# Patient Record
Sex: Female | Born: 1945 | Race: White | Hispanic: No | Smoking: Former smoker
Health system: Southern US, Community
[De-identification: ages and names within clinical notes are randomized; demographics above are authoritative.]

## PROBLEM LIST (undated history)

## (undated) DIAGNOSIS — Z950 Presence of cardiac pacemaker: Secondary | ICD-10-CM

## (undated) DIAGNOSIS — I4821 Permanent atrial fibrillation: Secondary | ICD-10-CM

## (undated) DIAGNOSIS — I10 Essential (primary) hypertension: Secondary | ICD-10-CM

## (undated) DIAGNOSIS — N181 Chronic kidney disease, stage 1: Secondary | ICD-10-CM

## (undated) DIAGNOSIS — I639 Cerebral infarction, unspecified: Secondary | ICD-10-CM

## (undated) DIAGNOSIS — G2581 Restless legs syndrome: Secondary | ICD-10-CM

## (undated) DIAGNOSIS — I739 Peripheral vascular disease, unspecified: Secondary | ICD-10-CM

## (undated) DIAGNOSIS — D72829 Elevated white blood cell count, unspecified: Secondary | ICD-10-CM

## (undated) DIAGNOSIS — I080 Rheumatic disorders of both mitral and aortic valves: Secondary | ICD-10-CM

## (undated) DIAGNOSIS — I509 Heart failure, unspecified: Secondary | ICD-10-CM

## (undated) HISTORY — DX: Restless legs syndrome: G25.81

## (undated) HISTORY — DX: Permanent atrial fibrillation: I48.21

## (undated) HISTORY — PX: TONSILLECTOMY: SUR1361

## (undated) HISTORY — DX: Essential (primary) hypertension: I10

## (undated) HISTORY — PX: MELANOMA EXCISION: SHX5266

## (undated) HISTORY — DX: Rheumatic disorders of both mitral and aortic valves: I08.0

## (undated) HISTORY — PX: OTHER SURGICAL HISTORY: SHX169

## (undated) HISTORY — DX: Elevated white blood cell count, unspecified: D72.829

## (undated) HISTORY — DX: Presence of cardiac pacemaker: Z95.0

## (undated) HISTORY — DX: Peripheral vascular disease, unspecified: I73.9

## (undated) HISTORY — PX: CHOLECYSTECTOMY: SHX55

## (undated) HISTORY — PX: APPENDECTOMY: SHX54

## (undated) HISTORY — DX: Chronic kidney disease, stage 1: N18.1

## (undated) HISTORY — PX: NASAL SINUS SURGERY: SHX719

## (undated) HISTORY — PX: INSERT / REPLACE / REMOVE PACEMAKER: SUR710

## (undated) HISTORY — PX: MITRAL VALVE REPLACEMENT: SHX147

## (undated) HISTORY — DX: Heart failure, unspecified: I50.9

---

## 2010-02-02 ENCOUNTER — Ambulatory Visit: Payer: Self-pay | Admitting: Family Medicine

## 2010-03-05 ENCOUNTER — Ambulatory Visit: Payer: Self-pay | Admitting: Family Medicine

## 2010-03-09 ENCOUNTER — Inpatient Hospital Stay: Payer: Self-pay | Admitting: *Deleted

## 2010-03-09 ENCOUNTER — Encounter: Payer: Self-pay | Admitting: Cardiovascular Disease

## 2010-03-13 ENCOUNTER — Ambulatory Visit: Payer: Self-pay | Admitting: *Deleted

## 2010-05-19 ENCOUNTER — Ambulatory Visit: Payer: Self-pay | Admitting: Cardiovascular Disease

## 2010-05-19 ENCOUNTER — Inpatient Hospital Stay: Payer: Self-pay | Admitting: Internal Medicine

## 2010-05-20 ENCOUNTER — Encounter: Payer: Self-pay | Admitting: Cardiovascular Disease

## 2010-05-27 ENCOUNTER — Ambulatory Visit: Payer: Self-pay | Admitting: Oncology

## 2010-06-08 ENCOUNTER — Ambulatory Visit: Payer: Self-pay | Admitting: Cardiovascular Disease

## 2010-06-08 DIAGNOSIS — I4891 Unspecified atrial fibrillation: Secondary | ICD-10-CM | POA: Insufficient documentation

## 2010-06-08 DIAGNOSIS — I050 Rheumatic mitral stenosis: Secondary | ICD-10-CM

## 2010-06-15 ENCOUNTER — Ambulatory Visit: Payer: Self-pay | Admitting: Oncology

## 2010-06-15 ENCOUNTER — Encounter: Payer: Self-pay | Admitting: Cardiovascular Disease

## 2010-06-26 ENCOUNTER — Ambulatory Visit: Payer: Self-pay | Admitting: Oncology

## 2010-08-18 ENCOUNTER — Emergency Department: Payer: Self-pay | Admitting: Emergency Medicine

## 2010-10-01 ENCOUNTER — Encounter: Payer: Self-pay | Admitting: Internal Medicine

## 2010-10-01 ENCOUNTER — Ambulatory Visit
Admission: RE | Admit: 2010-10-01 | Discharge: 2010-10-01 | Payer: Self-pay | Source: Home / Self Care | Attending: Internal Medicine | Admitting: Internal Medicine

## 2010-10-03 DIAGNOSIS — Z950 Presence of cardiac pacemaker: Secondary | ICD-10-CM | POA: Insufficient documentation

## 2010-10-26 NOTE — Letter (Signed)
Summary: [facility] Hematology Note   [facility] Hematology Note   Imported By: Roderic Ovens 07/12/2010 11:04:29  _____________________________________________________________________  External Attachment:    Type:   Image     Comment:   External Document

## 2010-10-26 NOTE — Letter (Signed)
Summary: PHI  PHI   Imported By: Harlon Flor 06/14/2010 13:37:50  _____________________________________________________________________  External Attachment:    Type:   Image     Comment:   External Document

## 2010-10-26 NOTE — Assessment & Plan Note (Signed)
Summary: NP6/AMD   Visit Type:  Initial Consult Primary Rhonda Griffith:  [name]  CC:  F/U [facility].  She is at a nursing home now for falling.Rhonda Griffith  History of Present Illness: [name] is a pleasant 65 year old woman with a history of chronic atrial fibrillation, pacemaker, mitral valve replacement with bovine valve who I first met in the hospital on August 26. She had a fall with recent history of falls with a very large hematoma on her left leg, history of recent GI bleeding, left arm fracture. At that time we decided to discontinue her Coumadin. Her INR at that time was more than 4.  She presents today for routine followup after discontinuation of her warfarin. Overall she states that she is doing well. Her hematoma is slowly improving bili continues to be quite large. She is ambulating with a walker her gait is still unstable. She denies any recent falls.  Notes indicate a history of sick sinus syndrome for which a pacemaker was placed. Also a history of rheumatic valve disease, status post bovine mitral valve replacement.  EKG today shows features fibrillation with paced rhythm ventricular rate of 85 beats per minute  Current Medications (verified): 1)  Requip 2 Mg Tabs (Ropinirole Hcl) .... One Tablet At Bedtime 2)  Flexeril 10 Mg Tabs (Cyclobenzaprine Hcl) .... Three Times A Day As Needed For Muscle Spasm. 3)  Zoloft 50 Mg Tabs (Sertraline Hcl) .... One Tablet Once Daily 4)  Cardizem Cd 300 Mg Xr24h-Cap (Diltiazem Hcl Coated Beads) .... One Tablet Once Daily 5)  Tylenol 325 Mg Tabs (Acetaminophen) .... As Needed 6)  Omeprazole 20 Mg Cpdr (Omeprazole) .... One Tablet Two Times A Day 7)  Ferrous Sulfate 325 (65 Fe) Mg Tbec (Ferrous Sulfate) .... One Tablet Once Daily 8)  Robitussin Cough/cold Cf 5-10-100 Mg/82ml Liqd (Phenylephrine-Dm-Gg) .... As Needed For Cough 9)  Septra Ds 800-160 Mg Tabs (Sulfamethoxazole-Trimethoprim) .... One Two Times A Day 10)  Diflucan 150mg  .... One Once  Daily 11)  Pyridium 200 Mg Tabs (Phenazopyridine Hcl) .... One Tablet As Needed  Allergies (verified): 1)  ! Morphine  Past History:  Past Medical History: Last updated: 06/08/2010 chronic A-fib on Coumadin rheumatic mitral valve, s/p mechanical valve replaced with bovine valve 2010 restless leg syndrome pacemaker implant for SSS leukocytosis chronic kidney disease, stage I  Past Surgical History: Last updated: 06/08/2010 Pacemaker Implant  rheumatic mitral valve, s/p mechanical valve replaced with bovine valve 2010.  Family History: Last updated: 06/08/2010 Family History of Hypertension:   Social History: Last updated: 06/08/2010 Married  Tobacco Use - Former.  Alcohol Use - no Drug Use - no  Risk Factors: Smoking Status: quit (06/08/2010)  Review of Systems       The patient complains of difficulty walking.  The patient denies fever, weight loss, weight gain, vision loss, decreased hearing, hoarseness, chest pain, syncope, dyspnea on exertion, peripheral edema, prolonged cough, abdominal pain, incontinence, muscle weakness, depression, and enlarged lymph nodes.    Vital Signs:  Patient profile:   65 year old female Height:      69 inches Weight:      180.25 pounds BMI:     26.71 Pulse rate:   85 / minute BP sitting:   154 / 75  (left arm) Cuff size:   regular  Vitals Entered By: Bishop Dublin, CMA (June 08, 2010 3:50 PM)  Physical Exam  General:  Well developed, well nourished, in no acute distress. Head:  normocephalic and atraumatic Neck:  Neck supple, no JVD. No masses, thyromegaly or abnormal cervical nodes. Lungs:  Clear bilaterally to auscultation and percussion. Heart:  Non-displaced PMI, chest non-tender; regular rate and rhythm, S1, S2 without murmurs, rubs or gallops. Carotid upstroke normal, no bruit.  Pedals normal pulses. No edema, no varicosities. Abdomen:   abdomen soft and non-tender without masses Msk:  Back normal, normal gait.  Muscle strength and tone normal. Walks with a walker Pulses:  pulses normal in all 4 extremities Extremities:  No clubbing or cyanosis. Neurologic:  Alert and oriented x 3. Skin:  Intact without lesions or rashes. Psych:  Normal affect.   Impression & Recommendations:  Problem # 1:  ATRIAL FIBRILLATION (ICD-427.31) chronic/permanent atrial fibrillation. She is no longer on warfarin secondary to frequent falls, large hematoma and hospitalization, GI bleed. we will start aspirin 81 mg daily. We will continue her on Cardizem as her rate is reasonably well controlled.  Her updated medication list for this problem includes:    Aspirin 81 Mg Tbec (Aspirin) .Rhonda Griffith... 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 We will repeat an echocardiogram annually to evaluate her valve. There is no indication for warfarin with a porcine valve.  Patient Instructions: 1)  Your physician has recommended you make the following change in your medication: START aspirin 81 mg daily  2)  Your physician wants you to follow-up in:   6 months You will receive a reminder letter in the mail two months in advance. If you don't receive a letter, please call our office to schedule the follow-up appointment.

## 2010-10-28 NOTE — Cardiovascular Report (Signed)
Summary: Office Visit   Office Visit   Imported By: Roderic Ovens 10/19/2010 13:45:08  _____________________________________________________________________  External Attachment:    Type:   Image     Comment:   External Document

## 2010-10-28 NOTE — Assessment & Plan Note (Signed)
Summary: NEP/AMD   Visit Type:  Initial Consult Referring Provider:  Tim Gollan,M.D. Primary Provider:  [name]  CC:  Est. Care with EP.  Denies chest pain or shortness of breath..  History of Present Illness: Mrs. Poteet is referred today for initial PPM evaluation.  She is a 65 yo woman with a h/o atrial fib, MV replacement X2, HTN, and recurrent falls. She fell and fractured her arm several months ago and currently has a hematoma. No other complaints. She denies palpitations.  Current Medications (verified): 1)  Requip 2 Mg Tabs (Ropinirole Hcl) .... One Tablet At Bedtime 2)  Flexeril 10 Mg Tabs (Cyclobenzaprine Hcl) .... Three Times A Day As Needed For Muscle Spasm. 3)  Zoloft 50 Mg Tabs (Sertraline Hcl) .... One Tablet Once Daily 4)  Cardizem Cd 300 Mg Xr24h-Cap (Diltiazem Hcl Coated Beads) .... One Tablet Once Daily 5)  Tylenol 325 Mg Tabs (Acetaminophen) .... As Needed 6)  Omeprazole 20 Mg Cpdr (Omeprazole) .... One Tablet Two Times A Day 7)  Ferrous Sulfate 325 (65 Fe) Mg Tbec (Ferrous Sulfate) .... One Tablet Once Daily 8)  Robitussin Cough/cold Cf 5-10-100 Mg/22ml Liqd (Phenylephrine-Dm-Gg) .... As Needed For Cough 9)  Septra Ds 800-160 Mg Tabs (Sulfamethoxazole-Trimethoprim) .... One Two Times A Day 10)  Diflucan 150mg  .... One Once Daily 11)  Pyridium 200 Mg Tabs (Phenazopyridine Hcl) .... One Tablet As Needed 12)  Aspirin 81 Mg Tbec (Aspirin) .... Take One Tablet By Mouth Daily  Allergies (verified): 1)  ! Morphine  Past History:  Past Medical History: Last updated: 06/08/2010 chronic A-fib on Coumadin rheumatic mitral valve, s/p mechanical valve replaced with bovine valve 2010 restless leg syndrome pacemaker implant for SSS leukocytosis chronic kidney disease, stage I  Past Surgical History: Last updated: 06/08/2010 Pacemaker Implant  rheumatic mitral valve, s/p mechanical valve replaced with bovine valve 2010.  Family History: Last updated:  06/08/2010 Family History of Hypertension:   Social History: Last updated: 06/08/2010 Married  Tobacco Use - Former.  Alcohol Use - no Drug Use - no  Risk Factors: Smoking Status: quit (06/08/2010)  Review of Systems       All systems reviewed and negative except as noted in the HPI.  Vital Signs:  Patient profile:   65 year old female Height:      69 inches Weight:      1170 pounds BMI:     173.40 Pulse rate:   85 / minute BP sitting:   120 / 72  (left arm) Cuff size:   regular  Vitals Entered By: Bishop Dublin, CMA (October 01, 2010 10:03 AM)  Physical Exam  General:  Well developed, well nourished, in no acute distress. Head:  normocephalic and atraumatic Neck:  Neck supple, no JVD. No masses, thyromegaly or abnormal cervical nodes. Chest Wall:  Well healed PPM incision. Lungs:  Clear bilaterally to auscultation with no wheezes, rales, or rhonchi. Heart:  Non-displaced PMI, chest non-tender; regular rate and rhythm, S1, S2 without murmurs, rubs or gallops. Carotid upstroke normal, no bruit.  Pedals normal pulses. No edema, no varicosities. Abdomen:   abdomen soft and non-tender without masses Msk:  Back normal, normal gait. Muscle strength and tone normal. Walks with a walker Pulses:  pulses normal in all 4 extremities Extremities:  No clubbing or cyanosis. Neurologic:  Alert and oriented x 3.   PPM Specifications Following MD:  Lewayne Bunting, MD     Referring MD:  Georgeanna Lea PPM Vendor:  [employer]  PPM Model Number:  [id number]     PPM Serial Number:  [id number] PPM DOI:  12/11/2007     PPM Implanting MD:  NOT IMPLANTED BY [place]  Lead 1    Location: RV     DOI: 12/11/2007     Model #: [id number]     Serial #: [id number]     Status: active  Magnet Response Rate:  BOL 85 ERI 65  Indications:  Sick sinus syndrome   PPM Follow Up Remote Check?  No Battery Voltage:  2.79 V     Battery Est. Longevity:  8 years     Right Ventricle  Amplitude: 8.0 mV, Impedance: 331  ohms, Threshold: 0.5 V at 0.4 msec  Episodes Coumadin:  No Ventricular Pacing:  90.5%  Parameters Mode:  VVIR     Lower Rate Limit:  60     Upper Rate Limit:  120 Next Cardiology Appt Due:  03/27/2011 Tech Comments:  No parameter changes.  A-fib with controlled ventricular response.  She is not on coumadin because of her risk for fall.  Device function normal.  No Carelink @ this time.  ROV 6 months [place] [facility]. Altha Harm, LPN  October 01, 2010 10:18 AM  MD Comments:  Agree with above.  Impression & Recommendations:  Problem # 1:  CARDIAC PACEMAKER IN SITU (ICD-V45.01) Her PPM is working normally. Continue followup in several months.  Problem # 2:  ATRIAL FIBRILLATION (ICD-427.31) Her ventricular rates are well controlled. Will follow. Her updated medication list for this problem includes:    Aspirin 81 Mg Tbec (Aspirin) .[name]... Take one tablet by mouth daily  Patient Instructions: 1)  Your physician recommends that you schedule a follow-up appointment in: 1 year 2)  Your physician recommends that you continue on your current medications as directed. Please refer to the Current Medication list given to you today.

## 2010-12-11 ENCOUNTER — Ambulatory Visit: Payer: Self-pay | Admitting: Internal Medicine

## 2010-12-29 ENCOUNTER — Ambulatory Visit (INDEPENDENT_AMBULATORY_CARE_PROVIDER_SITE_OTHER): Payer: Medicare Other | Admitting: Cardiovascular Disease

## 2010-12-29 ENCOUNTER — Encounter: Payer: Self-pay | Admitting: Cardiovascular Disease

## 2010-12-29 DIAGNOSIS — Z950 Presence of cardiac pacemaker: Secondary | ICD-10-CM

## 2010-12-29 DIAGNOSIS — I4891 Unspecified atrial fibrillation: Secondary | ICD-10-CM

## 2010-12-29 DIAGNOSIS — I050 Rheumatic mitral stenosis: Secondary | ICD-10-CM

## 2010-12-29 MED ORDER — WARFARIN SODIUM 2 MG PO TABS: 1.0000 mg | ORAL_TABLET | Freq: Every day | ORAL | Status: DC

## 2010-12-29 NOTE — Assessment & Plan Note (Signed)
Chronic atrial fibrillation, paced rhythm. We have talked to her again about anticoagulation. As she has had no falls, I have encouraged her to start a low dose warfarin. Previously, she was on 2.5 mg alternating with 5 mg several times a week. We could start with 1 mg a day, titrating up slowly with goal INR of 1.8-2. She would like to start slowly given her history of falls. No INR check would be needed with 1 mg daily.

## 2010-12-29 NOTE — Progress Notes (Signed)
   Patient ID: Rhonda Griffith, female    DOB: Nov 26, 1945, 65 y.o.   MRN: [id number]  HPI Comments: Rhonda Griffith is a pleasant 65 year old woman with a history of chronic atrial fibrillation, pacemaker, history of rheumatic valve disease with mitral valve replacement with bovine valve who I first met in the hospital on August 26. She had a fall with recent history of falls with a very large hematoma on her left leg, history of recent GI bleeding, left arm fracture. At that time we decided to discontinue her Coumadin. Her INR at that time was more than 4. Notes indicate a history of sick sinus syndrome for which a pacemaker was placed.    She presents today for routine followup. She has been having followup for her poor balance and coordination issues. She had a spinal tap at [facility] for high pressure in her ventricle. She reports no significant improvement after this procedure.  She is scheduled to have a followup with a neurologist who specializes in motor coordination. She uses her walker. She continues to be a fall risk though no recent falls per her family. She does have some memory problems and problems with incontinence.   EKG today shows features atrial  fibrillation with paced rhythm ventricular rate of 73 beats per minute      Review of Systems  HENT: Negative.   Eyes: Negative.   Respiratory: Negative.   Cardiovascular: Negative.   Gastrointestinal: Negative.   Musculoskeletal: Negative.   Skin: Negative.   Neurological: Positive for weakness.       Problems with motor coordination and gait.  Hematological: Negative.   Psychiatric/Behavioral: Negative.   All other systems reviewed and are negative.    BP 120/72  Pulse 73  Ht 5\' 9"  (1.753 m)  Wt 176 lb (79.833 kg)  BMI 25.99 kg/m2   Physical Exam  Nursing note and vitals reviewed. Constitutional: She is oriented to person, place, and time. She appears well-developed and well-nourished.  HENT:  Head: Normocephalic.    Nose: Nose normal.  Mouth/Throat: Oropharynx is clear and moist.  Eyes: Conjunctivae are normal. Pupils are equal, round, and reactive to light.  Neck: Normal range of motion. Neck supple. No JVD present.  Cardiovascular: Normal rate, regular rhythm, normal heart sounds and intact distal pulses.  Exam reveals no gallop and no friction rub.   No murmur heard. Pulmonary/Chest: Effort normal and breath sounds normal. No respiratory distress. She has no wheezes. She has no rales. She exhibits no tenderness.  Abdominal: Soft. Bowel sounds are normal. She exhibits no distension. There is no tenderness.  Musculoskeletal: Normal range of motion. She exhibits no edema and no tenderness.  Lymphadenopathy:    She has no cervical adenopathy.  Neurological: She is alert and oriented to person, place, and time. Coordination abnormal.  Skin: Skin is warm and dry. No rash noted. No erythema.  Psychiatric: She has a normal mood and affect. Her behavior is normal. Judgment and thought content normal.         Assessment and Plan

## 2010-12-29 NOTE — Patient Instructions (Signed)
RESTART Warfarin 1 mg once daily.  You were given samples today in the office for Warfarin 2 mg, take 1/2 tablet daily. Follow up with Dr. Arville Go for your INR. Please notify our office if you will need a prescription for Warfarin. Continue remaining medications as normal. Your physician recommends that you schedule a follow-up appointment in: 6 months

## 2010-12-29 NOTE — Assessment & Plan Note (Signed)
History of bioprosthetic mitral valve. We'll plan a repeat echocardiogram next year.

## 2010-12-29 NOTE — Assessment & Plan Note (Signed)
Pacemaker followed by Dr. Taylor 

## 2011-01-10 ENCOUNTER — Other Ambulatory Visit: Payer: Self-pay | Admitting: Cardiovascular Disease

## 2011-01-10 MED ORDER — DILTIAZEM HCL ER BEADS 180 MG PO CP24: 180.0000 mg | ORAL_CAPSULE | Freq: Every day | ORAL | Status: DC

## 2011-04-20 ENCOUNTER — Encounter: Payer: No Typology Code available for payment source | Admitting: *Deleted

## 2011-05-14 DIAGNOSIS — F411 Generalized anxiety disorder: Secondary | ICD-10-CM | POA: Insufficient documentation

## 2011-05-14 DIAGNOSIS — I272 Pulmonary hypertension, unspecified: Secondary | ICD-10-CM | POA: Insufficient documentation

## 2011-05-14 DIAGNOSIS — K219 Gastro-esophageal reflux disease without esophagitis: Secondary | ICD-10-CM | POA: Insufficient documentation

## 2011-05-14 DIAGNOSIS — IMO0001 Reserved for inherently not codable concepts without codable children: Secondary | ICD-10-CM | POA: Insufficient documentation

## 2011-05-14 DIAGNOSIS — Z952 Presence of prosthetic heart valve: Secondary | ICD-10-CM | POA: Insufficient documentation

## 2011-05-14 DIAGNOSIS — M1990 Unspecified osteoarthritis, unspecified site: Secondary | ICD-10-CM | POA: Insufficient documentation

## 2011-05-14 DIAGNOSIS — G4733 Obstructive sleep apnea (adult) (pediatric): Secondary | ICD-10-CM | POA: Insufficient documentation

## 2011-05-25 ENCOUNTER — Ambulatory Visit (INDEPENDENT_AMBULATORY_CARE_PROVIDER_SITE_OTHER): Payer: Medicare Other | Admitting: *Deleted

## 2011-05-25 DIAGNOSIS — I495 Sick sinus syndrome: Secondary | ICD-10-CM

## 2011-05-26 ENCOUNTER — Encounter: Payer: Self-pay | Admitting: Cardiology

## 2011-05-26 ENCOUNTER — Ambulatory Visit (INDEPENDENT_AMBULATORY_CARE_PROVIDER_SITE_OTHER): Payer: Medicare Other | Admitting: Cardiology

## 2011-05-26 ENCOUNTER — Emergency Department: Payer: Self-pay | Admitting: Emergency Medicine

## 2011-05-26 DIAGNOSIS — I4891 Unspecified atrial fibrillation: Secondary | ICD-10-CM

## 2011-05-26 DIAGNOSIS — I059 Rheumatic mitral valve disease, unspecified: Secondary | ICD-10-CM

## 2011-05-26 DIAGNOSIS — I509 Heart failure, unspecified: Secondary | ICD-10-CM

## 2011-05-26 DIAGNOSIS — R0602 Shortness of breath: Secondary | ICD-10-CM

## 2011-05-26 DIAGNOSIS — I5032 Chronic diastolic (congestive) heart failure: Secondary | ICD-10-CM

## 2011-05-26 NOTE — Patient Instructions (Signed)
We will call you with lab results.  Your physician has requested that you have an echocardiogram. Echocardiography is a painless test that uses sound waves to create images of your heart. It provides your doctor with information about the size and shape of your heart and how well your heart's chambers and valves are working. This procedure takes approximately one hour. There are no restrictions for this procedure.  Your physician recommends that you schedule a follow-up appointment in: 10 days with Dr. Mariah Milling (after echo )

## 2011-05-27 DIAGNOSIS — I5032 Chronic diastolic (congestive) heart failure: Secondary | ICD-10-CM | POA: Insufficient documentation

## 2011-05-27 LAB — BASIC METABOLIC PANEL
Calcium: 9.9 mg/dL (ref 8.4–10.5)
Creat: 1.5 mg/dL — ABNORMAL HIGH (ref 0.50–1.10)
Sodium: 140 mEq/L (ref 135–145)

## 2011-05-27 LAB — BRAIN NATRIURETIC PEPTIDE: Brain Natriuretic Peptide: 73.2 pg/mL (ref 0.0–100.0)

## 2011-05-27 NOTE — Progress Notes (Signed)
PCP: Dr. Vincenza Hews  65 yo with history of chronic atrial fibrillation, rheumatic heart disease s/p bioprosthetic MV replacement, and early dementia presents for evaluation of lower extremity edema.  She is usually followed by Dr. Mariah Milling and is seen by me today as an add-on.  Patient was in her usual state of health until about a week ago when she developed lower extremity edema. 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  She can walk in her house without dyspnea but does get short of breath climbing a flight of steps.  This has not changed recently.  No chest pain.  She denies any dietary changes (increased sodium consumption).  She is taking diltiazem but has been on the same dose long-term.  She recently started [place] for her movement disorder, but fluid retention does not appear to be one of its side effects.  Symptomatically, she has felt the same.  She was started on Lasix 20 mg daily by her PCP.  The swelling has started to go down.  Her weight is indeed up 9 lbs since the prior appointment.    ECG: atrial fibrillation with V-pacing  PMH: 1. Chronic atrial fibrillation 2. Rheumatic heart disease s/p mitral valve replacement with bioprosthetic mitral valve.   3. Sick sinus syndrome with pacemaker 4. 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  5. Movement disorder (chorea)  SH: Lives in [place], nonsmoker, nondrinker  FH: Noncontributory.   ROS: All systems reviewed and negative except as per HPI.   Current Outpatient Prescriptions  Medication Sig Dispense Refill  . amitriptyline (ELAVIL) 50 MG tablet Take 50 mg by mouth at bedtime.        [name] aspirin 81 MG EC tablet Take 81 mg by mouth daily.        . Cholecalciferol (VITAMIN D3) 1000 UNITS tablet Take 1,000 Units by mouth daily. Take twice a month.      . cyclobenzaprine (FLEXERIL) 10 MG tablet Take 10 mg by mouth at bedtime.       . diclofenac sodium  (VOLTAREN) 1 % GEL Apply 1 application topically 2 (two) times daily.        [name] diltiazem (TIAZAC) 180 MG 24 hr capsule Take 1 capsule (180 mg total) by mouth daily.  30 capsule  6  . fluconazole (DIFLUCAN) 150 MG tablet Take 150 mg by mouth once.        . magnesium oxide (MAG-OX) 400 MG tablet Take 400 mg by mouth 2 (two) times daily.       . Multiple Vitamin (MULTIVITAMIN) tablet Take 1 tablet by mouth daily.        . nitrofurantoin (MACRODANTIN) 50 MG capsule Take 50 mg by mouth 1 day or 1 dose.        [name] omeprazole (PRILOSEC) 20 MG capsule Take 20 mg by mouth daily.        [name] oxyCODONE-acetaminophen (PERCOCET) 5-325 MG per tablet Take 1 tablet by mouth every 4 (four) hours as needed.        . phenazopyridine (PYRIDIUM) 200 MG tablet Take 200 mg by mouth 3 (three) times daily as needed.        . ramipril (ALTACE) 2.5 MG tablet Take 2.5 mg by mouth daily.        [name] rOPINIRole (REQUIP XL) 2 MG 24 hr tablet Take 2 mg by mouth at bedtime.        . sertraline (ZOLOFT)  50 MG tablet Take 100 mg by mouth daily. 2 tablets every morning      . simvastatin (ZOCOR) 20 MG tablet Take 20 mg by mouth at bedtime.        . sodium bicarbonate 650 MG tablet Take 650 mg by mouth 4 (four) times daily.        [name] tetrabenazine (XENAZINE) 12.5 MG tablet Take 25 mg by mouth 2 (two) times daily. 2 po bid       . warfarin (COUMADIN) 2 MG tablet Take 0.5 tablets (1 mg total) by mouth daily.  30 tablet  0    BP 135/78  Pulse 73  Ht 5\' 9"  (1.753 m)  Wt 187 lb (84.823 kg)  BMI 27.62 kg/m2 General: NAD, chronically ill-appearing Neck: JVP 7 cm, no thyromegaly or thyroid nodule.  Lungs: Clear to auscultation bilaterally with normal respiratory effort. CV: Nondisplaced PMI.  Heart regular S1/S2, no S3/S4, 1/6 systolic murmur at base.  1+ ankle edema.  No carotid bruit.   Abdomen: Soft, nontender, no hepatosplenomegaly, no distention.  Neurologic: Alert and oriented x 3.  Psych: Normal affect. Extremities: No clubbing or  cyanosis.

## 2011-05-27 NOTE — Assessment & Plan Note (Signed)
Patient has mild peripheral edema.  Neck veins are not significantly elevated.  She has been started on Lasix earlier this week by her PCP with improvement in lower extremity edema.  I am not sure what triggered her volume retention: she denies any increase in her chronic mild dyspnea.  She is on a calcium channel blocker but has been taking it chronically.  Her only new medicine is [place] which does not appear to cause fluid retention.  She denies dietary changes or chest pain.   - Continue Lasix 20 mg daily.  - Echocardiogram to assess LV and RV function as well as the prosthetic valve.  - BMET/BNP today.

## 2011-05-27 NOTE — Assessment & Plan Note (Signed)
Chronic atrial fibrillation on warfarin.  She is a fall risk given her movement disorder and will need to be very careful.

## 2011-05-31 ENCOUNTER — Other Ambulatory Visit (INDEPENDENT_AMBULATORY_CARE_PROVIDER_SITE_OTHER): Payer: Medicare Other | Admitting: *Deleted

## 2011-05-31 DIAGNOSIS — I5032 Chronic diastolic (congestive) heart failure: Secondary | ICD-10-CM

## 2011-05-31 DIAGNOSIS — I059 Rheumatic mitral valve disease, unspecified: Secondary | ICD-10-CM

## 2011-06-06 ENCOUNTER — Encounter: Payer: Self-pay | Admitting: Orthopaedic Surgery

## 2011-06-07 ENCOUNTER — Ambulatory Visit (INDEPENDENT_AMBULATORY_CARE_PROVIDER_SITE_OTHER): Payer: Medicare Other | Admitting: Cardiovascular Disease

## 2011-06-07 ENCOUNTER — Encounter: Payer: Self-pay | Admitting: Cardiovascular Disease

## 2011-06-07 DIAGNOSIS — R609 Edema, unspecified: Secondary | ICD-10-CM

## 2011-06-07 DIAGNOSIS — I4891 Unspecified atrial fibrillation: Secondary | ICD-10-CM

## 2011-06-07 DIAGNOSIS — I5032 Chronic diastolic (congestive) heart failure: Secondary | ICD-10-CM

## 2011-06-07 DIAGNOSIS — I509 Heart failure, unspecified: Secondary | ICD-10-CM

## 2011-06-07 NOTE — Patient Instructions (Signed)
You are doing well. No medication changes were made. Please call us if you have new issues that need to be addressed before your next appt.  We will call you for a follow up Appt. In 6 months  

## 2011-06-07 NOTE — Progress Notes (Signed)
Patient ID: Rhonda Griffith, female    DOB: 11-03-1945, 65 y.o.   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 At that time we decided to discontinue her Coumadin. Her INR at that time was more than 4. Notes indicate a history of sick sinus syndrome for which a pacemaker was placed.    She presents today for routine followup. She continues to have  poor balance and coordination issues. She had a spinal tap at [facility] for high pressure in her ventricle. She reports no significant improvement after this procedure (drain?).She is scheduled to work with physical therapy for her leg strength. She had a recent fall onto her left knee and hit her head. She presented to the emergency room and was found to have no intracranial bleed, a large ecchymotic bruise on her left knee. She reports that her INR level has been running at 2 or slightly less. When she fell, she did not use her walker. Now she uses her walker all the time   Old EKG  shows features atrial  fibrillation with paced rhythm ventricular rate of 73 beats per minute   Outpatient Encounter Prescriptions as of 06/07/2011  Medication Sig Dispense Refill  . amitriptyline (ELAVIL) 50 MG tablet Take 50 mg by mouth at bedtime.        [name] aspirin 81 MG EC tablet Take 81 mg by mouth daily.        . Cholecalciferol (VITAMIN D3) 1000 UNITS tablet Take 1,000 Units by mouth daily. Take twice a month.      . cyclobenzaprine (FLEXERIL) 10 MG tablet Take 10 mg by mouth at bedtime.       . diclofenac sodium (VOLTAREN) 1 % GEL Apply 1 application topically 2 (two) times daily.        [name] diltiazem (TIAZAC) 180 MG 24 hr capsule Take 1 capsule (180 mg total) by mouth daily.  30  capsule  6  . magnesium oxide (MAG-OX) 400 MG tablet Take 400 mg by mouth 2 (two) times daily.       . Multiple Vitamin (MULTIVITAMIN) tablet Take 1 tablet by mouth daily.        [name] omeprazole (PRILOSEC) 20 MG capsule Take 20 mg by mouth daily.        [name] oxyCODONE-acetaminophen (PERCOCET) 5-325 MG per tablet Take 1 tablet by mouth every 4 (four) hours as needed.        . phenazopyridine (PYRIDIUM) 200 MG tablet Take 200 mg by mouth 3 (three) times daily as needed.        . ramipril (ALTACE) 2.5 MG tablet Take 2.5 mg by mouth daily.        [name] rOPINIRole (REQUIP XL) 2 MG 24 hr tablet Take 2 mg by mouth at bedtime.        . sertraline (ZOLOFT) 50 MG tablet Take 100 mg by mouth daily. 2 tablets every morning      . simvastatin (ZOCOR) 20 MG tablet Take 20 mg by mouth at bedtime.        . sodium bicarbonate 650 MG tablet Take 650 mg by mouth 4 (four) times daily.        [name] tetrabenazine (XENAZINE) 12.5  MG tablet Take 25 mg by mouth 2 (two) times daily. 2 po bid       . warfarin (COUMADIN) 2 MG tablet Take 0.5 tablets (1 mg total) by mouth daily.  30 tablet  0  . fluconazole (DIFLUCAN) 150 MG tablet Take 150 mg by mouth once.        . nitrofurantoin (MACRODANTIN) 50 MG capsule Take 50 mg by mouth 1 day or 1 dose.            Review of Systems  HENT: Negative.   Eyes: Negative.   Respiratory: Negative.   Cardiovascular: Negative.   Gastrointestinal: Negative.   Musculoskeletal: Positive for joint swelling and gait problem.  Skin: Negative.   Neurological: Positive for weakness.       Problems with motor coordination and gait.  Hematological: Negative.   Psychiatric/Behavioral: Negative.   All other systems reviewed and are negative.    BP 152/83  Pulse 87  Ht 5\' 9"  (1.753 m)  Wt 188 lb (85.276 kg)  BMI 27.76 kg/m2   Physical Exam  Nursing note and vitals reviewed. Constitutional: She is oriented to person, place, and time. She appears well-developed and well-nourished.  HENT:  Head:  Normocephalic.  Nose: Nose normal.  Mouth/Throat: Oropharynx is clear and moist.  Eyes: Conjunctivae are normal. Pupils are equal, round, and reactive to light.  Neck: Normal range of motion. Neck supple. No JVD present.  Cardiovascular: Normal rate, regular rhythm, S1 normal, S2 normal, normal heart sounds and intact distal pulses.  Exam reveals no gallop and no friction rub.   No murmur heard. Pulmonary/Chest: Effort normal and breath sounds normal. No respiratory distress. She has no wheezes. She has no rales. She exhibits no tenderness.  Abdominal: Soft. Bowel sounds are normal. She exhibits no distension. There is no tenderness.  Musculoskeletal: Normal range of motion. She exhibits no edema and no tenderness.  Lymphadenopathy:    She has no cervical adenopathy.  Neurological: She is alert and oriented to person, place, and time. Coordination abnormal.  Skin: Skin is warm and dry. No rash noted. No erythema.  Psychiatric: She has a normal mood and affect. Her behavior is normal. Judgment and thought content normal.         Assessment and Plan

## 2011-06-07 NOTE — Assessment & Plan Note (Signed)
Recent echocardiogram shows normal LV systolic function. She does have mildly elevated right ventricular systolic pressures as of last week. Her edema has improved with Lasix p.r.n. For her creatinine and BUN. Are mildly elevated I suspect we will need to use Lasix p.r.n. For edema only and not on a regular basis.

## 2011-06-07 NOTE — Assessment & Plan Note (Signed)
Edema has improved on Lasix. She currently uses it p.r.n. Only. Mildly elevated right ventricular systolic pressures estimated at 40 mm of mercury seen on echo.

## 2011-06-07 NOTE — Assessment & Plan Note (Signed)
She continues on warfarin though she does have a history of recent falls. We have suggested that she call us if she has additional falls as we could potentially stop her warfarin.

## 2011-06-28 ENCOUNTER — Emergency Department: Payer: Self-pay | Admitting: Emergency Medicine

## 2011-06-29 ENCOUNTER — Encounter: Payer: Self-pay | Admitting: Orthopaedic Surgery

## 2011-07-28 ENCOUNTER — Encounter: Payer: Self-pay | Admitting: Orthopaedic Surgery

## 2011-08-08 DIAGNOSIS — G259 Extrapyramidal and movement disorder, unspecified: Secondary | ICD-10-CM | POA: Insufficient documentation

## 2011-08-27 ENCOUNTER — Encounter: Payer: Self-pay | Admitting: Orthopaedic Surgery

## 2011-08-29 ENCOUNTER — Telehealth: Payer: Self-pay

## 2011-08-29 MED ORDER — DILTIAZEM HCL ER BEADS 180 MG PO CP24: 180.0000 mg | ORAL_CAPSULE | Freq: Every day | ORAL | Status: DC

## 2011-08-29 NOTE — Telephone Encounter (Signed)
Refill sent for diltiazem er 180 mg take one tablet daily.

## 2011-09-27 ENCOUNTER — Encounter: Payer: Self-pay | Admitting: Orthopaedic Surgery

## 2011-11-22 ENCOUNTER — Ambulatory Visit (INDEPENDENT_AMBULATORY_CARE_PROVIDER_SITE_OTHER): Payer: Medicare Other | Admitting: Internal Medicine

## 2011-11-22 ENCOUNTER — Encounter: Payer: Self-pay | Admitting: Internal Medicine

## 2011-11-22 DIAGNOSIS — I509 Heart failure, unspecified: Secondary | ICD-10-CM

## 2011-11-22 DIAGNOSIS — I5032 Chronic diastolic (congestive) heart failure: Secondary | ICD-10-CM

## 2011-11-22 DIAGNOSIS — I4891 Unspecified atrial fibrillation: Secondary | ICD-10-CM

## 2011-11-22 DIAGNOSIS — I495 Sick sinus syndrome: Secondary | ICD-10-CM | POA: Insufficient documentation

## 2011-11-22 DIAGNOSIS — Z950 Presence of cardiac pacemaker: Secondary | ICD-10-CM

## 2011-11-22 LAB — PACEMAKER DEVICE OBSERVATION
BMOD-0005RV: 95 {beats}/min
BRDY-0002RV: 60 {beats}/min
BRDY-0004RV: 120 {beats}/min
RV LEAD AMPLITUDE: 11.2 mv
RV LEAD IMPEDENCE PM: 468 Ohm
RV LEAD THRESHOLD: 0.5 V

## 2011-11-22 NOTE — Patient Instructions (Signed)
Your physician recommends that you schedule a follow-up appointment in: 6 MONTHS FOR A DEVICE CHECK  Your physician wants you to follow-up in: 1 YEAR WITH DR. Ladona Ridgel IN THE [place] OFFICE. You will receive a reminder letter in the mail two months in advance. If you don't receive a letter, please call our office to schedule the follow-up appointment.  NO CHANGES TODAY

## 2011-11-22 NOTE — Progress Notes (Signed)
HPI Rhonda Griffith returns today for followup. She is a very pleasant 66 year old woman with chronic atrial fibrillation and symptomatic bradycardia status post pacemaker insertion. She had a remote history of falls and initially had her warfarin stopped. However, after going a year without falling Dr. Lewie Loron appropriately restarted her warfarin. She has been stable. She admits to dietary indiscretion. She is gaining weight. She denies chest pain, shortness of breath, or peripheral edema. She is considering a urologic procedure for incontinence. Allergies  Allergen Reactions  . Morphine      Current Outpatient Prescriptions  Medication Sig Dispense Refill  . amitriptyline (ELAVIL) 50 MG tablet Take 50 mg by mouth at bedtime.        [name] aspirin 81 MG EC tablet Take 81 mg by mouth daily.        . Cholecalciferol (VITAMIN D3) 1000 UNITS tablet Take 1,000 Units by mouth daily. Take twice a month.      . cyclobenzaprine (FLEXERIL) 10 MG tablet Take 10 mg by mouth at bedtime.       . diclofenac sodium (VOLTAREN) 1 % GEL Apply 1 application topically 2 (two) times daily.        [name] diltiazem (TIAZAC) 180 MG 24 hr capsule Take 1 capsule (180 mg total) by mouth daily.  30 capsule  6  . fluconazole (DIFLUCAN) 150 MG tablet Take 150 mg by mouth once.        . magnesium oxide (MAG-OX) 400 MG tablet Take 400 mg by mouth 2 (two) times daily.       . Multiple Vitamin (MULTIVITAMIN) tablet Take 1 tablet by mouth daily.        . nitrofurantoin (MACRODANTIN) 50 MG capsule Take 50 mg by mouth 1 day or 1 dose.        [name] omeprazole (PRILOSEC) 20 MG capsule Take 20 mg by mouth daily.        [name] oxyCODONE-acetaminophen (PERCOCET) 5-325 MG per tablet Take 1 tablet by mouth every 4 (four) hours as needed.        . phenazopyridine (PYRIDIUM) 200 MG tablet Take 200 mg by mouth 3 (three) times daily as needed.        . ramipril (ALTACE) 2.5 MG tablet Take 2.5 mg by mouth daily.        [name] rOPINIRole (REQUIP XL) 2 MG 24 hr tablet  Take 2 mg by mouth at bedtime.        . sertraline (ZOLOFT) 50 MG tablet Take 100 mg by mouth daily. 2 tablets every morning      . simvastatin (ZOCOR) 20 MG tablet Take 20 mg by mouth at bedtime.        . sodium bicarbonate 650 MG tablet Take 650 mg by mouth 4 (four) times daily.        [name] tetrabenazine (XENAZINE) 12.5 MG tablet Take 25 mg by mouth 2 (two) times daily. 2 po bid       . warfarin (COUMADIN) 2 MG tablet Take 0.5 tablets (1 mg total) by mouth daily.  30 tablet  0     Past Medical History  Diagnosis Date  . Arrhythmia     chronic A-Fib  . Restless leg syndrome   . Pacemaker     implant for SSS  . 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 Leukocytosis   . Chronic kidney disease, stage I   . Sinoatrial node dysfunction  ROS:   All systems reviewed and negative except as noted in the HPI.   Past Surgical History  Procedure Date  . Insert / replace / remove pacemaker     implant for SSS  . Mitral valve replacement     s/p mechanical valve replaced with bovine valve 2010     History reviewed. No pertinent family history.   History   Social History  . Marital Status: Married    Spouse Name: N/A    Number of Children: N/A  . Years of Education: N/A   Occupational History  . Not on file.   Social History Main Topics  . Smoking status: Former Smoker -- 1.0 packs/day for 5 years    Types: Cigarettes    Quit date: 12/29/1970  . Smokeless tobacco: Not on file  . Alcohol Use: No  . Drug Use: No  . Sexually Active: Not on file   Other Topics Concern  . Not on file   Social History Narrative  . No narrative on file     BP 130/72  Pulse 76  Ht 5\' 9"  (1.753 m)  Wt 85.73 kg (189 lb)  BMI 27.91 kg/m2  Physical Exam:  Well appearing middle-aged woman, NAD HEENT: Unremarkable Neck:  No JVD, no thyromegally Lymphatics:  No adenopathy Back:  No CVA tenderness Lungs:  Clear with  no wheezes, rales, or rhonchi. HEART:  IRegular rate rhythm, no murmurs, no rubs, no clicks Abd:  soft, positive bowel sounds, no organomegally, no rebound, no guarding Ext:  2 plus pulses, no edema, no cyanosis, no clubbing Skin:  No rashes no nodules Neuro:  CN II through XII intact, motor grossly intact  DEVICE  Normal device function.  See PaceArt for details.   Assess/Plan:

## 2011-11-22 NOTE — Assessment & Plan Note (Signed)
Her ventricular rate appears to be well-controlled. She will continue her current medical therapy. 

## 2011-11-22 NOTE — Assessment & Plan Note (Signed)
Her current symptoms are class 1-2. I've asked the patient to maintain a low-sodium diet. She will continue her current medical therapy.

## 2011-11-22 NOTE — Assessment & Plan Note (Signed)
Her device is working normally. We'll recheck in several months. 

## 2011-11-28 DIAGNOSIS — G5602 Carpal tunnel syndrome, left upper limb: Secondary | ICD-10-CM | POA: Insufficient documentation

## 2011-12-06 ENCOUNTER — Ambulatory Visit: Payer: Medicare Other | Admitting: Cardiovascular Disease

## 2011-12-21 DIAGNOSIS — IMO0002 Reserved for concepts with insufficient information to code with codable children: Secondary | ICD-10-CM | POA: Insufficient documentation

## 2011-12-27 ENCOUNTER — Encounter: Payer: Self-pay | Admitting: Internal Medicine

## 2011-12-28 ENCOUNTER — Ambulatory Visit: Payer: Medicare Other | Admitting: Cardiovascular Disease

## 2012-01-10 ENCOUNTER — Ambulatory Visit: Payer: Medicare Other | Admitting: Cardiovascular Disease

## 2012-01-24 DIAGNOSIS — S83419A Sprain of medial collateral ligament of unspecified knee, initial encounter: Secondary | ICD-10-CM | POA: Insufficient documentation

## 2012-01-26 ENCOUNTER — Ambulatory Visit (INDEPENDENT_AMBULATORY_CARE_PROVIDER_SITE_OTHER): Payer: Medicare Other | Admitting: Internal Medicine

## 2012-01-26 ENCOUNTER — Encounter: Payer: Self-pay | Admitting: Internal Medicine

## 2012-01-26 VITALS — BP 128/74 | Ht 69.0 in | Wt 184.5 lb

## 2012-01-26 DIAGNOSIS — I509 Heart failure, unspecified: Secondary | ICD-10-CM

## 2012-01-26 DIAGNOSIS — I5032 Chronic diastolic (congestive) heart failure: Secondary | ICD-10-CM

## 2012-01-26 DIAGNOSIS — I495 Sick sinus syndrome: Secondary | ICD-10-CM

## 2012-01-26 DIAGNOSIS — I4891 Unspecified atrial fibrillation: Secondary | ICD-10-CM

## 2012-01-26 DIAGNOSIS — Z950 Presence of cardiac pacemaker: Secondary | ICD-10-CM

## 2012-01-26 LAB — PACEMAKER DEVICE OBSERVATION
BATTERY VOLTAGE: 2.78 V
BMOD-0004RV: 10
BRDY-0004RV: 120 {beats}/min
RV LEAD THRESHOLD: 1 V
VENTRICULAR PACING PM: 87

## 2012-01-26 NOTE — Assessment & Plan Note (Signed)
Her chronic diastolic heart failure is currently well compensated. She'll continue her current medical therapy and maintain a low-sodium diet.

## 2012-01-26 NOTE — Patient Instructions (Signed)
Follow up with Gunnar Fusi in 6 months. If you have any questions, you may contact Paula at [phone].  You may proceed with the Urologist evaluation and device implant per Dr. Ladona Ridgel. If you have any further questions please contact our office at [phone].

## 2012-01-26 NOTE — Assessment & Plan Note (Signed)
The patient's ventricular rate is well controlled. She will continue anticoagulation.

## 2012-01-26 NOTE — Progress Notes (Signed)
HPI Rhonda Griffith returns today for followup. She is a very pleasant 66 year old woman with a history of symptomatic bradycardia, status post permanent pacemaker insertion. She has chronic atrial fibrillation and is status post aortic and mitral valve replacement. She is on warfarin therapy. She also has hypertension. The patient has been stable but is in the process of undergoing urologic evaluation for incontinence. She is considering having a device implanted to help improve her continence. The patient has otherwise been stable, denying chest pain or shortness of breath. Allergies  Allergen Reactions  . Morphine   . Tape     Allergic to plastic/latex tape. Only use paper tape on patient.     Current Outpatient Prescriptions  Medication Sig Dispense Refill  . aspirin 81 MG EC tablet Take 81 mg by mouth daily.        . Cholecalciferol (VITAMIN D3) 1000 UNITS tablet Take 1,000 Units by mouth daily. Take twice a month.      . cyclobenzaprine (FLEXERIL) 10 MG tablet Take 10 mg by mouth at bedtime.       . diclofenac sodium (VOLTAREN) 1 % GEL Apply 1 application topically 2 (two) times daily.        [name] diltiazem (TIAZAC) 180 MG 24 hr capsule Take 1 capsule (180 mg total) by mouth daily.  30 capsule  6  . Docusate Calcium (STOOL SOFTENER PO) Take 1 tablet by mouth 2 (two) times daily.      . magnesium oxide (MAG-OX) 400 MG tablet Take 400 mg by mouth 2 (two) times daily.       . Multiple Vitamin (MULTIVITAMIN) tablet Take 1 tablet by mouth daily.        . nitrofurantoin (MACRODANTIN) 50 MG capsule Take 50 mg by mouth 1 day or 1 dose.        [name] omeprazole (PRILOSEC) 20 MG capsule Take 20 mg by mouth daily.        [name] oxyCODONE-acetaminophen (PERCOCET) 5-325 MG per tablet Take 1 tablet by mouth every 4 (four) hours as needed.        . phenazopyridine (PYRIDIUM) 200 MG tablet Take 200 mg by mouth 3 (three) times daily as needed.        [name] rOPINIRole (REQUIP XL) 2 MG 24 hr tablet Take 2 mg by mouth at  bedtime.        . sertraline (ZOLOFT) 50 MG tablet Take 100 mg by mouth daily. 2 tablets every morning      . simvastatin (ZOCOR) 20 MG tablet Take 20 mg by mouth at bedtime.        . sodium bicarbonate 650 MG tablet Take 650 mg by mouth 2 (two) times daily.       [name] warfarin (COUMADIN) 2 MG tablet Take 0.5 tablets (1 mg total) by mouth daily.  30 tablet  0     Past Medical History  Diagnosis Date  . Arrhythmia     chronic A-Fib  . Restless leg syndrome   . Pacemaker     implant for SSS  . 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 Leukocytosis   . Chronic kidney disease, stage I   . Sinoatrial node dysfunction     ROS:   All systems reviewed and negative except as noted in the HPI.   Past Surgical History  Procedure Date  . Insert / replace / remove pacemaker  implant for SSS  . Mitral valve replacement     s/p mechanical valve replaced with bovine valve 2010     Family History  Problem Relation Age of Onset  . Colon cancer       History   Social History  . Marital Status: Married    Spouse Name: N/A    Number of Children: N/A  . Years of Education: N/A   Occupational History  . Not on file.   Social History Main Topics  . Smoking status: Former Smoker -- 1.0 packs/day for 5 years    Types: Cigarettes    Quit date: 12/29/1970  . Smokeless tobacco: Not on file  . Alcohol Use: No  . Drug Use: No  . Sexually Active: Not on file   Other Topics Concern  . Not on file   Social History Narrative   Only use paper tape on patient.     BP 128/74  Ht 5\' 9"  (1.753 m)  Wt 184 lb 8 oz (83.689 kg)  BMI 27.25 kg/m2  Physical Exam:  Well appearing 66 year old woman, NAD HEENT: Unremarkable Neck:  No JVD, no thyromegally Back:  Marked kyphosis Lungs:  Clear with no wheezes, rales, or rhonchi. Well-healed pacemaker incision. HEART:  IRegular rate rhythm, no murmurs, no rubs, no  clicks Abd:  soft, positive bowel sounds, no organomegally, no rebound, no guarding Ext:  2 plus pulses, no edema, no cyanosis, no clubbing Skin:  No rashes no nodules Neuro:  CN II through XII intact, motor grossly intact  DEVICE  Normal device function.  See PaceArt for details.   Assess/Plan:

## 2012-01-26 NOTE — Assessment & Plan Note (Signed)
Her pacemaker is working normally. We'll plan to recheck in several months. 

## 2012-02-02 DIAGNOSIS — M19049 Primary osteoarthritis, unspecified hand: Secondary | ICD-10-CM | POA: Insufficient documentation

## 2012-02-06 ENCOUNTER — Encounter: Payer: Self-pay | Admitting: Cardiovascular Disease

## 2012-02-06 ENCOUNTER — Ambulatory Visit (INDEPENDENT_AMBULATORY_CARE_PROVIDER_SITE_OTHER): Payer: Medicare Other | Admitting: Cardiovascular Disease

## 2012-02-06 VITALS — BP 138/66 | HR 75 | Ht 69.0 in | Wt 187.0 lb

## 2012-02-06 DIAGNOSIS — I509 Heart failure, unspecified: Secondary | ICD-10-CM

## 2012-02-06 DIAGNOSIS — I495 Sick sinus syndrome: Secondary | ICD-10-CM

## 2012-02-06 DIAGNOSIS — Z950 Presence of cardiac pacemaker: Secondary | ICD-10-CM

## 2012-02-06 DIAGNOSIS — Z954 Presence of other heart-valve replacement: Secondary | ICD-10-CM

## 2012-02-06 DIAGNOSIS — I5032 Chronic diastolic (congestive) heart failure: Secondary | ICD-10-CM

## 2012-02-06 DIAGNOSIS — Z952 Presence of prosthetic heart valve: Secondary | ICD-10-CM

## 2012-02-06 DIAGNOSIS — I4891 Unspecified atrial fibrillation: Secondary | ICD-10-CM

## 2012-02-06 NOTE — Progress Notes (Signed)
Patient ID: Rhonda Griffith, female    DOB: 12-24-1945, 66 y.o.   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 At that time we decided to discontinue her Coumadin. Her INR at that time was more than 4. Notes indicate a history of sick sinus syndrome for which a pacemaker was placed.    She presents today for routine followup. She had a spinal tap at [facility] for high pressure in her ventricle. She reports no significant improvement after this procedure (drain?). She is walking with her walker on a regular basis secondary to gait instability. She is scheduled to have a bladder stimulator implant and was recently seen by Dr. Ladona Ridgel for clearance. Overall she reports that she is stable and has no new complaints   EKG shows paced rhythm, rate 75 beats per minute  Outpatient Encounter Prescriptions as of 02/06/2012  Medication Sig Dispense Refill  . aspirin 81 MG EC tablet Take 81 mg by mouth daily.        . Calcium Carbonate-Vitamin D (OSCAL 500/200 D-3 PO) Take by mouth 2 (two) times daily.      . Cholecalciferol (VITAMIN D3) 1000 UNITS tablet Take 1,000 Units by mouth daily. Take twice a month.      . cyclobenzaprine (FLEXERIL) 10 MG tablet Take 10 mg by mouth at bedtime.       [name] DIAZEPAM PO Take 0.5 mg by mouth as needed.      . diclofenac sodium (VOLTAREN) 1 % GEL Apply 1 application topically 2 (two) times daily.        [name] diltiazem (TIAZAC) 180 MG 24 hr capsule Take 1 capsule (180 mg total) by mouth daily.  30 capsule  6  . Docusate Calcium (STOOL SOFTENER PO) Take 1 tablet by mouth 2 (two) times daily.      . magnesium oxide (MAG-OX) 400 MG tablet Take 400 mg by mouth 2 (two) times daily.       .  nitrofurantoin (MACRODANTIN) 50 MG capsule Take 50 mg by mouth 1 day or 1 dose.        [name] omeprazole (PRILOSEC) 20 MG capsule Take 20 mg by mouth daily.        [name] oxyCODONE-acetaminophen (PERCOCET) 5-325 MG per tablet Take 1 tablet by mouth every 4 (four) hours as needed.        [name] rOPINIRole (REQUIP XL) 2 MG 24 hr tablet Take 2 mg by mouth at bedtime.        . sertraline (ZOLOFT) 50 MG tablet Take 100 mg by mouth daily. 2 tablets every morning      . simvastatin (ZOCOR) 20 MG tablet Take 20 mg by mouth at bedtime.        . sodium bicarbonate 650 MG tablet Take 650 mg by mouth 2 (two) times daily.       [name] warfarin (COUMADIN) 2 MG tablet Take 1 mg by mouth daily.      [name] DISCONTD: warfarin (COUMADIN) 2 MG tablet Take 0.5 tablets (1 mg total) by mouth daily.  30 tablet  0  . DISCONTD: Multiple Vitamin (MULTIVITAMIN) tablet Take 1 tablet by mouth daily.        [name] DISCONTD: phenazopyridine (  PYRIDIUM) 200 MG tablet Take 200 mg by mouth 3 (three) times daily as needed.          Outpatient Encounter Prescriptions as of 02/06/2012  Medication Sig Dispense Refill  . aspirin 81 MG EC tablet Take 81 mg by mouth daily.        . Calcium Carbonate-Vitamin D (OSCAL 500/200 D-3 PO) Take by mouth 2 (two) times daily.      . Cholecalciferol (VITAMIN D3) 1000 UNITS tablet Take 1,000 Units by mouth daily. Take twice a month.      . cyclobenzaprine (FLEXERIL) 10 MG tablet Take 10 mg by mouth at bedtime.       [name] DIAZEPAM PO Take 0.5 mg by mouth as needed.      . diclofenac sodium (VOLTAREN) 1 % GEL Apply 1 application topically 2 (two) times daily.        [name] diltiazem (TIAZAC) 180 MG 24 hr capsule Take 1 capsule (180 mg total) by mouth daily.  30 capsule  6  . Docusate Calcium (STOOL SOFTENER PO) Take 1 tablet by mouth 2 (two) times daily.      . magnesium oxide (MAG-OX) 400 MG tablet Take 400 mg by mouth 2 (two) times daily.       . nitrofurantoin (MACRODANTIN) 50 MG capsule Take 50 mg by mouth 1 day or 1 dose.          [name] omeprazole (PRILOSEC) 20 MG capsule Take 20 mg by mouth daily.        [name] oxyCODONE-acetaminophen (PERCOCET) 5-325 MG per tablet Take 1 tablet by mouth every 4 (four) hours as needed.        [name] rOPINIRole (REQUIP XL) 2 MG 24 hr tablet Take 2 mg by mouth at bedtime.        . sertraline (ZOLOFT) 50 MG tablet Take 100 mg by mouth daily. 2 tablets every morning      . simvastatin (ZOCOR) 20 MG tablet Take 20 mg by mouth at bedtime.        . sodium bicarbonate 650 MG tablet Take 650 mg by mouth 2 (two) times daily.       [name] warfarin (COUMADIN) 2 MG tablet Take 1 mg by mouth daily.         Review of Systems  HENT: Negative.   Eyes: Negative.   Respiratory: Negative.   Cardiovascular: Negative.   Gastrointestinal: Negative.   Musculoskeletal: Positive for joint swelling and gait problem.  Skin: Negative.   Neurological: Positive for weakness.       Problems with motor coordination and gait.  Hematological: Negative.   Psychiatric/Behavioral: Negative.   All other systems reviewed and are negative.    BP 138/66  Pulse 75  Ht 5\' 9"  (1.753 m)  Wt 187 lb (84.823 kg)  BMI 27.62 kg/m2  Physical Exam  Nursing note and vitals reviewed. Constitutional: She is oriented to person, place, and time. She appears well-developed and well-nourished.  HENT:  Head: Normocephalic.  Nose: Nose normal.  Mouth/Throat: Oropharynx is clear and moist.  Eyes: Conjunctivae are normal. Pupils are equal, round, and reactive to light.  Neck: Normal range of motion. Neck supple. No JVD present.  Cardiovascular: Normal rate, regular rhythm, S1 normal, S2 normal, normal heart sounds and intact distal pulses.  Exam reveals no gallop and no friction rub.   No murmur heard. Pulmonary/Chest: Effort normal and breath sounds normal. No respiratory distress. She has no wheezes. She has no rales. She exhibits no  tenderness.  Abdominal: Soft. Bowel sounds are normal. She exhibits no distension. There is no tenderness.   Musculoskeletal: Normal range of motion. She exhibits no edema and no tenderness.  Lymphadenopathy:    She has no cervical adenopathy.  Neurological: She is alert and oriented to person, place, and time. Coordination abnormal.  Skin: Skin is warm and dry. No rash noted. No erythema.  Psychiatric: She has a normal mood and affect. Her behavior is normal. Judgment and thought content normal.         Assessment and Plan

## 2012-02-06 NOTE — Assessment & Plan Note (Signed)
Doing well, pacemaker followed by [facility].

## 2012-02-06 NOTE — Assessment & Plan Note (Signed)
Bioprosthetic mitral valve replacement, echocardiogram September 2012 showing valve is well seated with no significant regurgitation.

## 2012-02-06 NOTE — Patient Instructions (Signed)
You are doing well. No medication changes were made.  Please call us if you have new issues that need to be addressed before your next appt.  Your physician wants you to follow-up in: 6 months.  You will receive a reminder letter in the mail two months in advance. If you don't receive a letter, please call our office to schedule the follow-up appointment.   

## 2012-02-06 NOTE — Assessment & Plan Note (Signed)
No clinical signs of diastolic CHF on today's visit. No medication changes made.

## 2012-04-18 ENCOUNTER — Other Ambulatory Visit: Payer: Self-pay

## 2012-04-18 MED ORDER — DILTIAZEM HCL ER BEADS 180 MG PO CP24: 180.0000 mg | ORAL_CAPSULE | Freq: Every day | ORAL | Status: DC

## 2012-04-18 NOTE — Telephone Encounter (Signed)
Refill sent for diltiazem er 180 mg take one tablet daily.  

## 2012-05-30 DIAGNOSIS — Z85820 Personal history of malignant melanoma of skin: Secondary | ICD-10-CM | POA: Insufficient documentation

## 2012-06-15 ENCOUNTER — Encounter: Payer: Self-pay | Admitting: Internal Medicine

## 2012-06-15 ENCOUNTER — Ambulatory Visit (INDEPENDENT_AMBULATORY_CARE_PROVIDER_SITE_OTHER): Payer: Medicare Other | Admitting: *Deleted

## 2012-06-15 DIAGNOSIS — I4891 Unspecified atrial fibrillation: Secondary | ICD-10-CM

## 2012-06-15 DIAGNOSIS — I495 Sick sinus syndrome: Secondary | ICD-10-CM

## 2012-06-15 LAB — PACEMAKER DEVICE OBSERVATION
BMOD-0001RV: HIGH
BMOD-0005RV: 95 {beats}/min
BRDY-0002RV: 60 {beats}/min
BRDY-0004RV: 120 {beats}/min
RV LEAD AMPLITUDE: 11.2 mv
RV LEAD IMPEDENCE PM: 467 Ohm

## 2012-06-15 NOTE — Progress Notes (Signed)
PPM check 

## 2012-06-25 ENCOUNTER — Telehealth: Payer: Self-pay | Admitting: Cardiovascular Disease

## 2012-06-25 NOTE — Telephone Encounter (Signed)
She has a dilated left atrium Would start lovenox 3 days before and 5 days after procedure if she can do shots. Start day following procedure if ok with surgery pmd can coordinate if they like

## 2012-06-25 NOTE — Telephone Encounter (Signed)
Pt calling states that she is to have a bladder procedure and wants to know if she needs to have a script for lovenox called in. Pt inr was checked today and 2.4 which is a Daaiel Starlin higher than usual. Procedure is scheduled for the 11th and to stop coumadin on the 6th. Family dr is taking her off of coumadin and PCP is asking about Lovenox.

## 2012-06-25 NOTE — Telephone Encounter (Signed)
This is not a [facility] pt, Coumadin is monitored elsewhere.

## 2012-06-26 NOTE — Telephone Encounter (Signed)
Pt informed.  Understanding verb She will have PCP manage this.

## 2012-06-28 ENCOUNTER — Telehealth: Payer: Self-pay

## 2012-06-28 NOTE — Telephone Encounter (Signed)
Attempted to reach patient.  Will need to come in the office on 10/4 or 10/7 to set up Lovenox bridge.

## 2012-06-28 NOTE — Telephone Encounter (Signed)
Pt returns call and appt schedule for Monday.

## 2012-06-28 NOTE — Telephone Encounter (Signed)
Having bladder procedure 07/06/12 To start holding warfarin 10/6

## 2012-06-28 NOTE — Telephone Encounter (Signed)
Pt says she told PCP Dr. Mariah Milling approved holding coumadin with Lovenox bridge but PCP says he/she does not feel comfortable managing this and asks [place] to manage. I told pt I would forward to coumadin clinic to see if we can manage.  Understanding verb.

## 2012-07-02 ENCOUNTER — Ambulatory Visit (INDEPENDENT_AMBULATORY_CARE_PROVIDER_SITE_OTHER): Payer: Medicare Other | Admitting: *Deleted

## 2012-07-02 DIAGNOSIS — Z952 Presence of prosthetic heart valve: Secondary | ICD-10-CM

## 2012-07-02 DIAGNOSIS — Z7901 Long term (current) use of anticoagulants: Secondary | ICD-10-CM

## 2012-07-02 DIAGNOSIS — I4891 Unspecified atrial fibrillation: Secondary | ICD-10-CM

## 2012-07-02 DIAGNOSIS — I050 Rheumatic mitral stenosis: Secondary | ICD-10-CM

## 2012-07-02 DIAGNOSIS — Z954 Presence of other heart-valve replacement: Secondary | ICD-10-CM

## 2012-07-02 LAB — BASIC METABOLIC PANEL
CO2: 27 mEq/L (ref 19–32)
Chloride: 104 mEq/L (ref 96–112)
Creatinine, Ser: 1.5 mg/dL — ABNORMAL HIGH (ref 0.4–1.2)
Potassium: 4.1 mEq/L (ref 3.5–5.1)
Sodium: 138 mEq/L (ref 135–145)

## 2012-07-02 LAB — POCT INR: INR: 2.2

## 2012-07-02 MED ORDER — ENOXAPARIN SODIUM 80 MG/0.8ML ~~LOC~~ SOLN: 80.0000 mg | Freq: Two times a day (BID) | SUBCUTANEOUS | Status: DC

## 2012-07-02 NOTE — Patient Instructions (Addendum)
07/01/2012 took last dose of coumadin 07/02/2012 no coumadin nor lovenox  07/03/2012   No Coumadin Lovenox 80 mg am and Lovenox 80 mg PM 07/04/2012  No coumadin Lovenox 80 mg Am and Lovenox 80 mg PM 07/05/2012 No Coumadin Lovenox 80 mg Am only 07/06/2012 Day of Procedure no coumadin nor Lovenox  Restart Coumadin and Lovenox when directed  by MD and when restart Coumadin take extra 1/2 tablet for 2 days  Continue lovenox and coumadin until seen in coumadin clinic on Wednesday 16th

## 2012-07-09 ENCOUNTER — Telehealth: Payer: Self-pay | Admitting: Cardiovascular Disease

## 2012-07-09 NOTE — Telephone Encounter (Signed)
Attempted to reach patient but her phone will not accept our incoming calls because it says the number is blocked.  Will have the [place] clinic try to contact her and have her call the clinic at [phone]

## 2012-07-09 NOTE — Telephone Encounter (Signed)
Had procedure for overactive bladder on Friday had to stop taking coumadin/lovenox. Patient calling today wanting to speak with a coumadin nurse on when and how to start back taking medication

## 2012-07-09 NOTE — Telephone Encounter (Signed)
Pt informed. Calling now

## 2012-07-09 NOTE — Telephone Encounter (Signed)
SEND TO COUMADIN POOL AS ERIKA IS OFF TODAY

## 2012-07-10 NOTE — Telephone Encounter (Signed)
Spoke with pt yesterday afternoon.  She had her procedure on Friday and restarted Coumadin and Lovenox on Saturday.  She did not have any problems that day but noticed bleeding on Sunday so she did not take any anticoagulation that day.  She questioned what to do today.  States she has not seen any additional bleeding and that MD mentioned to her that some blood was normal.  Told pt to go ahead and take Coumadin and Lovenox.  If she continues to see any bleeding, would need to contact urologist who performed the procedure on Friday.

## 2012-07-11 ENCOUNTER — Ambulatory Visit (INDEPENDENT_AMBULATORY_CARE_PROVIDER_SITE_OTHER): Payer: Medicare Other

## 2012-07-11 DIAGNOSIS — Z954 Presence of other heart-valve replacement: Secondary | ICD-10-CM

## 2012-07-11 DIAGNOSIS — I4891 Unspecified atrial fibrillation: Secondary | ICD-10-CM

## 2012-07-11 DIAGNOSIS — I050 Rheumatic mitral stenosis: Secondary | ICD-10-CM

## 2012-07-11 DIAGNOSIS — L940 Localized scleroderma [morphea]: Secondary | ICD-10-CM | POA: Insufficient documentation

## 2012-07-11 DIAGNOSIS — D039 Melanoma in situ, unspecified: Secondary | ICD-10-CM | POA: Insufficient documentation

## 2012-07-11 DIAGNOSIS — Z952 Presence of prosthetic heart valve: Secondary | ICD-10-CM

## 2012-07-11 DIAGNOSIS — Z7901 Long term (current) use of anticoagulants: Secondary | ICD-10-CM

## 2012-07-11 LAB — POCT INR: INR: 1.1

## 2012-07-16 ENCOUNTER — Ambulatory Visit (INDEPENDENT_AMBULATORY_CARE_PROVIDER_SITE_OTHER): Payer: Medicare Other

## 2012-07-16 DIAGNOSIS — I4891 Unspecified atrial fibrillation: Secondary | ICD-10-CM

## 2012-07-16 DIAGNOSIS — Z954 Presence of other heart-valve replacement: Secondary | ICD-10-CM

## 2012-07-16 DIAGNOSIS — Z7901 Long term (current) use of anticoagulants: Secondary | ICD-10-CM

## 2012-07-16 DIAGNOSIS — Z952 Presence of prosthetic heart valve: Secondary | ICD-10-CM

## 2012-07-16 DIAGNOSIS — I050 Rheumatic mitral stenosis: Secondary | ICD-10-CM

## 2012-07-16 LAB — POCT INR: INR: 1.7

## 2012-07-25 ENCOUNTER — Ambulatory Visit (INDEPENDENT_AMBULATORY_CARE_PROVIDER_SITE_OTHER): Payer: Medicare Other

## 2012-07-25 DIAGNOSIS — Z7901 Long term (current) use of anticoagulants: Secondary | ICD-10-CM

## 2012-07-25 DIAGNOSIS — Z954 Presence of other heart-valve replacement: Secondary | ICD-10-CM

## 2012-07-25 DIAGNOSIS — I4891 Unspecified atrial fibrillation: Secondary | ICD-10-CM

## 2012-07-25 DIAGNOSIS — I050 Rheumatic mitral stenosis: Secondary | ICD-10-CM

## 2012-07-25 DIAGNOSIS — Z952 Presence of prosthetic heart valve: Secondary | ICD-10-CM

## 2012-07-25 LAB — POCT INR: INR: 1.4

## 2012-07-30 ENCOUNTER — Ambulatory Visit (INDEPENDENT_AMBULATORY_CARE_PROVIDER_SITE_OTHER): Payer: Medicare Other

## 2012-07-30 DIAGNOSIS — I050 Rheumatic mitral stenosis: Secondary | ICD-10-CM

## 2012-07-30 DIAGNOSIS — Z952 Presence of prosthetic heart valve: Secondary | ICD-10-CM

## 2012-07-30 DIAGNOSIS — Z954 Presence of other heart-valve replacement: Secondary | ICD-10-CM

## 2012-07-30 DIAGNOSIS — Z7901 Long term (current) use of anticoagulants: Secondary | ICD-10-CM

## 2012-07-30 DIAGNOSIS — I4891 Unspecified atrial fibrillation: Secondary | ICD-10-CM

## 2012-08-08 ENCOUNTER — Ambulatory Visit (INDEPENDENT_AMBULATORY_CARE_PROVIDER_SITE_OTHER): Payer: Medicare Other

## 2012-08-08 DIAGNOSIS — Z7901 Long term (current) use of anticoagulants: Secondary | ICD-10-CM

## 2012-08-08 DIAGNOSIS — I4891 Unspecified atrial fibrillation: Secondary | ICD-10-CM

## 2012-08-08 DIAGNOSIS — Z952 Presence of prosthetic heart valve: Secondary | ICD-10-CM

## 2012-08-08 DIAGNOSIS — Z954 Presence of other heart-valve replacement: Secondary | ICD-10-CM

## 2012-08-08 DIAGNOSIS — I050 Rheumatic mitral stenosis: Secondary | ICD-10-CM

## 2012-08-08 LAB — POCT INR: INR: 2.2

## 2012-09-12 ENCOUNTER — Ambulatory Visit (INDEPENDENT_AMBULATORY_CARE_PROVIDER_SITE_OTHER): Payer: Medicare Other | Admitting: Cardiovascular Disease

## 2012-09-12 ENCOUNTER — Telehealth: Payer: Self-pay | Admitting: Cardiovascular Disease

## 2012-09-12 ENCOUNTER — Encounter: Payer: Self-pay | Admitting: Cardiovascular Disease

## 2012-09-12 VITALS — BP 136/80 | HR 71 | Ht 69.0 in | Wt 198.0 lb

## 2012-09-12 DIAGNOSIS — F430 Acute stress reaction: Secondary | ICD-10-CM

## 2012-09-12 DIAGNOSIS — I4891 Unspecified atrial fibrillation: Secondary | ICD-10-CM | POA: Insufficient documentation

## 2012-09-12 DIAGNOSIS — F438 Other reactions to severe stress: Secondary | ICD-10-CM

## 2012-09-12 DIAGNOSIS — R0602 Shortness of breath: Secondary | ICD-10-CM

## 2012-09-12 DIAGNOSIS — R000 Tachycardia, unspecified: Secondary | ICD-10-CM | POA: Insufficient documentation

## 2012-09-12 MED ORDER — METOPROLOL TARTRATE 25 MG PO TABS: 25.0000 mg | ORAL_TABLET | Freq: Two times a day (BID) | ORAL | Status: DC | PRN

## 2012-09-12 NOTE — Progress Notes (Signed)
Patient ID: Rhonda Griffith, female    DOB: 08/04/1946, 66 y.o.   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 At that time  her Coumadin was briefly held. Her INR at that time was more than 4. Notes indicate a history of sick sinus syndrome for which a pacemaker was placed.    Previous spinal tap at [facility] for high pressure in her ventricle. She reports no significant improvement after this procedure (drain?). She is walking with her walker on a regular basis. Legs are weak at baseline. Placement of a bladder stimulator implant for overactive bladder was denied despite cardiac clearance . She does report periods of tachycardia once per week lasting for minutes at a time. She does report significant recent stressors. Her brother is sick with cancer. He has stage IV cancer with metastases to the liver (colon?). She reports having shortness of breath when she bends over to tie her shoes and typically with sitting. She has not been very active at baseline. No significant edema. She's not taking Lasix on a regular basis   EKG shows paced rhythm, rate 71 beats per minute  Outpatient Encounter Prescriptions as of 09/12/2012  Medication Sig Dispense Refill  . aspirin 81 MG EC tablet Take 81 mg by mouth daily.        . Calcium Carbonate-Vitamin D (OSCAL 500/200 D-3 PO) Take by mouth 2 (two) times daily.      . Cholecalciferol (VITAMIN D3) 1000 UNITS tablet Take 1,000 Units by mouth daily. Take twice a month.      . cyclobenzaprine (FLEXERIL) 10 MG tablet Take 10 mg by mouth at bedtime.       [name] DIAZEPAM PO Take 0.5 mg by mouth as needed.      . diclofenac sodium (VOLTAREN) 1 % GEL Apply 1 application topically 2  (two) times daily.        [name] diltiazem (TIAZAC) 180 MG 24 hr capsule Take 1 capsule (180 mg total) by mouth daily.  30 capsule  6  . Docusate Calcium (STOOL SOFTENER PO) Take 1 tablet by mouth 2 (two) times daily.      . furosemide (LASIX) 20 MG tablet Take 20 mg by mouth as needed.      . magnesium oxide (MAG-OX) 400 MG tablet Take 400 mg by mouth 2 (two) times daily.       . Multiple Vitamin (MULTIVITAMIN) tablet Take 1 tablet by mouth daily.      . nitrofurantoin (MACRODANTIN) 50 MG capsule Take 50 mg by mouth 1 day or 1 dose.        [name] omeprazole (PRILOSEC) 20 MG capsule Take 20 mg by mouth daily.        [name] oxyCODONE-acetaminophen (PERCOCET) 5-325 MG per tablet Take 1 tablet by mouth every 4 (four) hours as needed.        [name] rOPINIRole (REQUIP XL) 2 MG 24 hr tablet Take 2 mg by mouth at bedtime.        . sertraline (ZOLOFT) 50 MG tablet Take 100 mg by mouth daily. 2 tablets every morning      . simvastatin (ZOCOR) 20 MG tablet Take 20 mg by mouth at bedtime.        [name]  sodium bicarbonate 650 MG tablet Take 650 mg by mouth 2 (two) times daily.       [name] warfarin (COUMADIN) 2 MG tablet Take 2 mg by mouth daily.       . metoprolol tartrate (LOPRESSOR) 25 MG tablet Take 1 tablet (25 mg total) by mouth 2 (two) times daily as needed.  60 tablet  3    Review of Systems  HENT: Negative.   Eyes: Negative.   Respiratory: Negative.   Cardiovascular: Negative.   Gastrointestinal: Negative.   Musculoskeletal: Positive for joint swelling and gait problem.  Skin: Negative.   Neurological: Positive for weakness.       Problems with motor coordination and gait.  Hematological: Negative.   Psychiatric/Behavioral: Negative.   All other systems reviewed and are negative.    BP 136/80  Pulse 71  Ht 5\' 9"  (1.753 m)  Wt 198 lb (89.812 kg)  BMI 29.24 kg/m2  Physical Exam  Nursing note and vitals reviewed. Constitutional: She is oriented to person, place, and time. She appears well-developed and  well-nourished.  HENT:  Head: Normocephalic.  Nose: Nose normal.  Mouth/Throat: Oropharynx is clear and moist.  Eyes: Conjunctivae normal are normal. Pupils are equal, round, and reactive to light.  Neck: Normal range of motion. Neck supple. No JVD present.  Cardiovascular: Normal rate, regular rhythm, S1 normal, S2 normal, normal heart sounds and intact distal pulses.  Exam reveals no gallop and no friction rub.   No murmur heard. Pulmonary/Chest: Effort normal and breath sounds normal. No respiratory distress. She has no wheezes. She has no rales. She exhibits no tenderness.  Abdominal: Soft. Bowel sounds are normal. She exhibits no distension. There is no tenderness.  Musculoskeletal: Normal range of motion. She exhibits no edema and no tenderness.  Lymphadenopathy:    She has no cervical adenopathy.  Neurological: She is alert and oriented to person, place, and time. Coordination abnormal.  Skin: Skin is warm and dry. No rash noted. No erythema.  Psychiatric: She has a normal mood and affect. Her behavior is normal. Judgment and thought content normal.         Assessment and Plan

## 2012-09-12 NOTE — Assessment & Plan Note (Signed)
She is on warfarin. He appears adequately controlled. Rare episodes of tachycardia. We have given her metoprolol to try for symptom relief.

## 2012-09-12 NOTE — Telephone Encounter (Signed)
Pt husband calling states that pt is having some SOB over the last few days and she is very tired. Wants to know if the pt needs to be seen or what they should do.

## 2012-09-12 NOTE — Patient Instructions (Addendum)
You are doing well. Please take metoprolol as needed in the AM for tachycardia (could start a 1/2 pill)  Please call us if you have new issues that need to be addressed before your next appt.  Your physician wants you to follow-up in: 6 months.  You will receive a reminder letter in the mail two months in advance. If you don't receive a letter, please call our office to schedule the follow-up appointment.

## 2012-09-12 NOTE — Assessment & Plan Note (Signed)
Her biggest issue today is the health of her brother who has metastatic cancer. She is very stressed given his poor situation and need for aggressive chemotherapy. This could be contributing to periods of tachycardia though she does report this was there prior to recent events.

## 2012-09-12 NOTE — Assessment & Plan Note (Signed)
She does report recent episodes of tachycardia, uncertain rhythm. She will have her pacemaker interrogated at routine followup with Dr. Ladona Ridgel. Episodes are rare, only lasting several minutes. We did prescribe Toprol and suggested she try 12.5 -25 mg pill in the morning daily for symptoms

## 2012-09-12 NOTE — Telephone Encounter (Signed)
Pt's husband says pt has become more sob and fatigued over the last 2 days. Says she feels as if her heart is "racing". Has ppm. Has not seen Dr. Mariah Milling since May 2013.  I had husband hold while I paged Dr. Mariah Milling.  Per Dr. Mariah Milling ok to work in this afternoon at 3:30 pm. Husband informed. Understanding verb.

## 2012-11-10 ENCOUNTER — Emergency Department: Payer: Self-pay | Admitting: Emergency Medicine

## 2012-11-19 ENCOUNTER — Telehealth: Payer: Self-pay | Admitting: *Deleted

## 2012-11-19 NOTE — Telephone Encounter (Signed)
Please see below and advise Thanks  FYI:CT of head in ER was negative for bleed CT maxillofacial area negative for any acute abnormalities

## 2012-11-19 NOTE — Telephone Encounter (Signed)
Pt calling states that she fell out of her bed few days ago and was seen at hospital due to this. They ask her to call us to see if she needed to stop her coumadin. Pt is unsure why she needs to do this

## 2012-11-21 NOTE — Telephone Encounter (Signed)
Probably would not stop Coumadin but would have her followup with Cicero Duck  No harm in holding Coumadin for several days after the fall but this is hindsite now Would certainly report any headaches, increased sinus pressure concerning for bleeding Would call the office for these symptoms

## 2012-11-22 NOTE — Telephone Encounter (Signed)
Pt informed pcp will continue to manage She has not stopped warfarin and will stay on med

## 2012-12-04 ENCOUNTER — Encounter: Payer: Self-pay | Admitting: Internal Medicine

## 2012-12-04 ENCOUNTER — Ambulatory Visit (INDEPENDENT_AMBULATORY_CARE_PROVIDER_SITE_OTHER): Payer: Medicare Other | Admitting: Internal Medicine

## 2012-12-04 ENCOUNTER — Other Ambulatory Visit: Payer: Self-pay | Admitting: Cardiovascular Disease

## 2012-12-04 VITALS — BP 162/80 | HR 73 | Ht 69.0 in | Wt 196.5 lb

## 2012-12-04 DIAGNOSIS — I5032 Chronic diastolic (congestive) heart failure: Secondary | ICD-10-CM

## 2012-12-04 DIAGNOSIS — Z950 Presence of cardiac pacemaker: Secondary | ICD-10-CM

## 2012-12-04 DIAGNOSIS — I4891 Unspecified atrial fibrillation: Secondary | ICD-10-CM

## 2012-12-04 LAB — PACEMAKER DEVICE OBSERVATION
BATTERY VOLTAGE: 2.78 V
BMOD-0001RV: HIGH
BMOD-0003RV: 15
BMOD-0004RV: 10
BRDY-0004RV: 120 {beats}/min
RV LEAD AMPLITUDE: 11.2 mv
RV LEAD THRESHOLD: 0.5 V
VENTRICULAR PACING PM: 96

## 2012-12-04 NOTE — Assessment & Plan Note (Signed)
Her ventricular rate appears to be well-controlled. We will make no changes in her medical therapy today.

## 2012-12-04 NOTE — Patient Instructions (Addendum)
Your physician wants you to follow-up in: 6 months for device check and 1 year with Dr. Ladona Ridgel. You will receive a reminder letter in the mail two months in advance. If you don't receive a letter, please call our office to schedule the follow-up appointment.

## 2012-12-04 NOTE — Assessment & Plan Note (Signed)
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 She is encouraged to maintain a low-sodium diet, and to keep track of her blood pressure. No change in medical therapy is made today although this would be a consideration if her blood pressure is persistently elevated.

## 2012-12-04 NOTE — Assessment & Plan Note (Signed)
Her Medtronic single-chamber pacemaker is working normally. Plan to recheck in several months.

## 2012-12-04 NOTE — Progress Notes (Signed)
HPI Rhonda Griffith returns today for followup. She is a very pleasant 67 year old woman with a history of chronic atrial fibrillation and symptomatic bradycardia, status post permanent pacemaker insertion. The patient has a history of mitral and aortic valve disease, and status post valve replacement, most recently in 2010. She is chronically on Coumadin. Her breathing has been stable and she has had no chest pain or palpitations. She does note that she fell out of her bed several weeks ago. She had no broken bones but sustained a hematoma under the skin around her left cheek. Also she had ecchymotic areas over her face. She is not sure how she fell at bed. She is placed bed rales since. She denies peripheral edema. Allergies  Allergen Reactions  . Morphine   . Tape     Allergic to plastic/latex tape. Only use paper tape on patient.  . Cephalexin Rash     Current Outpatient Prescriptions  Medication Sig Dispense Refill  . aspirin 81 MG EC tablet Take 81 mg by mouth daily.        . Calcium Carbonate-Vitamin D (OSCAL 500/200 D-3 PO) Take by mouth 2 (two) times daily.      . Cholecalciferol (VITAMIN D3) 1000 UNITS tablet Take 1,000 Units by mouth daily. Take twice a month.      . cyclobenzaprine (FLEXERIL) 10 MG tablet Take 10 mg by mouth at bedtime.       [name] DIAZEPAM PO Take 0.5 mg by mouth as needed.      . diclofenac sodium (VOLTAREN) 1 % GEL Apply 1 application topically 2 (two) times daily.        [name] diltiazem (TIAZAC) 180 MG 24 hr capsule Take 1 capsule (180 mg total) by mouth daily.  30 capsule  6  . Docusate Calcium (STOOL SOFTENER PO) Take 1 tablet by mouth 2 (two) times daily.      . furosemide (LASIX) 20 MG tablet Take 20 mg by mouth as needed.      . magnesium oxide (MAG-OX) 400 MG tablet Take 400 mg by mouth 2 (two) times daily.       . metoprolol tartrate (LOPRESSOR) 25 MG tablet Take 1 tablet (25 mg total) by mouth 2 (two) times daily as needed.  60 tablet  3  . Multiple Vitamin  (MULTIVITAMIN) tablet Take 1 tablet by mouth daily.      [name] omeprazole (PRILOSEC) 20 MG capsule Take 20 mg by mouth daily.        [name] oxyCODONE-acetaminophen (PERCOCET) 5-325 MG per tablet Take 1 tablet by mouth every 4 (four) hours as needed.        [name] rOPINIRole (REQUIP XL) 2 MG 24 hr tablet Take 2 mg by mouth at bedtime.        . sertraline (ZOLOFT) 50 MG tablet Take 100 mg by mouth daily. 2 tablets every morning      . simvastatin (ZOCOR) 20 MG tablet Take 20 mg by mouth at bedtime.        . sodium bicarbonate 650 MG tablet Take 650 mg by mouth 2 (two) times daily.       [name] warfarin (COUMADIN) 2 MG tablet Take 2 mg by mouth daily.        No current facility-administered medications for this visit.     Past Medical History  Diagnosis Date  . Arrhythmia     chronic A-Fib  . Restless leg syndrome   . Pacemaker     implant for SSS  .  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 Leukocytosis   . Chronic kidney disease, stage I   . Sinoatrial node dysfunction     ROS:   All systems reviewed and negative except as noted in the HPI.   Past Surgical History  Procedure Laterality Date  . Insert / replace / remove pacemaker      implant for SSS  . Mitral valve replacement      s/p mechanical valve replaced with bovine valve 2010  . Melanoma excision      face  . Bladder injection       Family History  Problem Relation Age of Onset  . Colon cancer       History   Social History  . Marital Status: Married    Spouse Name: N/A    Number of Children: N/A  . Years of Education: N/A   Occupational History  . Not on file.   Social History Main Topics  . Smoking status: Former Smoker -- 1.00 packs/day for 5 years    Types: Cigarettes    Quit date: 12/29/1970  . Smokeless tobacco: Not on file  . Alcohol Use: No  . Drug Use: No  . Sexually Active: Not on file   Other Topics Concern  . Not on file    Social History Narrative   Only use paper tape on patient.     BP 162/80  Pulse 73  Ht 5\' 9"  (1.753 m)  Wt 196 lb 8 oz (89.132 kg)  BMI 29 kg/m2  Physical Exam:  Well appearing 67 year old womanNAD HEENT: Unremarkable, except for a 2 x 3 cm hematoma on the left side of the face Neck:  No JVD, no thyromegally Lungs:  Clear with no wheezes, rales, or rhonchi. HEART:  Regular rate rhythm, no murmurs, no rubs, no clicks Abd:  soft, positive bowel sounds, no organomegally, no rebound, no guarding Ext:  2 plus pulses, no edema, no cyanosis, no clubbing Skin:  No rashes no nodules Neuro:  CN II through XII intact, motor grossly intact  EKG atrial fibrillation with underlying ventricular pacing  DEVICE  Normal device function.  See PaceArt for details.   Assess/Plan:

## 2012-12-05 ENCOUNTER — Other Ambulatory Visit: Payer: Self-pay | Admitting: *Deleted

## 2012-12-05 MED ORDER — DILTIAZEM HCL ER BEADS 180 MG PO CP24: 180.0000 mg | ORAL_CAPSULE | Freq: Every day | ORAL | Status: DC

## 2012-12-05 NOTE — Telephone Encounter (Signed)
Refilled Diltiazem sent to [facility] drug store.

## 2013-01-08 ENCOUNTER — Ambulatory Visit: Payer: Self-pay | Admitting: Emergency Medicine

## 2013-03-27 ENCOUNTER — Encounter: Payer: Self-pay | Admitting: Cardiovascular Disease

## 2013-03-27 ENCOUNTER — Ambulatory Visit (INDEPENDENT_AMBULATORY_CARE_PROVIDER_SITE_OTHER): Payer: Medicare Other | Admitting: Cardiovascular Disease

## 2013-03-27 VITALS — BP 124/70 | HR 67 | Ht 69.0 in | Wt 196.8 lb

## 2013-03-27 DIAGNOSIS — Z950 Presence of cardiac pacemaker: Secondary | ICD-10-CM

## 2013-03-27 DIAGNOSIS — R0789 Other chest pain: Secondary | ICD-10-CM

## 2013-03-27 DIAGNOSIS — I5032 Chronic diastolic (congestive) heart failure: Secondary | ICD-10-CM

## 2013-03-27 DIAGNOSIS — I509 Heart failure, unspecified: Secondary | ICD-10-CM

## 2013-03-27 DIAGNOSIS — I4891 Unspecified atrial fibrillation: Secondary | ICD-10-CM

## 2013-03-27 NOTE — Assessment & Plan Note (Signed)
Appears relatively euvolemic. No significant edema, no shortness of breath. She uses Lasix periodically.

## 2013-03-27 NOTE — Assessment & Plan Note (Signed)
Followed by Dr. Taylor 

## 2013-03-27 NOTE — Assessment & Plan Note (Signed)
Chronic atrial fibrillation, on warfarin. No recent falls

## 2013-03-27 NOTE — Patient Instructions (Addendum)
You are doing well. No medication changes were made.  Please call us if you have new issues that need to be addressed before your next appt.  Your physician wants you to follow-up in: 6 months.  You will receive a reminder letter in the mail two months in advance. If you don't receive a letter, please call our office to schedule the follow-up appointment.   

## 2013-03-27 NOTE — Progress Notes (Signed)
Patient ID: Rhonda Griffith, female    DOB: [birth year], 67 y.o.   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 At that time  her Coumadin was briefly held. Her INR at that time was more than 4. Notes indicate a history of sick sinus syndrome for which a pacemaker was placed.    Previous spinal tap at [facility] for high pressure in her ventricle. She reports no significant improvement after this procedure (drain?). She is walking with her walker on a regular basis. Legs are weak at baseline. Placement of a bladder stimulator implant for overactive bladder was denied despite cardiac clearance .   Her brother is sick with cancer. He has stage IV cancer with metastases to the liver (colon?).  She reports having shortness of breath when she bends over to tie her shoes and typically with sitting. She has not been very active at baseline. No significant edema. She's not taking Lasix on a regular basis   She reports having episodes of squeezing chest pain several weeks ago. It lasted several minutes. She has had 3 episodes total, the other 2 episodes several months ago. No symptoms with exertion. Most recent episode occurred while sitting.  She has significant fatigue. She does not sleep well at nighttime, was awake at 3:00 this morning on the computer. Zocor held for muscle aches.  EKG shows paced rhythm, rate 67 beats per minute  Outpatient Encounter Prescriptions as of 03/27/2013  Medication Sig Dispense Refill  . albuterol (PROVENTIL HFA;VENTOLIN HFA) 108 (90 BASE) MCG/ACT inhaler Inhale 2 puffs into the lungs every 6 (six) hours as needed for wheezing.      [name] aspirin 81 MG EC tablet Take 81 mg by mouth daily.         . Calcium Carbonate-Vitamin D (OSCAL 500/200 D-3 PO) Take by mouth 2 (two) times daily.      . Cholecalciferol (VITAMIN D3) 1000 UNITS tablet Take 1,000 Units by mouth daily. Take twice a month.      . cyclobenzaprine (FLEXERIL) 10 MG tablet Take 10 mg by mouth at bedtime.       [name] DIAZEPAM PO Take 0.5 mg by mouth as needed.      . diclofenac sodium (VOLTAREN) 1 % GEL Apply 1 application topically 2 (two) times daily.        [name] diltiazem (TIAZAC) 180 MG 24 hr capsule Take 1 capsule (180 mg total) by mouth daily.  30 capsule  6  . Docusate Calcium (STOOL SOFTENER PO) Take 1 tablet by mouth 2 (two) times daily.      . DULoxetine (CYMBALTA) 60 MG capsule Take 60 mg by mouth daily.      . fesoterodine (TOVIAZ) 8 MG TB24 Take 8 mg by mouth daily.      . furosemide (LASIX) 20 MG tablet Take 20 mg by mouth as needed.      . magnesium oxide (MAG-OX) 400 MG tablet Take 800 mg by mouth 2 (two) times daily.       . meclizine (ANTIVERT) 25 MG tablet Take 25 mg by mouth daily.      . metoprolol tartrate (LOPRESSOR) 25 MG tablet Take 1 tablet (25 mg total) by mouth  2 (two) times daily as needed.  60 tablet  3  . Multiple Vitamin (MULTIVITAMIN) tablet Take 1 tablet by mouth daily.      . nitrofurantoin (MACRODANTIN) 50 MG capsule Take 50 mg by mouth daily.      [name] omeprazole (PRILOSEC) 20 MG capsule Take 20 mg by mouth daily.        [name] oxyCODONE-acetaminophen (PERCOCET) 5-325 MG per tablet Take 1 tablet by mouth every 4 (four) hours as needed.        [name] rOPINIRole (REQUIP XL) 2 MG 24 hr tablet Take 2 mg by mouth at bedtime.        . simvastatin (ZOCOR) 20 MG tablet Take 20 mg by mouth at bedtime.        . sodium bicarbonate 650 MG tablet Take 650 mg by mouth 2 (two) times daily.       [name] warfarin (COUMADIN) 2 MG tablet Take 2 mg by mouth daily.       . [DISCONTINUED] sertraline (ZOLOFT) 50 MG tablet Take 100 mg by mouth daily. 2 tablets every morning       No facility-administered encounter medications on  file as of 03/27/2013.     Review of Systems  Constitutional: Positive for fatigue.  HENT: Negative.   Eyes: Negative.   Respiratory: Negative.   Cardiovascular: Negative.   Gastrointestinal: Negative.   Musculoskeletal: Positive for joint swelling and gait problem.  Skin: Negative.   Neurological: Positive for weakness.       Problems with motor coordination and gait.  Psychiatric/Behavioral: Negative.   All other systems reviewed and are negative.    BP 124/70  Pulse 67  Ht 5\' 9"  (1.753 m)  Wt 196 lb 12 oz (89.245 kg)  BMI 29.04 kg/m2  Physical Exam  Nursing note and vitals reviewed. Constitutional: She is oriented to person, place, and time. She appears well-developed and well-nourished.  HENT:  Head: Normocephalic.  Nose: Nose normal.  Mouth/Throat: Oropharynx is clear and moist.  Eyes: Conjunctivae are normal. Pupils are equal, round, and reactive to light.  Neck: Normal range of motion. Neck supple. No JVD present.  Cardiovascular: Normal rate, regular rhythm, S1 normal, S2 normal, normal heart sounds and intact distal pulses.  Exam reveals no gallop and no friction rub.   No murmur heard. Pulmonary/Chest: Effort normal and breath sounds normal. No respiratory distress. She has no wheezes. She has no rales. She exhibits no tenderness.  Abdominal: Soft. Bowel sounds are normal. She exhibits no distension. There is no tenderness.  Musculoskeletal: Normal range of motion. She exhibits no edema and no tenderness.  Lymphadenopathy:    She has no cervical adenopathy.  Neurological: She is alert and oriented to person, place, and time. Coordination abnormal.  Skin: Skin is warm and dry. No rash noted. No erythema.  Psychiatric: She has a normal mood and affect. Her behavior is normal. Judgment and thought content normal.    Assessment and Plan

## 2013-06-21 ENCOUNTER — Ambulatory Visit (INDEPENDENT_AMBULATORY_CARE_PROVIDER_SITE_OTHER): Payer: Medicare Other | Admitting: *Deleted

## 2013-06-21 DIAGNOSIS — I4891 Unspecified atrial fibrillation: Secondary | ICD-10-CM

## 2013-06-21 LAB — PACEMAKER DEVICE OBSERVATION
BMOD-0001RV: HIGH
BMOD-0003RV: 15
BMOD-0005RV: 95 {beats}/min
BRDY-0002RV: 60 {beats}/min
BRDY-0004RV: 120 {beats}/min
RV LEAD AMPLITUDE: 11.2 mv
RV LEAD IMPEDENCE PM: 481 Ohm
VENTRICULAR PACING PM: 90

## 2013-06-21 NOTE — Progress Notes (Signed)
PPM check in office. 

## 2013-07-22 ENCOUNTER — Encounter: Payer: Self-pay | Admitting: Internal Medicine

## 2013-07-23 ENCOUNTER — Other Ambulatory Visit: Payer: Self-pay | Admitting: Cardiovascular Disease

## 2013-07-23 ENCOUNTER — Other Ambulatory Visit: Payer: Self-pay | Admitting: *Deleted

## 2013-07-23 MED ORDER — DILTIAZEM HCL ER BEADS 180 MG PO CP24: 180.0000 mg | ORAL_CAPSULE | Freq: Every day | ORAL | Status: DC

## 2013-07-23 NOTE — Telephone Encounter (Signed)
Requested Prescriptions   Signed Prescriptions Disp Refills  . diltiazem (TIAZAC) 180 MG 24 hr capsule 30 capsule 3    Sig: Take 1 capsule (180 mg total) by mouth daily.    Authorizing Provider: Antonieta Iba    Ordering User: Kendrick Fries

## 2013-09-23 ENCOUNTER — Ambulatory Visit: Payer: Medicare Other | Admitting: Cardiovascular Disease

## 2013-10-01 ENCOUNTER — Ambulatory Visit (INDEPENDENT_AMBULATORY_CARE_PROVIDER_SITE_OTHER): Payer: Medicare Other | Admitting: Cardiovascular Disease

## 2013-10-01 ENCOUNTER — Encounter: Payer: Self-pay | Admitting: Cardiovascular Disease

## 2013-10-01 VITALS — BP 122/70 | HR 71 | Ht 69.5 in | Wt 204.2 lb

## 2013-10-01 DIAGNOSIS — I5032 Chronic diastolic (congestive) heart failure: Secondary | ICD-10-CM

## 2013-10-01 DIAGNOSIS — R0602 Shortness of breath: Secondary | ICD-10-CM

## 2013-10-01 DIAGNOSIS — I4891 Unspecified atrial fibrillation: Secondary | ICD-10-CM

## 2013-10-01 DIAGNOSIS — I509 Heart failure, unspecified: Secondary | ICD-10-CM

## 2013-10-01 DIAGNOSIS — Z950 Presence of cardiac pacemaker: Secondary | ICD-10-CM

## 2013-10-01 DIAGNOSIS — R5383 Other fatigue: Secondary | ICD-10-CM | POA: Insufficient documentation

## 2013-10-01 DIAGNOSIS — R5381 Other malaise: Secondary | ICD-10-CM

## 2013-10-01 NOTE — Assessment & Plan Note (Signed)
Followed by [facility] EP 

## 2013-10-01 NOTE — Progress Notes (Signed)
Patient ID: Rhonda Griffith, female    DOB: 10/24/1945, [age] y.o.   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 At that time  her Coumadin was briefly held. Her INR at that time was more than 4. Notes indicate a history of sick sinus syndrome for which a pacemaker was placed. History of bilateral knee replacements   [facility] in her ventricle. She reports no significant improvement after this procedure (drain?). She is walking with her walker on a regular basis. Legs are weak at baseline. Placement of a bladder stimulator implant for overactive bladder was denied despite cardiac clearance .   Her brother is sick with cancer. He has stage IV cancer with metastases to the liver (colon?).   Followup today she reports that she is tired all the time. She spent significant time playing on the computer, husband also interrupts her sleep as he has a severe underlying lung condition. She does take a pain pill occasionally to sleep better. She does have Valium for anxiety. Also has severe bilateral knee pain is scheduled to see orthopedics in the next day or so. Statin was held in the past for leg pain, Zocor. She does report having swelling in the right lower extremity, no significant swelling of the left lower extremity. She has diagnosed obstructive sleep apnea but unable to tolerate CPAP. Total cholesterol 184, LDL 98, HDL 63  EKG shows paced rhythm, rate 71 beats per minute  Outpatient Encounter Prescriptions as of 10/01/2013  Medication Sig  . albuterol (PROVENTIL HFA;VENTOLIN HFA) 108 (90 BASE) MCG/ACT inhaler Inhale 2 puffs into the lungs every 6 (six) hours as needed for  wheezing.  [name]. aspirin 81 MG EC tablet Take 81 mg by mouth daily.    . Calcium Carbonate-Vitamin D (OSCAL 500/200 D-3 PO) Take by mouth 2 (two) times daily.  . Cholecalciferol (VITAMIN D3) 1000 UNITS tablet Take 1,000 Units by mouth daily. Take twice a month.  . cyclobenzaprine (FLEXERIL) 10 MG tablet Take 10 mg by mouth at bedtime.   [name]. DIAZEPAM PO Take 0.5 mg by mouth as needed.  . diclofenac sodium (VOLTAREN) 1 % GEL Apply 1 application topically 2 (two) times daily.    [name]. diltiazem (DILACOR XR) 180 MG 24 hr capsule Take 180 mg by mouth daily.  Tery Sanfilippo. Docusate Calcium (STOOL SOFTENER PO) Take 1 tablet by mouth 2 (two) times daily.  . DULoxetine (CYMBALTA) 60 MG capsule Take 60 mg by mouth daily.  . furosemide (LASIX) 20 MG tablet Take 20 mg by mouth as needed.  . magnesium oxide (MAG-OX) 400 MG tablet Take 800 mg by mouth 2 (two) times daily.   . meclizine (ANTIVERT) 25 MG tablet Take 25 mg by mouth daily.  . metoprolol tartrate (LOPRESSOR) 25 MG tablet Take 1 tablet (25 mg total) by mouth 2 (two) times daily as needed.  . Multiple Vitamin (MULTIVITAMIN) tablet Take 1 tablet by mouth daily.  [name]. omeprazole (PRILOSEC) 20 MG capsule Take 20 mg by mouth daily.    [name]. oxyCODONE-acetaminophen (PERCOCET) 5-325 MG per tablet Take 1 tablet by mouth every 4 (four) hours as needed.    [name]. rOPINIRole (REQUIP XL) 2 MG  24 hr tablet Take 2 mg by mouth at bedtime.    . sodium bicarbonate 650 MG tablet Take 650 mg by mouth 2 (two) times daily.   [name] warfarin (COUMADIN) 2 MG tablet Take 2 mg by mouth daily.   . [DISCONTINUED] diltiazem (DILACOR XR) 180 MG 24 hr capsule TAKE ONE CAPSULE BY MOUTH EVERY DAY  . [DISCONTINUED] diltiazem (TIAZAC) 180 MG 24 hr capsule Take 1 capsule (180 mg total) by mouth daily.     Review of Systems  Constitutional: Positive for fatigue.  HENT: Negative.   Eyes: Negative.   Respiratory: Negative.   Cardiovascular: Negative.   Gastrointestinal: Negative.   Musculoskeletal: Positive for  gait problem and joint swelling.  Skin: Negative.   Neurological: Positive for weakness.       Problems with motor coordination and gait.  Psychiatric/Behavioral: Negative.   All other systems reviewed and are negative.    BP 122/70  Pulse 71  Ht 5' 9.5" (1.765 m)  Wt 204 lb 4 oz (92.647 kg)  BMI 29.74 kg/m2  Physical Exam  Nursing note and vitals reviewed. Constitutional: She is oriented to person, place, and time. She appears well-developed and well-nourished.  HENT:  Head: Normocephalic.  Nose: Nose normal.  Mouth/Throat: Oropharynx is clear and moist.  Eyes: Conjunctivae are normal. Pupils are equal, round, and reactive to light.  Neck: Normal range of motion. Neck supple. No JVD present.  Cardiovascular: Normal rate, regular rhythm, S1 normal, S2 normal, normal heart sounds and intact distal pulses.  Exam reveals no gallop and no friction rub.   No murmur heard. Pulmonary/Chest: Effort normal and breath sounds normal. No respiratory distress. She has no wheezes. She has no rales. She exhibits no tenderness.  Abdominal: Soft. Bowel sounds are normal. She exhibits no distension. There is no tenderness.  Musculoskeletal: Normal range of motion. She exhibits no edema and no tenderness.  Lymphadenopathy:    She has no cervical adenopathy.  Neurological: She is alert and oriented to person, place, and time. Coordination abnormal.  Skin: Skin is warm and dry. No rash noted. No erythema.  Psychiatric: She has a normal mood and affect. Her behavior is normal. Judgment and thought content normal.    Assessment and Plan

## 2013-10-01 NOTE — Assessment & Plan Note (Signed)
Heart rate reasonably well controlled on diltiazem. She has not been taking metoprolol for some time. Given heart rate and blood pressure are well controlled and given her severe fatigue, will not restart metoprolol at this time

## 2013-10-01 NOTE — Assessment & Plan Note (Signed)
Appears relatively euvolemic on her current medication regimen. She takes Lasix as needed. Swelling only in the right lower extremity, none in the left. This is likely secondary to previous knee surgeries

## 2013-10-01 NOTE — Assessment & Plan Note (Signed)
Poor sleep hygiene. Plays on the computer for hours at a time, also interrupted sleep secondary to her husband. Sleeps/naps in the day

## 2013-10-01 NOTE — Patient Instructions (Addendum)
You are doing well. Hold the metoprolol  Ask the knee doctor about minimally invasive surgery   Please call us if you have new issues that need to be addressed before your next appt.  Your physician wants you to follow-up in: 6 months.  You will receive a reminder letter in the mail two months in advance. If you don't receive a letter, please call our office to schedule the follow-up appointment.

## 2013-11-23 ENCOUNTER — Other Ambulatory Visit: Payer: Self-pay | Admitting: Cardiovascular Disease

## 2013-11-25 ENCOUNTER — Other Ambulatory Visit: Payer: Self-pay | Admitting: Cardiovascular Disease

## 2013-11-27 ENCOUNTER — Encounter: Payer: Self-pay | Admitting: *Deleted

## 2013-12-26 ENCOUNTER — Encounter: Payer: Self-pay | Admitting: Internal Medicine

## 2013-12-26 ENCOUNTER — Ambulatory Visit (INDEPENDENT_AMBULATORY_CARE_PROVIDER_SITE_OTHER): Payer: Medicare Other | Admitting: Internal Medicine

## 2013-12-26 VITALS — BP 153/79 | HR 75 | Ht 69.0 in | Wt 203.5 lb

## 2013-12-26 DIAGNOSIS — I4891 Unspecified atrial fibrillation: Secondary | ICD-10-CM

## 2013-12-26 LAB — MDC_IDC_ENUM_SESS_TYPE_INCLINIC
Battery Remaining Longevity: 38 mo
Battery Voltage: 2.77 V
Brady Statistic RV Percent Paced: 81 %
Lead Channel Impedance Value: 0 Ohm
Lead Channel Impedance Value: 490 Ohm
Lead Channel Pacing Threshold Pulse Width: 0.4 ms
Lead Channel Sensing Intrinsic Amplitude: 11.2 mV
Lead Channel Setting Pacing Amplitude: 2 V
Lead Channel Setting Sensing Sensitivity: 4 mV
MDC IDC MSMT BATTERY IMPEDANCE: 1996 Ohm
MDC IDC MSMT LEADCHNL RV PACING THRESHOLD AMPLITUDE: 0.5 V
MDC IDC SESS DTM: 20150402114239
MDC IDC SET LEADCHNL RV PACING PULSEWIDTH: 0.4 ms

## 2013-12-26 NOTE — Progress Notes (Signed)
sk[facility]      Patient Care Team: Nonda Louodd [place] [facility] as PCP - General (Family Medicine)   HPI  Rhonda Griffith is a 68 y.o. female Former patient of Dr. Leonia ReevesGT who underwent pacemaker implantation for symptomatic bradycardia in the context of atrial fibrillation.  She also has a history of mitral   valve disease status post  Bioprosthetic mitral valve replacement. Last echocardiogram 9/12 demonstrated normal LV function normal valvular function mild-moderate left atrial dilatation  Past Medical History  Diagnosis Date  . Arrhythmia     chronic A-Fib  . Restless leg syndrome   . Pacemaker     implant for SSS  . 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 Leukocytosis   . Chronic kidney disease, stage I   . Sinoatrial node dysfunction     Past Surgical History  Procedure Laterality Date  . Insert / replace / remove pacemaker      implant for SSS  . Mitral valve replacement      s/p mechanical valve replaced with bovine valve 2010  . Melanoma excision      face  . Bladder injection    . Nasal sinus surgery      Current Outpatient Prescriptions  Medication Sig Dispense Refill  . albuterol (PROVENTIL HFA;VENTOLIN HFA) 108 (90 BASE) MCG/ACT inhaler Inhale 2 puffs into the lungs every 6 (six) hours as needed for wheezing.      [name]. aspirin 81 MG EC tablet Take 81 mg by mouth daily.        . Calcium Carbonate-Vitamin D (OSCAL 500/200 D-3 PO) Take by mouth 2 (two) times daily.      . Cholecalciferol (VITAMIN D3) 1000 UNITS tablet Take 1,000 Units by mouth daily. Take twice a month.      . cyclobenzaprine (FLEXERIL) 10 MG tablet Take 10 mg by mouth at bedtime.       [name]. DIAZEPAM PO Take 0.5 mg by mouth as needed.      . diclofenac sodium (VOLTAREN) 1 % GEL Apply 1 application topically 2 (two) times daily.        [name]. diltiazem (DILACOR XR) 180 MG 24 hr capsule Take 180 mg by mouth daily.      Tery Sanfilippo. Docusate Calcium  (STOOL SOFTENER PO) Take 1 tablet by mouth 2 (two) times daily.      . DULoxetine (CYMBALTA) 60 MG capsule Take 60 mg by mouth daily.      . furosemide (LASIX) 20 MG tablet Take 20 mg by mouth as needed.      . magnesium oxide (MAG-OX) 400 MG tablet Take 800 mg by mouth 2 (two) times daily.       . meclizine (ANTIVERT) 25 MG tablet Take 25 mg by mouth daily.      . metoprolol tartrate (LOPRESSOR) 25 MG tablet TAKE 1 TABLET BY MOUTH TWICE DAILY AS NEEDED  180 tablet  3  . Multiple Vitamin (MULTIVITAMIN) tablet Take 1 tablet by mouth daily.      [name]. omeprazole (PRILOSEC) 20 MG capsule Take 20 mg by mouth daily.        [name]. oxyCODONE-acetaminophen (PERCOCET) 5-325 MG per tablet Take 1 tablet by mouth every 4 (four) hours as needed.        [name]. rOPINIRole (REQUIP XL) 2 MG 24 hr tablet Take 2 mg by mouth at bedtime.        . sodium bicarbonate  650 MG tablet Take 650 mg by mouth 2 (two) times daily.       [name] warfarin (COUMADIN) 2 MG tablet Take 2 mg by mouth daily.        No current facility-administered medications for this visit.    Allergies  Allergen Reactions  . Morphine   . Tape     Allergic to plastic/latex tape. Only use paper tape on patient.  . Cephalexin Rash    Review of Systems negative except from HPI and PMH  Physical Exam BP 153/79  Pulse 75  Ht 5\' 9"  (1.753 m)  Wt 203 lb 8 oz (92.307 kg)  BMI 30.04 kg/m2 Well developed and nourished in no acute distress HENT normal Neck supple with JVP-flat Clear Regular rate and rhythm, no murmurs or gallops Abd-soft with active BS No Clubbing cyanosis edema Skin-warm and dry A & Oriented  Grossly normal sensory and motor function     Assessment and  Plan  1atrial fibrillation -permanent  Bradycardia  Pacemaker-[employer]  History of mitral valve replacement, mechanical followed by bioprosthetic most recently 2010  Patients device is functioning normally. She's had some palpitations and has had some sense atrial  fibrillation.  He has a history of falls most recently 2 years ago. This prompted a down titration her Coumadin target from 2.5--3.5>>> 1.8-2.4.  I will review this with Dr. Knute Neu. I wonder whether her risk of stroke is higher than her risk of falls.

## 2013-12-26 NOTE — Patient Instructions (Signed)
Your physician wants you to follow-up in: 6 months device clinic. You will receive a reminder letter in the mail two months in advance. If you don't receive a letter, please call our office to schedule the follow-up appointment.  Your physician wants you to follow-up in: 1 year with Dr. Klein.  You will receive a reminder letter in the mail two months in advance. If you don't receive a letter, please call our office to schedule the follow-up appointment.  

## 2014-02-03 DIAGNOSIS — N390 Urinary tract infection, site not specified: Secondary | ICD-10-CM | POA: Insufficient documentation

## 2014-03-31 ENCOUNTER — Ambulatory Visit: Payer: Medicare Other | Admitting: Cardiovascular Disease

## 2014-04-04 ENCOUNTER — Encounter: Payer: Self-pay | Admitting: Cardiovascular Disease

## 2014-04-04 ENCOUNTER — Ambulatory Visit (INDEPENDENT_AMBULATORY_CARE_PROVIDER_SITE_OTHER): Payer: Medicare Other | Admitting: Cardiovascular Disease

## 2014-04-04 VITALS — BP 153/86 | HR 69 | Ht 69.0 in | Wt 203.0 lb

## 2014-04-04 DIAGNOSIS — I509 Heart failure, unspecified: Secondary | ICD-10-CM

## 2014-04-04 DIAGNOSIS — R5383 Other fatigue: Secondary | ICD-10-CM

## 2014-04-04 DIAGNOSIS — R5382 Chronic fatigue, unspecified: Secondary | ICD-10-CM

## 2014-04-04 DIAGNOSIS — I4891 Unspecified atrial fibrillation: Secondary | ICD-10-CM

## 2014-04-04 DIAGNOSIS — I5032 Chronic diastolic (congestive) heart failure: Secondary | ICD-10-CM

## 2014-04-04 DIAGNOSIS — Z950 Presence of cardiac pacemaker: Secondary | ICD-10-CM

## 2014-04-04 DIAGNOSIS — R5381 Other malaise: Secondary | ICD-10-CM

## 2014-04-04 NOTE — Assessment & Plan Note (Addendum)
Pacemaker followed by Dr. Klein 

## 2014-04-04 NOTE — Patient Instructions (Signed)
You are doing well.  Please increase the warfarin up to 4 mg on Monday, Wednesday and Friday, 3 mg the other day  Please call us if you have new issues that need to be addressed before your next appt.  Your physician wants you to follow-up in: 6 months.  You will receive a reminder letter in the mail two months in advance. If you don't receive a letter, please call our office to schedule the follow-up appointment.

## 2014-04-04 NOTE — Progress Notes (Signed)
Patient ID: Rhonda Griffith, female    DOB: 1946-06-22, 68 y.o.   MRN: [id number]  HPI Comments: Ms. 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 At that time  her Coumadin was briefly held. Her INR at that time was more than 4. Notes indicate a history of sick sinus syndrome for which a pacemaker was placed. History of bilateral knee replacements   Previous spinal tap at [facility] for high pressure in her ventricle. She reports no significant improvement after this procedure (drain?).  In followup today, she reports that she is very tired. Her husband is in the hospital. She has been spending significant time going back and forth to the hospital keep him company. Recent INR on 03/26/2014 was 1.7. She was recently seen by Dr. Graciela Husbands with recommendation for goal INR 2.0 or greater  She is walking with her walker on a regular basis. Legs are weak at baseline. No recent falls Placement of a bladder stimulator implant for overactive bladder was denied despite cardiac clearance .  In general She spends significant time playing on the computer, husband also interrupts her sleep as he has a severe underlying lung condition.  She does take a pain pill occasionally to sleep better. She does have Valium for anxiety. Also has severe bilateral knee pain.  Statin was held in the past for leg pain, Zocor. She has diagnosed obstructive sleep apnea but unable to tolerate CPAP. Total cholesterol 184, LDL 98, HDL 63  EKG shows paced rhythm, rate 69 beats per minute  Outpatient Encounter Prescriptions as of 04/04/2014  Medication Sig  . albuterol (PROVENTIL HFA;VENTOLIN HFA) 108 (90 BASE) MCG/ACT inhaler Inhale 2 puffs into the lungs every 6 (six) hours as  needed for wheezing.  [name] aspirin 81 MG EC tablet Take 81 mg by mouth daily.    . Calcium Carbonate-Vitamin D (OSCAL 500/200 D-3 PO) Take by mouth 2 (two) times daily.  . Cholecalciferol (VITAMIN D3) 1000 UNITS tablet Take 1,000 Units by mouth daily. Take twice a month.  . cyclobenzaprine (FLEXERIL) 10 MG tablet Take 10 mg by mouth at bedtime.   [name] DIAZEPAM PO Take 0.5 mg by mouth as needed.  . diclofenac sodium (VOLTAREN) 1 % GEL Apply 1 application topically 2 (two) times daily.    [name] diltiazem (DILACOR XR) 180 MG 24 hr capsule Take 180 mg by mouth daily.  Tery Sanfilippo Calcium (STOOL SOFTENER PO) Take 1 tablet by mouth 2 (two) times daily.  . DULoxetine (CYMBALTA) 60 MG capsule Take 60 mg by mouth daily.  . furosemide (LASIX) 20 MG tablet Take 20 mg by mouth as needed.  . magnesium oxide (MAG-OX) 400 MG tablet Take 800 mg by mouth 2 (two) times daily.   . meclizine (ANTIVERT) 25 MG tablet Take 25 mg by mouth as needed.   . metoprolol tartrate (LOPRESSOR) 25 MG tablet TAKE 1 TABLET BY MOUTH TWICE DAILY AS NEEDED  . Multiple Vitamin (MULTIVITAMIN) tablet Take 1 tablet by mouth daily.  [name] omeprazole (PRILOSEC) 20 MG capsule Take 20 mg by mouth daily.    [name] oxyCODONE-acetaminophen (PERCOCET) 5-325 MG per tablet Take 1 tablet by mouth every 4 (four) hours as needed.    [name] rOPINIRole (REQUIP XL) 2 MG 24  hr tablet Take 2 mg by mouth at bedtime.    . sodium bicarbonate 650 MG tablet Take 650 mg by mouth 2 (two) times daily.   [name]. warfarin (COUMADIN) 2 MG tablet Take 2 mg by mouth daily.      Review of Systems  Constitutional: Positive for fatigue.  HENT: Negative.   Eyes: Negative.   Respiratory: Negative.   Cardiovascular: Negative.   Gastrointestinal: Negative.   Musculoskeletal: Positive for gait problem and joint swelling.  Skin: Negative.   Neurological: Positive for weakness.       Problems with motor coordination and gait.  Psychiatric/Behavioral: Negative.   All other systems reviewed and  are negative.   BP 153/86  Pulse 69  Ht 5\' 9"  (1.753 m)  Wt 203 lb (92.08 kg)  BMI 29.96 kg/m2  Physical Exam  Nursing note and vitals reviewed. Constitutional: She is oriented to person, place, and time. She appears well-developed and well-nourished.  HENT:  Head: Normocephalic.  Nose: Nose normal.  Mouth/Throat: Oropharynx is clear and moist.  Eyes: Conjunctivae are normal. Pupils are equal, round, and reactive to light.  Neck: Normal range of motion. Neck supple. No JVD present.  Cardiovascular: Normal rate, regular rhythm, S1 normal, S2 normal, normal heart sounds and intact distal pulses.  Exam reveals no gallop and no friction rub.   No murmur heard. Pulmonary/Chest: Effort normal and breath sounds normal. No respiratory distress. She has no wheezes. She has no rales. She exhibits no tenderness.  Abdominal: Soft. Bowel sounds are normal. She exhibits no distension. There is no tenderness.  Musculoskeletal: Normal range of motion. She exhibits no edema and no tenderness.  Lymphadenopathy:    She has no cervical adenopathy.  Neurological: She is alert and oriented to person, place, and time. Coordination abnormal.  Skin: Skin is warm and dry. No rash noted. No erythema.  Psychiatric: She has a normal mood and affect. Her behavior is normal. Judgment and thought content normal.    Assessment and Plan

## 2014-04-04 NOTE — Assessment & Plan Note (Signed)
Currently on warfarin. We will increase the warfarin up to 3 mg daily, 4 mg on Monday Wednesday and Friday. Recent INR 1.7

## 2014-04-04 NOTE — Assessment & Plan Note (Signed)
Chronic fatigue. He was very tired. Poor sleep likely from stress

## 2014-04-04 NOTE — Assessment & Plan Note (Signed)
She appears relatively euvolemic on today's visit. She does drink significant fluids at home. Takes Lasix as needed.

## 2014-05-21 ENCOUNTER — Ambulatory Visit: Payer: Medicare Other | Admitting: Cardiovascular Disease

## 2014-06-09 ENCOUNTER — Ambulatory Visit (INDEPENDENT_AMBULATORY_CARE_PROVIDER_SITE_OTHER): Payer: Medicare Other | Admitting: Cardiovascular Disease

## 2014-06-09 ENCOUNTER — Encounter: Payer: Self-pay | Admitting: Cardiovascular Disease

## 2014-06-09 VITALS — BP 110/62 | HR 71 | Ht 69.0 in | Wt 206.0 lb

## 2014-06-09 DIAGNOSIS — I509 Heart failure, unspecified: Secondary | ICD-10-CM

## 2014-06-09 DIAGNOSIS — Z950 Presence of cardiac pacemaker: Secondary | ICD-10-CM

## 2014-06-09 DIAGNOSIS — I4891 Unspecified atrial fibrillation: Secondary | ICD-10-CM

## 2014-06-09 DIAGNOSIS — R0602 Shortness of breath: Secondary | ICD-10-CM

## 2014-06-09 DIAGNOSIS — I5032 Chronic diastolic (congestive) heart failure: Secondary | ICD-10-CM

## 2014-06-09 DIAGNOSIS — R609 Edema, unspecified: Secondary | ICD-10-CM

## 2014-06-09 NOTE — Assessment & Plan Note (Signed)
We'll have her start Lasix daily for one week and then as needed for symptoms of shortness of breath and leg edema

## 2014-06-09 NOTE — Assessment & Plan Note (Signed)
Chronic atrial fibrillation, paced rhythm, tolerating anticoagulation

## 2014-06-09 NOTE — Assessment & Plan Note (Addendum)
Increasing lower extremity, ankle edema. Increasing shortness of breath. Recommended she take Lasix 20 mg daily, decrease her fluid intake for least one week. Continue Lasix as needed after that for symptom relief. She will take with low dose potassium

## 2014-06-09 NOTE — Patient Instructions (Signed)
Please take lasix daily for the next week With potassium Hold the lasix once you feel better breathing  Please call us if you have new issues that need to be addressed before your next appt.  Your physician wants you to follow-up in: 3 months.  You will receive a reminder letter in the mail two months in advance. If you don't receive a letter, please call our office to schedule the follow-up appointment.

## 2014-06-09 NOTE — Assessment & Plan Note (Signed)
Shortness of breath most likely secondary to acute on chronic diastolic CHF. Symptoms persist despite Lasix and weight loss, would order an echocardiogram to evaluate her prosthetic valve

## 2014-06-09 NOTE — Progress Notes (Signed)
Patient ID: Rhonda Griffith, female    DOB: [birth year], 68 y.o.   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 At that time  her Coumadin was briefly held. Her INR at that time was more than 4. Notes indicate a history of sick sinus syndrome for which a pacemaker was placed. History of bilateral knee replacements   Previous spinal tap at [facility] for high pressure in her ventricle. She reports no significant improvement after this procedure (drain?).  In followup, she reports having increasing shortness of breath over the past several months. Weight has trended up slowly. She has had worsening lower extremity edema, socks are tight. She's not taking her Lasix. She does drink significant fluids in the daytime Recently had Botox for her bladder Tolerating anticoagulation She is walking with her walker on a regular basis. Legs are weak at baseline. No recent falls In general She spends significant time playing on the computer, husband also interrupts her sleep as he has a severe underlying lung condition.  She does take a pain pill occasionally to sleep better. She does have Valium for anxiety. Also has severe bilateral knee pain.  Statin was held in the past for leg pain, Zocor. She has diagnosed obstructive sleep apnea but unable to tolerate CPAP.  Placement of a bladder stimulator implant for overactive bladder was denied despite cardiac clearance .  Total cholesterol 184, LDL 98, HDL 63  EKG shows paced rhythm, rate 71 beats per minute  Outpatient Encounter Prescriptions as of 06/09/2014  Medication Sig  . albuterol (PROVENTIL HFA;VENTOLIN HFA) 108 (90 BASE) MCG/ACT inhaler Inhale 2 puffs into the  lungs every 6 (six) hours as needed for wheezing.  [name] aspirin 81 MG EC tablet Take 81 mg by mouth daily.    . Calcium Carbonate-Vitamin D (OSCAL 500/200 D-3 PO) Take by mouth 2 (two) times daily.  . Cholecalciferol (VITAMIN D3) 1000 UNITS tablet Take 1,000 Units by mouth daily. Take twice a month.  . cyclobenzaprine (FLEXERIL) 10 MG tablet Take 10 mg by mouth at bedtime.   [name] DIAZEPAM PO Take 0.5 mg by mouth as needed.  . diclofenac sodium (VOLTAREN) 1 % GEL Apply 1 application topically 2 (two) times daily.    [name] diltiazem (DILACOR XR) 180 MG 24 hr capsule Take 180 mg by mouth daily.  Tery Sanfilippo Calcium (STOOL SOFTENER PO) Take 1 tablet by mouth 2 (two) times daily.  . DULoxetine (CYMBALTA) 60 MG capsule Take 60 mg by mouth daily.  . furosemide (LASIX) 20 MG tablet Take 20 mg by mouth as needed.  . magnesium oxide (MAG-OX) 400 MG tablet Take 800 mg by mouth 2 (two) times daily.   . meclizine (ANTIVERT) 25 MG tablet Take 25 mg by mouth as needed.   . metoprolol tartrate (LOPRESSOR) 25 MG tablet TAKE 1 TABLET BY MOUTH TWICE DAILY AS NEEDED  . Multiple Vitamin (MULTIVITAMIN) tablet Take 1 tablet by mouth daily.  [name] omeprazole (PRILOSEC) 20 MG capsule Take 20 mg by mouth daily.    [name] oxyCODONE-acetaminophen (PERCOCET) 5-325 MG per tablet Take 1 tablet by mouth every 4 (four) hours as needed.    [name] rOPINIRole (REQUIP XL) 2 MG 24 hr tablet Take  2 mg by mouth at bMarland Kitchenedtime.    . sodium bicarbonate 650 MG tablet Take 650 mg by mouth 2 (two) times daily.   . warfarin (COUMADIN) 2 MG tablet Take 2 mg by mouth daily.      Review of Systems  HENT: Negative.   Eyes: Negative.   Respiratory: Positive for shortness of breath.   Cardiovascular: Negative.   Gastrointestinal: Negative.   Endocrine: Negative.   Musculoskeletal: Positive for gait problem and joint swelling.  Skin: Negative.   Allergic/Immunologic: Negative.   Neurological: Positive for weakness.       Problems with motor coordination and gait.   Hematological: Negative.   Psychiatric/Behavioral: Negative.   All other systems reviewed and are negative.   BP 110/62  Pulse 71  Ht  (1.753 m)  Wt 206 lb (93.441 kg)  BMI 30.41 kg/m2  Physical Exam  Nursing note and vitals reviewed. Constitutional: She is oriented to person, place, and time. She appears well-developed and well-nourished.  HENT:  Head: Normocephalic.  Nose: Nose normal.  Mouth/Throat: Oropharynx is clear and moist.  Eyes: Conjunctivae are normal. Pupils are equal, round, and reactive to light.  Neck: Normal range of motion. Neck supple. No JVD present.  Cardiovascular: Normal rate, regular rhythm, S1 normal, S2 normal, normal heart sounds and intact distal pulses.  Exam reveals no gallop and no friction rub.   No murmur heard. Pulmonary/Chest: Effort normal and breath sounds normal. No respiratory distress. She has no wheezes. She has no rales. She exhibits no tenderness.  Abdominal: Soft. Bowel sounds are normal. She exhibits no distension. There is no tenderness.  Musculoskeletal: Normal range of motion. She exhibits no edema and no tenderness.  Lymphadenopathy:    She has no cervical adenopathy.  Neurological: She is alert and oriented to person, place, and time. Coordination abnormal.  Skin: Skin is warm and dry. No rash noted. No erythema.  Psychiatric: She has a normal mood and affect. Her behavior is normal. Judgment and thought content normal.    Assessment and Plan

## 2014-06-09 NOTE — Assessment & Plan Note (Signed)
Followed by EP, paced rhythm

## 2014-06-18 ENCOUNTER — Telehealth: Payer: Self-pay

## 2014-06-18 NOTE — Telephone Encounter (Signed)
Spoke w/ pt.  She reports that her wt was down to 201 after her last ov. She is taking lasix  daily, but her wt is up to 203.   She is having worsening SOB.  Advised pt that I received a call this am regarding her husband having similar sx. Discussed their diet and she admits that they both ate BLTs last night, they have had soup from a box, caesar salad, and lasagna from restaurant in the last few days.  Had discussion about their diet and advised her that I will give them a low sodium diet and teach them how to read nutrition labels at their next ov.  She would like to know if she should increase her lasix dose today.  Please advise.  Thank you.

## 2014-06-18 NOTE — Telephone Encounter (Signed)
Pt called, left vm, states her BP has not gone down, is 154/89 HR, 91. States she is on Lasix and has actually gained weight. Please call.

## 2014-06-18 NOTE — Telephone Encounter (Signed)
Sounds like she needed extra Lasix dose or 2 to get her weight down Would recommend she keep her fluids also down She could take 2 Lasix in the morning, if desperate to additional 2 Lasix in the afternoon but only very sparingly

## 2014-06-19 NOTE — Telephone Encounter (Signed)
Spoke w/ pt.  Advised her of Dr. Gollan's recommendation.  She verbalizes understanding and will call back w/ any further questions or concerns.  

## 2014-06-24 DIAGNOSIS — Z96653 Presence of artificial knee joint, bilateral: Secondary | ICD-10-CM | POA: Insufficient documentation

## 2014-07-08 ENCOUNTER — Ambulatory Visit: Payer: Self-pay | Admitting: Emergency Medicine

## 2014-07-08 LAB — CBC WITH DIFFERENTIAL/PLATELET
Basophil #: 0 10*3/uL (ref 0.0–0.1)
Basophil %: 0.2 %
Eosinophil #: 0 10*3/uL (ref 0.0–0.7)
Eosinophil %: 0.3 %
HCT: 39.2 % (ref 35.0–47.0)
HGB: 12.5 g/dL (ref 12.0–16.0)
Lymphocyte #: 0.6 10*3/uL — ABNORMAL LOW (ref 1.0–3.6)
Lymphocyte %: 3.6 %
MCH: 28.4 pg (ref 26.0–34.0)
MCHC: 31.8 g/dL — ABNORMAL LOW (ref 32.0–36.0)
MCV: 89 fL (ref 80–100)
Monocyte #: 1.3 x10 3/mm — ABNORMAL HIGH (ref 0.2–0.9)
Monocyte %: 8.2 %
Neutrophil #: 13.8 10*3/uL — ABNORMAL HIGH (ref 1.4–6.5)
Neutrophil %: 87.7 %
Platelet: 307 10*3/uL (ref 150–440)
RBC: 4.4 10*6/uL (ref 3.80–5.20)
RDW: 14.3 % (ref 11.5–14.5)
WBC: 15.7 10*3/uL — ABNORMAL HIGH (ref 3.6–11.0)

## 2014-07-23 ENCOUNTER — Encounter: Payer: Self-pay | Admitting: *Deleted

## 2014-08-07 ENCOUNTER — Other Ambulatory Visit: Payer: Self-pay | Admitting: Cardiovascular Disease

## 2014-09-08 ENCOUNTER — Ambulatory Visit (INDEPENDENT_AMBULATORY_CARE_PROVIDER_SITE_OTHER): Payer: Medicare Other | Admitting: Cardiovascular Disease

## 2014-09-08 ENCOUNTER — Encounter: Payer: Self-pay | Admitting: Cardiovascular Disease

## 2014-09-08 VITALS — BP 140/72 | HR 74 | Ht 69.0 in | Wt 197.5 lb

## 2014-09-08 DIAGNOSIS — I4891 Unspecified atrial fibrillation: Secondary | ICD-10-CM

## 2014-09-08 DIAGNOSIS — R2681 Unsteadiness on feet: Secondary | ICD-10-CM

## 2014-09-08 DIAGNOSIS — R0602 Shortness of breath: Secondary | ICD-10-CM

## 2014-09-08 DIAGNOSIS — I5032 Chronic diastolic (congestive) heart failure: Secondary | ICD-10-CM

## 2014-09-08 DIAGNOSIS — Z954 Presence of other heart-valve replacement: Secondary | ICD-10-CM

## 2014-09-08 DIAGNOSIS — Z952 Presence of prosthetic heart valve: Secondary | ICD-10-CM

## 2014-09-08 NOTE — Assessment & Plan Note (Signed)
Suspect symptoms are multifactorial. Obesity, deconditioning, arrhythmia with atrial fibrillation, paced rhythm, suspect also diastolic CHF. We'll restart Lasix, echocardiogram ordered

## 2014-09-08 NOTE — Assessment & Plan Note (Signed)
Currently on warfarin. Paced rhythm

## 2014-09-08 NOTE — Patient Instructions (Addendum)
You are doing well. No medication changes were made.  Ok to take lasix 1 to 2 every morning as needed for shortness of breath Also take with potassium  We will order an echocardiogram to look at the mitral valve and causes for shortness of breath  Please call us if you have new issues that need to be addressed before your next appt.  Your physician wants you to follow-up in: 6 months.  You will receive a reminder letter in the mail two months in advance. If you don't receive a letter, please call our office to schedule the follow-up appointment.

## 2014-09-08 NOTE — Progress Notes (Signed)
Patient ID: Rhonda Griffith, female    DOB: 05/24/1946, 68 y.o.   MRN: [id number]  HPI Comments: 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 At that time  her Coumadin was briefly held. Her INR at that time was more than 4. Notes indicate a history of sick sinus syndrome for which a pacemaker was placed. History of bilateral knee replacements She presents today for follow-up of her atrial fibrillation and shortness of breath. INR is managed by primary care History of sleep apnea, not on CPAP  She reports that she continues to have shortness of breath. On her last visit she had similar symptoms and we recommended that she take Lasix daily for 1 week. She did this and reports having mild improvement of her symptoms. She has since stopped the Lasix and symptoms have recurred. Possibly with ankle edema, not significant. She does drink significant fluids in the daytime. No recent chest pain. She does report having significant hematoma on the back of her leg, posterior thigh. No trauma to the area No recent echocardiogram available Tolerating anticoagulation She is walking with her walker on a regular basis. Legs are weak at baseline. No recent falls She spends significant time playing on the computer  She does take a pain pill occasionally to sleep better. She does have Valium for anxiety. Also has severe bilateral knee pain.  Statin was held in the past for leg pain, Zocor.  EKG on today's visit shows paced rhythm, 64 bpm   other past medical history [facility] pressure in her ventricle. She reports no significant improvement after this procedure (drain?).     Placement of a bladder stimulator  implant for overactive bladder was denied despite cardiac clearance .  Total cholesterol 184, LDL 98, HDL 63    Outpatient Encounter Prescriptions as of 09/08/2014  Medication Sig  . albuterol (PROVENTIL HFA;VENTOLIN HFA) 108 (90 BASE) MCG/ACT inhaler Inhale 2 puffs into the lungs every 6 (six) hours as needed for wheezing.  [name]. aspirin 81 MG EC tablet Take 81 mg by mouth daily.    . Calcium Carbonate-Vitamin D (OSCAL 500/200 D-3 PO) Take by mouth 2 (two) times daily.  . Cholecalciferol (VITAMIN D3) 1000 UNITS tablet Take 1,000 Units by mouth daily. Take twice a month.  . cyclobenzaprine (FLEXERIL) 10 MG tablet Take 10 mg by mouth at bedtime.   [name]. DIAZEPAM PO Take 0.5 mg by mouth as needed.  . diclofenac sodium (VOLTAREN) 1 % GEL Apply 1 application topically 2 (two) times daily.    [name]. DILT-XR 180 MG 24 hr capsule TAKE ONE CAPSULE BY MOUTH EVERY DAY  . diltiazem (DILACOR XR) 180 MG 24 hr capsule Take 180 mg by mouth daily.  Tery Sanfilippo. Docusate Calcium (STOOL SOFTENER PO) Take 1 tablet by mouth 2 (two) times daily.  . DULoxetine (CYMBALTA) 60 MG capsule Take 60 mg by mouth daily.  . furosemide (LASIX) 20 MG tablet Take 20 mg by mouth as needed.  . magnesium oxide (MAG-OX) 400 MG tablet Take 800 mg by mouth 2 (two) times daily.   . meclizine (ANTIVERT) 25 MG tablet Take 25 mg by mouth as needed.   . metoprolol tartrate (LOPRESSOR) 25 MG tablet TAKE  1 TABLET BY MOUTH TWICE DAILY AS NEEDED  . Multiple Vitamin (MULTIVITAMIN) tablet Take 1 tablet by mouth daily.  [name]. omeprazole (PRILOSEC) 20 MG capsule Take 20 mg by mouth daily.    [name]. oxyCODONE-acetaminophen (PERCOCET) 5-325 MG per tablet Take 1 tablet by mouth every 4 (four) hours as needed.    [name]. rOPINIRole (REQUIP XL) 2 MG 24 hr tablet Take 2 mg by mouth at bedtime.    . sodium bicarbonate 650 MG tablet Take 650 mg by mouth 2 (two) times daily.   [name]. warfarin (COUMADIN) 2 MG tablet Take 2 mg by mouth daily.    Social history  reports that she quit smoking  about 43 years ago. Her smoking use included Cigarettes. She has a 5 pack-year smoking history. She does not have any smokeless tobacco history on file. She reports that she does not drink alcohol or use illicit drugs.  Review of Systems  Respiratory: Positive for shortness of breath.   Cardiovascular: Negative.   Gastrointestinal: Negative.   Musculoskeletal: Positive for joint swelling and gait problem.  Neurological: Positive for weakness.       Problems with motor coordination and gait.  Hematological: Negative.   Psychiatric/Behavioral: Negative.   All other systems reviewed and are negative.   BP 140/72 mmHg  Pulse 74  Ht 5\' 9"  (1.753 m)  Wt 197 lb 8 oz (89.585 kg)  BMI 29.15 kg/m2  Physical Exam  Constitutional: She is oriented to person, place, and time. She appears well-developed and well-nourished.  Leg weakness  HENT:  Head: Normocephalic.  Nose: Nose normal.  Mouth/Throat: Oropharynx is clear and moist.  Eyes: Conjunctivae are normal. Pupils are equal, round, and reactive to light.  Neck: Normal range of motion. Neck supple. No JVD present.  Cardiovascular: Normal rate, regular rhythm, S1 normal, S2 normal, normal heart sounds and intact distal pulses.  Exam reveals no gallop and no friction rub.   No murmur heard. Pulmonary/Chest: Effort normal and breath sounds normal. No respiratory distress. She has no wheezes. She has no rales. She exhibits no tenderness.  Abdominal: Soft. Bowel sounds are normal. She exhibits no distension. There is no tenderness.  Musculoskeletal: Normal range of motion. She exhibits no edema or tenderness.  Lymphadenopathy:    She has no cervical adenopathy.  Neurological: She is alert and oriented to person, place, and time. Coordination normal.  Skin: Skin is warm and dry. No rash noted. No erythema.  Psychiatric: She has a normal mood and affect. Her behavior is normal. Judgment and thought content normal.    Assessment and Plan   Nursing note and vitals reviewed.

## 2014-09-08 NOTE — Assessment & Plan Note (Signed)
Walks with a walker. No recent falls. History of hematoma, traumatic.

## 2014-09-08 NOTE — Assessment & Plan Note (Addendum)
Echocardiogram has been ordered for shortness of breath. Recommended she restart her Lasix daily for presumed diastolic CHF Shortness of breath symptoms could be secondary to underlying arrhythmia, paced rhythm

## 2014-09-30 ENCOUNTER — Other Ambulatory Visit (INDEPENDENT_AMBULATORY_CARE_PROVIDER_SITE_OTHER): Payer: Medicare Other

## 2014-09-30 ENCOUNTER — Other Ambulatory Visit: Payer: Self-pay

## 2014-09-30 DIAGNOSIS — Z954 Presence of other heart-valve replacement: Secondary | ICD-10-CM

## 2014-09-30 DIAGNOSIS — I4891 Unspecified atrial fibrillation: Secondary | ICD-10-CM

## 2014-09-30 DIAGNOSIS — I5032 Chronic diastolic (congestive) heart failure: Secondary | ICD-10-CM

## 2014-09-30 DIAGNOSIS — R0602 Shortness of breath: Secondary | ICD-10-CM

## 2014-09-30 DIAGNOSIS — Z952 Presence of prosthetic heart valve: Secondary | ICD-10-CM

## 2014-10-01 ENCOUNTER — Encounter: Payer: Self-pay | Admitting: *Deleted

## 2014-10-10 ENCOUNTER — Ambulatory Visit (INDEPENDENT_AMBULATORY_CARE_PROVIDER_SITE_OTHER): Payer: Medicare Other | Admitting: *Deleted

## 2014-10-10 DIAGNOSIS — R000 Tachycardia, unspecified: Secondary | ICD-10-CM

## 2014-10-10 DIAGNOSIS — I495 Sick sinus syndrome: Secondary | ICD-10-CM

## 2014-10-10 LAB — MDC_IDC_ENUM_SESS_TYPE_INCLINIC
Battery Impedance: 2575 Ohm
Battery Voltage: 2.75 V
Date Time Interrogation Session: 20160115111612
Lead Channel Impedance Value: 0 Ohm
Lead Channel Impedance Value: 504 Ohm
Lead Channel Pacing Threshold Amplitude: 0.5 V
Lead Channel Pacing Threshold Pulse Width: 0.4 ms
Lead Channel Sensing Intrinsic Amplitude: 11.2 mV
Lead Channel Setting Pacing Amplitude: 2 V
Lead Channel Setting Pacing Pulse Width: 0.4 ms
Lead Channel Setting Sensing Sensitivity: 4 mV
MDC IDC MSMT BATTERY REMAINING LONGEVITY: 29 mo
MDC IDC STAT BRADY RV PERCENT PACED: 81 %

## 2014-10-10 NOTE — Progress Notes (Signed)
PPM check in office. 

## 2014-10-24 ENCOUNTER — Other Ambulatory Visit: Payer: Self-pay

## 2014-10-24 MED ORDER — POTASSIUM CHLORIDE ER 10 MEQ PO TBCR: 10.0000 meq | EXTENDED_RELEASE_TABLET | Freq: Every day | ORAL | Status: DC

## 2014-10-27 ENCOUNTER — Encounter: Payer: Self-pay | Admitting: Internal Medicine

## 2014-11-03 ENCOUNTER — Telehealth: Payer: Self-pay | Admitting: *Deleted

## 2014-11-03 NOTE — Telephone Encounter (Signed)
Faxed clearance to [facility] at [phone].

## 2014-11-03 NOTE — Telephone Encounter (Signed)
Request for surgical clearance:  1. What type of surgery is being performed? Knee surgery   2. When is this surgery scheduled? Not yet, they need a letter to see if she is okay to do this.   3. Are there any medications that need to be held prior to surgery and how long? Coumadin for about 5 days prior  4. Name of physician performing surgery? Dr.[facility]   5. What is your office phone and fax number? Office number is [phone] and fax number [phone].

## 2014-11-03 NOTE — Telephone Encounter (Signed)
Please have patient stop Coumadin 5 days prior to procedure. She will need to bridge with Lovenox 1 mg/kg bid, starting two days after stopping the Coumadin and then discontinuing the Lovenox 24 hours prior to the surgery. She may resume Lovenox 1 mg/kg and Coumadin once deemed safe by her doctors (ideally 24 hours post-op). She will continue Lovenox until therapeutic INR is reached. She may then discontinue Lovenox.   At last office visit in December patient weighed 29 kg, thus her dosing would 29 mg bid.  She should plan to have short term follow up with her Coumadin clinic.

## 2014-11-06 ENCOUNTER — Telehealth: Payer: Self-pay

## 2014-11-06 NOTE — Telephone Encounter (Signed)
I do not follow pt's Coumadin in our clinic.  Pt's primary MD manages and monitors pt's Coumadin.  Dr Mariah MillingGollan to give clearance to hold Coumadin, then advise pt's primary MD who manages pt's coumadin Dr Perley JainShapely-Quinn.  Thanks

## 2014-11-06 NOTE — Telephone Encounter (Signed)
Spoke w/ pt.  She will have ordering MD fax over cardiac clearance request w/ name of procedure and MD recommendation on coumadin.

## 2014-11-06 NOTE — Telephone Encounter (Signed)
Pt called, states she is having a procedure done to her back and has to be off coumadin, pt asks if she should start Lovenox. Please call and advise.

## 2014-11-06 NOTE — Telephone Encounter (Signed)
We need to know what the procedure is and how long they want her off warfarin prior to the procedure If only for a short period of time, may not need Lovenox   if a long period of time, risk of stroke goes up and may want Lovenox before and after the procedure

## 2014-11-08 ENCOUNTER — Ambulatory Visit: Payer: Self-pay | Admitting: Family Medicine

## 2014-11-08 ENCOUNTER — Inpatient Hospital Stay: Payer: Self-pay | Admitting: Specialist

## 2014-11-24 ENCOUNTER — Inpatient Hospital Stay: Payer: Self-pay | Admitting: Internal Medicine

## 2014-11-27 ENCOUNTER — Encounter: Payer: Self-pay | Admitting: Physician Assistant

## 2014-11-27 DIAGNOSIS — I482 Chronic atrial fibrillation: Secondary | ICD-10-CM | POA: Diagnosis not present

## 2014-11-27 DIAGNOSIS — N183 Chronic kidney disease, stage 3 (moderate): Secondary | ICD-10-CM

## 2014-11-27 DIAGNOSIS — M7981 Nontraumatic hematoma of soft tissue: Secondary | ICD-10-CM

## 2014-12-09 ENCOUNTER — Encounter: Payer: Self-pay | Admitting: Cardiovascular Disease

## 2014-12-09 ENCOUNTER — Ambulatory Visit (INDEPENDENT_AMBULATORY_CARE_PROVIDER_SITE_OTHER): Payer: Medicare Other | Admitting: Cardiovascular Disease

## 2014-12-09 VITALS — BP 140/60 | Ht 69.0 in | Wt 201.5 lb

## 2014-12-09 DIAGNOSIS — I5032 Chronic diastolic (congestive) heart failure: Secondary | ICD-10-CM

## 2014-12-09 DIAGNOSIS — Z950 Presence of cardiac pacemaker: Secondary | ICD-10-CM

## 2014-12-09 DIAGNOSIS — I10 Essential (primary) hypertension: Secondary | ICD-10-CM | POA: Insufficient documentation

## 2014-12-09 DIAGNOSIS — R2681 Unsteadiness on feet: Secondary | ICD-10-CM | POA: Diagnosis not present

## 2014-12-09 DIAGNOSIS — I4891 Unspecified atrial fibrillation: Secondary | ICD-10-CM | POA: Diagnosis not present

## 2014-12-09 MED ORDER — APIXABAN 5 MG PO TABS: 5.0000 mg | ORAL_TABLET | Freq: Two times a day (BID) | ORAL | Status: DC

## 2014-12-09 NOTE — Assessment & Plan Note (Signed)
She has follow-up with Dr. Graciela HusbandsKlein in April 2016

## 2014-12-09 NOTE — Progress Notes (Signed)
Patient ID: Rhonda LexVirginia Eleanor Griffith, female    DOB: 12/12/1945, 69 y.o.   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 At that time  her Coumadin was briefly held. Her INR at that time was more than 4. Notes indicate a history of sick sinus syndrome for which a pacemaker was placed. History of bilateral knee replacements She presents today for follow-up of her atrial fibrillation and shortness of breath. History of sleep apnea, not on CPAP  In follow-up today, she reports having recent bronchitis February 2016. At that time she woke up with right flank hematoma, possibly from heavy coughing or hitting a bed rail. She spent 3 days in the hospital and in the past month at rehabilitation, [facility]. In follow-up today she presents in a wheelchair, reports that she wants to go home, legs are doing better, she is ambulating, able to climb stairs. No further bleeding. She is taking aspirin 81 mg daily, warfarin was not restarted. Denies having any significant shortness of breath. Currently taking Lasix when necessary  EKG on today's visit shows paced rhythm  Other past medical history Previously had shortness of breath, improved with Lasix.  Previously at home, She spends significant time playing on the computer  She does take a pain pill occasionally to sleep better.  Valium for anxiety. severe bilateral knee pain.  Statin was held in the past for leg pain, Zocor.  Previous [facility] at [facility] for high pressure in her ventricle. She reports no significant improvement after this procedure (drain?).  Placement of a bladder stimulator implant for overactive bladder was denied despite cardiac clearance . Total  cholesterol 184, LDL 98, HDL 63    Allergies  Allergen Reactions  . Morphine   . Tape     Allergic to plastic/latex tape. Only use paper tape on patient.  . Cephalexin Rash    Outpatient Encounter Prescriptions as of 12/09/2014  Medication Sig  . albuterol (PROVENTIL HFA;VENTOLIN HFA) 108 (90 BASE) MCG/ACT inhaler Inhale 2 puffs into the lungs every 6 (six) hours as needed for wheezing.  . budesonide-formoterol (SYMBICORT) 160-4.5 MCG/ACT inhaler Inhale 2 puffs into the lungs 2 (two) times daily.  . calcium-vitamin D (OSCAL-500) 500-400 MG-UNIT per tablet Take 1 tablet by mouth 2 (two) times daily.  . Cholecalciferol (VITAMIN D3) 1000 UNITS CAPS Take 2,000 Units by mouth every 30 (thirty) days.   . cyclobenzaprine (FLEXERIL) 10 MG tablet Take 10 mg by mouth at bedtime.   [name]. DIAZEPAM PO Take 5 mg by mouth as needed.   . diclofenac sodium (VOLTAREN) 1 % GEL Apply 1 application topically 2 (two) times daily.    [name]. DILT-XR 180 MG 24 hr capsule TAKE ONE CAPSULE BY MOUTH EVERY DAY  . Docusate Calcium (STOOL SOFTENER PO) Take 1 tablet by mouth 2 (two) times daily.  . DULoxetine (CYMBALTA) 60 MG capsule Take 60 mg by mouth daily.  . fluticasone (FLONASE) 50 MCG/ACT nasal spray Place into both nostrils daily.  . furosemide (LASIX) 20 MG tablet Take 20 mg by mouth daily as needed.   [name]. guaiFENesin (MUCINEX) 600 MG 12 hr tablet Take 600 mg by mouth 2 (two) times daily.  . magnesium oxide (MAG-OX) 400 MG  tablet Take 400 mg by mouth 2 (two) times daily.   . Multiple Vitamin (MULTIVITAMIN) tablet Take 1 tablet by mouth daily.  [name] omeprazole (PRILOSEC) 20 MG capsule Take 20 mg by mouth daily.    [name] oxyCODONE-acetaminophen (PERCOCET) 5-325 MG per tablet Take 1 tablet by mouth every 4 (four) hours as needed.    . potassium chloride (K-DUR) 10 MEQ tablet Take 1 tablet (10 mEq total) by mouth daily. Take with lasix  . rOPINIRole (REQUIP XL) 2 MG 24 hr tablet Take 2 mg by mouth at bedtime.    . sodium  bicarbonate 650 MG tablet Take 650 mg by mouth 2 (two) times daily.   . [DISCONTINUED] aspirin 81 MG EC tablet Take 81 mg by mouth daily.    [name] apixaban (ELIQUIS) 5 MG TABS tablet Take 1 tablet (5 mg total) by mouth 2 (two) times daily.  . [DISCONTINUED] meclizine (ANTIVERT) 25 MG tablet Take 25 mg by mouth as needed.   . [DISCONTINUED] warfarin (COUMADIN) 2 MG tablet Take 2 mg by mouth daily.     Past Medical History  Diagnosis Date  . Atrial fibrillation, permanent   . Restless leg syndrome   . Pacemaker -[employer]     implant for SSS  . 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 Leukocytosis   . Chronic kidney disease, stage I     Past Surgical History  Procedure Laterality Date  . Insert / replace / remove pacemaker      implant for SSS  . Mitral valve replacement      s/p mechanical valve replaced with bovine valve 2010  . Melanoma excision      face  . Bladder injection    . Nasal sinus surgery    . Botox bladder      Social History  reports that she quit smoking about 43 years ago. Her smoking use included Cigarettes. She has a 5 pack-year smoking history. She does not have any smokeless tobacco history on file. She reports that she does not drink alcohol or use illicit drugs.  Family History Family history is unknown by patient.   Review of Systems  Constitutional: Negative.   Respiratory: Negative.   Cardiovascular: Negative.   Gastrointestinal: Negative.   Musculoskeletal: Positive for joint swelling and gait problem.  Neurological:       Problems with motor coordination and gait.  Hematological: Negative.   Psychiatric/Behavioral: Negative.   All other systems reviewed and are negative.   BP 140/60 mmHg  Ht  (1.753 m)  Wt 201 lb 8 oz (91.4 kg)  BMI 29.74 kg/m2  Physical Exam  Constitutional: She is oriented to person, place, and time. She appears well-developed and  well-nourished.  Leg weakness  HENT:  Head: Normocephalic.  Nose: Nose normal.  Mouth/Throat: Oropharynx is clear and moist.  Eyes: Conjunctivae are normal. Pupils are equal, round, and reactive to light.  Neck: Normal range of motion. Neck supple. No JVD present.  Cardiovascular: Normal rate, regular rhythm, S1 normal, S2 normal, normal heart sounds and intact distal pulses.  Exam reveals no gallop and no friction rub.   No murmur heard. Pulmonary/Chest: Effort normal and breath sounds normal. No respiratory distress. She has no wheezes. She has no rales. She exhibits no tenderness.  Abdominal: Soft. Bowel sounds are normal. She exhibits no distension. There is no tenderness.  Musculoskeletal: Normal range of motion. She exhibits  no edema or tenderness.  Lymphadenopathy:    She has no cervical adenopathy.  Neurological: She is alert and oriented to person, place, and time. Coordination normal.  Skin: Skin is warm and dry. No rash noted. No erythema.  Psychiatric: She has a normal mood and affect. Her behavior is normal. Judgment and thought content normal.    Assessment and Plan  Nursing note and vitals reviewed.

## 2014-12-09 NOTE — Patient Instructions (Addendum)
You are doing well.  Stop aspirin Start eliquis 5 mg twice a day  Call the office for any signs of bleeding  Please call us if you have new issues that need to be addressed before your next appt.  Your physician wants you to follow-up in: 3 months.  You will receive a reminder letter in the mail two months in advance. If you don't receive a letter, please call our office to schedule the follow-up appointment.

## 2014-12-09 NOTE — Assessment & Plan Note (Signed)
Recommended that she stay on her Lasix 20 mg daily. Denies having shortness of breath on today's visit. Fluid intake likely limited at the nursing home

## 2014-12-09 NOTE — Assessment & Plan Note (Signed)
Blood pressure well controlled on her current medication regimen

## 2014-12-09 NOTE — Assessment & Plan Note (Addendum)
Chronic atrial fibrillation. We have recommended she stop the aspirin, start eliquis 5 mg twice a day No recent falls. Rare episodes of hematoma, most recently February 2016.  Echocardiogram January 2016 showing mild to moderate pulmonary hypertension, moderately dilated left atrium

## 2014-12-09 NOTE — Assessment & Plan Note (Signed)
She has completed one month of rehabilitation, doing well, no recent falls. Gait is stable. She would like to go home ASAP.

## 2014-12-25 ENCOUNTER — Other Ambulatory Visit: Payer: Self-pay | Admitting: *Deleted

## 2014-12-25 ENCOUNTER — Telehealth: Payer: Self-pay | Admitting: *Deleted

## 2014-12-25 MED ORDER — APIXABAN 5 MG PO TABS: 5.0000 mg | ORAL_TABLET | Freq: Two times a day (BID) | ORAL | Status: DC

## 2014-12-25 NOTE — Telephone Encounter (Signed)
Rx sent to local pharmacy.  

## 2014-12-25 NOTE — Telephone Encounter (Signed)
°  1. Which medications need to be refilled? Eliquis  2. Which pharmacy is medication to be sent to? [facility] in [place]  3. Do they need a 30 day or 90 day supply? 30 day   4. Would they like a call back once the medication has been sent to the pharmacy? No

## 2015-01-13 ENCOUNTER — Encounter: Payer: Medicare Other | Admitting: Internal Medicine

## 2015-01-25 NOTE — Discharge Summary (Signed)
PATIENT NAME:  Rhonda RankinsSCHUBERT, Jessenya E MR#:  [id number] DATE OF BIRTH:  1945/11/10  DATE OF ADMISSION:  11/08/2014 DATE OF DISCHARGE:  11/11/2014  ADMITTING COMPLAINT: Shortness of breath and cough.   DISCHARGE DIAGNOSES:  1.  Acute respiratory failure with hypoxia.  2.  Acute renal failure.  3.  Chronic atrial fibrillation.  4.  Acute bronchitis. 5.  Generalized weakness.  6.  Gastroesophageal reflux disease.  7.  History of mitral valve replacement with bovine valve. 8.  History of gastrointestinal bleed. 9.  Depression. 10.  Restless leg syndrome. 11.  Hyperlipidemia.    CONSULTATIONS: None.   PROCEDURES:  1.  Chest x-ray February 13 with stable chronic and postoperative changes of median sternotomy and mitral valve replacement. No acute disease.  2.  Chest x-ray February 14 shows no active cardiopulmonary disease.  3.  V/Q scan February 15 shows a very low probability of acute pulmonary embolus.   BRIEF HISTORY OF PRESENT ILLNESS: This 69 year old female [facility] due to shortness of breath, cough, and congestion getting progressively worse over the past few days. On presentation to urgent care, she was found to be wheezing and bronchospastic, as well as hypoxic. She received breathing treatments, but oxygen saturations continued to be in the low 80s on ambulation. She was sent to the hospital for direct admission.   HOSPITAL COURSE BY PROBLEM:  1.  Acute respiratory failure with hypoxia: This was most likely due to acute bronchitis versus possible COPD secondary to chronic heavy secondhand smoke exposure. She continued to be hypoxic throughout the hospitalization. On the morning of discharge, her oxygen saturation was 90% on 2 liters. On ambulation on room air, oxygen saturations continued to dip into the 80s. Her V/Q scan was negative for a PE. She was treated in hospital with nebulizers, steroids, and Levaquin. She is being discharged on home  oxygen, prednisone taper, and Levaquin to complete a 7 day course. She will also have Tussionex and Symbicort to be used twice a day, as well nebulizers to be used as needed. She will need to follow up with her primary care provider in the future when she is not having an acute exacerbation for pulmonary function testing and evaluation for possible chronic lung disease.  2.  Acute bronchitis: As above.  3.  Acute renal failure: Her baseline creatinine is not known, but on presentation she had an elevated creatinine of 1.68 with a GFR of 32. This improved with IV hydration to a GFR of 49, creatinine of 1.36. 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 This will need to be monitored by her primary care provider.  4.  Generalized weakness: The patient was evaluated by physical therapy and is going home with home health physical therapy and nursing.   DISCHARGE PHYSICAL EXAMINATION:  VITAL SIGNS: Temperature 98, pulse 76, respirations 16, blood pressure 116/66, oxygenation 92% on 2 liters nasal cannula.  GENERAL: No acute distress.  RESPIRATORY: She continues to have scattered wheezes, but with good air movement. No respiratory distress.  CARDIOVASCULAR: Regular rate and rhythm. No murmurs, rubs, or gallops. No peripheral edema. Peripheral pulses 2+.  PSYCHIATRIC: The patient is alert and oriented with good insight into her clinical condition. She is fairly anxious.   LABORATORY DATA: Sodium 138, potassium 4.0, chloride 102, bicarbonate 29, BUN 34, creatinine 1.36, glucose 158,000. Most recent white blood cells 5.7, hemoglobin 12.5, MCV 89, platelets 209,000. INR 2.5. Influenza was negative.   DISCHARGE MEDICATIONS:  1.  Acetaminophen 650 mg 1 tablet every 4 hours as needed for pain.  2.  Aspirin 81 mg 1 tablet daily.  3.  Magnesium oxide 400 mg 1 tablet twice a day.  4.  Cyclobenzaprine 10 mg 1 tablet once a day.  5.  Omeprazole 20 mg 1 tablet once a day.  6.  Multivitamin 1 tablet daily.  7.   Os-Cal 500 plus D 1 tablet twice a day.  8.  Colace 1 capsule 2 times a day.  9.  Sodium bicarbonate 650 mg 1 tablet twice a day.  10.  Diltiazem 180 mg 1 tablet once a day in the morning.  11.  Warfarin 2 mg 1 tablet once a day.  12.  Ropinirole 2 mg 1 tablet once a day.  13.  Vitamin D3 at 1000 international units 2 tablets once a month.  14.  Diazepam 5 mg 1 tablet once a day as needed for anxiety.  15.  Voltaren topical 1% topical gel apply to affected area twice a day.  16.  Furosemide 20 mg 1 tablet once a day as needed for edema.  17.  Flonase 50 mcg/inhalation nasal spray 2 sprays once a day.  18.  Mucinex 600 mg 1 tablet every 12 hours.  19.  Prednisone 50 mg decreased x 10 mg per day and then stop.  20.  Simvastatin 20 mg 1 tablet once a day.  21.  Albuterol/ipratropium 2.5 mg/0.5 mg in 3 mL inhaled solution inhale every 6 hours as needed for shortness of breath.  22.  Symbicort 160 mcg/4.5 mcg/inhalation 2 puffs inhaled twice a day.  23.  Levaquin 250 mg oral tablet 1 tablet every 24 hours to complete a 7 day course.  24.  Tussionex 8 mg/10 mg in 5 mL, 5 mL every 12 hours.  25.  Oxygen portable tank to be worn at all times for possible COPD.   CONDITION ON DISCHARGE: Stable.   DISPOSITION: Discharge to home with home health nursing and physical therapy.   DISCHARGE INSTRUCTIONS:  1.  Diet: Low-fat, low-cholesterol diet.  2.  Activity limitations as tolerated.  3.  Timeframe for followup: Please follow up in 1-2 days with your primary care provider, Dr. Jeanie Cooks.   TIME SPENT ON DISCHARGE: 40 minutes.      ____________________________ Ena Dawley. Clent Ridges, MD cpw:ts D: 11/14/2014 17:23:00 ET T: 11/14/2014 22:48:51 ET JOB#: [id number]  cc: Santina Evans P. Clent Ridges, MD, <Dictator> [name], MD Gale Journey MD ELECTRONICALLY SIGNED 11/18/2014 8:01

## 2015-01-25 NOTE — Consult Note (Signed)
General Aspect Primary Cardiologist: Dr. 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  ____________  PMH: ????? Arrhythmia?? ?? ?? ?? chronic A-Fib?? ????? Restless leg syndrome?? ?? ????? Pacemaker?? ?? ?? ?? implant for SSS?? ????? 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 ?? ?? ?? s/p mechanical valve replaced with bovine valve 2010?? ????? Leukocytosis?? ?? ????? Chronic kidney disease, stage I?? ?? ????? Sinoatrial node dysfunction?? ??  ????  Past Surgical History?? Procedure?? Laterality?? Date?? ????? Insert / replace / remove pacemaker?? ?? ?? ?? ?? implant for SSS?? ????? Mitral valve replacement?? ?? ?? ?? ?? s/p mechanical valve replaced with bovine valve 2010?? ????? Melanoma excision?? ?? ?? ?? ?? face?? ????? Bladder injection?? ?? ?? ????? Nasal sinus surgery??   _____________________   Present Illness 69 year old female with the above problem list who was recently admitted to [facility] for bronchitis that "took me for a loop" was readmitted to [facility] for rectus sheath hematoma along the right side in the setting of increased coughing. She has known chronic a-fib, on Coumadin that is managed by her PCP. Several years back INR was elevated to level of 4.0 in the setting of a fall that caused a large hematoma along her leg. Since that time she reports well controlled INR levels. I do not have INR reports at this time to review from PCP office. She reports the possibility of frequent falls, to her knees and  her daughter feels the same way. No recent falls to her head. No LOC. She does bump into things frequently. She reports that she is not steady on her feet, even with her walker.   She is feeling better from her episode of bronchitis outside of a lingering cough. She woke up on 2/28 with continued weakness (had been present since her prior admission) and soreness along her right anterior chest wall. She also noted a firm lesion along this area as well. She tried to pull herself up and out of bed, but could not. Her husband had to help get her out of bed. She denied any increased dyspnea, nausea, vomiting, diaphoresis, presyncope, or syncope. She was brought to [facility] for further evaluation. Upon her arrival to [facility] she was found to have a hematoma along the right side of her anterior chest wall. INR upon presentation was found to be 2.9-->2.9-->2.2. Surgery was consulted and felt no intervention was required at this time. Coumadin was held. Hgb has been monitored 11.2-->9.6. A-fib has been rate controlled. Pain from the hematoma is improving. Cardiology is consulted for management of Coumadin in the setting of hematomas.   Physical Exam:  GEN no acute distress   HEENT hearing intact to voice   NECK supple   RESP normal resp effort  clear BS   CARD Irregular rate and rhythm  Murmur  ecchymosis along the right anterior chest wall, TTP   Murmur Systolic   ABD denies tenderness  soft   EXTR multiple small bruises along the bilateral upper extremities   SKIN normal to palpation  NEURO cranial nerves intact   PSYCH alert   Review of Systems:  General: Fatigue  Weakness   Skin: No Complaints   ENT: No Complaints   Eyes: No Complaints   Neck: No Complaints   Respiratory: Short of breath   Cardiovascular: Chest pain or discomfort  reproducible to palpation   Gastrointestinal: No Complaints   Genitourinary: No Complaints   Vascular: No Complaints   Musculoskeletal: No Complaints    Neurologic: No Complaints   Hematologic: Ease of bruising   Endocrine: No Complaints   Psychiatric: No Complaints   Review of Systems: All other systems were reviewed and found to be negative   Medications/Allergies Reviewed Medications/Allergies reviewed   Family & Social History:  Family and Social History:  Family History Hypertension  Diabetes Mellitus  COPD   Social History negative ETOH, negative Illicit drugs   + Tobacco Prior (greater than 1 year)   Place of Living Home  lives with husband     GERD - Esophageal Reflux:    Hypercholesterolemia:    acid reflux:    Restless Leg Syndrome:    knee replacement times 3:    Mitral valve replacement:    Pacemaker:   Home Medications: Medication Instructions Status  Symbicort 160 mcg-4.5 mcg/inh inhalation aerosol 2 puff(s) inhaled 2 times a day Active  Mucinex 600 mg oral tablet, extended release 1 tab(s) orally every 12 hours Active  albuterol-ipratropium 2.5 mg-0.5 mg/3 mL inhalation solution 1  inhaled 6 times a day, As Needed Active  Aspir 81 oral enteric coated tablet 1  orally once a day  Active  cyclobenzaprine 10 mg tablet 1 tab(s) orally once a day (in the evening) Active  Os-Cal 500 + D 500 mg-400 intl units oral tablet, chewable 1 tab(s) orally 2 times a day Active  Colace 1 cap(s) orally 2 times a day Active  sodium bicarbonate 650 mg oral tablet 1 tab(s) orally 2 times a day Active  diltiazem 180 mg/24 hours oral tablet, extended release 1 tab(s) orally once a day (in the evening) Active  rOPINIRole 2 mg oral tablet 1 tab(s) orally once a day (at bedtime) Active  Vitamin D3 1000 intl units oral tablet 2 tab(s) orally once a month Active  diazepam 5 mg oral tablet 1 tab(s) orally once a day, As Needed Active  Voltaren Topical 1% topical gel Apply topically to affected area 2 times a day, As Needed Active  furosemide 20 mg oral tablet 1 tab(s) orally once a day, As Needed for edema Active  Flonase  50 mcg/inh nasal spray 2 spray(s) nasal once a day Active  acetaminophen 650 mg oral tablet 1 tab(s) orally every 4 hours, As Needed - for Pain Active  Mag-Ox 400 oral tablet 1 tab(s) orally 2 times a day Active  multivitamin 1 tab(s) orally once a day Active  omeprazole 20 mg oral enteric coated tablet 1 cap(s) orally once a day (in the morning) Active  warfarin 2 mg oral tablet 1 tab(s) orally 2 times a week on Monday and Thursday Active  warfarin 2 mg oral tablet 1 tab(s) orally 5 times a week (Sunday, Tuesday, Wednesday, Friday, Saturday) Active   Lab Results:  Routine Chem:  29-Feb-16 13:18   BUN 18  Creatinine (comp)  1.48  01-Mar-16 06:38   BUN 14  Creatinine (comp)  1.58  02-Mar-16 03:50   Glucose, Serum  121  BUN 18  Creatinine (comp)  1.53  Sodium, Serum 139  Potassium, Serum 4.0  Chloride, Serum 104  CO2, Serum 28  Calcium (Total), Serum 8.9  Anion Gap 7  Osmolality (calc) 281  eGFR (African American)  43  eGFR (Non-African American)  36 (eGFR values <3m/min/1.73 m2 may be an indication of chronic kidney disease (CKD). Calculated eGFR, using the MRDR Study equation, is useful in  patients with stable renal function. The eGFR calculation will not be reliable in acutely ill patients when serum creatinine is changing rapidly. It is not useful in patients on dialysis. The eGFR calculation may not be applicable to patients at the low and high extremes of body sizes, pregnant women, and vegetarians.)  Routine Coag:  29-Feb-16 13:18   INR 2.9 (INR reference interval applies to patients on anticoagulant therapy. A single INR therapeutic range for coumarins is not optimal for all indications; however, the suggested range for most indications is 2.0 - 3.0. Exceptions to the INR Reference Range may include: Prosthetic heart valves, acute myocardial infarction, prevention of myocardial infarction, and combinations of aspirin and anticoagulant. The need for a higher or  lower target INR must be assessed individually. Reference: The Pharmacology and Management of the Vitamin K  antagonists: the seventh ACCP Conference on Antithrombotic and Thrombolytic Therapy. CJASNK.5397Sept:126 (3suppl): [id number] A HCT value >55% may artifactually increase the PT.  In one study,  the increase was an average of 25%. Reference:  "Effect on Routine and Special Coagulation Testing Values of Citrate Anticoagulant Adjustment in Patients with High HCT Values." American Journal of Clinical Pathology 2006;126:400-405.)  01-Mar-16 06:38   INR 2.9 (INR reference interval applies to patients on anticoagulant therapy. A single INR therapeutic range for coumarins is not optimal for all indications; however, the suggested range for most indications is 2.0 - 3.0. Exceptions to the INR Reference Range may include: Prosthetic heart valves, acute myocardial infarction, prevention of myocardial infarction, and combinations of aspirin and anticoagulant. The need for a higher or lower target INR must be assessed individually. Reference: The Pharmacology and Management of the Vitamin K  antagonists: the seventh ACCP Conference on Antithrombotic and Thrombolytic Therapy. CQBHAL.9379Sept:126 (3suppl): 2N9146842 A HCT value >55% may artifactually increase the PT.  In one study,  the increase was an average of 25%. Reference:  "Effect on Routine and Special Coagulation Testing Values of Citrate Anticoagulant Adjustment in Patients with High HCT Values." American Journal of Clinical Pathology 2006;126:400-405.)  02-Mar-16 03:50   Prothrombin  24.6 (11.4-15.0 NOTE: New Reference Range  10/24/14)  INR 2.2 (INR reference interval applies to patients on anticoagulant therapy. A single INR therapeutic range for coumarins is not optimal for all indications; however, the suggested range for most indications is 2.0 - 3.0. Exceptions to the INR Reference Range may include: Prosthetic  heart valves, acute myocardial infarction, prevention of myocardial infarction, and combinations of aspirin and anticoagulant. The need for a higher or lower target INR must be assessed individually. Reference: The Pharmacology and Management of the Vitamin K  antagonists: the seventh ACCP Conference on Antithrombotic and Thrombolytic Therapy. CKWIOX.7353Sept:126 (3suppl): [id number] A HCT value >55% may artifactually increase the PT.  In one study,  the increase was an average of 25%. Reference:  "Effect on Routine and Special Coagulation Testing Values of Citrate Anticoagulant Adjustment in Patients with High HCT Values." American Journal of Clinical Pathology 2006;126:400-405.)  Routine Hem:  29-Feb-16 13:18   WBC (CBC)  13.3  RBC (CBC) 3.83  Hemoglobin (CBC)  11.2  Hematocrit (CBC)  34.0  Platelet Count (CBC) 318 (Result(s)  reported on 24 Nov 2014 at 01:38PM.)    18:50   Hemoglobin (CBC)  11.0 (Result(s) reported on 24 Nov 2014 at 07:13PM.)    21:50   Hemoglobin (CBC)  10.7 (Result(s) reported on 24 Nov 2014 at 10:00PM.)  01-Mar-16 06:38   WBC (CBC)  14.2  RBC (CBC)  3.58  Hemoglobin (CBC)  9.9  Hematocrit (CBC)  31.7  Platelet Count (CBC) 280  02-Mar-16 03:50   WBC (CBC)  14.0  RBC (CBC)  3.20  Hemoglobin (CBC)  9.6  Hematocrit (CBC)  28.6  Platelet Count (CBC) 262  MCV 89  MCH 30.1  MCHC 33.7  RDW  15.6  Neutrophil % 82.6  Lymphocyte % 8.7  Monocyte % 7.9  Eosinophil % 0.6  Basophil % 0.2  Neutrophil #  11.5  Lymphocyte # 1.2  Monocyte #  1.1  Eosinophil # 0.1  Basophil # 0.0 (Result(s) reported on 26 Nov 2014 at 06:49AM.)   EKG:  EKG Interp. by me   Interpretation no EKG performed   Radiology Results: CT:    29-Feb-16 14:18, CT Abdomen and Pelvis With Contrast  CT Abdomen and Pelvis With Contrast   REASON FOR EXAM:    (1) ruq palpable mass; (2) ruq palpable mass  COMMENTS:   May transport without cardiac monitor    PROCEDURE: CT  - CT ABDOMEN /  PELVIS  W  - Nov 24 2014  2:18PM     CLINICAL DATA:  Right upper quadrant pain palpable mass for 2 days,  pain with movement, on Coumadin, post appendectomy and  cholecystectomy.    EXAM:  CT ABDOMEN AND PELVIS WITH CONTRAST    TECHNIQUE:  Multidetector CT imaging of the abdomen and pelvis was performed  using the standard protocol following bolus administration of  intravenous contrast.    CONTRAST:  75 cc Omnipaque    COMPARISON:  None.    FINDINGS:  Sagittal images of the spine shows mild degenerative changes  thoracolumbar spine. The lung bases shows bilateral posterior  scarring. Partially visualized cardiac pacemaker leads. There is  status post mitral valve replacement.    Consistent with history there is a right rectus muscle hematoma  measures at least 6.8 x 8.7 cm. The hematoma is extending at least  13.7 cm cranial caudally as seenin sagittal image 63. There is mild  stranding of subcutaneous fat a in right anterior abdominal wall and  right flank wall post several laterally. There is no definite  evidence of acute contrast extravasation.    The patient is status postcholecystectomy. Enhanced liver is  unremarkable. The pancreas, spleen and adrenal glands are  unremarkable.    Enhanced kidneys are symmetrical in size. There is a lobulated renal  contour. There is a cyst in upper pole of the left kidney measures  1.2 cm. Exophytic cyst in midpole of the left kidney measures 1.3  cm. Second cyst in midpole of the left kidney measures 8 mm. Tiny  cyst in midpole posterior aspect of the right kidney measures 5 mm.    Atherosclerotic calcifications of abdominal aorta and iliac arteries  are noted. No small bowel obstruction. Tiny amount of anterior  perihepatic ascites. No free abdominal air.    Moderate colonic stool noted in right colon and transverse colon.  There is no pericecal inflammation. The patient is status post  appendectomy.    The uterus is  atrophic.    Delayed renal images shows bilateral renal symmetrical excretion.  The urinary bladder is unremarkable. No inguinal adenopathy. Few  diverticula are noted sigmoid colon. No evidence of acute  diverticulitis.   IMPRESSION:  1. There is a right rectus muscle hematoma in right upper and mid  anterior abdominal wall. The hematoma measures at least 6.8 x 8.7 cm  extending 13.7 cm cranial caudally. There is no evidence of active  IV contrast extravasation.  2. Mild stranding of subcutaneous fat in right anterior abdominal  wall and right flank wall laterally.  3. Status postcholecystectomy.  4. No hydronephrosis or hydroureter.  5. Bilateral renal cysts.  6. No small bowel obstruction.  7. Atherosclerotic calcifications of abdominal aorta and iliac  arteries. These results were called by telephone at the time of  interpretation on 11/24/2014 at 3:07 pm to Dr. Lavonia Drafts , who  verbally acknowledged these results.  Electronically Signed    By: Lahoma Crocker M.D.    On: 11/24/2014 15:08         Verified By: Ephraim Hamburger, M.D.,    Morphine: GI Distress, Hallucinations  Cephalexin: Rash  Tape: Rash  Vital Signs/Nurse's Notes: **Vital Signs.:   03-Mar-16 04:50  Vital Signs Type Routine  Temperature Temperature (F) 98  Celsius 36.6  Temperature Source oral  Pulse Pulse 72  Respirations Respirations 20  Systolic BP Systolic BP 841  Diastolic BP (mmHg) Diastolic BP (mmHg) 68  Mean BP 88  Pulse Ox % Pulse Ox % 94  Pulse Ox Activity Level  At rest  Oxygen Delivery Room Air/ 21 %    Impression 69 year old female with history of chronic a-fib on Coumadin, 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   1. Right-sided rectus sheath hematoma: -Likely occuring in the setting of her recent episode of bronchitis and increased coughing -Multiple upper extremity bruises occuring 2/2 self reported bumping into items in her house while ambulating -She reports some falls recently (she cannot tell me how recently though) down to her knees only -Would continue to hold Coumadin at this time in the short term given her hematoma until resolution  -Long term anticoagulation planning can be made through her primary cardiologist in outpatient follow up if she is doing well at that time -Could also consider transitioning her to our Coumadin Clinic, will have our office obtain her INR's from PCP   2. Unsteady gait: -Continue with walker -She will need inpatient rehab as she is quite weak  3. Chronic a-fib: -Coumadin on hold as above -Rate controlled -Continue Cardizem CD 180 mg daily  4. Recent bronchitis: -Likely underlying factor for #1 -Continued weakness, see #2 -Discussed incentive spirometry in the setting of chest wall pain  5. CKD stage III: -Stable  6. 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 Mild aortic stenosis: -Monitor as outpatient  8. COPD: -Continue inhalers per IM   Electronic Signatures for Addendum Section:  Kathlyn Sacramento (MD) (Signed Addendum 03-Mar-16 12:01)  The patient was seen and examined. Agree with the above. She presented with rectus sheath hematoma likely from excessive coughing. She is on Warfarin  for chronic A-fib. Hold anticoagulation for now. Can likely be resumed in 1-2 weeks once coughing resolves.   Electronic Signatures: Kathlyn Sacramento (MD)  (Signed 03-Mar-16 12:01)  Co-Signer: General Aspect/Present Illness, History and Physical Exam, Review of System, Family & Social History, Past  Medical History, Home Medications, Labs, EKG , Radiology, Allergies, Vital Signs/Nurse's Notes, Impression/Plan Rise Mu (PA-C)  (Signed 03-Mar-16 08:33)  Authored: General Aspect/Present Illness, History and Physical Exam, Review of System, Family & Social History, Past Medical History, Home Medications, Labs, EKG , Radiology, Allergies, Vital Signs/Nurse's Notes, Impression/Plan   Last Updated: 03-Mar-16 12:01 by Kathlyn Sacramento (MD)

## 2015-01-25 NOTE — Consult Note (Signed)
Brief Consult Note: Diagnosis: rectus sheath hematoma.   Patient was seen by consultant.   Consult note dictated.   Recommend further assessment or treatment.   Discussed with Attending MD.   Comments: spont RS hematoma, after bronchitis/coughing. Pt is anticoagulated for valve. No surgical indications but will follow.  Electronic Signatures: Lattie Hawooper, Jo Booze E (MD)  (Signed [phone]-Feb-16 16:56)  Authored: Brief Consult Note   Last Updated: 29-Feb-16 16:56 by Lattie Hawooper, Soriya Worster E (MD)

## 2015-01-25 NOTE — H&P (Signed)
PATIENT NAME:  Rhonda Griffith, [name] MR#:  [id number] DATE OF BIRTH:  06/21/1946  DATE OF ADMISSION:  11/08/2014  PRIMARY CARE PHYSICIAN: Jerold CoombeKathleen Shapley-Quinn, MD  CHIEF COMPLAINT: Shortness of breath and cough.   HISTORY OF PRESENT ILLNESS: This is a 69 year old female who presents to the hospital directly admitted [facility] due to shortness of breath and cough and congestion, progressively getting worse over the past few days. The patient says she developed symptoms of cough and congestion this past Friday and it has progressively gotten worse over the past few days. [facility] today and was noted to be wheezing and bronchospastic and hypoxic. She received some breathing treatments although, despite that, she was hypoxic on room air with oxygen saturation in the low 80s on ambulation. She was sent over to the hospital for direct admission.   The patient says that she has been trying to use an albuterol inhaler at home, and her husband's nebulizer, but her symptoms have not improved. The patient was noted to be in acute hypoxic respiratory failure secondary to acute bronchitis, and therefore is being admitted for further evaluation.   REVIEW OF SYSTEMS:  CONSTITUTIONAL: No documented fever. No weight gain or weight loss.  EYES: No blurred or double vision.  EARS, NOSE, AND THROAT: No tinnitus. No postnasal drip. No redness of the oropharynx.  RESPIRATORY: Positive cough. Positive wheeze. No hemoptysis. Positive dyspnea on exertion.  CARDIOVASCULAR: No chest pain, no orthopnea, no palpitations, no syncope.  GASTROINTESTINAL: No nausea, no vomiting, no diarrhea, no abdominal pain, no melena or hematochezia.  GENITOURINARY: No dysuria or hematuria.  ENDOCRINE: No polyuria or nocturia. No heat or cold intolerance.  HEMATOLOGIC: No anemia, no bruising, no bleeding.  INTEGUMENTARY: No rashes, no lesions.  MUSCULOSKELETAL: No arthritis, no  swelling, no gout.  NEUROLOGIC: No numbness, no tingling, no ataxia, no seizures or activity.  PSYCHIATRIC: No anxiety, no insomnia, no ADD. Positive depression.   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   ALLERGIES: CEPHALEXIN AND MORPHINE.   SOCIAL HISTORY: Quit smoking many, many years ago; does have a 4-5 pack-year smoking history. No alcohol abuse. No illicit drug abuse. Lives at home with her husband.   FAMILY HISTORY: The patient's mother died from complications of cervical cancer. Father died from old age.   CURRENT MEDICATIONS: As follows: Tylenol 650 mg 1 tablet q. 4 hours as needed, aspirin 81 mg daily, Colace 100 mg b.i.d., Flexeril 10 mg at bedtime, Valium 5 mg as needed, Cardizem CD 180 mg daily, Cymbalta 30 mg daily, Flonase 2 sprays to each nostril daily, Lasix 20 mg daily, magnesium oxide 400 mg b.i.d., multivitamin daily, omeprazole 20 mg daily, Os-Cal with Vitamin D 1 tablet b.i.d., Requip 2 mg at bedtime, simvastatin 20 mg daily, sodium bicarbonate 650 mg 1 tablet b.i.d., Vitamin D3 1000 international units 2 tablets monthly, Voltaren gel to be applied b.i.d. as needed, warfarin 2 mg daily.   PHYSICAL EXAMINATION: Presently is as follows:  VITAL SIGNS: Temperature 98.1, pulse 88, respirations 18, blood pressure 147/73, saturations 94% on room air.  GENERAL: She is a pleasant-appearing female, in no apparent distress.  HEAD, EYES, EARS, NOSE AND THROAT: Atraumatic, normocephalic. Extraocular muscles are intact. Pupils equal and reactive to light. Sclerae anicteric. No conjunctival injection. No pharyngeal erythema.  NECK: Supple.  There is no jugular venous distention. No bruits, no lymphadenopathy, no thyromegaly.  HEART: Regular rate and rhythm. No murmurs, no rubs, no clicks.  LUNGS: She has  some coarse rhonchi and wheezing diffusely. Negative use of accessory muscles. No dullness to percussion.  ABDOMEN: Soft, flat, nontender, nondistended. Has good bowel sounds. No hepatosplenomegaly appreciated.  EXTREMITIES: No evidence of cyanosis, clubbing, or peripheral edema. Has +2 pedal and radial pulses bilaterally.  NEUROLOGICAL: The patient is alert, awake, and oriented x 3, with no focal motor or sensory deficits appreciated bilaterally.  SKIN: Moist and warm with no rashes appreciated.  LYMPHATIC: There is no cervical or axillary lymphadenopathy.   LABORATORY DATA: Serum glucose of 130, BUN 16, creatinine 1.68, sodium 140, potassium 3.3, chloride 102, bicarbonate 27. The patient's white cell count was 7.9, hemoglobin 13.0, hematocrit 39.8, platelet count of 225,000.   The patient's Rapid Flu Test is negative. The patient also had a chest x-ray done at urgent care, which showed stable chronic and postoperative changes. No acute disease.   ASSESSMENT AND PLAN: This is a 69 year old female with a history of chronic atrial fibrillation, history of mitral valve replacement, history of GI bleed, GERD, depression, hyperlipidemia, restless leg syndrome, who presents to the hospital due to cough, congestion, shortness of breath, progressively getting worse and noted to be in acute respiratory failure due to acute bronchitis.  1.  Acute respiratory failure with hypoxia. The patient's oxygen saturation apparently dropped to the low 80s on ambulation at urgent care. This was likely secondary to acute bronchitis. I will treat the patient with IV steroids and around-the-clock nebulizer treatments, keep the patient on IV Levaquin and follow clinically. The patient likely needs to be assessed for home oxygen prior to discharge.  2.  Acute bronchitis, likely the cause of her cough and congestion, no worsening shortness of breath. We will treat the patient with IV Solu-Medrol for the bronchospasm and continue  around-the-clock nebulizer treatments, give her IV Levaquin and give her antitussives. Follow sputum cultures.  3.  Acute renal failure. This is likely secondary to dehydration and poor p.o. intake. I will hydrate the patient with IV fluids, follow BUN and creatinine, hold Lasix.  4.  Chronic atrial fibrillation. The patient is currently rate controlled. Continue Cardizem. Also continue Coumadin. Follow up PT/INR to the goal of 2-3.  5.  Gastroesophageal reflux disease. Continue Protonix.  6.  Depression. Continue Cymbalta.  7.  Restless leg syndrome. Continue Requip.   CODE STATUS: The patient is a Full Code.   TIME SPENT ON THE ADMISSION: 50 minutes.    ____________________________ Rolly Pancake. Cherlynn Kaiser, MD vjs:MT D: 11/08/2014 14:15:07 ET T: 11/08/2014 14:53:56 ET JOB#: [id number]  cc: Rolly Pancake. Cherlynn Kaiser, MD, <Dictator> Houston Siren MD ELECTRONICALLY SIGNED 11/26/2014 15:28

## 2015-01-25 NOTE — Discharge Summary (Signed)
PATIENT NAME:  Rhonda Griffith, Rhonda Griffith MR#:  [id number] DATE OF BIRTH:  01/04/1946  DATE OF ADMISSION:  11/24/2014 DATE OF DISCHARGE:  11/27/2014  ADMITTING DIAGNOSIS: Swelling and redness below her right breast.   DISCHARGE DIAGNOSES:  1. Swelling and redness below her right breast due to rectus sheath hematoma, spontaneous, exacerbated by Coumadin therapy.  2. Acute blood loss anemia as a result of a rectus sheath hematoma.  3. Chronic atrial fibrillation, history of porcine mitral valve replacement. Coumadin being held. Further discussions will need to be held with Dr. Mariah Griffith regarding chronic anticoagulation.  4. Restless leg syndrome.  5. History of atrial fibrillation.  6. Possible chronic obstructive pulmonary disease.  7. History of gastrointestinal bleed.  8. Gastroesophageal reflux disease.  9. Chronic renal insufficiency.   CONSULTANTS: (Dictation Anomaly >, PA with cardiology. Dr. Excell Griffith of surgery.  PERTINENT AND EVALUATIONS: Admitting glucose 136, BUN 18, creatinine 1.48, sodium 141, potassium 3.9, chloride 108, CO2 is 25, calcium is 9.3. Lipase 57, LFTs: Total protein 6.9, albumin 3.2, bilirubin total 0.3, alkaline phosphatase 105, AST 20, ALT 13. WBC 13.3, hemoglobin 11.2, platelet count was 318,000. Hemoglobin on discharge on 11/26/2014 was 9.6, INR 2.9 on admission. Urine cultures mixed bacterial organisms. Urinalysis leukocytes 1. WBC is 53. CT scan of the abdomen and pelvis showed a right rectus sheath muscle hematoma in the right upper and mid anterior abdominal wall, mild stranding of the subcutaneous fat in the right anterior abdominal wall and right flank wall laterally.   HOSPITAL COURSE: Please refer to H and P done by me on admission.   The patient is a 69 year old white female with history of porcelain mitral valve replacement, atrial fibrillation, who was on chronic Coumadin, developed a right-sided swelling and redness below her right breast. The patient came to the  ED and was noted to have a large rectus sheath hematoma. The patient was seen by surgery. They recommended conservative therapy. Her hemoglobin was monitored. She did have a drop in hemoglobin. Her Coumadin was discontinued. She was seen by Rhonda Griffith's PA, who stated that she will discussed with Dr. Mariah Griffith as an outpatient regarding further chronic anticoagulation. At this time, her hemoglobin is stable. She is very weak and will need further rehab therapy.   DISCHARGE MEDICATIONS: Aspirin 81 mg 1 tab p.o. daily, cyclobenzaprine 10 at bedtime, Os-Cal plus vitamin D 1 tablet p.o. b.i.d., Colace 1 tab p.o. b.i.d., sodium bicarbonate 651 tabs p.o. b.i.d., diltiazem 180 daily, ropinirole 2 mg at bedtime, vitamin D3 1000, 2 tabs daily, diazepam 5 mg daily as needed, Voltaren topically applied to affected area b.i.d. as needed, Lasix 20 two daily, Flonase 2 sprays daily, Mucinex 600 one tabs p.o. every 12 hours, albuterol ipratropium inhalation every 6 hours p.r.n., Symbicort 2 puffs b.i.d., Tylenol 650 every 4 hours p.r.n., Mag-Ox at 400 mg 1 tab p.o. b.i.d., multivitamin 1 tab p.o. daily, omeprazole 20 daily, acetaminophen/hydrocodone 325/5 mg 1 tab p.o. every 4 hours p.r.n. for moderate pain, lactulose 30 mL b.i.d.,   DISCHARGE DIET: Low-sodium, low-fat, low-cholesterol.   ACTIVITY: As tolerated. PT evaluation and treatment.   FOLLOWUP: With primary MD in 1 to 2 weeks. Followup Dr. Mariah Griffith in 1 to 2 weeks. Check CBC this Monday.    TIME SPENT: 35 minutes on this patient.  ____________________________ Rhonda ScottsShreyang H. Allena KatzPatel, MD shp:ap D: 11/27/2014 11:02:00 ET T: 11/27/2014 11:47:11 ET JOB#: [id number]  cc: Rhonda Tarbet H. Allena KatzPatel, MD, <Dictator> Rhonda CarwinSHREYANG H Anjanae Woehrle MD ELECTRONICALLY SIGNED 11/28/2014 15:24

## 2015-01-25 NOTE — H&P (Signed)
PATIENT NAME:  Rhonda Griffith, Rhonda Griffith MR#:  [id number] DATE OF BIRTH:  [birth year]  DATE OF ADMISSION:  11/24/2014  PRIMARY CARE PROVIDER:  Dr. Jeanie Cooks   REASON FOR ADMISSION:  Swelling and redness below her right breast.   HISTORY OF PRESENT ILLNESS: The patient is a 69 year old white female who was actually hospitalized with hypoxia and was discharged on 11/11/2014 with presumed possible underlying COPD and acute bronchitis. The patient was discharged home on Levaquin.  She reports that starting Saturday she started noticing swelling below her breast, and pain, but she did not want to come to the hospital. She finally decided to come to the hospital today.  She had a CT scan which basically showed a right rectus muscle hematoma in the right upper and mid anterior abdominal wall. The ED physician spoke to Dr. Excell Seltzer who stated that there is no surgical need, recommended monitoring her hemoglobin at holding her Coumadin. The patient otherwise states that her breathing is much improved. She was discharged on oxygen, but is using it only as needed. She otherwise denies any fevers, chills.   PAST MEDICAL HISTORY:  1.  History of chronic atrial fibrillation. 2.  History of mitral valve replacement with bovine valve.  3.  History of GI bleed.  4.  GERD.   5.  Depression.  6.  Restless leg syndrome.  7.  Hyperlipidemia.  8.  Chronic renal insufficiency.  ALLERGIES: (DICTATION ANOMALY)  AND MORPHINE.   SOCIAL HISTORY: Used to smoke many years ago, does have 4 to 5 pack-year history of smoking but quit, no alcohol abuse, no drug use.   FAMILY HISTORY: The patient's mother died from complications of cervical cancer. Father died from old age.   MEDICATIONS AT HOME: She is on warfarin 2 mg 1 tab p.o. b.i.d. on Monday and Thursday, warfarin 2 mg 1 tablet 5 times a week on Sunday, Tuesday, Wednesday, Friday, Saturday, Voltaren topically affected area 2 times a day, vitamin D3 at 1000 international  units 2 tablets once a month, Symbicort 2 puffs b.i.d., sodium bicarbonate 650 at 1 tabs p.o. b.i.d., Ropinirole 2 mg 1 tab p.o. at bedtime, Os-Cal 1 tablet p.o. b.i.d., omeprazole 20 daily, multivitamin daily, Mucinex 600 at 1 tabs p.o., magnesium oxide 400 mg 1 tab p.o. b.i.d., Lasix 20 at 1 tabs p.o. daily, Flonase 2 sprays daily, diltiazem 180 daily, and diazepam 5 mg daily as needed, cyclobenzaprine 10 mg daily, Colace 1 tab p.o. b.i.d., aspirin 81 mg 1 tab p.o. daily. I will give ipratropium 6 times a day as needed, Tylenol 650 every 4 p.r.n.   REVIEW OF SYSTEMS:  CONSTITUTIONAL: No fevers. No weight loss. No weight gain.  EYES: No blurred or double vision.  EAR, NOSE OR THROAT:  No tinnitus. No postnasal drip. No redness of the oropharynx.  RESPIRATORY: Denies any cough, wheezing. No hemoptysis. Has chronic dyspnea on exertion.  CARDIOVASCULAR: No chest pain, orthopnea, edema, or syncope.  GASTROINTESTINAL: No nausea, vomiting or diarrhea. No abdominal pain. No melena. No hematochezia.  GENITOURINARY: Denies any dysuria, hematuria.  ENDOCRINE: Denies any polyuria or nocturia. No heat or cold intolerance.  HEMATOLOGIC: No anemia, easy bruisability or bleeding.  SKIN: No rashes, no lesions.   MUSCULOSKELETAL: No arthritis, No swelling or gout.  NEUROLOGIC: No numbness, tingling, ataxia or seizure activity.  PSYCHIATRIC: No anxiety, insomnia, or ADD. Positive for depression.   PHYSICAL EXAMINATION:  VITAL SIGNS: Temperature 97.5, pulse 82, respirations 19, blood pressure 162/72, O2 of 97%.  GENERAL: The  patient is a well-developed, well-nourished female in no acute distress.  HEENT: Head atraumatic, normocephalic. Pupils equally round, reactive to light and accommodation. There is no conjunctival pallor. No sclerae icterus. Nasal exam shows no drainage or ulceration.  OROPHARYNX: Clear without any exudate.  NECK: Supple without any thyromegaly.  CARDIOVASCULAR: Regular rate and rhythm. No  murmurs, rubs, clicks, or gallops.  LUNGS: Clear to auscultation bilaterally without any rales, rhonchi, wheezing.  ABDOMEN:  Soft.  She has a right upper abdominal swelling. No significant warmth and some tenderness due to the hematoma. Positive bowel sounds x 4. No hepatosplenomegaly.  EXTREMITIES: No clubbing, cyanosis, or edema.  SKIN: No rash.  LYMPH NODES: Nonpalpable.  VASCULAR: Good DP/PT pulses.  PSYCHIATRIC: Not anxious or depressed.   LABORATORY EVALUATIONS:  CT scan of the abdomen and pelvis shows a right rectus muscle hematoma in the right upper and mid anterior abdominal wall. Mild stranding subcutaneous fat right anterior abdominal wall, right flank wall laterally.  WBC 13.3, hemoglobin 11.2, platelet count 318,000.  Lipase 57, glucose 136, BUN 18, creatinine 1.47, sodium 141, potassium 3.9, chloride 105, CO2 of 25, albumin 3.2.    ASSESSMENT AND PLAN: The patient is a 69 year old white female with history of atrial fibrillation, mitral valve that is actually porcine, is on chronic anticoagulant who presents with right lower chest abdominal pain, noted to have a rectus sheath hematoma.  1.  Rectus sheath hematoma. At this time, we will hold her Coumadin.  Monitor hemoglobin and hematocrit, transfuse as needed.  I have consented the patient for transfusion if needed.   Surgery evaluation Dr. Excell Seltzerooper, has told the Emergency Department physician that there is no surgical intervention needed.  2.  Atrial fibrillation. Will hold her Coumadin.  3.  Restless leg syndrome. Continue Flexeril.  Will continue Valium and Requip as taken at home.  4.  History of atrial fibrillation. Continue diltiazem as taken at home. 5.  Possible chronic obstructive pulmonary disease. Continue Symbicort as taken at home.  6.  Miscellaneous. We will use sequential compression devices for deep vein thrombosis prophylaxis.    TIME SPENT: 45 minutes on this patient.      ____________________________ Lacie ScottsShreyang  H. Allena KatzPatel, MD shp:DT D: 11/24/2014 16:26:42 ET T: 11/24/2014 17:28:26 ET JOB#: [id number]  cc: Chae Shuster H. Allena KatzPatel, MD, <Dictator> Charise CarwinSHREYANG H Maximo Spratling MD ELECTRONICALLY SIGNED 11/28/2014 15:23

## 2015-02-17 ENCOUNTER — Telehealth: Payer: Self-pay

## 2015-02-17 NOTE — Telephone Encounter (Signed)
Received anticoag/antiplatelet clearance from from [facility] for pt to have lumbar epidural steroid injection requesting pt hold Eliquis for 3 days prior to procedure.  Per Eula Listenyan Dunn, PA, he is agreeable and pt is cleared to proceed.  Faxed to Tabitha's attention at [phone].

## 2015-03-11 ENCOUNTER — Encounter: Payer: Self-pay | Admitting: Cardiovascular Disease

## 2015-03-11 ENCOUNTER — Ambulatory Visit (INDEPENDENT_AMBULATORY_CARE_PROVIDER_SITE_OTHER): Payer: Medicare Other | Admitting: Cardiovascular Disease

## 2015-03-11 VITALS — BP 158/64 | HR 68 | Ht 69.0 in | Wt 199.2 lb

## 2015-03-11 DIAGNOSIS — Z950 Presence of cardiac pacemaker: Secondary | ICD-10-CM

## 2015-03-11 DIAGNOSIS — I5032 Chronic diastolic (congestive) heart failure: Secondary | ICD-10-CM | POA: Diagnosis not present

## 2015-03-11 DIAGNOSIS — I10 Essential (primary) hypertension: Secondary | ICD-10-CM

## 2015-03-11 DIAGNOSIS — I4891 Unspecified atrial fibrillation: Secondary | ICD-10-CM

## 2015-03-11 NOTE — Assessment & Plan Note (Signed)
Currently not taking Lasix. Recommended that she take Lasix every other day until her trace leg edema improves, then Lasix as needed

## 2015-03-11 NOTE — Patient Instructions (Signed)
You are doing well. No medication changes were made.  Please call us if you have new issues that need to be addressed before your next appt.  Your physician wants you to follow-up in: 6 months.  You will receive a reminder letter in the mail two months in advance. If you don't receive a letter, please call our office to schedule the follow-up appointment.   

## 2015-03-11 NOTE — Assessment & Plan Note (Signed)
Chronic atrial fibrillation, tolerating eliquis 5 mg twice a day No recent falls. Rare episodes of hematoma, most recently February 2016.  Echocardiogram January 2016 showing mild to moderate pulmonary hypertension, moderately dilated left atrium

## 2015-03-11 NOTE — Assessment & Plan Note (Signed)
Blood pressure is well controlled on today's visit. No changes made to the medications. 

## 2015-03-11 NOTE — Progress Notes (Signed)
Patient ID: Rhonda Griffith, female    DOB: Aug 20, 1946, 69 y.o.   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 At that time  her Coumadin was briefly held. Her INR at that time was more than 4. Notes indicate a history of sick sinus syndrome for which a pacemaker was placed. History of bilateral knee replacements She presents today for follow-up of her atrial fibrillation and shortness of breath. History of sleep apnea, not on CPAP  Recent loss of her husband from acute illness. He was on hospice at the time She is very tearful on today's visit. Otherwise she has been doing well. Daughter here for support She's not been taking Lasix on a regular basis. She does have very mild ankle edema.  EKG on today's visit shows paced rhythm, rate 68 bpm  Other past medical history Previously had shortness of breath, improved with Lasix.  Previously at home, She spends significant time playing on the computer  She does take a pain pill occasionally to sleep better.  Valium for anxiety. severe bilateral knee pain.  Statin was held in the past for leg pain, Zocor.  Previous spinal tap at [facility] for high pressure in her ventricle. She reports no significant improvement after this procedure (drain?).  Placement of a bladder stimulator implant for overactive bladder was denied despite cardiac clearance . Total cholesterol 184, LDL 98, HDL 63    Allergies  Allergen Reactions  . Morphine   . Tape     Allergic to plastic/latex tape. Only use paper tape on patient.  . Cephalexin Rash    Outpatient Encounter Prescriptions as of 03/11/2015  Medication Sig  . albuterol (PROVENTIL HFA;VENTOLIN HFA) 108 (90  BASE) MCG/ACT inhaler Inhale 2 puffs into the lungs every 6 (six) hours as needed for wheezing.  [name] apixaban (ELIQUIS) 5 MG TABS tablet Take 1 tablet (5 mg total) by mouth 2 (two) times daily.  . calcium-vitamin D (OSCAL-500) 500-400 MG-UNIT per tablet Take 1 tablet by mouth 2 (two) times daily.  . Cholecalciferol (VITAMIN D3) 1000 UNITS CAPS Take 2,000 Units by mouth every 30 (thirty) days.   . cyclobenzaprine (FLEXERIL) 10 MG tablet Take 10 mg by mouth at bedtime.   [name] DIAZEPAM PO Take 5 mg by mouth as needed.   . diclofenac sodium (VOLTAREN) 1 % GEL Apply 1 application topically 2 (two) times daily.    [name] DILT-XR 180 MG 24 hr capsule TAKE ONE CAPSULE BY MOUTH EVERY DAY  . Docusate Calcium (STOOL SOFTENER PO) Take 1 tablet by mouth 2 (two) times daily.  . fluticasone (FLONASE) 50 MCG/ACT nasal spray Place into both nostrils daily.  . furosemide (LASIX) 20 MG tablet Take 20 mg by mouth daily as needed.   [name] guaiFENesin (MUCINEX) 600 MG 12 hr tablet Take 600 mg by mouth as needed.   . magnesium oxide (MAG-OX) 400 MG tablet Take 400 mg by mouth 2 (two) times daily.   . Multiple Vitamin (MULTIVITAMIN) tablet Take 1 tablet by mouth daily.  [name] omeprazole (PRILOSEC) 20 MG capsule Take 20 mg by mouth daily.    [name] oxyCODONE-acetaminophen (PERCOCET) 5-325 MG per tablet Take 1 tablet by mouth every 4 (four) hours as  needed.    . potassium chloride (K-DUR) 10 MEQ tablet Take 1 tablet (10 mEq total) by mouth daily. Take with lasix  . rOPINIRole (REQUIP XL) 2 MG 24 hr tablet Take 2 mg by mouth at bedtime.    . [DISCONTINUED] budesonide-formoterol (SYMBICORT) 160-4.5 MCG/ACT inhaler Inhale 2 puffs into the lungs 2 (two) times daily.  . [DISCONTINUED] DULoxetine (CYMBALTA) 60 MG capsule Take 60 mg by mouth daily.  . [DISCONTINUED] sodium bicarbonate 650 MG tablet Take 650 mg by mouth 2 (two) times daily.    No facility-administered encounter medications on file as of 03/11/2015.    Past Medical History   Diagnosis Date  . Atrial fibrillation, permanent   . Restless leg syndrome   . Pacemaker -[employer]     implant for SSS  . 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 Leukocytosis   . Chronic kidney disease, stage I     Past Surgical History  Procedure Laterality Date  . Insert / replace / remove pacemaker      implant for SSS  . Mitral valve replacement      s/p mechanical valve replaced with bovine valve 2010  . Melanoma excision      face  . Bladder injection    . Nasal sinus surgery    . Botox bladder      Social History  reports that she quit smoking about 44 years ago. Her smoking use included Cigarettes. She has a 5 pack-year smoking history. She does not have any smokeless tobacco history on file. She reports that she does not drink alcohol or use illicit drugs.  Family History Family history is unknown by patient.   Review of Systems  Constitutional: Negative.   Respiratory: Negative.   Cardiovascular: Negative.   Gastrointestinal: Negative.   Musculoskeletal: Positive for joint swelling and gait problem.  Neurological:       Problems with motor coordination and gait.  Hematological: Negative.   Psychiatric/Behavioral: Negative.   All other systems reviewed and are negative.   BP 158/64 mmHg  Pulse 68  Ht  (1.753 m)  Wt 199 lb 4 oz (90.379 kg)  BMI 29.41 kg/m2  Physical Exam  Constitutional: She is oriented to person, place, and time. She appears well-developed and well-nourished.  Leg weakness  HENT:  Head: Normocephalic.  Nose: Nose normal.  Mouth/Throat: Oropharynx is clear and moist.  Eyes: Conjunctivae are normal. Pupils are equal, round, and reactive to light.  Neck: Normal range of motion. Neck supple. No JVD present.  Cardiovascular: Normal rate, regular rhythm, S1 normal, S2 normal, normal heart sounds and intact distal pulses.  Exam reveals no gallop and no friction  rub.   No murmur heard. Pulmonary/Chest: Effort normal and breath sounds normal. No respiratory distress. She has no wheezes. She has no rales. She exhibits no tenderness.  Abdominal: Soft. Bowel sounds are normal. She exhibits no distension. There is no tenderness.  Musculoskeletal: Normal range of motion. She exhibits no edema or tenderness.  Lymphadenopathy:    She has no cervical adenopathy.  Neurological: She is alert and oriented to person, place, and time. Coordination normal.  Skin: Skin is warm and dry. No rash noted. No erythema.  Psychiatric: She has a normal mood and affect. Her behavior is normal. Judgment and thought content normal.    Assessment and Plan  Nursing note and vitals reviewed.

## 2015-03-11 NOTE — Assessment & Plan Note (Signed)
Monitored by Dr. Klein 

## 2015-03-17 ENCOUNTER — Encounter: Payer: Medicare Other | Admitting: Internal Medicine

## 2015-03-24 ENCOUNTER — Telehealth: Payer: Self-pay

## 2015-03-24 NOTE — Telephone Encounter (Signed)
Received cardiac clearance request from [facility] for pt to proceed w/ lumber epidural steroid injection, not yet scheduled. Per Eula Listenyan Dunn, PA, hold Eliquis x 3 days and restart per MD. Faxed to [phone].

## 2015-04-16 ENCOUNTER — Encounter: Payer: Self-pay | Admitting: Internal Medicine

## 2015-04-21 ENCOUNTER — Encounter: Payer: Medicare Other | Admitting: Internal Medicine

## 2015-04-24 ENCOUNTER — Telehealth: Payer: Self-pay

## 2015-04-24 ENCOUNTER — Other Ambulatory Visit: Payer: Self-pay

## 2015-04-24 DIAGNOSIS — R209 Unspecified disturbances of skin sensation: Secondary | ICD-10-CM

## 2015-04-24 MED ORDER — APIXABAN 5 MG PO TABS
5.0000 mg | ORAL_TABLET | Freq: Two times a day (BID) | ORAL | Status: DC
Start: 2015-04-24 — End: 2015-06-25

## 2015-04-24 NOTE — Telephone Encounter (Signed)
Refill sent for Eliquis 5 mg take one tablet twice a day.

## 2015-04-24 NOTE — Telephone Encounter (Signed)
Would consider ankle-brachial indexes throughout her office to make sure there is not a circulation problem If this is normal, possible neuropathy

## 2015-04-24 NOTE — Telephone Encounter (Signed)
Pt called, states every night when she goes to bed, from her shin, down to her feet, gets "ice cold" She is not sure what she should do. States this is scaring her. States it is mainly her left leg. Please call.

## 2015-04-27 NOTE — Telephone Encounter (Signed)
Pt scheduled for 05/11/2015 1:00

## 2015-04-27 NOTE — Telephone Encounter (Signed)
Order is placed. Thank you.

## 2015-04-27 NOTE — Addendum Note (Signed)
Addended by: Rhea Belton R on: 04/27/2015 10:08 AM   Modules accepted: Orders

## 2015-04-27 NOTE — Telephone Encounter (Signed)
Usually with an ABI, unless it is post stent, and LE is ordered also. I do not see where she has ever had just and ABI before. Please advise if this needs an LE also.

## 2015-04-27 NOTE — Telephone Encounter (Signed)
Can we set her up for this? Thank you!

## 2015-04-28 DIAGNOSIS — Z8673 Personal history of transient ischemic attack (TIA), and cerebral infarction without residual deficits: Secondary | ICD-10-CM | POA: Insufficient documentation

## 2015-04-30 DIAGNOSIS — E7800 Pure hypercholesterolemia, unspecified: Secondary | ICD-10-CM | POA: Insufficient documentation

## 2015-05-01 ENCOUNTER — Telehealth: Payer: Self-pay | Admitting: *Deleted

## 2015-05-01 NOTE — Telephone Encounter (Signed)
Pt daughter calling stating that pt had a stroke on Monday and had a medication change She went from Eliquis to Xarelto. Just wanting to let us know  She will be starting that today.  Please call if we have any concerns:

## 2015-05-08 ENCOUNTER — Other Ambulatory Visit: Payer: Self-pay | Admitting: Cardiovascular Disease

## 2015-05-08 DIAGNOSIS — R209 Unspecified disturbances of skin sensation: Secondary | ICD-10-CM

## 2015-05-08 DIAGNOSIS — R270 Ataxia, unspecified: Secondary | ICD-10-CM

## 2015-05-08 DIAGNOSIS — M25561 Pain in right knee: Secondary | ICD-10-CM

## 2015-05-08 DIAGNOSIS — M25562 Pain in left knee: Secondary | ICD-10-CM

## 2015-05-12 ENCOUNTER — Other Ambulatory Visit: Payer: Self-pay | Admitting: Cardiovascular Disease

## 2015-05-12 ENCOUNTER — Telehealth: Payer: Self-pay | Admitting: Cardiovascular Disease

## 2015-05-12 ENCOUNTER — Ambulatory Visit (INDEPENDENT_AMBULATORY_CARE_PROVIDER_SITE_OTHER): Payer: Medicare Other

## 2015-05-12 DIAGNOSIS — M25561 Pain in right knee: Secondary | ICD-10-CM

## 2015-05-12 DIAGNOSIS — I739 Peripheral vascular disease, unspecified: Secondary | ICD-10-CM

## 2015-05-12 DIAGNOSIS — R270 Ataxia, unspecified: Secondary | ICD-10-CM

## 2015-05-12 DIAGNOSIS — R209 Unspecified disturbances of skin sensation: Secondary | ICD-10-CM

## 2015-05-12 DIAGNOSIS — M25562 Pain in left knee: Secondary | ICD-10-CM

## 2015-05-12 NOTE — Telephone Encounter (Signed)
The patient was in the office today for a PV ultrasound. I spoke with her daughter- the patient had a stroke 2 weeks ago last night.  She was switched from Eliquis to Xarelto recently. The patient typically gets a botox injection to her bladder every 4-6 months (over active bladder) and she is due to have this on Friday 05/15/15. Per the patient's daughter, they will typically have the patient hold her blood thinner for 3 days prior to injection. Reviewed with Dr. Kirke Corin- the patient should not hold anticoagulation for 3 months post stroke. The patient's daughter is aware of this and verbalizes understanding.

## 2015-05-12 NOTE — Telephone Encounter (Signed)
Forward to Ryan Dunn, PA-C 

## 2015-05-12 NOTE — Telephone Encounter (Signed)
Pt c/o medication issue:  1. Name of Medication: Xarelto 20 mg po daily   2. How are you currently taking this medication (dosage and times per day)? 20 mg po daily   3. Are you having a reaction (difficulty breathing--STAT)?  No   4. What is your medication issue? Patient is having botox injection to bladder on 08/19 Friday and wants to know if she should hold xarelto and when.  Patient just had a stroke 2 weeks ago and was treated at [facility].  Patient daughter says they are agreeable to postponing injections if needed .  Please call patient or daughter.

## 2015-05-13 ENCOUNTER — Other Ambulatory Visit: Payer: Self-pay | Admitting: Cardiovascular Disease

## 2015-05-26 ENCOUNTER — Encounter: Payer: Self-pay | Admitting: Emergency Medicine

## 2015-05-26 ENCOUNTER — Encounter: Payer: Medicare Other | Admitting: Internal Medicine

## 2015-05-26 ENCOUNTER — Telehealth: Payer: Self-pay

## 2015-05-26 ENCOUNTER — Emergency Department
Admission: EM | Admit: 2015-05-26 | Discharge: 2015-05-26 | Disposition: A | Discharge status: Home / Self Care | Payer: Medicare Other | Attending: Emergency Medicine | Admitting: Emergency Medicine

## 2015-05-26 ENCOUNTER — Telehealth: Payer: Self-pay | Admitting: *Deleted

## 2015-05-26 DIAGNOSIS — Z7951 Long term (current) use of inhaled steroids: Secondary | ICD-10-CM | POA: Diagnosis not present

## 2015-05-26 DIAGNOSIS — N3090 Cystitis, unspecified without hematuria: Secondary | ICD-10-CM | POA: Insufficient documentation

## 2015-05-26 DIAGNOSIS — N939 Abnormal uterine and vaginal bleeding, unspecified: Secondary | ICD-10-CM | POA: Diagnosis present

## 2015-05-26 DIAGNOSIS — Z79899 Other long term (current) drug therapy: Secondary | ICD-10-CM | POA: Insufficient documentation

## 2015-05-26 DIAGNOSIS — Z87891 Personal history of nicotine dependence: Secondary | ICD-10-CM | POA: Insufficient documentation

## 2015-05-26 DIAGNOSIS — N181 Chronic kidney disease, stage 1: Secondary | ICD-10-CM | POA: Insufficient documentation

## 2015-05-26 DIAGNOSIS — Z7901 Long term (current) use of anticoagulants: Secondary | ICD-10-CM | POA: Insufficient documentation

## 2015-05-26 DIAGNOSIS — I129 Hypertensive chronic kidney disease with stage 1 through stage 4 chronic kidney disease, or unspecified chronic kidney disease: Secondary | ICD-10-CM | POA: Diagnosis not present

## 2015-05-26 DIAGNOSIS — Z91040 Latex allergy status: Secondary | ICD-10-CM | POA: Insufficient documentation

## 2015-05-26 HISTORY — DX: Cerebral infarction, unspecified: I63.9

## 2015-05-26 LAB — BASIC METABOLIC PANEL
ANION GAP: 8 (ref 5–15)
BUN: 15 mg/dL (ref 6–20)
CALCIUM: 10.1 mg/dL (ref 8.9–10.3)
CO2: 29 mmol/L (ref 22–32)
Chloride: 104 mmol/L (ref 101–111)
Creatinine, Ser: 1.27 mg/dL — ABNORMAL HIGH (ref 0.44–1.00)
GFR, EST AFRICAN AMERICAN: 48 mL/min — AB (ref >60)
GFR, EST NON AFRICAN AMERICAN: 41 mL/min — AB (ref >60)
Glucose, Bld: 175 mg/dL — ABNORMAL HIGH (ref 65–99)
Potassium: 3.8 mmol/L (ref 3.5–5.1)
SODIUM: 141 mmol/L (ref 135–145)

## 2015-05-26 LAB — URINALYSIS COMPLETE WITH MICROSCOPIC (ARMC ONLY)
BILIRUBIN URINE: NEGATIVE
Glucose, UA: NEGATIVE mg/dL
Hgb urine dipstick: NEGATIVE
KETONES UR: NEGATIVE mg/dL
NITRITE: NEGATIVE
PH: 6 (ref 5.0–8.0)
PROTEIN: NEGATIVE mg/dL
SPECIFIC GRAVITY, URINE: 1.012 (ref 1.005–1.030)
Squamous Epithelial / LPF: NONE SEEN

## 2015-05-26 LAB — CBC WITH DIFFERENTIAL/PLATELET
BASOS ABS: 0.1 10*3/uL (ref 0–0.1)
BASOS PCT: 1 %
EOS ABS: 0.3 10*3/uL (ref 0–0.7)
Eosinophils Relative: 3 %
HEMATOCRIT: 37.3 % (ref 35.0–47.0)
HEMOGLOBIN: 12 g/dL (ref 12.0–16.0)
Lymphocytes Relative: 15 %
Lymphs Abs: 1.5 10*3/uL (ref 1.0–3.6)
MCH: 28.5 pg (ref 26.0–34.0)
MCHC: 32.3 g/dL (ref 32.0–36.0)
MCV: 88.2 fL (ref 80.0–100.0)
MONOS PCT: 7 %
Monocytes Absolute: 0.7 10*3/uL (ref 0.2–0.9)
NEUTROS ABS: 7.6 10*3/uL — AB (ref 1.4–6.5)
NEUTROS PCT: 74 %
Platelets: 264 10*3/uL (ref 150–440)
RBC: 4.22 MIL/uL (ref 3.80–5.20)
RDW: 16.4 % — ABNORMAL HIGH (ref 11.5–14.5)
WBC: 10.3 10*3/uL (ref 3.6–11.0)

## 2015-05-26 LAB — PROTIME-INR
INR: 1.39
PROTHROMBIN TIME: 17.3 s — AB (ref 11.4–15.0)

## 2015-05-26 LAB — APTT: aPTT: 36 seconds (ref 24–36)

## 2015-05-26 MED ORDER — CEFUROXIME AXETIL 250 MG PO TABS: 250.0000 mg | ORAL_TABLET | Freq: Two times a day (BID) | ORAL | Status: AC

## 2015-05-26 NOTE — Telephone Encounter (Signed)
Spoke w/ pt's daughter.   She reports that pt wears incontinence pads and when she woke this am, it was saturated w/ blood.  She states that she called pt's PCP who recommend that she take pt to nearest ED.  Advised her that I agree w/ this and for her to seek immediate attention.  She is unsure whether to take pt to [facility] or to [facility], as pt was recently at [facility] for stroke.  Advised her that it is her preference.  She verbalizes understanding and will proceed to ED of her choice.

## 2015-05-26 NOTE — ED Notes (Signed)
AAOx3.  Skin warm and dry.  NAD.  D/C home 

## 2015-05-26 NOTE — Discharge Instructions (Signed)
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 We reviewed the culture results from before and found that Ceftin is the only medication that will work for this. Take another course of this medicine as prescribed for the urinary tract infection. Follow-up with gynecology regarding your vaginal bleeding. You can follow up with [facility] or [facility] clinic.  Urinary Tract Infection Urinary tract infections (UTIs) can develop anywhere along your urinary tract. Your urinary tract is your body's drainage system for removing wastes and extra water. Your urinary tract includes two kidneys, two ureters, a bladder, and a urethra. Your kidneys are a pair of bean-shaped organs. Each kidney is about the size of your fist. They are located below your ribs, one on each side of your spine. CAUSES Infections are caused by microbes, which are microscopic organisms, including fungi, viruses, and bacteria. These organisms are so small that they can only be seen through a microscope. Bacteria are the microbes that most commonly cause UTIs. SYMPTOMS  Symptoms of UTIs may vary by age and gender of the patient and by the location of the infection. Symptoms in young women typically include a frequent and intense urge to urinate and a painful, burning feeling in the bladder or urethra during urination. Older women and men are more likely to be tired, shaky, and weak and have muscle aches and abdominal pain. A fever may mean the infection is in your kidneys. Other symptoms of a kidney infection include pain in your back or sides below the ribs, nausea, and vomiting. DIAGNOSIS To diagnose a UTI, your caregiver will ask you about your symptoms. Your caregiver also will ask to provide a urine sample. The urine sample will be tested for bacteria and white blood cells. White blood cells are made by your body to help fight infection. TREATMENT  Typically, UTIs can be treated with medication. Because most UTIs are  caused by a bacterial infection, they usually can be treated with the use of antibiotics. The choice of antibiotic and length of treatment depend on your symptoms and the type of bacteria causing your infection. HOME CARE INSTRUCTIONS  If you were prescribed antibiotics, take them exactly as your caregiver instructs you. Finish the medication even if you feel better after you have only taken some of the medication.  Drink enough water and fluids to keep your urine clear or pale yellow.  Avoid caffeine, tea, and carbonated beverages. They tend to irritate your bladder.  Empty your bladder often. Avoid holding urine for long periods of time.  Empty your bladder before and after sexual intercourse.  After a bowel movement, women should cleanse from front to back. Use each tissue only once. SEEK MEDICAL CARE IF:   You have back pain.  You develop a fever.  Your symptoms do not begin to resolve within 3 days. SEEK IMMEDIATE MEDICAL CARE IF:   You have severe back pain or lower abdominal pain.  You develop chills.  You have nausea or vomiting.  You have continued burning or discomfort with urination. MAKE SURE YOU:   Understand these instructions.  Will watch your condition.  Will get help right away if you are not doing well or get worse. Document Released: 06/22/2005 Document Revised: 03/13/2012 Document Reviewed: 10/21/2011 [facility] Patient Information 2015 [place], [place]. This information is not intended to replace advice given to you by your health care provider. Make sure you discuss any questions you have with your health care provider.  Abnormal Uterine Bleeding Abnormal uterine  bleeding means bleeding from the vagina that is not your normal menstrual period. This can be:  Bleeding or spotting between periods.  Bleeding after sex (sexual intercourse).  Bleeding that is heavier or more than normal.  Periods that last longer than usual.  Bleeding after  menopause. There are many problems that may cause this. Treatment will depend on the cause of the bleeding. Any kind of bleeding that is not normal should be reviewed by your doctor.  HOME CARE Watch your condition for any changes. These actions may lessen any discomfort you are having:  Do not use tampons or douches as told by your doctor.  Change your pads often. You should get regular pelvic exams and Pap tests. Keep all appointments for tests as told by your doctor. GET HELP IF:  You are bleeding for more than 1 week.  You feel dizzy at times. GET HELP RIGHT AWAY IF:   You pass out.  You have to change pads every 15 to 30 minutes.  You have belly pain.  You have a fever.  You become sweaty or weak.  You are passing large blood clots from the vagina.  You feel sick to your stomach (nauseous) and throw up (vomit). MAKE SURE YOU:  Understand these instructions.  Will watch your condition.  Will get help right away if you are not doing well or get worse. Document Released: 07/10/2009 Document Revised: 09/17/2013 Document Reviewed: 04/11/2013 [facility] Patient Information 2015 [place], [place]. This information is not intended to replace advice given to you by your health care provider. Make sure you discuss any questions you have with your health care provider.

## 2015-05-26 NOTE — Telephone Encounter (Signed)
Rhonda Griffith took [place] to the emergency room at [facility]. Patient is going home. Still on Xarelto due to the bleeding has stopped. She will go to Ob/gyn in a couple days. Rhonda Griffith Just wanted to let you know.

## 2015-05-26 NOTE — ED Notes (Addendum)
Bright red vaginal bleeding beginning this AM.  Wearing a pad - for overactive bladder. Some cramping this morning. Patient on Zarelto 20 mg. Earlier this month. Did not take this AM.

## 2015-05-26 NOTE — ED Notes (Signed)
UTI in early August. Treated with Ceftin.  Completed Antibiotic coarse.

## 2015-05-26 NOTE — ED Notes (Signed)
Incontinent large amount of urine.  Linen changed.  Skin care given.  Patient tolerated well.. 

## 2015-05-26 NOTE — Telephone Encounter (Signed)
Spoke w/ Lupita Leash while she was here.

## 2015-05-26 NOTE — ED Provider Notes (Signed)
[facility] Emergency Department Provider Note  ____________________________________________  Time seen: 10:45 AM  I have reviewed the triage vital signs and the nursing notes.   HISTORY  Chief Complaint Vaginal Bleeding    HPI Rhonda Griffith is a 69 y.o. female who complains of vaginal bleeding today. She also has overactive bladder and gets frequent urinary tract infections. She does state Xarelto as well but has not taken it today. No dizziness chest pain shortness of breath fever chills nausea vomiting abdominal pain. She completed a course of Ceftin one month ago.She noticed a small amount of blood on a sanitary pad earlier today with some clotting.     Past Medical History  Diagnosis Date  . Atrial fibrillation, permanent   . Restless leg syndrome   . Pacemaker -Medtronic     implant for SSS  . 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 Leukocytosis   . Chronic kidney disease, stage I   . CVA (cerebral infarction) 04/28/15    Daughter states she was seen at [facility]     Patient Active Problem List   Diagnosis Date Noted  . Essential hypertension 12/09/2014  . Gait instability 09/08/2014  . Fatigue 10/01/2013  . Shortness of breath 09/12/2012  . Stress disorder, acute 09/12/2012  . Tachycardia 09/12/2012  . Atrial fibrillation 09/12/2012  . Sinoatrial node dysfunction   . Edema 06/07/2011  . Diastolic CHF, chronic 05/27/2011  . CARDIAC PACEMAKER IN SITU 10/03/2010     Past Surgical History  Procedure Laterality Date  . Insert / replace / remove pacemaker      implant for SSS  . Mitral valve replacement      s/p mechanical valve replaced with bovine valve 2010  . Melanoma excision      face  . Bladder injection    . Nasal sinus surgery    . Botox bladder       Current Outpatient Rx  Name  Route  Sig  Dispense  Refill  . albuterol (PROVENTIL  HFA;VENTOLIN HFA) 108 (90 BASE) MCG/ACT inhaler   Inhalation   Inhale 2 puffs into the lungs every 6 (six) hours as needed for wheezing.         [name] amLODipine (NORVASC) 10 MG tablet   Oral   Take 10 mg by mouth daily.         [name] ascorbic acid (VITAMIN C) 500 MG tablet   Oral   Take 500 mg by mouth daily.         [name] atorvastatin (LIPITOR) 80 MG tablet   Oral   Take 80 mg by mouth daily.         . calcium-vitamin D (OSCAL-500) 500-400 MG-UNIT per tablet   Oral   Take 1 tablet by mouth 2 (two) times daily.         . Cholecalciferol (VITAMIN D3) 1000 UNITS CAPS   Oral   Take 2,000 Units by mouth every 30 (thirty) days.          . cyclobenzaprine (FLEXERIL) 10 MG tablet   Oral   Take 10 mg by mouth at bedtime.          [name] DIAZEPAM PO   Oral   Take 5 mg by mouth as needed.          . diclofenac sodium (VOLTAREN) 1 % GEL   Topical   Apply 1 application topically  2 (two) times daily.           Tery Sanfilippo Calcium (STOOL SOFTENER PO)   Oral   Take 1 tablet by mouth 2 (two) times daily.         . DULoxetine (CYMBALTA) 30 MG capsule   Oral   Take 30 mg by mouth daily.         . furosemide (LASIX) 20 MG tablet   Oral   Take 20 mg by mouth daily as needed.          . magnesium oxide (MAG-OX) 400 MG tablet   Oral   Take 400 mg by mouth 2 (two) times daily.          . Multiple Vitamin (MULTIVITAMIN) tablet   Oral   Take 1 tablet by mouth daily.         [name] oxyCODONE-acetaminophen (PERCOCET) 5-325 MG per tablet   Oral   Take 1 tablet by mouth every 4 (four) hours as needed.           . pantoprazole (PROTONIX) 40 MG tablet   Oral   Take 40 mg by mouth daily.         . potassium chloride (K-DUR) 10 MEQ tablet   Oral   Take 1 tablet (10 mEq total) by mouth daily. Take with lasix   90 tablet   3   . rivaroxaban (XARELTO) 20 MG TABS tablet   Oral   Take 20 mg by mouth daily with supper.         [name] rOPINIRole (REQUIP XL) 2 MG 24 hr  tablet   Oral   Take 2 mg by mouth at bedtime.           [name] apixaban (ELIQUIS) 5 MG TABS tablet   Oral   Take 1 tablet (5 mg total) by mouth 2 (two) times daily.   60 tablet   3   . cefUROXime (CEFTIN) 250 MG tablet   Oral   Take 1 tablet (250 mg total) by mouth 2 (two) times daily.   20 tablet   0   . diltiazem (DILACOR XR) 180 MG 24 hr capsule      TAKE 1 CAPSULE BY MOUTH EVERY DAY   90 capsule   3   . fluticasone (FLONASE) 50 MCG/ACT nasal spray   Each Nare   Place into both nostrils daily.            Allergies Morphine; Tape; Cephalexin; Latex; and Tizanidine   Family History  Problem Relation Age of Onset  . Family history unknown: Yes    Social History Social History  Substance Use Topics  . Smoking status: Former Smoker -- 1.00 packs/day for 5 years    Types: Cigarettes    Quit date: 12/29/1970  . Smokeless tobacco: None  . Alcohol Use: No    Review of Systems  Constitutional:   No fever or chills. No weight changes Eyes:   No blurry vision or double vision.  ENT:   No sore throat. Cardiovascular:   No chest pain. Respiratory:   No dyspnea or cough. Gastrointestinal:   Negative for abdominal pain, vomiting and diarrhea.  No BRBPR or melena. Genitourinary:   Negative for dysuria, urinary retention, bloody urine, or difficulty urinating. Positive vaginal bleeding as above Musculoskeletal:   Negative for back pain. No joint swelling or pain. Skin:   Negative for rash. Neurological:   Negative for headaches, focal weakness or numbness. Psychiatric:  No anxiety or depression.   Endocrine:  No hot/cold intolerance, changes in energy, or sleep difficulty.  10-point ROS otherwise negative.  ____________________________________________   PHYSICAL EXAM:  VITAL SIGNS: ED Triage Vitals  Enc Vitals Group     BP 05/26/15 1027 147/63 mmHg     Pulse Rate 05/26/15 1027 77     Resp --      Temp 05/26/15 1027 97.2 F (36.2 C)     Temp Source  05/26/15 1027 Oral     SpO2 05/26/15 1027 97 %     Weight 05/26/15 1027 180 lb (81.647 kg)     Height 05/26/15 1027 5\' 9"  (1.753 m)     Head Cir --      Peak Flow --      Pain Score 05/26/15 1104 0     Pain Loc --      Pain Edu? --      Excl. in GC? --    Examined with the nurse at the bedside  Constitutional:   Alert and oriented. Well appearing and in no distress. Eyes:   No scleral icterus. No conjunctival pallor. PERRL. EOMI ENT   Head:   Normocephalic and atraumatic.   Nose:   No congestion/rhinnorhea. No septal hematoma   Mouth/Throat:   MMM, no pharyngeal erythema. No peritonsillar mass. No uvula shift.   Neck:   No stridor. No SubQ emphysema. No meningismus. Hematological/Lymphatic/Immunilogical:   No cervical lymphadenopathy. Cardiovascular:   RRR. Normal and symmetric distal pulses are present in all extremities. No murmurs, rubs, or gallops. Respiratory:   Normal respiratory effort without tachypnea nor retractions. Breath sounds are clear and equal bilaterally. No wheezes/rales/rhonchi. Gastrointestinal:   Mild suprapubic tenderness. No distention. There is no CVA tenderness.  No rebound, rigidity, or guarding. Genitourinary:   Normal external genitalia. Speculum exam reveals a scant amount of old blood in the vault. No lesions or active bleeding. Bimanual exam unremarkable. Musculoskeletal:   Nontender with normal range of motion in all extremities. No joint effusions.  No lower extremity tenderness.  No edema. Neurologic:   Normal speech and language.  CN 2-10 normal. Motor grossly intact.   Normal gait. No gross focal neurologic deficits are appreciated.  Skin:    Skin is warm, dry and intact. No rash noted.  No petechiae, purpura, or bullae. Psychiatric:   Mood and affect are normal. Speech and behavior are normal. Patient exhibits appropriate insight and judgment.  ____________________________________________    LABS (pertinent  positives/negatives) (all labs ordered are listed, but only abnormal results are displayed) Labs Reviewed  PROTIME-INR - Abnormal; Notable for the following:    Prothrombin Time 17.3 (*)    All other components within normal limits  BASIC METABOLIC PANEL - Abnormal; Notable for the following:    Glucose, Bld 175 (*)    Creatinine, Ser 1.27 (*)    GFR calc non Af Amer 41 (*)    GFR calc Af Amer 48 (*)    All other components within normal limits  CBC WITH DIFFERENTIAL/PLATELET - Abnormal; Notable for the following:    RDW 16.4 (*)    Neutro Abs 7.6 (*)    All other components within normal limits  URINALYSIS COMPLETEWITH MICROSCOPIC ([facility] ONLY) - Abnormal; Notable for the following:    Color, Urine YELLOW (*)    APPearance CLOUDY (*)    Leukocytes, UA 3+ (*)    Bacteria, UA RARE (*)    All other components within normal limits  URINE  CULTURE  APTT   ____________________________________________   EKG    ____________________________________________   RADIOLOGY    ____________________________________________   PROCEDURES   ____________________________________________   INITIAL IMPRESSION / ASSESSMENT AND PLAN / ED COURSE  Pertinent labs & imaging results that were available during my care of the patient were reviewed by me and considered in my medical decision making (see chart for details).  Elderly patient presents with vaginal bleeding. Never had anything like this. Before. She has remotely postmenopausal. No relation procedures or instrumentation or trauma. Exam is overall unremarkable. I counseled the patient to continue her Xarelto as she does not appear to be a risk of significant hemorrhage. She is also found to have a urinary tract infection with cystitis, so we will start her on antibiotics. I reviewed available culture data from the beginning of August which shows that her last infection was resistant to almost all antibiotics but susceptible to Ceftin. She  tolerated this last time so we will continue this again. We'll have her follow up with primary care regarding the urinary tract infection as well as gynecology regarding the abnormal vaginal bleeding in an elderly woman. No evidence of sepsis or other organ involvement at this time.     ____________________________________________   FINAL CLINICAL IMPRESSION(S) / ED DIAGNOSES  Final diagnoses:  Vaginal bleeding  Cystitis      Sharman Cheek, MD 05/26/15 [id number]

## 2015-05-26 NOTE — Telephone Encounter (Signed)
Pt daughter called, states that when pt woke up, pt urine was "full of blood". States pt is on Xarelto. States she is taking her to the hospital. Please call.

## 2015-05-27 ENCOUNTER — Telehealth: Payer: Self-pay | Admitting: *Deleted

## 2015-05-27 NOTE — Telephone Encounter (Signed)
lmov for pt was being referred to Dr Kirke Corin by Dr Mariah Milling  Needed to schedule that apt.

## 2015-05-28 LAB — URINE CULTURE

## 2015-06-03 ENCOUNTER — Ambulatory Visit (INDEPENDENT_AMBULATORY_CARE_PROVIDER_SITE_OTHER): Payer: Medicare Other | Admitting: Obstetrics and Gynecology

## 2015-06-03 ENCOUNTER — Encounter: Payer: Self-pay | Admitting: Obstetrics and Gynecology

## 2015-06-03 VITALS — BP 140/82 | HR 78 | Ht 69.0 in | Wt 190.3 lb

## 2015-06-03 DIAGNOSIS — N950 Postmenopausal bleeding: Secondary | ICD-10-CM

## 2015-06-03 DIAGNOSIS — Z7901 Long term (current) use of anticoagulants: Secondary | ICD-10-CM | POA: Diagnosis not present

## 2015-06-03 NOTE — Progress Notes (Signed)
GYNECOLOGY PROGRESS NOTE  Subjective:    Patient ID: Rhonda Griffith, female    DOB: [birth year], 69 y.o.   MRN: [id number]  HPI  Patient is a 69 y.o. postmenopausal female with multiple comorbidities (including h/o anticoagulant therapy use) who presents for f/u from ER due to episode of PMB.  Patient reports episode occurred ~ 1 week ago.  Has been postmenopausal for ~ 20 years, with no previous episodes of PMB.  Reports that she went to the bathroom, and noted gross blood in the toilet.  Was seen in the ER, where no immediate cause of the bleeding was noted. Was referred to GYN for further f/u and management.   Of note, patient was diagnosed with a UTI, and is currently completing antibiotic therapy. Also reports recent changes in anticoagulant therapy (switched from Eliquis to [place] ~ 1 month ago, and prior to this was on Coumadin 3-4 months ago x 20 years).   Past Medical History  Diagnosis Date  . Atrial fibrillation, permanent   . Restless leg syndrome   . Pacemaker -Medtronic     implant for SSS  . 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 Leukocytosis   . Chronic kidney disease, stage I   . CVA (cerebral infarction) 04/28/15    Daughter states she was seen at [facility]   Past Surgical History  Procedure Laterality Date  . Insert / replace / remove pacemaker      implant for SSS  . Mitral valve replacement      s/p mechanical valve replaced with bovine valve 2010  . Melanoma excision      face  . Bladder injection    . Nasal sinus surgery    . Botox bladder     Social History  Substance Use Topics  . Smoking status: Former Smoker -- 1.00 packs/day for 5 years    Types: Cigarettes    Quit date: 12/29/1970  . Smokeless tobacco: None  . Alcohol Use: No      Medication List       This list is accurate as of: 06/03/15  7:41 PM.  Always use your most recent med list.               albuterol 108  (90 BASE) MCG/ACT inhaler  Commonly known as:  PROVENTIL HFA;VENTOLIN HFA  Inhale 2 puffs into the lungs every 6 (six) hours as needed for wheezing.     amLODipine 10 MG tablet  Commonly known as:  NORVASC  Take 10 mg by mouth daily.     amLODipine 10 MG tablet  Commonly known as:  NORVASC  Take by mouth.     ascorbic acid 500 MG tablet  Commonly known as:  VITAMIN C  Take 500 mg by mouth daily.     atorvastatin 80 MG tablet  Commonly known as:  LIPITOR  Take 80 mg by mouth daily.     atorvastatin 80 MG tablet  Commonly known as:  LIPITOR  Take by mouth.     calcium-vitamin D 500-400 MG-UNIT per tablet  Commonly known as:  OSCAL-500  Take 1 tablet by mouth 2 (two) times daily.     cefUROXime 250 MG tablet  Commonly known as:  CEFTIN  Take 1 tablet (250 mg total) by mouth 2 (two) times daily.     cyclobenzaprine 10 MG tablet  Commonly known as:  FLEXERIL  Take 10  mg by mouth at bedtime.     DIAZEPAM PO  Take 5 mg by mouth as needed.     diclofenac sodium 1 % Gel  Commonly known as:  VOLTAREN  Apply 1 application topically 2 (two) times daily.     diltiazem 180 MG 24 hr capsule  Commonly known as:  DILACOR XR  TAKE 1 CAPSULE BY MOUTH EVERY DAY     DULoxetine 30 MG capsule  Commonly known as:  CYMBALTA  Take 30 mg by mouth daily.     fluticasone 50 MCG/ACT nasal spray  Commonly known as:  FLONASE  Place into both nostrils daily.     furosemide 20 MG tablet  Commonly known as:  LASIX  Take 20 mg by mouth daily as needed.     magnesium oxide 400 MG tablet  Commonly known as:  MAG-OX  Take 400 mg by mouth 2 (two) times daily.     multivitamin tablet  Take 1 tablet by mouth daily.     oxyCODONE-acetaminophen 5-325 MG per tablet  Commonly known as:  PERCOCET/ROXICET  Take 1 tablet by mouth every 4 (four) hours as needed.     pantoprazole 40 MG tablet  Commonly known as:  PROTONIX  Take 40 mg by mouth daily.     pantoprazole 40 MG tablet  Commonly  known as:  PROTONIX  Take by mouth.     potassium chloride 10 MEQ tablet  Commonly known as:  K-DUR  Take 1 tablet (10 mEq total) by mouth daily. Take with lasix     rivaroxaban 20 MG Tabs tablet  Commonly known as:  XARELTO  Take 20 mg by mouth daily with supper.     XARELTO 20 MG Tabs tablet  Generic drug:  rivaroxaban  Take by mouth.     rOPINIRole 2 MG 24 hr tablet  Commonly known as:  REQUIP XL  Take 2 mg by mouth at bedtime.     rOPINIRole 2 MG tablet  Commonly known as:  REQUIP     STOOL SOFTENER PO  Take 1 tablet by mouth 2 (two) times daily.     Vitamin D3 1000 UNITS Caps  Take 2,000 Units by mouth every 30 (thirty) days.        Allergies  Allergen Reactions  . Morphine   . Tape     Allergic to plastic/latex tape. Only use paper tape on patient.  . Cephalexin Rash  . Latex Rash  . Tizanidine Palpitations    Review of Systems Constitutional: negative for chills, fatigue, fevers and sweats Eyes: negative for irritation, redness and visual disturbance Ears, nose, mouth, throat, and face: negative for hearing loss, nasal congestion, snoring and tinnitus Respiratory: negative for asthma, cough, sputum Cardiovascular: negative for chest pain, dyspnea, exertional chest pressure/discomfort, irregular heart beat, palpitations and syncope Gastrointestinal: negative for abdominal pain, change in bowel habits, nausea and vomiting Genitourinary: positive for vaginal bleeding; negative for genital lesions, sexual problems and vaginal discharge, dysuria and urinary incontinence Integument/breast: negative for breast lump, breast tenderness and nipple discharge Hematologic/lymphatic: negative for bleeding and easy bruising Musculoskeletal:negative for back pain and muscle weakness Neurological: negative for dizziness, headaches, vertigo and weakness Endocrine: negative for diabetic symptoms including polydipsia, polyuria and skin dryness Allergic/Immunologic: negative  for hay fever and urticaria    Objective:   Blood pressure 140/82, pulse 78, height 5\' 9"  (1.753 m), weight 190 lb 4.8 oz (86.32 kg). General appearance: alert, cooperative and no distress Abdomen: soft, non-tender; bowel  sounds normal; no masses,  no organomegaly Pelvic: External genitalia normal without lesion. Vagina with moderate-severe vaginal atrophy, pallor.  No lesions or lacerations within vagina. Urethra with moderate pallor.  Rectovaginal septum normal. Cervix flushed to vaginal wall, nontender, no lesions Uterus senile, mobile, no masses.  Adnexae nontender, non-palpable.  Extremities: extremities normal, atraumatic, no cyanosis or edema Neurologic: Grossly normal, except patient uses rolling walker for aided mobility   Assessment:   Postmenopausal bleeding  Plan:   Discussed etiologies of postmenopausal bleeding, including h/o anticoagulant therapy, moderate-severe vaginal/endometrial atrophy, concern about precancerous/hyperplasia or cancerous etiology. Will order pelvic ultrasound to assess endometrial stripe.  If stripe thickened, will proceed with workup for PMB to r/o malignancy, including endometrial biopsy/Endosee.  If stripe thin, bleeding may be due to alternative less worisome cause and will manage accordingly.  RTC in 2-3 weeks after ultrasound.    Hildred Laser, MD [facility]

## 2015-06-22 ENCOUNTER — Ambulatory Visit: Payer: Medicare Other | Admitting: Cardiovascular Disease

## 2015-06-23 ENCOUNTER — Ambulatory Visit: Payer: Medicare Other | Admitting: Obstetrics and Gynecology

## 2015-06-23 ENCOUNTER — Other Ambulatory Visit: Payer: Medicare Other

## 2015-06-25 ENCOUNTER — Encounter: Payer: Self-pay | Admitting: Cardiovascular Disease

## 2015-06-25 ENCOUNTER — Ambulatory Visit (INDEPENDENT_AMBULATORY_CARE_PROVIDER_SITE_OTHER): Payer: Medicare Other | Admitting: Cardiovascular Disease

## 2015-06-25 VITALS — BP 120/66 | HR 67 | Ht 69.0 in | Wt 189.0 lb

## 2015-06-25 VITALS — BP 120/60 | HR 64 | Ht 69.0 in | Wt 189.0 lb

## 2015-06-25 DIAGNOSIS — R2681 Unsteadiness on feet: Secondary | ICD-10-CM | POA: Diagnosis not present

## 2015-06-25 DIAGNOSIS — Z7189 Other specified counseling: Secondary | ICD-10-CM | POA: Diagnosis not present

## 2015-06-25 DIAGNOSIS — I639 Cerebral infarction, unspecified: Secondary | ICD-10-CM

## 2015-06-25 DIAGNOSIS — I482 Chronic atrial fibrillation, unspecified: Secondary | ICD-10-CM

## 2015-06-25 DIAGNOSIS — I739 Peripheral vascular disease, unspecified: Secondary | ICD-10-CM | POA: Diagnosis not present

## 2015-06-25 DIAGNOSIS — I10 Essential (primary) hypertension: Secondary | ICD-10-CM

## 2015-06-25 DIAGNOSIS — I4891 Unspecified atrial fibrillation: Secondary | ICD-10-CM | POA: Diagnosis not present

## 2015-06-25 DIAGNOSIS — I5032 Chronic diastolic (congestive) heart failure: Secondary | ICD-10-CM

## 2015-06-25 MED ORDER — WARFARIN SODIUM 2 MG PO TABS: 4.0000 mg | ORAL_TABLET | Freq: Every day | ORAL | Status: DC

## 2015-06-25 MED ORDER — FUROSEMIDE 20 MG PO TABS: 20.0000 mg | ORAL_TABLET | Freq: Every day | ORAL | Status: DC | PRN

## 2015-06-25 NOTE — Assessment & Plan Note (Addendum)
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 She also has other symptoms suggestive of other etiologies such as neuropathy and arthritis contributing to her symptoms. There is obviously the possibility of embolic disease affecting her lower extremities from atrial fibrillation. However, it appears that her symptoms were gradual in onset and. She had a recent stroke with left-sided weakness and has not been as active as before. I recommend reevaluating her symptoms in about 2 months. If she has significant left leg claudication, and the plan is to proceed with angiography and possible endovascular intervention.

## 2015-06-25 NOTE — Patient Instructions (Addendum)
Please start warfarin 4 mg daily (start today)  In three days, hold the xarelto (Sunday Am)  Please call us if you have new issues that need to be addressed before your next appt.  Your physician wants you to follow-up in: 6 months.  You will receive a reminder letter in the mail two months in advance. If you don't receive a letter, please call our office to schedule the follow-up appointment.  Vitamin K Foods and Warfarin Warfarin is a medicine that helps prevent harmful blood clots by causing blood to clot more slowly. It does this by decreasing the activity of vitamin K, which promotes normal blood clotting. For the dose of warfarin you have been prescribed to work well, you need to get about the same amount of vitamin K from your food from day to day. Suddenly getting a lot more vitamin K could cause your blood to clot too quickly. A sudden decrease in vitamin K intake could cause your blood to clot too slowly. These changes in vitamin K intake could lead to dangerous blood clotsor to bleeding. WHAT GENERAL GUIDELINES DO I NEED TO FOLLOW?  Keep your intake of vitamin K consistent from day to day. To do this, you must be aware of which foods contain moderate or high amounts of vitamin K. Listed below are some foods that are very high, high, or moderately high in vitamin K. If you eat these foods, make sure you eat a consistent amount of them from day to day.  Avoid major changes in your diet, or tell your health care provider before changing your diet.  If you take a multivitamin that contains vitamin K, be sure to take it every day.  If you drink green tea, drink the same amount each day. WHAT FOODS ARE VERY HIGH IN VITAMIN K?   Greens, such as Swiss chard and beet, collard, mustard, or turnip greens (fresh or frozen, cooked).  Kale (fresh or frozen, cooked).   Parsley (raw).  Spinach (cooked).  WHAT FOODS ARE HIGH IN VITAMIN K?  Asparagus (frozen, cooked).  Beans, green  (frozen, cooked).  Broccoli.   Bok choy (cooked).   Brussels sprouts (fresh or frozen, cooked).  Cabbage (cooked).  Coleslaw. WHAT FOODS ARE MODERATELY HIGH IN VITAMIN K?  Blueberries.  Black-eyed peas.  Endive (raw).   Green leaf lettuce (raw).   Green scallions (raw).  Kale (raw).  Okra (frozen, cooked).  Plantains (fried).  Romaine lettuce (raw).   Sauerkraut (canned).   Spinach (raw). Document Released: 07/10/2009 Document Revised: 09/17/2013 Document Reviewed: 07/17/2013 [facility] Patient Information 2015 [place], [place]. This information is not intended to replace advice given to you by your health care provider. Make sure you discuss any questions you have with your health care provider. Warfarin Coagulopathy Warfarin (Coumadin) coagulopathy refers to bleeding that may occur as a complication of the medicine warfarin. Warfarin is an oral blood thinner (anticoagulant). Warfarin is used for medical conditions where thinning of the blood is needed to prevent blood clots.  CAUSES Bleeding is the most common and most serious complication of warfarin. The amount of bleeding is related to the warfarin dose and length of treatment. In addition, bleeding complications can also occur due to:  Intentional or accidental warfarin overdose.  Underlying medical conditions.  Dietary changes.  Medicine, herbal, supplement, or alcohol interactions. SYMPTOMS Severe bleeding while on warfarin may occur from any tissue or organ. Symptoms of the blood being too thin may include:  Bleeding from the nose or  gums.  Blood in bowel movements which may appear as bright red, dark, or black tarry stools.  Blood in the urine which may appear as pink, red, or brown urine.  Unusual bruising or bruising easily.  A cut that does not stop bleeding within 10 minutes.  Vomiting blood or continuous nausea for more than 1 day.  Coughing up blood.  Broken blood vessels in your  eye (subconjunctival hemorrhage).  Abdominal or back pain with or without flank bruising.  Sudden, severe headache.  Sudden weakness or numbness of the face, arm, or leg, especially on one side of the body.  Sudden confusion.  Trouble speaking (aphasia) or understanding.  Sudden trouble seeing in one or both eyes.  Sudden trouble walking.  Dizziness.  Loss of balance or coordination.  Vaginal bleeding.  Swelling or pain at an injection site.  Superficial fat tissue death (necrosis) which may cause skin scarring. This is more common in women and may first present as pain in the waist, thighs, or buttocks.  Fever. HOME CARE INSTRUCTIONS  Always contact your health care provider of any concerns or signs of possible warfarin coagulopathy as soon as possible.  Take warfarin exactly as directed by your health care provider. It is recommended that you take your warfarin dose at the same time of the day. If you have been told to stop taking warfarin, do not resume taking warfarin until directed to do so by your health care provider. Follow your health care provider's instructions if you accidentally take an extra dose or miss a dose of warfarin. It is very important to take warfarin as directed since bleeding or blood clots could result in chronic or permanent injury, pain, or disability.  Keep all follow-up appointments with your health care provider as directed. It is very important to keep your appointments. Not keeping appointments could result in a chronic or permanent injury, pain, or disability because warfarin is a medicine that requires close monitoring.  While taking warfarin, you will need to have regular blood tests to measure your blood clotting time. These blood tests usually include both the prothrombin time (PT) and International Normalized Ratio (INR) tests. The PT and INR results allow your health care provider to adjust your dose of warfarin. The dose can change for many  reasons. It is critically important that you have your PT and INR levels drawn exactly as directed. Your warfarin dose may stay the same or change depending on what the PT and INR results are. Be sure to follow up with your health care provider regarding your PT and INR test results and what your warfarin dosage should be.  Many medicines can interfere with warfarin and affect the PT and INR results. You must tell your health care provider about any and all medicines you take. This includes all vitamins and supplements. Ask your health care provider before taking these. Prescription and over-the-counter medicine consistency is critical to warfarin management. It is important that potential interactions are checked before you start a new medicine. Be especially cautious with aspirin and anti-inflammatory medicines. Ask your health care provider before taking these. Medicines such as antibiotics and acid-reducing medicine can interact with warfarin and can cause an increased warfarin effect. Warfarin can also interfere with the effectiveness of medicines you are taking. Do not take or discontinue any prescribed or over-the-counter medicine except on the advice of your health care provider or pharmacist.  Some vitamins, supplements, and herbal products interfere with the effectiveness of warfarin. Vitamin  E may increase the anticoagulant effects of warfarin. Vitamin K can cause warfarin to be less effective. Do not take or discontinue any vitamin, supplement, or herbal product except on the advice of your health care provider or pharmacist.  Eat what you normally eat and keep the vitamin K content of your diet consistent. Avoid major changes in your diet, or notify your health care provider before changing your diet. Suddenly getting a lot more vitamin K could cause your blood to clot too quickly. A sudden decrease in vitamin K intake could cause your blood to clot too slowly. These changes in vitamin K intake  could lead to dangerous blood clotsor to bleeding. To keep your vitamin K intake consistent, you must be aware of which foods contain moderate or high amounts of vitamin K. Some foods high in vitamin K include spinach, kale, broccoli, cabbage, greens, Brussels sprouts, asparagus, bok choy, coleslaw, parsley, and green tea. Arrange a visit with a dietitian to answer your questions.  If you have a loss of appetite or get the stomach flu (viral gastroenteritis), talk to your health care provider as soon as possible. A decrease in your normal vitamin K intake can make you more sensitive to your usual dose of warfarin.  Some medical conditions may increase your risk for bleeding while you are taking warfarin. A fever, diarrhea lasting more than a day, worsening heart failure, or worsening liver function are some medical conditions that could affect warfarin. Contact your health care provider if you have any of these medical conditions.  Be careful not to cut yourself when using sharp objects or while shaving.  Alcohol can change the body's ability to handle warfarin. It is best to avoid alcoholic drinks or consume only very small amounts while taking warfarin. Notify your health care provider if you change your alcohol intake. A sudden increase in alcohol use can increase your risk of bleeding. Chronic alcohol use can cause warfarin to be less effective.  Limit physical activities or sports that could result in a fall or cause injury.  Do not use warfarin if you are pregnant.  Inform all your health care providers and your dentist that you take warfarin.  Inform all health care providers if you are taking warfarin and aspirin or platelet inhibitor medicines such as clopidogrel, ticagrelor, or prasugrel. Use of these medicines in addition to warfarin can increase your risk of bleeding or death. Taking these medicines together should only be done under the direct care of your health care providers. SEEK  IMMEDIATE MEDICAL CARE IF:  You cough up blood.  You have dark or black stools or there is bright red blood coming from your rectum.  You vomit blood or have nausea for more than 1 day.  You have blood in the urine or pink-colored urine.  You have unusual bruising or have increased bruising.  You have bleeding from the nose or gums that does not stop quickly.  You have a cut that does not stop bleeding within 2-3 minutes.  You have sudden weakness or numbness of the face, arm, or leg, especially on one side of the body.  You have sudden confusion.  You have trouble speaking (aphasia) or understanding.  You have sudden trouble seeing in one or both eyes.  You have sudden trouble walking.  You have dizziness.  You have a loss of balance or coordination.  You have a sudden, severe headache.  You have a serious fall or head injury, even if you  are not bleeding.  You have swelling or pain at an injection site.  You have unexplained tenderness or pain in the abdomen, back, waist, thighs, or buttocks.  You have a fever. Any of these symptoms may represent a serious problem that is an emergency. Do not wait to see if the symptoms will go away. Get medical help right away. Call your local emergency services (911 in [place] Do not drive yourself to the hospital. Document Released: 08/21/2006 Document Revised: 01/27/2014 Document Reviewed: 02/21/2012 [facility] Patient Information 2015 [place], [place]. This information is not intended to replace advice given to you by your health care provider. Make sure you discuss any questions you have with your health care provider. Warfarin: What You Need to Know Warfarin is an anticoagulant. Anticoagulants help prevent the formation of blood clots. They also help stop the growth of blood clots. Warfarin is sometimes referred to as a "blood thinner."  Normally, when body tissues are cut or damaged, the blood clots in order to prevent blood loss.  Sometimes clots form inside your blood vessels and obstruct the flow of blood through your circulatory system (thrombosis). These clots may travel through your bloodstream and become lodged in smaller blood vessels in your brain, which can cause a stroke, or in your lungs (pulmonary embolism). WHO SHOULD USE WARFARIN? Warfarin is prescribed for people at risk of developing harmful blood clots:  People with surgically implanted mechanical heart valves, irregular heart rhythms called atrial fibrillation, and certain clotting disorders.  People who have developed harmful blood clotting in the past, including those who have had a stroke or a pulmonary embolism, or thrombosis in their legs (deep vein thrombosis [DVT]).  People with an existing blood clot, such as a pulmonary embolism. WARFARIN DOSING Warfarin tablets come in different strengths. Each tablet strength is a different color, with the amount of warfarin (in milligrams) clearly printed on the tablet. If the color of your tablet is different than usual when you receive a new prescription, report it immediately to your pharmacist or health care provider. WARFARIN MONITORING The goal of warfarin therapy is to lessen the clotting tendency of blood but not prevent clotting completely. Your health care provider will monitor the anticoagulation effect of warfarin closely and adjust your dose as needed. For your safety, blood tests called prothrombin time (PT) or international normalized ratio (INR) are used to measure the effects of warfarin. Both of these tests can be done with a finger stick or a blood draw. The longer it takes the blood to clot, the higher the PT or INR. Your health care provider will inform you of your "target" PT or INR range. If, at any time, your PT or INR is above the target range, there is a risk of bleeding. If your PT or INR is below the target range, there is a risk of clotting. Whether you are started on warfarin while you  are in the hospital or in your health care provider's office, you will need to have your PT or INR checked within one week of starting the medicine. Initially, some people are asked to have their PT or INR checked as much as twice a week. Once you are on a stable maintenance dose, the PT or INR is checked less often, usually once every 2 to 4 weeks. The warfarin dose may be adjusted if the PT or INR is not within the target range. It is important to keep all laboratory and health care provider follow-up appointments. Not keeping  appointments could result in a chronic or permanent injury, pain, or disability because warfarin is a medicine that requires close monitoring. WHAT ARE THE SIDE EFFECTS OF WARFARIN?  Too much warfarin can cause bleeding (hemorrhage) from any part of the body. This may include bleeding from the gums, blood in the urine, bloody or dark stools, a nosebleed that is not easily stopped, coughing up blood, or vomiting blood.  Too little warfarin can increase the risk of blood clots.  Too little or too much warfarin can also increase the risk of a stroke.  Warfarin use may cause a skin rash or irritation, an unusual fever, continual nausea or stomach upset, or severe pain in your joints or back. SPECIAL PRECAUTIONS WHILE TAKING WARFARIN Warfarin should be taken exactly as directed. It is very important to take warfarin as directed since bleeding or blood clots could result in chronic or permanent injury, pain, or disability.  Take your medicine at the same time every day. If you forget to take your dose, you can take it if it is within 6 hours of when it was due.  Do not change the dose of warfarin on your own to make up for missed or extra doses.  If you miss more than 2 doses in a row, you should contact your health care provider for advice. Avoid situations that cause bleeding. You may have a tendency to bleed more easily than usual while taking warfarin. The following actions  can limit bleeding:  Using a softer toothbrush.  Flossing with waxed floss rather than unwaxed floss.  Shaving with an Neurosurgeon rather than a blade.  Limiting the use of sharp objects.  Avoiding potentially harmful activities, such as contact sports. Warfarin and Pregnancy or Breastfeeding  Warfarin is not advised during the first trimester of pregnancy due to an increased risk of birth defects. In certain situations, a woman may take warfarin after her first trimester of pregnancy. A woman who becomes pregnant or plans to become pregnant while taking warfarin should notify her health care provider immediately.  Although warfarin does not pass into breast milk, a woman who wishes to breastfeed while taking warfarin should also consult with her health care provider. Alcohol, Smoking, and Illicit Drug Use  Alcohol affects how warfarin works in the body. It is best to avoid alcoholic drinks or consume very small amounts while taking warfarin. In general, alcohol intake should be limited to 1 oz (30 mL) of liquor, 6 oz (180 mL) of wine, or 12 oz (360 mL) of beer each day. Notify your health care provider if you change your alcohol intake.  Smoking affects how warfarin works. It is best to avoid smoking while taking warfarin. Notify your health care provider if you change your smoking habits.  It is best to avoid all illicit drugs while taking warfarin since there are few studies that show how warfarin interacts with these drugs. Other Medicines and Dietary Supplements Many prescription and over-the-counter medicines can interfere with warfarin. Be sure all of your health care providers know you are taking warfarin. Notify your health care provider who prescribed warfarin for you or your pharmacist before starting or stopping any new medicines, including over-the-counter vitamins, dietary supplements, and pain medicines. Your warfarin dose may need to be adjusted. Some common  over-the-counter medicines that may increase the risk of bleeding while taking warfarin include:   Acetaminophen.  Aspirin.  Nonsteroidal anti-inflammatory medicines (NSAIDs), such as ibuprofen or naproxen.  Vitamin E. Dietary Considerations  Foods that have moderate or high amounts of vitamin K can interfere with warfarin. Avoid major changes in your diet or notify your health care provider before changing your diet. Eat a consistent amount of foods that have moderate or high amounts of vitamin K. Eating less foods containing vitamin K can increase the risk of bleeding. Eating more foods containing vitamin K can increase the risk of blood clots. Additional questions about dietary considerations can be discussed with a dietitian. Foods that are very high in vitamin K:  Greens, such as Swiss chard and beet, collard, mustard, or turnip greens (fresh or frozen, cooked).  Kale (fresh or frozen, cooked).  Parsley (raw).  Spinach (cooked). Foods that are high in vitamin K:  Asparagus (frozen, cooked).  Beans, green (frozen, cooked).  Broccoli.  Bok choy (cooked).  Brussels sprouts (fresh or frozen, cooked).  Cabbage (cooked).   Coleslaw. Foods that are moderately high in vitamin K:  Blueberries.  Black-eyed peas.  Endive (raw).  Green leaf lettuce (raw).  Green scallions (raw).  Kale (raw).  Okra (frozen, cooked).  Plantains (fried).  Romaine lettuce (raw).  Sauerkraut (canned).  Spinach (raw). CALL YOUR CLINIC OR HEALTH CARE PROVIDER IF YOU:  Plan to have any surgery or procedure.  Feel sick, especially if you have diarrhea or vomiting.  Experience or anticipate any major changes in your diet.  Start or stop a prescription or over-the-counter medicine.  Become, plan to become, or think you may be pregnant.  Are having heavier than usual menstrual periods.  Have had a fall, accident, or any symptoms of bleeding or unusual bruising.  Develop an  unusual fever. CALL 911 IN THE [place] OR GO TO THE EMERGENCY DEPARTMENT IF YOU:   Think you may be having an allergic reaction to warfarin. The signs of an allergic reaction could include itching, rash, hives, swelling, chest tightness, or trouble breathing.  See signs of blood in your urine. The signs could include reddish, pinkish, or tea-colored urine.  See signs of blood in your stools. The signs could include bright red or black stools.  Vomit or cough up blood. In these instances, the blood could have either a bright red or a "coffee-grounds" appearance.  Have bleeding that will not stop after applying pressure for 30 minutes such as cuts, nosebleeds, or other injuries.  Have severe pain in your joints or back.  Have a new and severe headache.  Have sudden weakness or numbness of your face, arm, or leg, especially on one side of your body.  Have sudden confusion or trouble understanding.  Have sudden trouble seeing in one or both eyes.  Have sudden trouble walking, dizziness, loss of balance, or coordination.  Have trouble speaking or understanding (aphasia). Document Released: 09/12/2005 Document Revised: 01/27/2014 Document Reviewed: 03/08/2013 [facility] Patient Information 2015 [place], [place]. This information is not intended to replace advice given to you by your health care provider. Make sure you discuss any questions you have with your health care provider.

## 2015-06-25 NOTE — Progress Notes (Addendum)
Patient ID: Rhonda Griffith, female    DOB: August 20, 1946, 69 y.o.   MRN: [id number]  HPI Comments: 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 History of bilateral knee replacements She presents today for follow-up of her atrial fibrillation  after recent stroke History of sleep apnea, not on CPAP  In follow-up, she reports that August 2 she had acute stroke Daughter found her in bed with left side weakness arm and leg, she was taken to [facility], had catheter thrombectomy, discharge 04/30/2015 Participating in PT  In the hospital she was changed from Eliquis to Xarelto.  She reports having heavy vaginal bleeding last month, seen by GYN, Dr. Valentino Saxon. Schedule for ultrasound. No further bleeding since that time and she has been taking Xarelto  EKG on today's visit shows atrial fibrillation,   rate 67 bpm Paced beats  Biggest complaint is left lower extremity toe drop, foot drop. Seen by orthopedics and recommended to wear a total left. Patient and family reports she seems to be going backwards in terms of her left lower extremity weakness  Discussed anticoagulation with her. Previously she was on 3-4 mg daily  Other past medical history Previously had shortness of breath, improved with Lasix.  Previously at home, She spends significant time playing on the computer  She does take a pain pill occasionally to sleep better.  Valium for anxiety. severe bilateral knee pain.  Statin was held in the past for leg pain, Zocor.  Previous spinal tap at [facility] for high pressure in her ventricle. She reports no significant improvement  after this procedure (drain?).  Placement of a bladder stimulator implant for overactive bladder was denied despite cardiac clearance . Total cholesterol 184, LDL 98, HDL 63    Allergies  Allergen Reactions  . Morphine   . Tape     Allergic to plastic/latex tape. Only use paper tape on patient.  . Cephalexin Rash  . Latex Rash  . Tizanidine Palpitations    Outpatient Encounter Prescriptions as of 06/25/2015  Medication Sig  . albuterol (PROVENTIL HFA;VENTOLIN HFA) 108 (90 BASE) MCG/ACT inhaler Inhale 2 puffs into the lungs every 6 (six) hours as needed for wheezing.  [name] amLODipine (NORVASC) 10 MG tablet Take 10 mg by mouth daily.  [name] ascorbic acid (VITAMIN C) 500 MG tablet Take 500 mg by mouth daily.  [name] atorvastatin (LIPITOR) 80 MG tablet Take 80 mg by mouth daily.  . calcium-vitamin D (OSCAL-500) 500-400 MG-UNIT per tablet Take 1 tablet by mouth 2 (two) times daily.  . Cholecalciferol (VITAMIN D3) 1000 UNITS CAPS Take 2,000 Units by mouth every 30 (thirty) days.   . cyclobenzaprine (FLEXERIL) 10 MG tablet Take 10 mg by mouth at bedtime.   [name] DIAZEPAM PO Take 5 mg by mouth as needed.   . diclofenac sodium (VOLTAREN) 1 % GEL Apply 1 application topically 2 (two) times daily.    Tery Sanfilippo Calcium (STOOL SOFTENER PO) Take 1 tablet by mouth 2 (two) times daily.  . DULoxetine (CYMBALTA) 30 MG capsule Take 60 mg by mouth daily.   . fluticasone (FLONASE) 50 MCG/ACT nasal spray Place into both nostrils  daily.  . furosemide (LASIX) 20 MG tablet Take 1 tablet (20 mg total) by mouth daily as needed.  . magnesium oxide (MAG-OX) 400 MG tablet Take 400 mg by mouth 2 (two) times daily.   . meclizine (ANTIVERT) 25 MG tablet Take 25 mg by mouth as needed for dizziness.  . Multiple Vitamin (MULTIVITAMIN) tablet Take 1 tablet by mouth daily.  [name] oxyCODONE-acetaminophen (PERCOCET) 5-325 MG per tablet Take 1 tablet by mouth every 4 (four) hours as needed.    . pantoprazole (PROTONIX) 40 MG tablet Take by  mouth.  . potassium chloride (K-DUR) 10 MEQ tablet Take 1 tablet (10 mEq total) by mouth daily. Take with lasix  . rivaroxaban (XARELTO) 20 MG TABS tablet Take 20 mg by mouth daily with supper.  [name] rOPINIRole (REQUIP) 2 MG tablet Take 2 mg by mouth at bedtime.   . [DISCONTINUED] furosemide (LASIX) 20 MG tablet Take 20 mg by mouth daily as needed.   . warfarin (COUMADIN) 2 MG tablet Take 2 tablets (4 mg total) by mouth daily.   No facility-administered encounter medications on file as of 06/25/2015.    Past Medical History  Diagnosis Date  . Atrial fibrillation, permanent   . Restless leg syndrome   . Pacemaker -Medtronic     implant for SSS  . 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 Leukocytosis   . Chronic kidney disease, stage I   . CVA (cerebral infarction) 04/28/15    Daughter states she was seen at [facility]    Past Surgical History  Procedure Laterality Date  . Insert / replace / remove pacemaker      implant for SSS  . Mitral valve replacement      s/p mechanical valve replaced with bovine valve 2010  . Melanoma excision      face  . Bladder injection    . Nasal sinus surgery    . Botox bladder      Social History  reports that she quit smoking about 44 years ago. Her smoking use included Cigarettes. She has a 5 pack-year smoking history. She does not have any smokeless tobacco history on file. She reports that she does not drink alcohol or use illicit drugs.  Family History Family history is unknown by patient.   Review of Systems  Constitutional: Negative.   Respiratory: Negative.   Cardiovascular: Negative.   Gastrointestinal: Negative.   Musculoskeletal: Positive for joint swelling and gait problem.  Neurological:       Problems with motor coordination and gait.  Hematological: Negative.   Psychiatric/Behavioral: Negative.   All other systems reviewed and are negative.   BP 120/66 mmHg   Pulse 67  Ht  (1.753 m)  Wt 189 lb (85.73 kg)  BMI 27.90 kg/m2  Physical Exam  Constitutional: She is oriented to person, place, and time. She appears well-developed and well-nourished.  Leg weakness  HENT:  Head: Normocephalic.  Nose: Nose normal.  Mouth/Throat: Oropharynx is clear and moist.  Eyes: Conjunctivae are normal. Pupils are equal, round, and reactive to light.  Neck: Normal range of motion. Neck supple. No JVD present.  Cardiovascular: Normal rate, regular rhythm, S1 normal, S2 normal, normal heart sounds and intact distal pulses.  Exam reveals no gallop and no friction rub.   No murmur heard. Pulmonary/Chest: Effort normal and breath sounds normal. No respiratory distress. She has no wheezes. She has no rales. She  exhibits no tenderness.  Abdominal: Soft. Bowel sounds are normal. She exhibits no distension. There is no tenderness.  Musculoskeletal: Normal range of motion. She exhibits no edema or tenderness.  Lymphadenopathy:    She has no cervical adenopathy.  Neurological: She is alert and oriented to person, place, and time. Coordination normal.  Skin: Skin is warm and dry. No rash noted. No erythema.  Psychiatric: She has a normal mood and affect. Her behavior is normal. Judgment and thought content normal.    Assessment and Plan  Nursing note and vitals reviewed.

## 2015-06-25 NOTE — Assessment & Plan Note (Signed)
Recent stroke 04/28/2015, felt secondary to embolic phenomenon to the right MCA Treated at [facility] with thrombectomy. Still with residual left leg deficits, mild left arm deficits We will change to warfarin She has follow-up with neurology tomorrow

## 2015-06-25 NOTE — Patient Instructions (Signed)
Medication Instructions:  Your physician recommends that you continue on your current medications as directed. Please refer to the Current Medication list given to you today.   Labwork: none  Testing/Procedures: none  Follow-Up: Your physician recommends that you schedule a follow-up appointment in: two months with Dr. Arida.    Any Other Special Instructions Will Be Listed Below (If Applicable).   

## 2015-06-25 NOTE — Assessment & Plan Note (Signed)
Long discussion concerning her anticoagulation. She prefers not to be on Xarelto given the price of $160 per month Husband was also on the same medication in the past and he had strokes. She reports compliance with Eliquis prior to her stroke. Likely little benefit from changing to Xarelto, At her request we'll change back to warfarin. Recommended she hold xarelto in 3 days' time, start warfarin today at 4 mg daily She indicates previously she was taking 3-4 mg of warfarin daily. Goal INR 2.0 up to 3

## 2015-06-25 NOTE — Assessment & Plan Note (Signed)
Blood pressure is well controlled on today's visit. No changes made to the medications. 

## 2015-06-25 NOTE — Assessment & Plan Note (Signed)
Significant gait instability, exacerbated after recent stroke with continued left leg weakness

## 2015-06-25 NOTE — Assessment & Plan Note (Signed)
Currently rate is well controlled. Not taking metoprolol. Will not restart at this time. Blood pressure adequate and in the setting of recent stroke, would like to keep blood pressure higher. We will change from xarelto back to warfarin. Patient is high fall risk, has failed eliquis (Xarelto likely does not provide additional benefit over the former), FFR came be given if needed for bleeding. She does report recent excessive vaginal bleeding

## 2015-06-25 NOTE — Assessment & Plan Note (Signed)
She takes Lasix for leg edema. Since her stroke, swelling worse on the left. Wears compression hose with improvement of her symptoms

## 2015-06-25 NOTE — Assessment & Plan Note (Signed)
Given that the patient had a stroke while she was on Eliquis and given that technically she has probably valvular A. Fib, I discussed with Dr. Mariah Milling the possible need to switch to warfarin.

## 2015-06-25 NOTE — Progress Notes (Signed)
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 History of bilateral knee replacements She was referred by Rhonda Griffith for left leg claudication and possible underlying peripheral arterial disease.  In follow-up, she reports that August 2 she had acute stroke Daughter found her in bed with left side weakness arm and leg, she was taken to [facility], had catheter thrombectomy, discharge 04/30/2015 Participating in PT  In the hospital she was changed from Eliquis to Xarelto.  For her stroke, she started having left calf pain left greater than right with walking. She also noticed that her left foot is colder than the right foot. This has been gradual and not sudden. She underwent an invasive vascular evaluation which showed a normal ABI on the right and mildly reduced on the left at 0.7. Duplex showed possible distal left SFA disease but was not well-visualized. She currently has residual left-sided weakness and has more edema on the left side as well.  Allergies  Allergen Reactions  . Morphine   . Tape     Allergic to plastic/latex tape. Only use paper tape on patient.  . Cephalexin Rash  . Latex Rash  . Tizanidine Palpitations     Current Outpatient Prescriptions on File Prior to Visit  Medication Sig Dispense Refill  . albuterol (PROVENTIL HFA;VENTOLIN HFA) 108 (90 BASE) MCG/ACT inhaler Inhale 2 puffs into the lungs every 6 (six) hours as needed for wheezing.    [name] amLODipine (NORVASC) 10 MG tablet Take 10 mg by mouth daily.    [name] ascorbic acid (VITAMIN C) 500 MG tablet Take 500 mg by mouth daily.    [name] atorvastatin  (LIPITOR) 80 MG tablet Take 80 mg by mouth daily.    . calcium-vitamin D (OSCAL-500) 500-400 MG-UNIT per tablet Take 1 tablet by mouth 2 (two) times daily.    . Cholecalciferol (VITAMIN D3) 1000 UNITS CAPS Take 2,000 Units by mouth every 30 (thirty) days.     . cyclobenzaprine (FLEXERIL) 10 MG tablet Take 10 mg by mouth at bedtime.     [name] DIAZEPAM PO Take 5 mg by mouth as needed.     . diclofenac sodium (VOLTAREN) 1 % GEL Apply 1 application topically 2 (two) times daily.      Tery Sanfilippo Calcium (STOOL SOFTENER PO) Take 1 tablet by mouth 2 (two) times daily.    . DULoxetine (CYMBALTA) 30 MG capsule Take 60 mg by mouth daily.     . fluticasone (FLONASE) 50 MCG/ACT nasal spray Place into both nostrils daily.    . furosemide (LASIX) 20 MG tablet Take 20 mg by mouth daily as needed.     . magnesium oxide (MAG-OX) 400 MG tablet Take 400 mg by mouth 2 (two) times daily.     . Multiple Vitamin (MULTIVITAMIN) tablet Take 1 tablet by mouth daily.    [name] oxyCODONE-acetaminophen (PERCOCET) 5-325 MG per tablet Take 1 tablet by mouth every 4 (four) hours as needed.      . pantoprazole (PROTONIX) 40 MG tablet Take by mouth.    . potassium chloride (K-DUR) 10 MEQ tablet Take 1 tablet (10 mEq total) by mouth daily. Take with lasix 90  tablet 3  . rivaroxaban (XARELTO) 20 MG TABS tablet Take 20 mg by mouth daily with supper.    [name] rOPINIRole (REQUIP) 2 MG tablet Take 2 mg by mouth at bedtime.      No current facility-administered medications on file prior to visit.     Past Medical History  Diagnosis Date  . Atrial fibrillation, permanent   . Restless leg syndrome   . Pacemaker -Medtronic     implant for SSS  . 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 Leukocytosis   . Chronic kidney disease, stage I   . CVA (cerebral infarction) 04/28/15    Daughter states she was seen at [facility]     Past Surgical History  Procedure Laterality Date  .  Insert / replace / remove pacemaker      implant for SSS  . Mitral valve replacement      s/p mechanical valve replaced with bovine valve 2010  . Melanoma excision      face  . Bladder injection    . Nasal sinus surgery    . Botox bladder       Family History  Problem Relation Age of Onset  . Family history unknown: Yes     Social History   Social History  . Marital Status: Married    Spouse Name: N/A  . Number of Children: N/A  . Years of Education: N/A   Occupational History  . Not on file.   Social History Main Topics  . Smoking status: Former Smoker -- 1.00 packs/day for 5 years    Types: Cigarettes    Quit date: 12/29/1970  . Smokeless tobacco: Not on file  . Alcohol Use: No  . Drug Use: No  . Sexual Activity: Not on file   Other Topics Concern  . Not on file   Social History Narrative   Only use paper tape on patient.     ROS A 10 point review of system was performed. It is negative other than that mentioned in the history of present illness.   PHYSICAL EXAM   BP 120/60 mmHg  Pulse 64  Ht  (1.753 m)  Wt 189 lb (85.73 kg)  BMI 27.90 kg/m2 Constitutional: She is oriented to person, place, and time. She appears well-developed and well-nourished. No distress.  HENT: No nasal discharge.  Head: Normocephalic and atraumatic.  Eyes: Pupils are equal and round. No discharge.  Neck: Normal range of motion. Neck supple. No JVD present. No thyromegaly present.  Cardiovascular: Normal rate, irregular rhythm, normal heart sounds. Exam reveals no gallop and no friction rub. No murmur heard.  Pulmonary/Chest: Effort normal and breath sounds normal. No stridor. No respiratory distress. She has no wheezes. She has no rales. She exhibits no tenderness.  Abdominal: Soft. Bowel sounds are normal. She exhibits no distension. There is no tenderness. There is no rebound and no guarding.  Musculoskeletal: Normal range of motion. She exhibits no edema and no  tenderness.  Neurological: She is alert and oriented to person, place, and time. Coordination normal.  Skin: Skin is warm and dry. No rash noted. She is not diaphoretic. No erythema. No pallor.  Psychiatric: She has a normal mood and affect. Her behavior is normal. Judgment and thought content normal.  Vascular: Femoral pulses are palpable. Posterior tibial is normal on the right and absent on the left. Dorsalis pedis is normal on the right  and faint on the left.     ASSESSMENT AND PLAN

## 2015-07-01 ENCOUNTER — Ambulatory Visit (INDEPENDENT_AMBULATORY_CARE_PROVIDER_SITE_OTHER): Payer: Medicare Other

## 2015-07-01 DIAGNOSIS — Z5181 Encounter for therapeutic drug level monitoring: Secondary | ICD-10-CM

## 2015-07-01 DIAGNOSIS — I482 Chronic atrial fibrillation, unspecified: Secondary | ICD-10-CM

## 2015-07-01 LAB — POCT INR: INR: 1.1

## 2015-07-01 NOTE — Patient Instructions (Signed)

## 2015-07-03 NOTE — Progress Notes (Signed)
Rhonda Griffith      Patient Care Team: [name], MD as PCP - General (Family Medicine)   HPI  Rhonda Griffith is a 69 y.o. female Former patient of Dr. Leonia Reeves who underwent pacemaker implantation for symptomatic bradycardia in the context of atrial fibrillation.  She also has a history of mitral   valve disease status post  Bioprosthetic mitral valve replacement. Last echocardiogram 9/12 demonstrated normal LV function normal valvular function mild-moderate left atrial dilatation  Intercurrent hx notable for stroke and was changed from apixoban to Rivaroxaban at [facility] notes reviewed they wanted to try something diffferent  she subsequently came back to see Dr. Knute Neu who elected to put her on Coumadin with a target INR of 2.0--3.0.  She struggles with dyspnea on exertion. Device interrogation demonstrates rapid rates.  8/16 >>Echo EF 55%   Past Medical History  Diagnosis Date  . Atrial fibrillation, permanent (HCC)   . Restless leg syndrome   . Pacemaker -Medtronic     implant for SSS  . 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 Leukocytosis   . Chronic kidney disease, stage I   . CVA (cerebral infarction) 04/28/15    Daughter states she was seen at [facility]    Past Surgical History  Procedure Laterality Date  . Insert / replace / remove pacemaker      implant for SSS  . Mitral valve replacement      s/p mechanical valve replaced with bovine valve 2010  . Melanoma excision      face  . Bladder injection    . Nasal sinus surgery    . Botox bladder      Current Outpatient Prescriptions  Medication Sig Dispense Refill  . albuterol (PROVENTIL HFA;VENTOLIN HFA) 108 (90 BASE) MCG/ACT inhaler Inhale 2 puffs into the lungs every 6 (six) hours as needed for wheezing.    [name] amLODipine (NORVASC) 10 MG tablet Take 10 mg by mouth daily.    [name] ascorbic acid (VITAMIN C) 500 MG tablet Take 500 mg by mouth  daily.    [name] atorvastatin (LIPITOR) 80 MG tablet Take 80 mg by mouth daily.    . calcium-vitamin D (OSCAL-500) 500-400 MG-UNIT per tablet Take 1 tablet by mouth 2 (two) times daily.    . Cholecalciferol (VITAMIN D3) 1000 UNITS CAPS Take 2,000 Units by mouth every 30 (thirty) days.     . cyclobenzaprine (FLEXERIL) 10 MG tablet Take 10 mg by mouth at bedtime.     [name] DIAZEPAM PO Take 5 mg by mouth as needed.     . diclofenac sodium (VOLTAREN) 1 % GEL Apply 1 application topically 2 (two) times daily.      Tery Sanfilippo Calcium (STOOL SOFTENER PO) Take 1 tablet by mouth 2 (two) times daily.    . DULoxetine (CYMBALTA) 30 MG capsule Take 60 mg by mouth daily.     . fluticasone (FLONASE) 50 MCG/ACT nasal spray Place into both nostrils daily.    . furosemide (LASIX) 20 MG tablet Take 1 tablet (20 mg total) by mouth daily as needed. 30 tablet 11  . magnesium oxide (MAG-OX) 400 MG tablet Take 400 mg by mouth 2 (two) times daily.     . meclizine (ANTIVERT) 25 MG tablet Take 25 mg by mouth as needed for dizziness.    . Multiple Vitamin (MULTIVITAMIN) tablet Take 1 tablet by mouth daily.    [name]  oxyCODONE-acetaminophen (PERCOCET) 5-325 MG per tablet Take 1 tablet by mouth every 4 (four) hours as needed.      . pantoprazole (PROTONIX) 40 MG tablet Take by mouth.    . potassium chloride (K-DUR) 10 MEQ tablet Take 1 tablet (10 mEq total) by mouth daily. Take with lasix 90 tablet 3  . rOPINIRole (REQUIP) 2 MG tablet Take 2 mg by mouth at bedtime.     [name] warfarin (COUMADIN) 2 MG tablet Take 2 tablets (4 mg total) by mouth daily. 120 tablet 6   No current facility-administered medications for this visit.    Allergies  Allergen Reactions  . Morphine   . Tape     Allergic to plastic/latex tape. Only use paper tape on patient.  . Cephalexin Rash  . Latex Rash  . Tizanidine Palpitations    Review of Systems negative except from HPI and PMH  Physical Exam BP 153/70 mmHg  Pulse 74  Ht  (1.753 m)  Wt 188 lb  8 oz (85.503 kg)  BMI 27.82 kg/m2 Well developed and nourished in no acute distress HENT normal Neck supple with JVP-flat Clear Irrgelary Regular rate and rhythm, no murmurs or gallops Abd-soft with active BS No Clubbing cyanosis edema Skin-warm and dry A & Oriented  Grossly normal sensory and motor function  ECG demonstrates atrial fibrillation with intermittent conduction -/09/40   Assessment and  Plan  Atrial fibrillation -permanent  Bradycardia  Pacemaker-Medtronic  History of mitral valve replacement, mechanical followed by bioprosthetic most recently 2010  Stroke  Hypertension catheter 95 he is reasonably time on a schedule no cyanosis  Dyspnea on exertion    She has dyspnea on exertion with device interrogation demonstrated 16% of her heart beats faster than 100 bpm. we will discontinue the amlodipine and begin her on diltiazem. The old medical records demonstrated that she had been on doses of 1 80-3 00. We'll begin her on 180 and reassess in 6-weeks for blood pressure as well as heart rate excursion.

## 2015-07-07 ENCOUNTER — Ambulatory Visit (INDEPENDENT_AMBULATORY_CARE_PROVIDER_SITE_OTHER): Payer: Medicare Other

## 2015-07-07 ENCOUNTER — Ambulatory Visit (INDEPENDENT_AMBULATORY_CARE_PROVIDER_SITE_OTHER): Payer: Medicare Other | Admitting: Internal Medicine

## 2015-07-07 ENCOUNTER — Encounter: Payer: Self-pay | Admitting: Internal Medicine

## 2015-07-07 VITALS — BP 153/70 | HR 74 | Ht 69.0 in | Wt 188.5 lb

## 2015-07-07 DIAGNOSIS — I639 Cerebral infarction, unspecified: Secondary | ICD-10-CM | POA: Diagnosis not present

## 2015-07-07 DIAGNOSIS — I482 Chronic atrial fibrillation, unspecified: Secondary | ICD-10-CM

## 2015-07-07 DIAGNOSIS — I495 Sick sinus syndrome: Secondary | ICD-10-CM | POA: Diagnosis not present

## 2015-07-07 DIAGNOSIS — Z950 Presence of cardiac pacemaker: Secondary | ICD-10-CM

## 2015-07-07 LAB — POCT INR: INR: 3.6

## 2015-07-07 NOTE — Patient Instructions (Signed)
Medication Instructions: 1) Stop norvasc (amlodipine) 2) Re-start Cardiazem (diltiazem) 180 mg once daily  Labwork: - none  Procedures/Testing: - none  Follow-Up: - Your physician recommends that you schedule a follow-up appointment in: 2 months with Dr. Graciela Husbands.  Any Additional Special Instructions Will Be Listed Below (If Applicable). - none

## 2015-07-08 ENCOUNTER — Encounter: Payer: Self-pay | Admitting: Obstetrics and Gynecology

## 2015-07-08 ENCOUNTER — Encounter: Payer: Medicare Other | Admitting: Obstetrics and Gynecology

## 2015-07-08 ENCOUNTER — Telehealth: Payer: Self-pay | Admitting: Obstetrics and Gynecology

## 2015-07-08 ENCOUNTER — Ambulatory Visit: Payer: Medicare Other

## 2015-07-08 DIAGNOSIS — N950 Postmenopausal bleeding: Secondary | ICD-10-CM

## 2015-07-08 LAB — CUP PACEART INCLINIC DEVICE CHECK
Battery Impedance: 3108 Ohm
Battery Voltage: 2.74 V
Implantable Lead Model: 5076
Lead Channel Impedance Value: 0 Ohm
Lead Channel Pacing Threshold Pulse Width: 0.4 ms
Lead Channel Setting Pacing Amplitude: 2 V
MDC IDC LEAD IMPLANT DT: 20090317
MDC IDC LEAD LOCATION: 753860
MDC IDC MSMT BATTERY REMAINING LONGEVITY: 24 mo
MDC IDC MSMT LEADCHNL RV IMPEDANCE VALUE: 472 Ohm
MDC IDC MSMT LEADCHNL RV PACING THRESHOLD AMPLITUDE: 0.5 V
MDC IDC MSMT LEADCHNL RV PACING THRESHOLD AMPLITUDE: 0.5 V
MDC IDC MSMT LEADCHNL RV PACING THRESHOLD PULSEWIDTH: 0.4 ms
MDC IDC MSMT LEADCHNL RV SENSING INTR AMPL: 11.2 mV
MDC IDC SESS DTM: 20161011154604
MDC IDC SET LEADCHNL RV PACING PULSEWIDTH: 0.4 ms
MDC IDC SET LEADCHNL RV SENSING SENSITIVITY: 4 mV
MDC IDC SET ZONE DETECTION INTERVAL: 333.33 ms
MDC IDC STAT BRADY RV PERCENT PACED: 43 %

## 2015-07-08 NOTE — Telephone Encounter (Signed)
Patient had appointment scheduled with MD to review ultrasound results, however was cancelled due to MD being called away for delivery.  Contacted patient to discuss ultrasound results.  Patient with h/o PMB, also on anti-coagulant therapy.  Ultrasound notes thin endometrial stripe (1.8 mm).  No concern for malignancy.  Bleeding could have been due to to vaginal atrophy or anticoagulant use.  Has not had any further episodes of bleeding since last visit. Patient to f/u as needed.  If another episode occurs, can consider use of vaginal lubricants or local estrogen therapy (if no contraindications).  Patient notes understanding.    Hildred LaserAnika Shelda Truby, MD [facility]

## 2015-07-20 NOTE — Progress Notes (Signed)
This encounter was created in error - please disregard.  This encounter was created in error - please disregard.

## 2015-07-21 ENCOUNTER — Encounter: Payer: Self-pay | Admitting: Internal Medicine

## 2015-07-22 ENCOUNTER — Ambulatory Visit (INDEPENDENT_AMBULATORY_CARE_PROVIDER_SITE_OTHER): Payer: Medicare Other

## 2015-07-22 DIAGNOSIS — I482 Chronic atrial fibrillation, unspecified: Secondary | ICD-10-CM

## 2015-07-22 LAB — POCT INR: INR: 3.3

## 2015-07-29 ENCOUNTER — Ambulatory Visit (INDEPENDENT_AMBULATORY_CARE_PROVIDER_SITE_OTHER): Payer: Medicare Other

## 2015-07-29 DIAGNOSIS — I482 Chronic atrial fibrillation, unspecified: Secondary | ICD-10-CM

## 2015-07-29 LAB — POCT INR: INR: 1.3

## 2015-08-10 ENCOUNTER — Ambulatory Visit (INDEPENDENT_AMBULATORY_CARE_PROVIDER_SITE_OTHER): Payer: Medicare Other

## 2015-08-10 DIAGNOSIS — I4891 Unspecified atrial fibrillation: Secondary | ICD-10-CM | POA: Diagnosis not present

## 2015-08-10 DIAGNOSIS — I482 Chronic atrial fibrillation, unspecified: Secondary | ICD-10-CM

## 2015-08-10 LAB — POCT INR: INR: 1.5

## 2015-08-14 NOTE — Telephone Encounter (Signed)
This encounter was created in error - please disregard.

## 2015-08-17 ENCOUNTER — Telehealth: Payer: Self-pay

## 2015-08-17 NOTE — Telephone Encounter (Signed)
Pt needs to have this looked at.   Recommend she keep appt w/ Alycia Rossettiyan tomorrow.  I've put her on my list to call if anyone cancels today.

## 2015-08-17 NOTE — Telephone Encounter (Signed)
Pt called, states her left leg is still swollen. States she has taken her Lasix and has kept it elevated, and no change since last week. I did schedule her to see Eula Listenyan Dunn, PA  tomorrow at 2:30

## 2015-08-18 ENCOUNTER — Ambulatory Visit (INDEPENDENT_AMBULATORY_CARE_PROVIDER_SITE_OTHER): Payer: Medicare Other | Admitting: Nurse Practitioner

## 2015-08-18 ENCOUNTER — Ambulatory Visit (INDEPENDENT_AMBULATORY_CARE_PROVIDER_SITE_OTHER): Payer: Medicare Other

## 2015-08-18 ENCOUNTER — Encounter: Payer: Self-pay | Admitting: Nurse Practitioner

## 2015-08-18 ENCOUNTER — Ambulatory Visit (INDEPENDENT_AMBULATORY_CARE_PROVIDER_SITE_OTHER): Payer: Medicare Other | Admitting: Pharmacist

## 2015-08-18 VITALS — BP 125/69 | HR 66 | Ht 69.0 in | Wt 186.0 lb

## 2015-08-18 DIAGNOSIS — I4891 Unspecified atrial fibrillation: Secondary | ICD-10-CM

## 2015-08-18 DIAGNOSIS — I482 Chronic atrial fibrillation, unspecified: Secondary | ICD-10-CM

## 2015-08-18 DIAGNOSIS — I4821 Permanent atrial fibrillation: Secondary | ICD-10-CM | POA: Insufficient documentation

## 2015-08-18 DIAGNOSIS — M7989 Other specified soft tissue disorders: Secondary | ICD-10-CM

## 2015-08-18 DIAGNOSIS — Z950 Presence of cardiac pacemaker: Secondary | ICD-10-CM | POA: Insufficient documentation

## 2015-08-18 DIAGNOSIS — N189 Chronic kidney disease, unspecified: Secondary | ICD-10-CM | POA: Insufficient documentation

## 2015-08-18 DIAGNOSIS — I639 Cerebral infarction, unspecified: Secondary | ICD-10-CM

## 2015-08-18 DIAGNOSIS — I739 Peripheral vascular disease, unspecified: Secondary | ICD-10-CM

## 2015-08-18 DIAGNOSIS — I080 Rheumatic disorders of both mitral and aortic valves: Secondary | ICD-10-CM | POA: Insufficient documentation

## 2015-08-18 LAB — POCT INR: INR: 2.8

## 2015-08-18 MED ORDER — FUROSEMIDE 40 MG PO TABS: 40.0000 mg | ORAL_TABLET | Freq: Every day | ORAL | Status: DC | PRN

## 2015-08-18 NOTE — Progress Notes (Signed)
Patient Name: Rhonda Griffith Date of Encounter: 08/18/2015  Primary Care Provider:  [name], Todd [place], MD Primary Cardiologist:  Concha Se. Gollan, MD / Judie PetitM. Kirke CorinArida, MD (PV) / S. Graciela HusbandsKlein, MD (EP)  Chief Complaint  69 year old female with a history of permanent atrial fibrillation as well as sick sinus syndrome status post pacemaker placement who presents for follow-up related to swelling in her left ankle.  Past Medical History   Past Medical History  Diagnosis Date  . Atrial fibrillation, permanent (HCC)     a. CHA2DS2VASc = 4-->coumadin;  b. 09/2014 Echo: EF 55-60%, normal wall motion, mild AI/AS, nl MV, mod dil LA, mildly dil RA, mild to mod TR, PASP 47mmHg.  [name]. Restless leg syndrome   . Pacemaker -Medtronic     a. implant for SSS  . 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 s/p mechanical valve replaced with bovine valve 2010.  [name]. Leukocytosis   . Chronic kidney disease, stage I   . CVA (cerebral infarction)     a. 04/2015 - anticoagulation switched from eliquis to xarelto to coumadin.  [name]. PAD (peripheral artery disease) (HCC)     a. w/ left lower ext claudication s/p ABI's 04/2015 showing nl ABI on Right with abnl waveforms on left sugg of L SFA dzs.   Past Surgical History  Procedure Laterality Date  . Insert / replace / remove pacemaker      implant for SSS  . Mitral valve replacement      s/p mechanical valve replaced with bovine valve 2010  . Melanoma excision      face  . Bladder injection    . Nasal sinus surgery    . Botox bladder      Allergies  Allergies  Allergen Reactions  . Morphine   . Tape     Allergic to plastic/latex tape. Only use paper tape on patient.  . Cephalexin Rash  . Latex Rash  . Tizanidine Palpitations    HPI  69 year old female with the above complex problem list. She was last seen by Dr. Graciela HusbandsKlein on October 11 with finding of elevated heart rates by pacemaker interrogation. Amlodipine was discontinued in favor of  diltiazem. Since then, patient has noted improved heart rates at home. She does have some degree of chronic stable dyspnea on exertion. Overall, she is not very active and ever since her stroke has been basically sedentary with some degree of ongoing left-sided weakness. Over the past 1-2 months, her daughter has noticed increasing left lower extremity swelling. There is not redness or tenderness. Patient does not have any chest pain, PND, orthopnea, dizziness, syncope, or early satiety. She does have chronic left calf claudication but again hasn't been walking very much. She has been seen by Dr. Kirke CorinArida related to abnormal left-sided ABIs and does have follow-up with him next week.  Home Medications  Prior to Admission medications   Medication Sig Start Date End Date Taking? Authorizing Provider  albuterol (PROVENTIL HFA;VENTOLIN HFA) 108 (90 BASE) MCG/ACT inhaler Inhale 2 puffs into the lungs every 6 (six) hours as needed for wheezing.   Yes Historical Provider, MD  ascorbic acid (VITAMIN C) 500 MG tablet Take 500 mg by mouth daily.   Yes Historical Provider, MD  atorvastatin (LIPITOR) 80 MG tablet Take 80 mg by mouth daily.   Yes Historical Provider, MD  calcium-vitamin D (OSCAL-500) 500-400 MG-UNIT per tablet Take 1 tablet by mouth 2 (two) times daily.   Yes Historical  Provider, MD  Cholecalciferol (VITAMIN D3) 1000 UNITS CAPS Take 2,000 Units by mouth every 30 (thirty) days.    Yes Historical Provider, MD  cyclobenzaprine (FLEXERIL) 10 MG tablet Take 10 mg by mouth at bedtime.    Yes Historical Provider, MD  DIAZEPAM PO Take 5 mg by mouth as needed.    Yes Historical Provider, MD  diclofenac sodium (VOLTAREN) 1 % GEL Apply 1 application topically 2 (two) times daily.     Yes Historical Provider, MD  diltiazem (CARDIZEM CD) 180 MG 24 hr capsule Take 1 capsule (180 mg total) by mouth daily. 07/07/15  Yes Duke Salvia, MD  Docusate Calcium (STOOL SOFTENER PO) Take 1 tablet by mouth 2 (two) times  daily.   Yes Historical Provider, MD  DULoxetine (CYMBALTA) 30 MG capsule Take 60 mg by mouth daily.    Yes Historical Provider, MD  fluticasone (FLONASE) 50 MCG/ACT nasal spray Place into both nostrils daily.   Yes Historical Provider, MD  furosemide (LASIX) 40 MG tablet Take 1 tablet (40 mg total) by mouth daily as needed. 08/18/15  Yes Ok Anis, NP  magnesium oxide (MAG-OX) 400 MG tablet Take 400 mg by mouth 2 (two) times daily.    Yes Historical Provider, MD  meclizine (ANTIVERT) 25 MG tablet Take 25 mg by mouth as needed for dizziness.   Yes Historical Provider, MD  Multiple Vitamin (MULTIVITAMIN) tablet Take 1 tablet by mouth daily.   Yes Historical Provider, MD  oxyCODONE-acetaminophen (PERCOCET) 5-325 MG per tablet Take 1 tablet by mouth every 4 (four) hours as needed.     Yes Historical Provider, MD  pantoprazole (PROTONIX) 40 MG tablet Take by mouth. 05/28/15  Yes Historical Provider, MD  potassium chloride (K-DUR) 10 MEQ tablet Take 1 tablet (10 mEq total) by mouth daily. Take with lasix 10/24/14  Yes Antonieta Iba, MD  rOPINIRole (REQUIP) 2 MG tablet Take 2 mg by mouth at bedtime.  05/29/15  Yes Historical Provider, MD  warfarin (COUMADIN) 2 MG tablet Take 2 tablets (4 mg total) by mouth daily. 06/25/15  Yes Antonieta Iba, MD    Review of Systems  As above, she is been having increased left lower extremity swelling. She has chronic pain and debility. She also has left lower external claudication and some degree of left-sided weakness which limits her activity.  All other systems reviewed and are otherwise negative except as noted above.  Physical Exam  VS:  BP 125/69 mmHg  Pulse 66  Ht  (1.753 m)  Wt 186 lb (84.369 kg)  BMI 27.45 kg/m2  SpO2 96% , BMI Body mass index is 27.45 kg/(m^2). GEN: Well nourished, well developed, in no acute distress. HEENT: normal. Neck: Supple, no JVD, carotid bruits, or masses. Cardiac: Irregularly irregular with a 2/6 systolic  murmur at the left upper sternal border,, no rubs, or gallops. No clubbing, cyanosis, edema.  Radials/DP/PT 2+ and equal bilaterally.  Respiratory:  Respirations regular and unlabored, clear to auscultation bilaterally. GI: Soft, nontender, nondistended, BS + x 4. MS: no deformity or atrophy. Skin: warm and dry, no rash. Neuro:  Strength and sensation are intact. Psych: Normal affect.  Assessment & Plan  1.  Left lower extremity swelling: Patient is a 1-2 month history of left lower extremity swelling. She thinks it's been coming on in the past month while her daughter says it's been much longer. Reviewed her most recent INRs, which have been subtherapeutic. There is no tenderness, erythema, or skin  breakdown. No palpable cord. I have sent her for a left looks from the ultrasound here in the office and this was negative for DVT or Baker's cyst. Of note, she was recently switched from amlodipine to diltiazem. She does not have any right lower extremity edema and she says she tolerated diltiazem in the past without swelling. I suspect then that her swelling may be predominantly related to her prior stroke and dependent positioning of the left side of her body throughout the day. I advised that she can take Lasix 40 mg daily for the next 3 days to help with the swelling but that she should would also be well served by using a compression stocking and also keeping that leg elevated when not standing. Following 3 days of Lasix therapy, she should drop back to when necessary usage.  2. Persistent fibrillation: Rate is much better controlled since initiating diltiazem therapy. She is anticoagulated with warfarin and has INR follow-up scheduled for today.   3. Mitral valve disease: Status post initially mechanical and then bioprosthetic valve replacement.  This was stable on most recent echo in January 2016.  4. Peripheral arterial disease: She does have chronic left lower extremity claudication with  abnormal ABI on the left in August. She has follow-up with Dr. Kirke Corin next week.  5. Disposition: She has follow-up with Dr. Kirke Corin next week for LLE claudication issues.  Nicolasa Ducking, NP 08/18/2015, 6:02 PM

## 2015-08-18 NOTE — Patient Instructions (Signed)
Medication Instructions:  Your physician has recommended you make the following change in your medication:  INCREASE Lasix to 40mg  daily for 3 days. Then take Lasix 40mg  daily as needed for swelling   Labwork: None ordered  Testing/Procedures: Your physician has requested that you have a lower extremity venous duplex. During this test,ultrasound are used to evaluate venous blood flow in the legs. Allow one hour for this exam. There are no restrictions or special instructions. (To be performed today @ 4:15pm)   Follow-Up: Follow up as planned with Dr.Arida on 08/24/15  Any Other Special Instructions Will Be Listed Below (If Applicable).     If you need a refill on your cardiac medications before your next appointment, please call your pharmacy.

## 2015-08-24 ENCOUNTER — Ambulatory Visit (INDEPENDENT_AMBULATORY_CARE_PROVIDER_SITE_OTHER): Payer: Medicare Other | Admitting: Cardiovascular Disease

## 2015-08-24 ENCOUNTER — Encounter: Payer: Self-pay | Admitting: Cardiovascular Disease

## 2015-08-24 VITALS — BP 130/68 | HR 72 | Ht 69.0 in | Wt 186.0 lb

## 2015-08-24 DIAGNOSIS — I482 Chronic atrial fibrillation: Secondary | ICD-10-CM

## 2015-08-24 DIAGNOSIS — I639 Cerebral infarction, unspecified: Secondary | ICD-10-CM

## 2015-08-24 DIAGNOSIS — I4821 Permanent atrial fibrillation: Secondary | ICD-10-CM

## 2015-08-24 DIAGNOSIS — I5032 Chronic diastolic (congestive) heart failure: Secondary | ICD-10-CM | POA: Diagnosis not present

## 2015-08-24 DIAGNOSIS — I739 Peripheral vascular disease, unspecified: Secondary | ICD-10-CM

## 2015-08-24 DIAGNOSIS — I10 Essential (primary) hypertension: Secondary | ICD-10-CM | POA: Diagnosis not present

## 2015-08-24 MED ORDER — FUROSEMIDE 20 MG PO TABS: 20.0000 mg | ORAL_TABLET | Freq: Every day | ORAL | Status: DC

## 2015-08-24 NOTE — Assessment & Plan Note (Signed)
She had improvement with left leg edema with Lasix. Thus, I advised her to continue with smaller dose of 20 mg once daily. She does have underlying chronic kidney disease.

## 2015-08-24 NOTE — Progress Notes (Signed)
HPI  Rhonda Griffith is a pleasant 69 year old woman with a history of chronic atrial fibrillation, pacemaker, history of rheumatic valve disease with mitral valve replacement with bovine valve, history of falls with a very large hematoma on her left leg earlier in 2012, history of GI bleeding, left arm fracture, history of sick sinus syndrome for which a pacemaker was placed who is here today for a follow-up visit regarding peripheral arterial disease. She had a stroke affecting left side in August of this year.  She was seen for left calf claudication which started before her stroke. She underwent an invasive vascular evaluation which showed a normal ABI on the right and mildly reduced on the left at 0.7. Duplex showed possible distal left SFA disease but was not well-visualized. She was treated conservatively. She continues to have left calf pain with walking. However, she is still dealing with left-sided weakness related to her stroke with poor balance. She is wearing a brace for foot drop. She is mostly bothered by left leg swelling and she saw Thayer Ohm recently. She underwent lower extremity venous duplex which showed no evidence of DVT. She took 40 mg of Lasix for 3 days with some improvement. The left leg edema is chronic since she had knee replacement. She also complains of continued shortness of breath.  Allergies  Allergen Reactions  . Morphine   . Tape     Allergic to plastic/latex tape. Only use paper tape on patient.  . Cephalexin Rash  . Latex Rash  . Tizanidine Palpitations     Current Outpatient Prescriptions on File Prior to Visit  Medication Sig Dispense Refill  . albuterol (PROVENTIL HFA;VENTOLIN HFA) 108 (90 BASE) MCG/ACT inhaler Inhale 2 puffs into the lungs every 6 (six) hours as needed for wheezing.    [name] ascorbic acid (VITAMIN C) 500 MG tablet Take 500 mg by mouth daily.    [name] atorvastatin (LIPITOR) 80 MG tablet Take 80 mg by mouth daily.    . calcium-vitamin D  (OSCAL-500) 500-400 MG-UNIT per tablet Take 1 tablet by mouth 2 (two) times daily.    . Cholecalciferol (VITAMIN D3) 1000 UNITS CAPS Take 2,000 Units by mouth every 30 (thirty) days.     . cyclobenzaprine (FLEXERIL) 10 MG tablet Take 10 mg by mouth at bedtime.     [name] DIAZEPAM PO Take 5 mg by mouth as needed.     . diclofenac sodium (VOLTAREN) 1 % GEL Apply 1 application topically 2 (two) times daily.      [name] diltiazem (CARDIZEM CD) 180 MG 24 hr capsule Take 1 capsule (180 mg total) by mouth daily.    Tery Sanfilippo Calcium (STOOL SOFTENER PO) Take 1 tablet by mouth 2 (two) times daily.    . DULoxetine (CYMBALTA) 30 MG capsule Take 60 mg by mouth daily.     . fluticasone (FLONASE) 50 MCG/ACT nasal spray Place into both nostrils daily.    . furosemide (LASIX) 40 MG tablet Take 1 tablet (40 mg total) by mouth daily as needed. 30 tablet 2  . magnesium oxide (MAG-OX) 400 MG tablet Take 400 mg by mouth 2 (two) times daily.     . meclizine (ANTIVERT) 25 MG tablet Take 25 mg by mouth as needed for dizziness.    . Multiple Vitamin (MULTIVITAMIN) tablet Take 1 tablet by mouth daily.    [name] oxyCODONE-acetaminophen (PERCOCET) 5-325 MG per tablet Take 1 tablet by mouth every 4 (four) hours as needed.      . pantoprazole (  PROTONIX) 40 MG tablet Take by mouth.    . potassium chloride (K-DUR) 10 MEQ tablet Take 1 tablet (10 mEq total) by mouth daily. Take with lasix 90 tablet 3  . rOPINIRole (REQUIP) 2 MG tablet Take 2 mg by mouth at bedtime.     [name]. warfarin (COUMADIN) 2 MG tablet Take 2 tablets (4 mg total) by mouth daily. 120 tablet 6   No current facility-administered medications on file prior to visit.     Past Medical History  Diagnosis Date  . Atrial fibrillation, permanent (HCC)     a. CHA2DS2VASc = 4-->coumadin;  b. 09/2014 Echo: EF 55-60%, normal wall motion, mild AI/AS, nl MV, mod dil LA, mildly dil RA, mild to mod TR, PASP 47mmHg.  [name]. Restless leg syndrome   . Pacemaker -Medtronic     a. implant for SSS   . 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 s/p mechanical valve replaced with bovine valve 2010.  [name]. Leukocytosis   . Chronic kidney disease, stage I   . CVA (cerebral infarction)     a. 04/2015 - anticoagulation switched from eliquis to xarelto to coumadin.  [name]. PAD (peripheral artery disease) (HCC)     a. w/ left lower ext claudication s/p ABI's 04/2015 showing nl ABI on Right with abnl waveforms on left sugg of L SFA dzs.     Past Surgical History  Procedure Laterality Date  . Insert / replace / remove pacemaker      implant for SSS  . Mitral valve replacement      s/p mechanical valve replaced with bovine valve 2010  . Melanoma excision      face  . Bladder injection    . Nasal sinus surgery    . Botox bladder       Family History  Problem Relation Age of Onset  . Family history unknown: Yes     Social History   Social History  . Marital Status: Married    Spouse Name: N/A  . Number of Children: N/A  . Years of Education: N/A   Occupational History  . Not on file.   Social History Main Topics  . Smoking status: Former Smoker -- 1.00 packs/day for 5 years    Types: Cigarettes    Quit date: 12/29/1970  . Smokeless tobacco: Not on file  . Alcohol Use: No  . Drug Use: No  . Sexual Activity: Not on file   Other Topics Concern  . Not on file   Social History Narrative   Only use paper tape on patient.     ROS A 10 point review of system was performed. It is negative other than that mentioned in the history of present illness.   PHYSICAL EXAM   BP 130/68 mmHg  Pulse 72  Ht 5\' 9"  (1.753 m)  Wt 186 lb (84.369 kg)  BMI 27.45 kg/m2 Constitutional: She is oriented to person, place, and time. She appears well-developed and well-nourished. No distress.  HENT: No nasal discharge.  Head: Normocephalic and atraumatic.  Eyes: Pupils are equal and round. No discharge.  Neck: Normal range of motion. Neck supple. No JVD present. No thyromegaly  present.  Cardiovascular: Normal rate, irregular rhythm, normal heart sounds. Exam reveals no gallop and no friction rub. No murmur heard.  Pulmonary/Chest: Effort normal and breath sounds normal. No stridor. No respiratory distress. She has no wheezes. She has no rales. She exhibits no tenderness.  Abdominal: Soft. Bowel sounds  are normal. She exhibits no distension. There is no tenderness. There is no rebound and no guarding.  Musculoskeletal: Normal range of motion. She exhibits no edema and no tenderness.  Neurological: She is alert and oriented to person, place, and time. Coordination normal.  Skin: Skin is warm and dry. No rash noted. She is not diaphoretic. No erythema. No pallor.  Psychiatric: She has a normal mood and affect. Her behavior is normal. Judgment and thought content normal.  Vascular: Femoral pulses are palpable. Posterior tibial is normal on the right and absent on the left. Dorsalis pedis is normal on the right and faint on the left.     ASSESSMENT AND PLAN

## 2015-08-24 NOTE — Assessment & Plan Note (Signed)
Blood pressure is reasonably controlled. 

## 2015-08-24 NOTE — Assessment & Plan Note (Signed)
She does have moderate left leg claudication. Nonetheless, her mobility is somewhat limited due to stroke this year. She seems to be also mostly bothered by left leg swelling which seems to be due to chronic venous insufficiency. Thus, revascularization for PAD, he might not have a huge impact on overall functional capacity and symptom improvement. Thus, I recommend continued medical therapy for now and follow-up in 6 months.

## 2015-08-24 NOTE — Patient Instructions (Signed)
Medication Instructions:  Your physician has recommended you make the following change in your medication:  START taking lasix 20mg  once per day   Labwork: none  Testing/Procedures: none  Follow-Up: Your physician wants you to follow-up in: six months with Dr. Kirke CorinArida.  You will receive a reminder letter in the mail two months in advance. If you don't receive a letter, please call our office to schedule the follow-up appointment.   Any Other Special Instructions Will Be Listed Below (If Applicable).     If you need a refill on your cardiac medications before your next appointment, please call your pharmacy.

## 2015-08-24 NOTE — Assessment & Plan Note (Signed)
She is tolerating anticoagulation with warfarin.

## 2015-08-27 ENCOUNTER — Other Ambulatory Visit (HOSPITAL_COMMUNITY): Payer: Self-pay | Admitting: Emergency Medicine

## 2015-08-27 ENCOUNTER — Telehealth: Payer: Self-pay

## 2015-08-27 ENCOUNTER — Telehealth: Payer: Self-pay | Admitting: Internal Medicine

## 2015-08-27 NOTE — Telephone Encounter (Signed)
Pt states he left leg is so swollen she cannot walk on it. States it gets worse every day. Please call.

## 2015-08-27 NOTE — Telephone Encounter (Signed)
Spoke w/ pt.  Advised that Dr. Mariah MillingGollan reviewed her chart and recommends that she wrap her leg and get that fluid back into her blood stream so she can pee it out.  She states that she wears her TED hose, but they are not very tight.  She reports that she usually wets the bed at night, but has not done so the past couple of nights.  Advised to wrap leg w/ ACE bandage.  She is agreeable to this and will call back if sx do not improve.

## 2015-08-27 NOTE — Telephone Encounter (Signed)
[name] called at 8:30 PM 11/30 with swollen foot. This has been worked out as outpatient. Treated with lasix and neurological workup and previous DVT evaluation (negative). She says it is worse despite lasix. She denies infection or redness and actually has neurology (?EMG) evaluation tomorrow, 12/1. She also continues to have some dyspnea - stable. She feels stable, no warning symptoms. We discussed that if she has any worsening symptoms she should call 911; however, it seems like she has ongoing workup of her foot and she should make her appointment tomorrow if at all possible. She will call clinic as well.   Rhonda [name], MD

## 2015-08-31 ENCOUNTER — Telehealth: Payer: Self-pay | Admitting: *Deleted

## 2015-08-31 NOTE — Telephone Encounter (Signed)
Pt calling stating she received the pace maker phone but was told she was going to receive something to put on it. And it has not come to her Please call patient.

## 2015-09-01 ENCOUNTER — Emergency Department
Admission: EM | Admit: 2015-09-01 | Discharge: 2015-09-01 | Disposition: A | Discharge status: Home / Self Care | Payer: Medicare Other | Attending: Emergency Medicine | Admitting: Emergency Medicine

## 2015-09-01 ENCOUNTER — Encounter: Payer: Self-pay | Admitting: Medical Oncology

## 2015-09-01 ENCOUNTER — Emergency Department: Payer: Medicare Other

## 2015-09-01 DIAGNOSIS — Z791 Long term (current) use of non-steroidal anti-inflammatories (NSAID): Secondary | ICD-10-CM | POA: Insufficient documentation

## 2015-09-01 DIAGNOSIS — Z87891 Personal history of nicotine dependence: Secondary | ICD-10-CM | POA: Diagnosis not present

## 2015-09-01 DIAGNOSIS — I129 Hypertensive chronic kidney disease with stage 1 through stage 4 chronic kidney disease, or unspecified chronic kidney disease: Secondary | ICD-10-CM | POA: Diagnosis not present

## 2015-09-01 DIAGNOSIS — R062 Wheezing: Secondary | ICD-10-CM | POA: Insufficient documentation

## 2015-09-01 DIAGNOSIS — R0602 Shortness of breath: Secondary | ICD-10-CM | POA: Insufficient documentation

## 2015-09-01 DIAGNOSIS — Z91040 Latex allergy status: Secondary | ICD-10-CM | POA: Diagnosis not present

## 2015-09-01 DIAGNOSIS — Z7951 Long term (current) use of inhaled steroids: Secondary | ICD-10-CM | POA: Diagnosis not present

## 2015-09-01 DIAGNOSIS — N181 Chronic kidney disease, stage 1: Secondary | ICD-10-CM | POA: Insufficient documentation

## 2015-09-01 DIAGNOSIS — Z79899 Other long term (current) drug therapy: Secondary | ICD-10-CM | POA: Insufficient documentation

## 2015-09-01 DIAGNOSIS — M79605 Pain in left leg: Secondary | ICD-10-CM | POA: Diagnosis not present

## 2015-09-01 DIAGNOSIS — M79662 Pain in left lower leg: Secondary | ICD-10-CM | POA: Diagnosis present

## 2015-09-01 LAB — CBC
HEMATOCRIT: 38 % (ref 35.0–47.0)
HEMOGLOBIN: 11.9 g/dL — AB (ref 12.0–16.0)
MCH: 27.4 pg (ref 26.0–34.0)
MCHC: 31.5 g/dL — ABNORMAL LOW (ref 32.0–36.0)
MCV: 87.2 fL (ref 80.0–100.0)
Platelets: 252 10*3/uL (ref 150–440)
RBC: 4.35 MIL/uL (ref 3.80–5.20)
RDW: 14.7 % — AB (ref 11.5–14.5)
WBC: 7.8 10*3/uL (ref 3.6–11.0)

## 2015-09-01 LAB — COMPREHENSIVE METABOLIC PANEL
ALK PHOS: 113 U/L (ref 38–126)
ALT: 16 U/L (ref 14–54)
AST: 20 U/L (ref 15–41)
Albumin: 4.2 g/dL (ref 3.5–5.0)
Anion gap: 10 (ref 5–15)
BILIRUBIN TOTAL: 0.4 mg/dL (ref 0.3–1.2)
BUN: 21 mg/dL — AB (ref 6–20)
CALCIUM: 10.3 mg/dL (ref 8.9–10.3)
CO2: 28 mmol/L (ref 22–32)
CREATININE: 1.27 mg/dL — AB (ref 0.44–1.00)
Chloride: 103 mmol/L (ref 101–111)
GFR calc Af Amer: 48 mL/min — ABNORMAL LOW (ref >60)
GFR, EST NON AFRICAN AMERICAN: 41 mL/min — AB (ref >60)
GLUCOSE: 89 mg/dL (ref 65–99)
POTASSIUM: 3.4 mmol/L — AB (ref 3.5–5.1)
Sodium: 141 mmol/L (ref 135–145)
TOTAL PROTEIN: 7.5 g/dL (ref 6.5–8.1)

## 2015-09-01 LAB — PROTIME-INR
INR: 2.18
Prothrombin Time: 24.1 seconds — ABNORMAL HIGH (ref 11.4–15.0)

## 2015-09-01 LAB — TROPONIN I

## 2015-09-01 LAB — BRAIN NATRIURETIC PEPTIDE: B Natriuretic Peptide: 60 pg/mL (ref 0.0–100.0)

## 2015-09-01 NOTE — Telephone Encounter (Signed)
Pt currently in the ED. Will call back

## 2015-09-01 NOTE — ED Provider Notes (Signed)
[facility] Emergency Department Provider Note  ____________________________________________  Time seen: On arrival  I have reviewed the triage vital signs and the nursing notes.   HISTORY  Chief Complaint Ankle Pain and Shortness of Breath    HPI Rhonda Yamaira Griffith is a 69 y.o. female with a history of mitral valve and aortic valve replacements on Coumadin who presents with left lower leg pain. She saw her PCP last week and an ultrasound which was negative for DVT. She reports the pain is primarily on her left ankle. Intermittent swelling is noted to both legs. No fevers no chills no redness. No rash. She also complains of mild shortness of breath that has been worsening over the last month. She denies chest pain. Her PCP did put her on a dose of Lasix which she says has not helped. She sees Dr. Mariah Milling for cardiology     Past Medical History  Diagnosis Date  . Atrial fibrillation, permanent (HCC)     a. CHA2DS2VASc = 4-->coumadin;  b. 09/2014 Echo: EF 55-60%, normal wall motion, mild AI/AS, nl MV, mod dil LA, mildly dil RA, mild to mod TR, PASP .  [name] Restless leg syndrome   . Pacemaker -Medtronic     a. implant for SSS  . 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 s/p mechanical valve replaced with bovine valve 2010.  [name] Leukocytosis   . Chronic kidney disease, stage I   . CVA (cerebral infarction)     a. 04/2015 - anticoagulation switched from eliquis to xarelto to coumadin.  [name] PAD (peripheral artery disease) (HCC)     a. w/ left lower ext claudication s/p ABI's 04/2015 showing nl ABI on Right with abnl waveforms on left sugg of L SFA dzs.    Patient Active Problem List   Diagnosis Date Noted  . Atrial fibrillation, permanent (HCC)   . Pacemaker -Medtronic   . 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 Chronic kidney disease, stage I   . Encounter for anticoagulation discussion and counseling 06/25/2015  . Acute  cardioembolic stroke (HCC) 06/25/2015  . PAD (peripheral artery disease) (HCC) 06/25/2015  . Essential hypertension 12/09/2014  . Gait instability 09/08/2014  . Fatigue 10/01/2013  . Shortness of breath 09/12/2012  . Stress disorder, acute 09/12/2012  . Tachycardia 09/12/2012  . Atrial fibrillation (HCC) 09/12/2012  . Sinoatrial node dysfunction (HCC)   . Edema 06/07/2011  . Diastolic CHF, chronic (HCC) 05/27/2011  . CARDIAC PACEMAKER IN SITU 10/03/2010    Past Surgical History  Procedure Laterality Date  . Insert / replace / remove pacemaker      implant for SSS  . Mitral valve replacement      s/p mechanical valve replaced with bovine valve 2010  . Melanoma excision      face  . Bladder injection    . Nasal sinus surgery    . Botox bladder      Current Outpatient Rx  Name  Route  Sig  Dispense  Refill  . albuterol (PROVENTIL HFA;VENTOLIN HFA) 108 (90 BASE) MCG/ACT inhaler   Inhalation   Inhale 2 puffs into the lungs every 6 (six) hours as needed for wheezing.         [name] ascorbic acid (VITAMIN C) 500 MG tablet   Oral   Take 500 mg by mouth daily.         [name] atorvastatin (LIPITOR) 80 MG tablet   Oral  Take 80 mg by mouth daily.         . calcium-vitamin D (OSCAL-500) 500-400 MG-UNIT per tablet   Oral   Take 1 tablet by mouth 2 (two) times daily.         . Cholecalciferol (VITAMIN D3) 1000 UNITS CAPS   Oral   Take 2,000 Units by mouth every 30 (thirty) days.          . cyclobenzaprine (FLEXERIL) 10 MG tablet   Oral   Take 10 mg by mouth at bedtime.          [name] DIAZEPAM PO   Oral   Take 5 mg by mouth as needed.          . diclofenac sodium (VOLTAREN) 1 % GEL   Topical   Apply 1 application topically 2 (two) times daily.           [name] diltiazem (CARDIZEM CD) 180 MG 24 hr capsule   Oral   Take 1 capsule (180 mg total) by mouth daily.         Tery Sanfilippo Calcium (STOOL SOFTENER PO)   Oral   Take 1 tablet by mouth 2 (two) times daily.          . DULoxetine (CYMBALTA) 30 MG capsule   Oral   Take 60 mg by mouth daily.          . fluticasone (FLONASE) 50 MCG/ACT nasal spray   Each Nare   Place into both nostrils daily.         . furosemide (LASIX) 20 MG tablet   Oral   Take 1 tablet (20 mg total) by mouth daily.   30 tablet   5   . magnesium oxide (MAG-OX) 400 MG tablet   Oral   Take 400 mg by mouth 2 (two) times daily.          . meclizine (ANTIVERT) 25 MG tablet   Oral   Take 25 mg by mouth as needed for dizziness.         . Multiple Vitamin (MULTIVITAMIN) tablet   Oral   Take 1 tablet by mouth daily.         [name] oxyCODONE-acetaminophen (PERCOCET) 5-325 MG per tablet   Oral   Take 1 tablet by mouth every 4 (four) hours as needed.           . pantoprazole (PROTONIX) 40 MG tablet   Oral   Take by mouth.         . potassium chloride (K-DUR) 10 MEQ tablet   Oral   Take 1 tablet (10 mEq total) by mouth daily. Take with lasix   90 tablet   3   . rOPINIRole (REQUIP) 2 MG tablet   Oral   Take 2 mg by mouth at bedtime.          [name] warfarin (COUMADIN) 2 MG tablet   Oral   Take 2 tablets (4 mg total) by mouth daily.   120 tablet   6     Allergies Morphine; Tape; Cephalexin; Latex; and Tizanidine  Family History  Problem Relation Age of Onset  . Family history unknown: Yes    Social History Social History  Substance Use Topics  . Smoking status: Former Smoker -- 1.00 packs/day for 5 years    Types: Cigarettes    Quit date: 12/29/1970  . Smokeless tobacco: None  . Alcohol Use: No    Review of Systems  Constitutional: Negative for  fever. Eyes: Negative for visual changes. ENT: Negative for sore throat Cardiovascular: Negative for chest pain. Respiratory: Positive for shortness of breath. Gastrointestinal: Negative for abdominal pain, vomiting and diarrhea. Genitourinary: Negative for dysuria. Musculoskeletal: Negative for back pain. Positive for left lower leg pain Skin:  Negative for rash. Neurological: Negative for headaches  Psychiatric: No anxiety    ____________________________________________   PHYSICAL EXAM:  VITAL SIGNS: ED Triage Vitals  Enc Vitals Group     BP 09/01/15 0945 151/70 mmHg     Pulse Rate 09/01/15 0945 77     Resp 09/01/15 0945 18     Temp 09/01/15 0945 97.5 F (36.4 C)     Temp Source 09/01/15 0945 Oral     SpO2 09/01/15 0945 96 %     Weight 09/01/15 0945 186 lb (84.369 kg)     Height 09/01/15 0945  (1.753 m)     Head Cir --      Peak Flow --      Pain Score 09/01/15 0946 8     Pain Loc --      Pain Edu? --      Excl. in GC? --      Constitutional: Alert and oriented. Well appearing and in no distress. Eyes: Conjunctivae are normal.  ENT   Head: Normocephalic and atraumatic.   Mouth/Throat: Mucous membranes are moist. Cardiovascular: Normal rate, regular rhythm. Normal and symmetric distal pulses are present in all extremities. No murmurs, rubs, or gallops. Respiratory: Mild tachypnea noted.. Breath sounds with scattered wheezes but equal bilaterally.  Gastrointestinal: Soft and non-tender in all quadrants. No distention. There is no CVA tenderness. Genitourinary: deferred Musculoskeletal: Nontender with normal range of motion in all extremities. Mild tenderness to palpation of the medial and lateral ankle on the left leg. No edema or swelling noted. No calf discomfort. No erythema. No pallor. 2+ distal pulses. Neurologic:  Normal speech and language. No gross focal neurologic deficits are appreciated. Skin:  Skin is warm, dry and intact. No rash noted. Psychiatric: Mood and affect are normal. Patient exhibits appropriate insight and judgment.  ____________________________________________    LABS (pertinent positives/negatives)  Labs Reviewed  CBC - Abnormal; Notable for the following:    Hemoglobin 11.9 (*)    MCHC 31.5 (*)    RDW 14.7 (*)    All other components within normal limits  BRAIN  NATRIURETIC PEPTIDE  COMPREHENSIVE METABOLIC PANEL  TROPONIN I  PROTIME-INR    ____________________________________________   EKG  ED ECG REPORT I, Jene Every, the attending physician, personally viewed and interpreted this ECG.   Date: 09/01/2015  EKG Time: 10:05 AM  Rate: 68  Rhythm: normal sinus rhythm, PACs noted  Axis: Normal  Intervals:left bundle branch block  ST&T Change: Nonspecific   ____________________________________________    RADIOLOGY I have personally reviewed any xrays that were ordered on this patient: Ankle x-ray is unremarkable Chest x-ray is unremarkable  ____________________________________________   PROCEDURES  Procedure(s) performed: none  Critical Care performed: none  ____________________________________________   INITIAL IMPRESSION / ASSESSMENT AND PLAN / ED COURSE  Pertinent labs & imaging results that were available during my care of the patient were reviewed by me and considered in my medical decision making (see chart for details).  Patient presents with left lower leg and ankle pain. No evidence of infection, DVT studies performed by PCP which were negative and no significant joint swelling. No erythema. 2+ distal pulses, full is warm and well perfused. Also complains of shortness  of breath. Given her history of valve replacements we will check labs, chest x-ray and reevaluate    x-rays are unremarkable. Labs unremarkable, vitals normal.. Patient feels well. Has appropriate outpatient follow-up. We'll discharge with PCP follow-up  ____________________________________________   FINAL CLINICAL IMPRESSION(S) / ED DIAGNOSES  Final diagnoses:  Lower extremity pain, anterior, left  Shortness of breath     Jene Everyobert Zeeva Courser, MD 09/01/15 1234

## 2015-09-01 NOTE — Discharge Instructions (Signed)

## 2015-09-01 NOTE — ED Notes (Signed)
Pt reports that she has been having left ankle pain and swelling that has continued to worsen along with sob that has worsened the last month.

## 2015-09-01 NOTE — ED Notes (Addendum)
Pt arrives with left leg swelling and pain, pt states she was seen by cards and had an US preformed that was negative for a DVT, states she was placed on lasix, MD at bedside, left foot and ankle area tender to touch

## 2015-09-02 NOTE — Telephone Encounter (Signed)
S/w pt who states she was told by "someone" at an OV that she would receive different equipment for pacemaker transmission. I reviewed Dr. Odessa FlemingKlein's notes from 10/11 OV and see no note regarding this. Pt indicates the phone that was provided is adequate for sending transmissions and she has had no difficulty with it. Advised pt to continue using same system and if equipment change is suggested, she would hear from device clinic. Pt verbalized understanding with no further questions.

## 2015-09-07 NOTE — Progress Notes (Signed)
No show

## 2015-09-08 ENCOUNTER — Encounter: Payer: Medicare Other | Admitting: Internal Medicine

## 2015-09-10 LAB — PROTIME-INR: INR: 1.9 — AB (ref 0.9–1.1)

## 2015-09-11 ENCOUNTER — Telehealth: Payer: Self-pay | Admitting: Cardiovascular Disease

## 2015-09-11 LAB — LAB REPORT - SCANNED: INR: 1.9

## 2015-09-11 NOTE — Telephone Encounter (Signed)
Called patient to let her know we got her message about her results but we need a hardcopy of the results faxed to us from her PCP office. She verbalized understanding & she gave me a number that was incorrect & I called the patient again to inform her of this and she stated she has called her PCP office & they will contact us.

## 2015-09-11 NOTE — Telephone Encounter (Signed)
PT/ INR per patient is 23.1 / 1.9 done yesterday at pcp .  Please let patient know if she should adjust dosage

## 2015-09-11 NOTE — Telephone Encounter (Signed)
Forwarded to coumadin clinic

## 2015-09-15 ENCOUNTER — Ambulatory Visit (INDEPENDENT_AMBULATORY_CARE_PROVIDER_SITE_OTHER): Payer: Medicare Other | Admitting: Cardiology

## 2015-09-15 DIAGNOSIS — I4891 Unspecified atrial fibrillation: Secondary | ICD-10-CM

## 2015-10-01 LAB — LAB REPORT - SCANNED: INR: 2.1

## 2015-10-13 ENCOUNTER — Telehealth: Payer: Self-pay | Admitting: Cardiovascular Disease

## 2015-10-13 DIAGNOSIS — R600 Localized edema: Secondary | ICD-10-CM

## 2015-10-13 NOTE — Telephone Encounter (Signed)
Pt c/o swelling: STAT is pt has developed SOB within 24 hours  1. How long have you been experiencing swelling?  For a while increased today  2. Where is the swelling located? L foot ankle up to top of ankle   3.  Are you currently taking a "fluid pill"?  No   4.  Are you currently SOB?  A little not sure if its from afib   5.  Have you traveled recently?  No

## 2015-10-14 NOTE — Telephone Encounter (Signed)
Spoke w/ pt.  She reports that she was told that the swelling in her foot is not r/t her heart, she is sched for a nerve test on Saturday.  She reports that she is elevating her foot and has her son wrap it daily w/ ACE bandage.  Pt reports that she limits her fluid and sodium intake.  She has lasix 20 mg , which she was advised at her last ov to take daily. She states that the 20 mg "does nothing for me" so she has not been taking.  Advised her to resume taking lasix 20 mg daily as prescribed.  She asks that I make Dr. Kirke Corin aware and see if he would like to increase this until she gets results back from her testing this weekend.  Advised her I will make him aware and call her back w/ his recommendation.

## 2015-10-14 NOTE — Telephone Encounter (Signed)
Increase Lasix to 40 mg once daily. Check basic metabolic profile in one week.

## 2015-10-15 MED ORDER — FUROSEMIDE 20 MG PO TABS: 40.0000 mg | ORAL_TABLET | Freq: Every day | ORAL | Status: DC

## 2015-10-15 NOTE — Telephone Encounter (Signed)
Spoke w/ pt.   Advised her of Dr. Jari Sportsman recommendation.  She is agreeable and would like to have her labs drawn when she comes in on 10/23/15 to see Dr. Mariah Milling.

## 2015-10-23 ENCOUNTER — Encounter: Payer: Self-pay | Admitting: Cardiovascular Disease

## 2015-10-23 ENCOUNTER — Ambulatory Visit (INDEPENDENT_AMBULATORY_CARE_PROVIDER_SITE_OTHER): Payer: Medicare Other | Admitting: Cardiovascular Disease

## 2015-10-23 VITALS — BP 130/62 | HR 67 | Ht 69.0 in | Wt 180.5 lb

## 2015-10-23 DIAGNOSIS — I4891 Unspecified atrial fibrillation: Secondary | ICD-10-CM

## 2015-10-23 DIAGNOSIS — Z7189 Other specified counseling: Secondary | ICD-10-CM

## 2015-10-23 DIAGNOSIS — R0602 Shortness of breath: Secondary | ICD-10-CM

## 2015-10-23 DIAGNOSIS — Z950 Presence of cardiac pacemaker: Secondary | ICD-10-CM

## 2015-10-23 DIAGNOSIS — R600 Localized edema: Secondary | ICD-10-CM | POA: Diagnosis not present

## 2015-10-23 DIAGNOSIS — I4821 Permanent atrial fibrillation: Secondary | ICD-10-CM

## 2015-10-23 DIAGNOSIS — I482 Chronic atrial fibrillation: Secondary | ICD-10-CM

## 2015-10-23 DIAGNOSIS — E785 Hyperlipidemia, unspecified: Secondary | ICD-10-CM

## 2015-10-23 DIAGNOSIS — I739 Peripheral vascular disease, unspecified: Secondary | ICD-10-CM

## 2015-10-23 DIAGNOSIS — I10 Essential (primary) hypertension: Secondary | ICD-10-CM | POA: Diagnosis not present

## 2015-10-23 MED ORDER — DILTIAZEM HCL ER COATED BEADS 120 MG PO CP24: 120.0000 mg | ORAL_CAPSULE | Freq: Every day | ORAL | Status: DC

## 2015-10-23 NOTE — Assessment & Plan Note (Signed)
Followed by Dr. Graciela Husbands EKG today with paced rhythm

## 2015-10-23 NOTE — Patient Instructions (Signed)
You are doing well.  Please decrease the diltiazem down to 120 mg daily (down from 180 mg daily) Compression hose in the daytime, up to the knees  Leg swelling most likely from vein problem, side effect of the diltiazem  Take lasix every other day with potassium every other day If you continue to have dry mouth and dry skin, Take lasix only as needed  Please call us if you have new issues that need to be addressed before your next appt.  Your physician wants you to follow-up in: 3 months.  You will receive a reminder letter in the mail two months in advance. If you don't receive a letter, please call our office to schedule the follow-up appointment.

## 2015-10-23 NOTE — Assessment & Plan Note (Signed)
She is tolerating anticoagulation without any significant bleeding side effects High fall risk given prior stroke and residual weakness. No recent falls

## 2015-10-23 NOTE — Assessment & Plan Note (Signed)
We have encouraged her to stay on her current medications. Lower extremity edema likely from diltiazem. Symptoms are very mild. We will decrease her Lasix down to every other day, have her wear compression hose, stay on anticoagulation

## 2015-10-23 NOTE — Assessment & Plan Note (Signed)
Encouraged her to stay on her Lipitor. Goal LDL less than 70 

## 2015-10-23 NOTE — Assessment & Plan Note (Addendum)
Leg edema likely venous insufficiency, exacerbated by diltiazem, less likely CHF. Her mouth is dry, skin is dry. Recommended she decrease the Lasix down to every other day, even wean off and take only as needed. Recommended compression hose   Total encounter time more than 25 minutes  Greater than 50% was spent in counseling and coordination of care with the patient

## 2015-10-23 NOTE — Progress Notes (Signed)
Patient ID: Rhonda Griffith, female    DOB: June 25, 1946, 70 y.o.   MRN: [id number]  HPI Comments: Rhonda Griffith is a pleasant 70 year old woman with a history of chronic atrial fibrillation, pacemaker, history of rheumatic valve disease with mitral valve replacement with bovine valve,   history of falls with a very large hematoma on her left leg earlier in 2012, history of  GI bleeding, left arm fracture, previously on warfarin, change to eliquis,  history of sick sinus syndrome for which a pacemaker was placed. History of bilateral knee replacements She presents today for follow-up of her atrial fibrillation, stroke (Apr 28, 2015) History of sleep apnea, not on CPAP  In follow-up today, she reports having leg swelling. This seemed to start after she was discharged from the hospital on diltiazem Diltiazem was added for heart rate control Worse when she is on her feet more. She's been taking Lasix daily with potassium with minimal improvement of her symptoms She has not been using compression hose, she does have Ace wraps around her ankles.  Otherwise tolerating her medications without any problems  EKG on today's visit shows paced rhythm, rate 67 bpm  Other past medical history reviewed History of heavy vaginal bleeding, none recently Biggest complaint is left lower extremity toe drop, foot drop.  Previously had shortness of breath, improved with Lasix.  spends significant time playing on the computer  She does take a pain pill occasionally to sleep better.  Valium for anxiety. severe bilateral knee pain.  Statin was held in the past for leg pain, Zocor.  Previous spinal tap at [facility] for high pressure in her ventricle. She reports no significant improvement after this procedure (drain?).  Placement of a bladder stimulator implant for overactive bladder was denied despite cardiac clearance . Total cholesterol 184, LDL 98, HDL 63    Allergies  Allergen Reactions  . Morphine    . Tape     Allergic to plastic/latex tape. Only use paper tape on patient.  . Cephalexin Rash  . Latex Rash  . Tizanidine Palpitations    Outpatient Encounter Prescriptions as of 10/23/2015  Medication Sig  . albuterol (PROVENTIL HFA;VENTOLIN HFA) 108 (90 BASE) MCG/ACT inhaler Inhale 2 puffs into the lungs every 6 (six) hours as needed for wheezing.  [name] ascorbic acid (VITAMIN C) 500 MG tablet Take 500 mg by mouth daily.  [name] atorvastatin (LIPITOR) 80 MG tablet Take 80 mg by mouth daily.  . calcium-vitamin D (OSCAL-500) 500-400 MG-UNIT per tablet Take 1 tablet by mouth 2 (two) times daily.  . Cholecalciferol (VITAMIN D3) 1000 UNITS CAPS Take 2,000 Units by mouth every 30 (thirty) days.   . cyclobenzaprine (FLEXERIL) 10 MG tablet Take 10 mg by mouth at bedtime.   [name] DIAZEPAM PO Take 5 mg by mouth as needed.   . diclofenac sodium (VOLTAREN) 1 % GEL Apply 1 application topically 2 (two) times daily.    [name] diltiazem (CARDIZEM CD) 120 MG 24 hr capsule Take 1 capsule (120 mg total) by mouth daily.  Tery Sanfilippo Calcium (STOOL SOFTENER PO) Take 1 tablet by mouth 2 (two) times daily.  . DULoxetine (CYMBALTA) 30 MG capsule Take 60 mg by mouth daily.   . fluticasone (FLONASE) 50 MCG/ACT nasal spray Place into both nostrils daily.  . furosemide (LASIX) 20 MG tablet Take 2 tablets (40 mg total) by mouth daily.  . magnesium oxide (MAG-OX) 400 MG tablet Take 400 mg by mouth 2 (two) times daily.   . meclizine (  ANTIVERT) 25 MG tablet Take 25 mg by mouth as needed for dizziness.  . Multiple Vitamin (MULTIVITAMIN) tablet Take 1 tablet by mouth daily.  [name] oxyCODONE-acetaminophen (PERCOCET) 5-325 MG per tablet Take 1 tablet by mouth every 4 (four) hours as needed.    . pantoprazole (PROTONIX) 40 MG tablet Take by mouth.  . potassium chloride (K-DUR) 10 MEQ tablet Take 1 tablet (10 mEq total) by mouth daily. Take with lasix  . rOPINIRole (REQUIP) 2 MG tablet Take 2 mg by mouth at bedtime.   [name] warfarin (COUMADIN) 2  MG tablet Take 2 tablets (4 mg total) by mouth daily.  . [DISCONTINUED] diltiazem (CARDIZEM CD) 180 MG 24 hr capsule Take 1 capsule (180 mg total) by mouth daily.   No facility-administered encounter medications on file as of 10/23/2015.    Past Medical History  Diagnosis Date  . Atrial fibrillation, permanent (HCC)     a. CHA2DS2VASc = 4-->coumadin;  b. 09/2014 Echo: EF 55-60%, normal wall motion, mild AI/AS, nl MV, mod dil LA, mildly dil RA, mild to mod TR, PASP .  [name] Restless leg syndrome   . Pacemaker -Medtronic     a. implant for SSS  . 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 s/p mechanical valve replaced with bovine valve 2010.  [name] Leukocytosis   . Chronic kidney disease, stage I   . CVA (cerebral infarction)     a. 04/2015 - anticoagulation switched from eliquis to xarelto to coumadin.  [name] PAD (peripheral artery disease) (HCC)     a. w/ left lower ext claudication s/p ABI's 04/2015 showing nl ABI on Right with abnl waveforms on left sugg of L SFA dzs.    Past Surgical History  Procedure Laterality Date  . Insert / replace / remove pacemaker      implant for SSS  . Mitral valve replacement      s/p mechanical valve replaced with bovine valve 2010  . Melanoma excision      face  . Bladder injection    . Nasal sinus surgery    . Botox bladder      Social History  reports that she quit smoking about 44 years ago. Her smoking use included Cigarettes. She has a 5 pack-year smoking history. She does not have any smokeless tobacco history on file. She reports that she does not drink alcohol or use illicit drugs.  Family History Family history is unknown by patient.   Review of Systems  Constitutional: Negative.   Respiratory: Negative.   Cardiovascular: Positive for leg swelling.  Gastrointestinal: Negative.   Musculoskeletal: Positive for joint swelling and gait problem.  Neurological:       Problems with motor coordination and gait.  Hematological:  Negative.   Psychiatric/Behavioral: Negative.   All other systems reviewed and are negative.   BP 130/62 mmHg  Pulse 67  Ht  (1.753 m)  Wt 180 lb 8 oz (81.874 kg)  BMI 26.64 kg/m2  Physical Exam  Constitutional: She is oriented to person, place, and time. She appears well-developed and well-nourished.  Leg weakness, presents in a wheelchair  HENT:  Head: Normocephalic.  Nose: Nose normal.  Mouth/Throat: Oropharynx is clear and moist.  Eyes: Conjunctivae are normal. Pupils are equal, round, and reactive to light.  Neck: Normal range of motion. Neck supple. No JVD present.  Cardiovascular: Normal rate, regular rhythm, S1 normal, S2 normal, normal heart sounds and intact distal pulses.  Exam reveals no  gallop and no friction rub.   No murmur heard. Trace edema around the shins bilaterally  Pulmonary/Chest: Effort normal and breath sounds normal. No respiratory distress. She has no wheezes. She has no rales. She exhibits no tenderness.  Abdominal: Soft. Bowel sounds are normal. She exhibits no distension. There is no tenderness.  Musculoskeletal: Normal range of motion. She exhibits no edema or tenderness.  Lymphadenopathy:    She has no cervical adenopathy.  Neurological: She is alert and oriented to person, place, and time. Coordination normal.  Skin: Skin is warm and dry. No rash noted. No erythema.  Psychiatric: She has a normal mood and affect. Her behavior is normal. Judgment and thought content normal.    Assessment and Plan  Nursing note and vitals reviewed.

## 2015-10-23 NOTE — Assessment & Plan Note (Signed)
Followed by Dr. Kirke Corin for her PAD We'll continue aggressive cholesterol management

## 2015-12-01 ENCOUNTER — Telehealth: Payer: Self-pay | Admitting: Cardiovascular Disease

## 2015-12-01 NOTE — Telephone Encounter (Signed)
Sent to wrong provider, This is Dr Kirke CorinArida patient Issue  Please see below.

## 2015-12-01 NOTE — Telephone Encounter (Signed)
S/w pt who reports increased bilateral lower ankle and foot edema yesterday. Reports they looked "a little blue".  Bilateral lower extremities are chronically swollen. No change in legs. She took lasix 20mg  today with some improvement but states it usually takes 40mg  lasix "to see much improvement".  States she was instructed to take 20mg  daily PRN therefore, she has not been taking it daily.  Per 08/24/15 notes: "She had improvement with left leg edema with Lasix. Thus, I advised her to continue with smaller dose of 20 mg once daily" Reports ankles are not blue today but she does not have a lot of feeling in them now. Asked pt to check pulses in feet. Advised pt to continue lasix 20mg  daily, elevate legs, monitor pedal pulses. If pulses are absent, she understands to be seen emergently. She was last seen by Dr. Kirke CorinArida November 2016 for PAD. Forward to scheduling for appt. Forward to MD to make aware.

## 2015-12-01 NOTE — Telephone Encounter (Signed)
Pt c/o swelling: STAT is pt has developed SOB within 24 hours  1. How long have you been experiencing swelling? About a week and last night it was just one leg this morning it was both and it was like they were blue  2. Where is the swelling located? legs  3.  Are you currently taking a "fluid pill"? Yes took 20 mg   4.  Are you currently SOB? No   5.  Have you traveled recently? No  Needs advise what else she can do Please call back.

## 2015-12-02 NOTE — Telephone Encounter (Signed)
She can take Lasix 40 mg daily for 3 days then back to 20 mg once daily. This is not an urgent issue. Her pulses are likely not palpable on the left side which is not new.

## 2015-12-03 ENCOUNTER — Encounter: Payer: Self-pay | Admitting: Pharmacist

## 2015-12-03 ENCOUNTER — Ambulatory Visit: Payer: Self-pay | Admitting: Pharmacist

## 2015-12-03 DIAGNOSIS — I4891 Unspecified atrial fibrillation: Secondary | ICD-10-CM

## 2015-12-03 NOTE — Telephone Encounter (Signed)
Pt returned call. Reviewed MD instructions w/pt who verbalized understanding and would like to keep March 23 OV w/Dr. Kirke CorinArida.

## 2015-12-03 NOTE — Telephone Encounter (Signed)
This encounter was created in error - please disregard.

## 2015-12-03 NOTE — Telephone Encounter (Signed)
Left message on machine for patient to contact the office.   

## 2015-12-07 ENCOUNTER — Telehealth: Payer: Self-pay | Admitting: Internal Medicine

## 2015-12-07 NOTE — Telephone Encounter (Signed)
Spoke w/ pt and informed her that she does not need to bring her home monitor to the appt tomorrow b/c her device w/ be checked by a tech. Also, informed pt that the wirex adapter was never ordered but I ordered it while I was on the phone w/ the pt. Pt verbalized understanding and say she may need to cancel the appt for tomorrow due to transportation issues. I instructed pt that if so she needed to before the end of the b/c she could be charged a no show fee. Pt verbalized understanding.

## 2015-12-07 NOTE — Telephone Encounter (Signed)
Pt sates she has not gotten her "part" for her remote machine. Asks if she should bring her machine to her appt tomorrow. Please call and advise.

## 2015-12-08 ENCOUNTER — Encounter: Payer: Self-pay | Admitting: Internal Medicine

## 2015-12-08 ENCOUNTER — Ambulatory Visit (INDEPENDENT_AMBULATORY_CARE_PROVIDER_SITE_OTHER): Payer: Medicare Other | Admitting: Internal Medicine

## 2015-12-08 VITALS — BP 150/78 | HR 68 | Ht 69.0 in | Wt 181.5 lb

## 2015-12-08 DIAGNOSIS — I5032 Chronic diastolic (congestive) heart failure: Secondary | ICD-10-CM

## 2015-12-08 DIAGNOSIS — I10 Essential (primary) hypertension: Secondary | ICD-10-CM | POA: Diagnosis not present

## 2015-12-08 DIAGNOSIS — I4891 Unspecified atrial fibrillation: Secondary | ICD-10-CM | POA: Diagnosis not present

## 2015-12-08 MED ORDER — FUROSEMIDE 20 MG PO TABS: 40.0000 mg | ORAL_TABLET | ORAL | Status: DC

## 2015-12-08 NOTE — Progress Notes (Signed)
skf      Patient Care Team: Nonda Louodd Bellmore Shapely-Quinn, MD as PCP - General (Family Medicine) Antonieta Ibaimothy J Gollan, MD as Consulting Physician (Cardiology) [facility] SalviaSteven C Yanette Tripoli, MD as Consulting Physician (Cardiology) [place] OuchMuhammad A Arida, MD as Consulting Physician (Cardiology)   HPI  Rhonda Griffith is a 70 y.o. female Former patient of Dr. Leonia ReevesGT who underwent pacemaker implantation for symptomatic bradycardia in the context of atrial fibrillation.  She also has a history of mitral   valve disease status pos tbioprosthetic mitral valve replacement. Last echocardiogram 1/16 2 demonstrated normal LV function normal valvular function mild-moderate left atrial dilatation  Intercurrent hx notable for stroke and [facility]   [facility] reviewed they wanted to try something diffferent;  she subsequently came back to see Dr. Knute NeuG who elected to put her on Coumadin with a target INR of 2.0--3.0.  She tolerates the warfarin.  She continues with fatigue and dyspnea. This seems to be somewhat less prominent 120 mg of diltiazem compared to the 180s.  Her urinary response to 20 mg of furosemide is scant but 40 is brisk   .  8/16 >>Echo EF 55%   Past Medical History  Diagnosis Date  . Atrial fibrillation, permanent (HCC)     a. CHA2DS2VASc = 4-->coumadin;  b. 09/2014 Echo: EF 55-60%, normal wall motion, mild AI/AS, nl MV, mod dil LA, mildly dil RA, mild to mod TR, PASP 47mmHg.  [name]. Restless leg syndrome   . Pacemaker -Medtronic     a. implant for SSS  . 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 s/p mechanical valve replaced with bovine valve 2010.  [name]. Leukocytosis   . Chronic kidney disease, stage I   . CVA (cerebral infarction)     a. 04/2015 - anticoagulation switched from eliquis to xarelto to coumadin.  [name]. PAD (peripheral artery disease) (HCC)     a. w/ left lower ext claudication s/p ABI's 04/2015 showing nl ABI on Right with abnl waveforms on  left sugg of L SFA dzs.    Past Surgical History  Procedure Laterality Date  . Insert / replace / remove pacemaker      implant for SSS  . Mitral valve replacement      s/p mechanical valve replaced with bovine valve 2010  . Melanoma excision      face  . Bladder injection    . Nasal sinus surgery    . Botox bladder      Current Outpatient Prescriptions  Medication Sig Dispense Refill  . albuterol (PROVENTIL HFA;VENTOLIN HFA) 108 (90 BASE) MCG/ACT inhaler Inhale 2 puffs into the lungs every 6 (six) hours as needed for wheezing.    [name]. ascorbic acid (VITAMIN C) 500 MG tablet Take 500 mg by mouth daily.    [name]. atorvastatin (LIPITOR) 80 MG tablet Take 80 mg by mouth daily.    . calcium-vitamin D (OSCAL-500) 500-400 MG-UNIT per tablet Take 1 tablet by mouth 2 (two) times daily.    . Cholecalciferol (VITAMIN D3) 1000 UNITS CAPS Take 2,000 Units by mouth every 30 (thirty) days.     . cyclobenzaprine (FLEXERIL) 10 MG tablet Take 10 mg by mouth at bedtime.     [name]. DIAZEPAM PO Take 5 mg by mouth as needed.     . diclofenac sodium (VOLTAREN) 1 % GEL Apply 1 application topically 2 (two) times daily.      [name]. diltiazem (CARDIZEM CD) 120 MG  24 hr capsule Take 1 capsule (120 mg total) by mouth daily. 90 capsule 3  . Docusate Calcium (STOOL SOFTENER PO) Take 1 tablet by mouth 2 (two) times daily.    . DULoxetine (CYMBALTA) 30 MG capsule Take 60 mg by mouth daily.     . fluticasone (FLONASE) 50 MCG/ACT nasal spray Place into both nostrils daily.    . furosemide (LASIX) 20 MG tablet Take 2 tablets (40 mg total) by mouth daily. 30 tablet 5  . magnesium oxide (MAG-OX) 400 MG tablet Take 400 mg by mouth 2 (two) times daily.     . meclizine (ANTIVERT) 25 MG tablet Take 25 mg by mouth as needed for dizziness.    . Multiple Vitamin (MULTIVITAMIN) tablet Take 1 tablet by mouth daily.    [name] oxyCODONE-acetaminophen (PERCOCET) 5-325 MG per tablet Take 1 tablet by mouth every 4 (four) hours as needed.      .  pantoprazole (PROTONIX) 40 MG tablet Take by mouth.    . potassium chloride (K-DUR) 10 MEQ tablet Take 1 tablet (10 mEq total) by mouth daily. Take with lasix 90 tablet 3  . rOPINIRole (REQUIP) 2 MG tablet Take 2 mg by mouth at bedtime.     [name] warfarin (COUMADIN) 2 MG tablet Take 2 tablets (4 mg total) by mouth daily. 120 tablet 6   No current facility-administered medications for this visit.    Allergies  Allergen Reactions  . Morphine   . Tape     Allergic to plastic/latex tape. Only use paper tape on patient.  . Cephalexin Rash  . Latex Rash  . Tizanidine Palpitations    Review of Systems negative except from HPI and PMH  Physical Exam BP 150/78 mmHg  Pulse 68  Ht  (1.753 m)  Wt 181 lb 8 oz (82.328 kg)  BMI 26.79 kg/m2 Well developed and nourished in no acute distress HENT normal Neck supple with JVP-flat Clear Irrgelary Regular rate and rhythm, no murmurs or gallops Abd-soft with active BS No Clubbing cyanosis edema Skin-warm and dry A & Oriented  Grossly normal sensory and motor function  ECG demonstrates atrial fibrillation with      Assessment and  Plan  Atrial fibrillation -permanent  Bradycardia  Pacemaker-Medtronic  History of mitral valve replacement, mechanical followed by bioprosthetic most recently 2010  Stroke  Hypertension catheter 95 he is reasonably time on a schedule no cyanosis  Dyspnea on exertion   Heart rate excursion seems to be more blunted. About 10% of her beats are faster than 100 She is tolerating a lower dose of diltiazem better than the higher dose.  We'll have her take her Lasix every other day as she urinates with 40 but does not notice  any appreciable effect at 20 mg a day  contninue anticoagulation with coumadin

## 2015-12-08 NOTE — Patient Instructions (Signed)
Medication Instructions:  Your physician has recommended you make the following change in your medication:  DECREASE lasix to 40mg  every other day   Labwork: none  Testing/Procedures: none  Follow-Up: Your physician wants you to follow-up in: one year with Dr. Graciela HusbandsKlein.  You will receive a reminder letter in the mail two months in advance. If you don't receive a letter, please call our office to schedule the follow-up appointment.   Any Other Special Instructions Will Be Listed Below (If Applicable).     If you need a refill on your cardiac medications before your next appointment, please call your pharmacy.

## 2015-12-11 ENCOUNTER — Encounter: Payer: Self-pay | Admitting: Cardiovascular Disease

## 2015-12-11 ENCOUNTER — Ambulatory Visit (INDEPENDENT_AMBULATORY_CARE_PROVIDER_SITE_OTHER): Payer: Medicare Other | Admitting: Cardiovascular Disease

## 2015-12-11 VITALS — BP 140/80 | HR 66 | Ht 69.0 in | Wt 182.5 lb

## 2015-12-11 DIAGNOSIS — I739 Peripheral vascular disease, unspecified: Secondary | ICD-10-CM | POA: Diagnosis not present

## 2015-12-11 NOTE — Patient Instructions (Signed)
Medication Instructions: Continue same medications.   Labwork: None.   Procedures/Testing: None.   Follow-Up: 1 year with Dr. Kirke CorinArida. Keep regular follow up with Dr. Mariah MillingGollan  Any Additional Special Instructions Will Be Listed Below (If Applicable).  Elevated your legs above the heart level for 15 minutes 3 times daily.  Wear knee high compression stockings during the day and remove before going to bed.     If you need a refill on your cardiac medications before your next appointment, please call your pharmacy.

## 2015-12-11 NOTE — Progress Notes (Signed)
Cardiology Office Note   Date:  12/11/2015   ID:  Rhonda, Griffith Apr 24, 1946, MRN [id number]  PCP:  [name], MD  Cardiologist:  Dr. Mariah Milling  Chief Complaint  Patient presents with  . other    Pt. c/o left leg swelling.      History of Present Illness: Rhonda Griffith is a 70 y.o. female who presents for A follow-up visit regarding peripheral arterial disease and chronic venous insufficiency.  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  She was seen for left calf claudication which started before her stroke. She underwent an invasive vascular evaluation which showed a normal ABI on the right and mildly reduced on the left at 0.7. Duplex showed possible distal left SFA disease but was not well-visualized. She was treated conservatively.  Her biggest issue is bilateral leg edema worse at the end of the day. She was concerned about the possible cyanosis that usually gets worse when the swelling is worse. The edema usually response to increased dose of furosemide. She has not been following recommendations of elevating her legs and she has not been using support stockings. She has no convincing evidence of left leg claudication and her mobility is overall very limited due to previous stroke. She walks with a walker.  Past Medical History  Diagnosis Date  . Atrial fibrillation, permanent (HCC)     a. CHA2DS2VASc = 4-->coumadin;  b. 09/2014 Echo: EF 55-60%, normal wall motion, mild AI/AS, nl MV, mod dil LA, mildly dil RA, mild to mod TR, PASP .  [name] Restless leg syndrome   . Pacemaker -Medtronic     a. implant  for SSS  . 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 s/p mechanical valve replaced with bovine valve 2010.  [name] Leukocytosis   . Chronic kidney disease, stage I   . CVA (cerebral infarction)     a. 04/2015 - anticoagulation switched from eliquis to xarelto to coumadin.  [name] PAD (peripheral artery disease) (HCC)     a. w/ left lower ext claudication s/p ABI's 04/2015 showing nl ABI on Right with abnl waveforms on left sugg of L SFA dzs.    Past Surgical History  Procedure Laterality Date  . Insert / replace / remove pacemaker      implant for SSS  . Mitral valve replacement      s/p mechanical valve replaced with bovine valve 2010  . Melanoma excision      face  . Bladder injection    . Nasal sinus surgery    . Botox bladder       Current Outpatient Prescriptions  Medication Sig Dispense Refill  . albuterol (PROVENTIL HFA;VENTOLIN HFA) 108 (90 BASE) MCG/ACT inhaler Inhale 2 puffs into the lungs every 6 (six) hours as needed for wheezing.    [name] ascorbic acid (VITAMIN C) 500 MG tablet Take 500 mg by mouth daily.    [name] atorvastatin (LIPITOR) 80 MG tablet Take 80 mg by mouth daily.    . calcium-vitamin D (OSCAL-500) 500-400 MG-UNIT per tablet Take 1 tablet by mouth 2 (two) times daily.    . Cholecalciferol (VITAMIN D3)  1000 UNITS CAPS Take 2,000 Units by mouth every 30 (thirty) days.     . cyclobenzaprine (FLEXERIL) 10 MG tablet Take 10 mg by mouth at bedtime.     [name] DIAZEPAM PO Take 5 mg by mouth as needed.     . diclofenac sodium (VOLTAREN) 1 % GEL Apply 1 application topically 2 (two) times daily.      [name] diltiazem (CARDIZEM CD) 120 MG 24 hr capsule Take 1 capsule (120 mg total) by mouth daily. 90 capsule 3  . Docusate Calcium (STOOL SOFTENER PO) Take 1 tablet by mouth 2 (two) times daily.    . DULoxetine (CYMBALTA) 30 MG capsule Take 60 mg by mouth daily.     . fluticasone (FLONASE) 50 MCG/ACT nasal spray Place into both nostrils daily.    . furosemide (LASIX) 20 MG tablet  Take 2 tablets (40 mg total) by mouth every other day. 30 tablet 5  . magnesium oxide (MAG-OX) 400 MG tablet Take 400 mg by mouth 2 (two) times daily.     . meclizine (ANTIVERT) 25 MG tablet Take 25 mg by mouth as needed for dizziness.    . Multiple Vitamin (MULTIVITAMIN) tablet Take 1 tablet by mouth daily.    [name] oxyCODONE-acetaminophen (PERCOCET) 5-325 MG per tablet Take 1 tablet by mouth every 4 (four) hours as needed.      . pantoprazole (PROTONIX) 40 MG tablet Take by mouth.    . potassium chloride (K-DUR) 10 MEQ tablet Take 1 tablet (10 mEq total) by mouth daily. Take with lasix 90 tablet 3  . rOPINIRole (REQUIP) 2 MG tablet Take 2 mg by mouth at bedtime.     [name] warfarin (COUMADIN) 2 MG tablet Take 2 tablets (4 mg total) by mouth daily. 120 tablet 6   No current facility-administered medications for this visit.    Allergies:   Morphine; Tape; Cephalexin; Latex; and Tizanidine    Social History:  The patient  reports that she quit smoking about 44 years ago. Her smoking use included Cigarettes. She has a 5 pack-year smoking history. She does not have any smokeless tobacco history on file. She reports that she does not drink alcohol or use illicit drugs.   Family History:  The patient's Family history is unknown by patient.      PHYSICAL EXAM: VS:  BP 140/80 mmHg  Pulse 66  Ht  (1.753 m)  Wt 182 lb 8 oz (82.781 kg)  BMI 26.94 kg/m2 , BMI Body mass index is 26.94 kg/(m^2). GEN: Well nourished, well developed, in no acute distress HEENT: normal Neck: no JVD, carotid bruits, or masses Cardiac: RRR; no murmurs, rubs, or gallops. Respiratory:  clear to auscultation bilaterally, normal work of breathing GI: soft, nontender, nondistended, + BS MS: no deformity or atrophy Skin: warm and dry, no rash Neuro:  Strength and sensation are intact Psych: euthymic mood, full affect Vascular: Posterior tibial pulses palpable bilaterally. She has trace edema bilaterally with varicose  veins and mild stasis.   EKG:  EKG is not ordered today.   Recent Labs: 09/01/2015: ALT 16; B Natriuretic Peptide 60.0; BUN 21*; Creatinine, Ser 1.27*; Hemoglobin 11.9*; Platelets 252; Potassium 3.4*; Sodium 141    Lipid Panel No results found for: CHOL, TRIG, HDL, CHOLHDL, VLDL, LDLCALC, LDLDIRECT    Wt Readings from Last 3 Encounters:  12/11/15 182 lb 8 oz (82.781 kg)  12/08/15 181 lb 8 oz (82.328 kg)  10/23/15 180 lb 8 oz (81.874 kg)  ASSESSMENT AND PLAN:  1.  Chronic venous insufficiency: Her leg edema is likely due to chronic venous insufficiency. Diltiazem might also be contributing. I advised her to elevate her legs 3 times daily and also to use knee-high support stockings. She can continue to use furosemide as she has been doing. The discoloration is due to venous stasis. She does not appear to have significant leg ischemia as her posterior tibial pulses palpable bilaterally.  2. Peripheral arterial disease: Previous vascular studies showed possible left SFA disease. She appears to be asymptomatic at the present time likely due to her limited mobility. Continue medical therapy.  3. Atrial fibrillation: Ventricular rate is controlled and she is on anticoagulation.  4. Hyperlipidemia: Continue treatment with atorvastatin with a target LDL of less than 70.     Disposition:   FU with me in 1 year  Signed,  Lorine BearsMuhammad Adena Sima, MD  12/11/2015 10:53 AM    [facility]

## 2015-12-17 ENCOUNTER — Ambulatory Visit: Payer: Medicare Other | Admitting: Cardiovascular Disease

## 2015-12-22 ENCOUNTER — Encounter: Payer: Self-pay | Admitting: Internal Medicine

## 2015-12-22 ENCOUNTER — Ambulatory Visit (INDEPENDENT_AMBULATORY_CARE_PROVIDER_SITE_OTHER): Payer: Medicare Other | Admitting: Cardiovascular Disease

## 2015-12-22 ENCOUNTER — Encounter: Payer: Self-pay | Admitting: Cardiovascular Disease

## 2015-12-22 VITALS — BP 140/62 | HR 75 | Ht 69.0 in | Wt 177.2 lb

## 2015-12-22 DIAGNOSIS — I080 Rheumatic disorders of both mitral and aortic valves: Secondary | ICD-10-CM

## 2015-12-22 DIAGNOSIS — R0989 Other specified symptoms and signs involving the circulatory and respiratory systems: Secondary | ICD-10-CM | POA: Insufficient documentation

## 2015-12-22 DIAGNOSIS — I5032 Chronic diastolic (congestive) heart failure: Secondary | ICD-10-CM

## 2015-12-22 DIAGNOSIS — I4891 Unspecified atrial fibrillation: Secondary | ICD-10-CM

## 2015-12-22 DIAGNOSIS — R600 Localized edema: Secondary | ICD-10-CM

## 2015-12-22 DIAGNOSIS — I10 Essential (primary) hypertension: Secondary | ICD-10-CM | POA: Diagnosis not present

## 2015-12-22 DIAGNOSIS — E785 Hyperlipidemia, unspecified: Secondary | ICD-10-CM

## 2015-12-22 NOTE — Assessment & Plan Note (Signed)
Mitral valve working well by echocardiogram 2016 Mild aortic valve stenosis

## 2015-12-22 NOTE — Patient Instructions (Addendum)
You are doing well. No medication changes were made.  We will order a carotid ultrasound for carotid bruit  Date & time: ___________________________  Please call us if you have new issues that need to be addressed before your next appt.  Your physician wants you to follow-up in: 6 months.  You will receive a reminder letter in the mail two months in advance. If you don't receive a letter, please call our office to schedule the follow-up appointment.

## 2015-12-22 NOTE — Assessment & Plan Note (Signed)
Blood pressure is well controlled on today's visit. No changes made to the medications. 

## 2015-12-22 NOTE — Assessment & Plan Note (Signed)
Long discussion concerning her leg edema. If symptoms get worse, we would hold the diltiazem, start beta blockers She will call if symptoms get worse

## 2015-12-22 NOTE — Assessment & Plan Note (Signed)
Encouraged her to stay on her Lipitor   Total encounter time more than 25 minutes  Greater than 50% was spent in counseling and coordination of care with the patient

## 2015-12-22 NOTE — Assessment & Plan Note (Signed)
Carotid bruit appreciated at the base of the neck Carotid ultrasound ordered Possibly secondary to radiation from aortic valve stenosis

## 2015-12-22 NOTE — Assessment & Plan Note (Signed)
Appears euvolemic on today's visit alternating Lasix 40 mg with 20 mg No significant leg edema We'll continue current regiment. She could take extra Lasix after lunch if needed for worsening leg swelling, weight gain

## 2015-12-22 NOTE — Progress Notes (Signed)
Patient ID: Rhonda Griffith, female    DOB: 1945/11/18, 70 y.o.   MRN: [id number]  HPI Comments: Rhonda Griffith is a pleasant 70 year old woman with a history of chronic atrial fibrillation, pacemaker, history of rheumatic valve disease with mitral valve replacement with bovine valve,   history of falls with a very large hematoma on her left leg earlier in 2012, history of  GI bleeding, left arm fracture, previously on warfarin, change to eliquis,  history of sick sinus syndrome for which a pacemaker was placed. History of bilateral knee replacements She presents today for follow-up of her atrial fibrillation, stroke (Apr 28, 2015) History of sleep apnea, not on CPAP  In follow-up today, leg edema has improved, continues to wax and wane Weight is down 3 pounds from her prior clinic visit She is alternating Lasix 20 mg with 40 mg Wearing compression hose faithfully Legs feel heavy, continues to drag one of her feet Leg swelling is worse after she has been on her feet for long periods of time  EKG showing paced rhythm, rate 75 bpm, APCs  Other past medical history reviewed History of heavy vaginal bleeding, none recently Biggest complaint is left lower extremity toe drop, foot drop.  Previously had shortness of breath, improved with Lasix.  spends significant time playing on the computer  She does take a pain pill occasionally to sleep better.  Valium for anxiety. severe bilateral knee pain.  Statin was held in the past for leg pain, Zocor.  Previous spinal tap at [facility] for high pressure in her ventricle. She reports no significant improvement after this procedure (drain?).  Placement of a bladder stimulator implant for overactive bladder was denied despite cardiac clearance . Total cholesterol 184, LDL 98, HDL 63    Allergies  Allergen Reactions  . Morphine   . Tape     Allergic to plastic/latex tape. Only use paper tape on patient.  . Cephalexin Rash  . Latex Rash  .  Tizanidine Palpitations    Outpatient Encounter Prescriptions as of 12/22/2015  Medication Sig  . albuterol (PROVENTIL HFA;VENTOLIN HFA) 108 (90 BASE) MCG/ACT inhaler Inhale 2 puffs into the lungs every 6 (six) hours as needed for wheezing.  [name] ascorbic acid (VITAMIN C) 500 MG tablet Take 500 mg by mouth daily.  [name] atorvastatin (LIPITOR) 80 MG tablet Take 80 mg by mouth daily.  . calcium-vitamin D (OSCAL-500) 500-400 MG-UNIT per tablet Take 1 tablet by mouth 2 (two) times daily.  . Cholecalciferol (VITAMIN D3) 1000 UNITS CAPS Take 2,000 Units by mouth every 30 (thirty) days.   . cyclobenzaprine (FLEXERIL) 10 MG tablet Take 10 mg by mouth at bedtime.   [name] DIAZEPAM PO Take 5 mg by mouth as needed.   . diclofenac sodium (VOLTAREN) 1 % GEL Apply 1 application topically 2 (two) times daily.    [name] diltiazem (CARDIZEM CD) 120 MG 24 hr capsule Take 1 capsule (120 mg total) by mouth daily.  Tery Sanfilippo Calcium (STOOL SOFTENER PO) Take 1 tablet by mouth 2 (two) times daily.  . DULoxetine (CYMBALTA) 30 MG capsule Take 60 mg by mouth daily.   . fluticasone (FLONASE) 50 MCG/ACT nasal spray Place into both nostrils daily.  . furosemide (LASIX) 20 MG tablet Take 2 tablets (40 mg total) by mouth every other day.  . magnesium oxide (MAG-OX) 400 MG tablet Take 400 mg by mouth 2 (two) times daily.   . meclizine (ANTIVERT) 25 MG tablet Take 25 mg by mouth as needed  for dizziness.  . Multiple Vitamin (MULTIVITAMIN) tablet Take 1 tablet by mouth daily.  [name]. oxyCODONE-acetaminophen (PERCOCET) 5-325 MG per tablet Take 1 tablet by mouth every 4 (four) hours as needed.    . pantoprazole (PROTONIX) 40 MG tablet Take by mouth.  . potassium chloride (K-DUR) 10 MEQ tablet Take 1 tablet (10 mEq total) by mouth daily. Take with lasix  . rOPINIRole (REQUIP) 2 MG tablet Take 2 mg by mouth at bedtime.   [name]. warfarin (COUMADIN) 2 MG tablet Take 2 tablets (4 mg total) by mouth daily.   No facility-administered encounter medications on  file as of 12/22/2015.    Past Medical History  Diagnosis Date  . Atrial fibrillation, permanent (HCC)     a. CHA2DS2VASc = 4-->coumadin;  b. 09/2014 Echo: EF 55-60%, normal wall motion, mild AI/AS, nl MV, mod dil LA, mildly dil RA, mild to mod TR, PASP 47mmHg.  [name]. Restless leg syndrome   . Pacemaker -Medtronic     a. implant for SSS  . 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 s/p mechanical valve replaced with bovine valve 2010.  [name]. Leukocytosis   . Chronic kidney disease, stage I   . CVA (cerebral infarction)     a. 04/2015 - anticoagulation switched from eliquis to xarelto to coumadin.  [name]. PAD (peripheral artery disease) (HCC)     a. w/ left lower ext claudication s/p ABI's 04/2015 showing nl ABI on Right with abnl waveforms on left sugg of L SFA dzs.    Past Surgical History  Procedure Laterality Date  . Insert / replace / remove pacemaker      implant for SSS  . Mitral valve replacement      s/p mechanical valve replaced with bovine valve 2010  . Melanoma excision      face  . Bladder injection    . Nasal sinus surgery    . Botox bladder      Social History  reports that she quit smoking about 45 years ago. Her smoking use included Cigarettes. She has a 5 pack-year smoking history. She does not have any smokeless tobacco history on file. She reports that she does not drink alcohol or use illicit drugs.  Family History Family history is unknown by patient.   Review of Systems  Constitutional: Negative.   Respiratory: Negative.   Cardiovascular: Positive for leg swelling.  Gastrointestinal: Negative.   Musculoskeletal: Positive for joint swelling and gait problem.  Neurological:       Problems with motor coordination and gait.  Hematological: Negative.   Psychiatric/Behavioral: Negative.   All other systems reviewed and are negative.   BP 140/62 mmHg  Pulse 75  Ht 5\' 9"  (1.753 m)  Wt 177 lb 4 oz (80.4 kg)  BMI 26.16 kg/m2  Physical Exam   Constitutional: She is oriented to person, place, and time. She appears well-developed and well-nourished.  HENT:  Head: Normocephalic.  Nose: Nose normal.  Mouth/Throat: Oropharynx is clear and moist.  Eyes: Conjunctivae are normal. Pupils are equal, round, and reactive to light.  Neck: Normal range of motion. Neck supple. No JVD present.  Cardiovascular: Normal rate, regular rhythm, S1 normal, S2 normal, normal heart sounds and intact distal pulses.  Exam reveals no gallop and no friction rub.   No murmur heard. Minimal leg edema on today's visit, compression hose in place  Pulmonary/Chest: Effort normal and breath sounds normal. No respiratory distress. She has no wheezes. She has no  rales. She exhibits no tenderness.  Abdominal: Soft. Bowel sounds are normal. She exhibits no distension. There is no tenderness.  Musculoskeletal: Normal range of motion. She exhibits no edema or tenderness.  Lymphadenopathy:    She has no cervical adenopathy.  Neurological: She is alert and oriented to person, place, and time. Coordination normal.  Skin: Skin is warm and dry. No rash noted. No erythema.  Psychiatric: She has a normal mood and affect. Her behavior is normal. Judgment and thought content normal.    Assessment and Plan  Nursing note and vitals reviewed.

## 2016-01-08 ENCOUNTER — Ambulatory Visit: Payer: Medicare Other

## 2016-01-08 DIAGNOSIS — I4891 Unspecified atrial fibrillation: Secondary | ICD-10-CM

## 2016-01-08 DIAGNOSIS — R0989 Other specified symptoms and signs involving the circulatory and respiratory systems: Secondary | ICD-10-CM

## 2016-01-21 ENCOUNTER — Ambulatory Visit: Payer: Medicare Other | Admitting: Cardiovascular Disease

## 2016-02-23 ENCOUNTER — Ambulatory Visit: Payer: Medicare Other | Admitting: Cardiovascular Disease

## 2016-03-04 LAB — CUP PACEART INCLINIC DEVICE CHECK
Battery Remaining Longevity: 21 mo
Date Time Interrogation Session: 20170314152736
Implantable Lead Implant Date: 20090317
Implantable Lead Location: 753860
Lead Channel Impedance Value: 0 Ohm
Lead Channel Pacing Threshold Amplitude: 0.5 V
Lead Channel Pacing Threshold Pulse Width: 0.4 ms
MDC IDC MSMT BATTERY IMPEDANCE: 3426 Ohm
MDC IDC MSMT BATTERY VOLTAGE: 2.73 V
MDC IDC MSMT LEADCHNL RV IMPEDANCE VALUE: 485 Ohm
MDC IDC MSMT LEADCHNL RV SENSING INTR AMPL: 8 mV
MDC IDC SET LEADCHNL RV PACING AMPLITUDE: 2.5 V
MDC IDC SET LEADCHNL RV PACING PULSEWIDTH: 0.4 ms
MDC IDC SET LEADCHNL RV SENSING SENSITIVITY: 4 mV
MDC IDC STAT BRADY RV PERCENT PACED: 35 %

## 2016-03-08 ENCOUNTER — Ambulatory Visit (INDEPENDENT_AMBULATORY_CARE_PROVIDER_SITE_OTHER): Payer: Medicare Other | Admitting: *Deleted

## 2016-03-08 DIAGNOSIS — I495 Sick sinus syndrome: Secondary | ICD-10-CM

## 2016-03-08 NOTE — Progress Notes (Signed)
Remote pacemaker transmission.   

## 2016-03-15 LAB — CUP PACEART REMOTE DEVICE CHECK
Battery Impedance: 3745 Ohm
Battery Voltage: 2.72 V
Brady Statistic RV Percent Paced: 39 %
Date Time Interrogation Session: 20170613142526
Implantable Lead Implant Date: 20090317
Lead Channel Impedance Value: 469 Ohm
Lead Channel Pacing Threshold Pulse Width: 0.4 ms
Lead Channel Setting Pacing Pulse Width: 0.4 ms
Lead Channel Setting Sensing Sensitivity: 4 mV
MDC IDC LEAD LOCATION: 753860
MDC IDC MSMT BATTERY REMAINING LONGEVITY: 16 mo
MDC IDC MSMT LEADCHNL RA IMPEDANCE VALUE: 0 Ohm
MDC IDC MSMT LEADCHNL RV PACING THRESHOLD AMPLITUDE: 0.5 V
MDC IDC MSMT LEADCHNL RV SENSING INTR AMPL: 8 mV
MDC IDC SET LEADCHNL RV PACING AMPLITUDE: 2.5 V

## 2016-03-16 ENCOUNTER — Encounter: Payer: Self-pay | Admitting: Cardiology

## 2016-03-31 ENCOUNTER — Encounter: Payer: Self-pay | Admitting: Cardiology

## 2016-04-01 ENCOUNTER — Telehealth: Payer: Self-pay | Admitting: Cardiovascular Disease

## 2016-04-01 NOTE — Telephone Encounter (Signed)
S/w pt who reports bottoms of feet hurting for two months. She has been to podiatrist who removed two calluses. She has f/u appt July 21. Confirms she has pulses.   Trying not to walk around much but has PT twice weekly. Did not go this week d/t pain.  She stopped taking lasix one month as she was not experiencing swelling.  She reports being compliant in elevating legs and wearing compression hose. She would like to see Dr. Kirke CorinArida sooner than next scheduled appt. Advised pt to continue elevating legs and wearing c hose and monitor sx. She is to be seen emergently if pulses are absent. Forward to scheduling

## 2016-04-01 NOTE — Telephone Encounter (Signed)
Patient has been having issues with legs/ feet being cold all the time .  Now she is having trouble walking as she is having a bit of pain.    She is going to podiatrist and make an appt with them first.  Please call to discuss if she needs to move appt up.

## 2016-04-14 ENCOUNTER — Ambulatory Visit: Payer: Medicare Other | Admitting: Cardiovascular Disease

## 2016-04-15 ENCOUNTER — Ambulatory Visit: Payer: Medicare Other | Admitting: Sports Medicine

## 2016-04-29 ENCOUNTER — Encounter: Payer: Self-pay | Admitting: Sports Medicine

## 2016-04-29 ENCOUNTER — Ambulatory Visit (INDEPENDENT_AMBULATORY_CARE_PROVIDER_SITE_OTHER): Payer: Medicare Other | Admitting: Sports Medicine

## 2016-04-29 ENCOUNTER — Other Ambulatory Visit: Payer: Self-pay | Admitting: Cardiovascular Disease

## 2016-04-29 DIAGNOSIS — M79676 Pain in unspecified toe(s): Secondary | ICD-10-CM

## 2016-04-29 DIAGNOSIS — Z8673 Personal history of transient ischemic attack (TIA), and cerebral infarction without residual deficits: Secondary | ICD-10-CM | POA: Diagnosis not present

## 2016-04-29 DIAGNOSIS — B351 Tinea unguium: Secondary | ICD-10-CM

## 2016-04-29 DIAGNOSIS — R2681 Unsteadiness on feet: Secondary | ICD-10-CM

## 2016-04-29 DIAGNOSIS — M6289 Other specified disorders of muscle: Secondary | ICD-10-CM

## 2016-04-29 DIAGNOSIS — R531 Weakness: Secondary | ICD-10-CM

## 2016-04-29 DIAGNOSIS — L84 Corns and callosities: Secondary | ICD-10-CM

## 2016-04-30 NOTE — Progress Notes (Signed)
Subjective: Rhonda Griffith is a 70 y.o. female patient seen today in office with complaint of painful thickened and elongated toenails and callus; unable to trim. Patient denies history of Diabetes or Neuropathy. Has history of Vascular disease and stroke with Left sided weakness desiring PT. Patient has no other pedal complaints at this time.   Patient Active Problem List   Diagnosis Date Noted  . Carotid bruit 12/22/2015  . Hyperlipidemia 10/23/2015  . Atrial fibrillation, permanent ([place])   . Pacemaker -Medtronic   . 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 Chronic kidney disease, unspecified ([place])   . Encounter for anticoagulation discussion and counseling 06/25/2015  . Acute cardioembolic stroke ([place]) 71/69/6789  . PAD (peripheral artery disease) ([place]) 06/25/2015  . Pure hypercholesterolemia 04/30/2015  . History of recent stroke 04/28/2015  . Essential hypertension 12/09/2014  . Gait instability 09/08/2014  . Status post total bilateral knee replacement 06/24/2014  . UTI (urinary tract infection) 02/03/2014  . Fatigue 10/01/2013  . Shortness of breath 09/12/2012  . Stress disorder, acute 09/12/2012  . Tachycardia 09/12/2012  . Atrial fibrillation ([place]) 09/12/2012  . Melanoma in situ ([place]) 07/11/2012  . [place] 07/11/2012  . Long term (current) use of anticoagulants 07/02/2012  . History of malignant melanoma of skin 05/30/2012  . [place] arthritis 02/02/2012  . Sprain of MCL (medial collateral ligament) of knee 01/24/2012  . Squamous cell carcinoma ([place]) 12/21/2011  . Carpal tunnel syndrome of left wrist 11/28/2011  . Sinoatrial node dysfunction (HCC)   . Movement disorder 08/08/2011  . Edema 06/07/2011  . Chronic diastolic heart failure ([place]) 05/27/2011  . Anxiety state 05/14/2011  . Esophageal reflux 05/14/2011  . Mitral valve replaced 05/14/2011  . Myalgia and myositis, unspecified 05/14/2011  . OSA (obstructive sleep apnea) 05/14/2011  .  Osteoarthritis 05/14/2011  . Pulmonary hypertension ([place]) 05/14/2011  . Cardiac pacemaker in situ 10/03/2010    Current Outpatient Prescriptions on File Prior to Visit  Medication Sig Dispense Refill  . albuterol (PROVENTIL HFA;VENTOLIN HFA) 108 (90 BASE) MCG/ACT inhaler Inhale 2 puffs into the lungs every 6 (six) hours as needed for wheezing.    [name] ascorbic acid (VITAMIN C) 500 MG tablet Take 500 mg by mouth daily.    [name] atorvastatin (LIPITOR) 80 MG tablet Take 80 mg by mouth daily.    . calcium-vitamin D (OSCAL-500) 500-400 MG-UNIT per tablet Take 1 tablet by mouth 2 (two) times daily.    . Cholecalciferol (VITAMIN D3) 1000 UNITS CAPS Take 2,000 Units by mouth every 30 (thirty) days.     . cyclobenzaprine (FLEXERIL) 10 MG tablet Take 10 mg by mouth at bedtime.     [name] DIAZEPAM PO Take 5 mg by mouth as needed.     . diclofenac sodium (VOLTAREN) 1 % GEL Apply 1 application topically 2 (two) times daily.      [name] diltiazem (CARDIZEM CD) 120 MG 24 hr capsule Take 1 capsule (120 mg total) by mouth daily. 90 capsule 3  . Docusate Calcium (STOOL SOFTENER PO) Take 1 tablet by mouth 2 (two) times daily.    . DULoxetine (CYMBALTA) 30 MG capsule Take 60 mg by mouth daily.     . fluticasone (FLONASE) 50 MCG/ACT nasal spray Place into both nostrils daily.    . furosemide (LASIX) 20 MG tablet Take 2 tablets (40 mg total) by mouth every other day. 30 tablet 5  . magnesium oxide (MAG-OX) 400 MG tablet Take 400 mg by mouth 2 (two)  times daily.     . meclizine (ANTIVERT) 25 MG tablet Take 25 mg by mouth as needed for dizziness.    . Multiple Vitamin (MULTIVITAMIN) tablet Take 1 tablet by mouth daily.    [name] oxyCODONE-acetaminophen (PERCOCET) 5-325 MG per tablet Take 1 tablet by mouth every 4 (four) hours as needed.      . pantoprazole (PROTONIX) 40 MG tablet Take by mouth.    . potassium chloride (K-DUR) 10 MEQ tablet Take 1 tablet (10 mEq total) by mouth daily. Take with lasix 90 tablet 3  . rOPINIRole (REQUIP) 2  MG tablet Take 2 mg by mouth at bedtime.     [name] warfarin (COUMADIN) 2 MG tablet Take 2 tablets (4 mg total) by mouth daily. 120 tablet 6   No current facility-administered medications on file prior to visit.     Allergies  Allergen Reactions  . Morphine Nausea And Vomiting  . Statins Other (See Comments)    Several different statins.  . Tizanidine Palpitations  . Tape     Allergic to plastic/latex tape. Only use paper tape on patient.  . Cephalexin Rash  . Latex Rash  . Sulfa Antibiotics Rash and Other (See Comments)    Allergic to plastic/latex tape. Only use paper tape on patient.    Objective: Physical Exam  General: Well developed, nourished, no acute distress, awake, alert and oriented x 3, Wheelchair assisted gait  Vascular: Dorsalis pedis artery 1/4 bilateral, Posterior tibial artery 1/4 bilateral, skin temperature warm to warm proximal to distal bilateral lower extremities, + varicosities, diminished pedal hair present bilateral.  Neurological: Gross sensation present via light touch bilateral.   Dermatological: Skin is warm, dry, and supple bilateral, Nails 1-10 are tender, long, thick, and discolored with mild subungal debris, no webspace macerations present bilateral, no open lesions present bilateral, + hyperkeratotic tissue present sub met 1 and 5 bilateral. No signs of infection bilateral.  Musculoskeletal: Asymptomatic hammertoe boney deformities noted bilateral. Pes cavus bilateral. Left sided weakness. No pain with calf compression bilateral.  Gait disturbance secondary to stroke. No recent falls.   Assessment and Plan:  Problem List Items Addressed This Visit      Other   Gait instability    Other Visit Diagnoses    Dermatophytosis of nail    -  Primary   Callus       History of stroke       Left-sided weakness          -Examined patient.  -Discussed treatment options for painful mycotic nails and callus with gait instability hx of  stroke. -Mechanically debrided callus using sterile blade and reduced mycotic nails with sterile nail nipper and dremel nail file without incident. -Rx PT at [facility] in [place] for strengthening, range of motion, and gait -Continue with wheelchair for assistance -Continue with good supportive shoes -Patient to return in 3 months for follow up evaluation or sooner if symptoms worsen.  Landis Martins, DPM

## 2016-05-23 ENCOUNTER — Ambulatory Visit: Payer: Medicare Other | Admitting: Cardiovascular Disease

## 2016-06-05 IMAGING — CR DG CHEST 1V PORT
1 series · 1 of 1 positions shown · non-contrast
Comparison: 11/09/2014

CLINICAL DATA: Shortness of Breath

EXAM:
PORTABLE CHEST 1 VIEW

[ap]
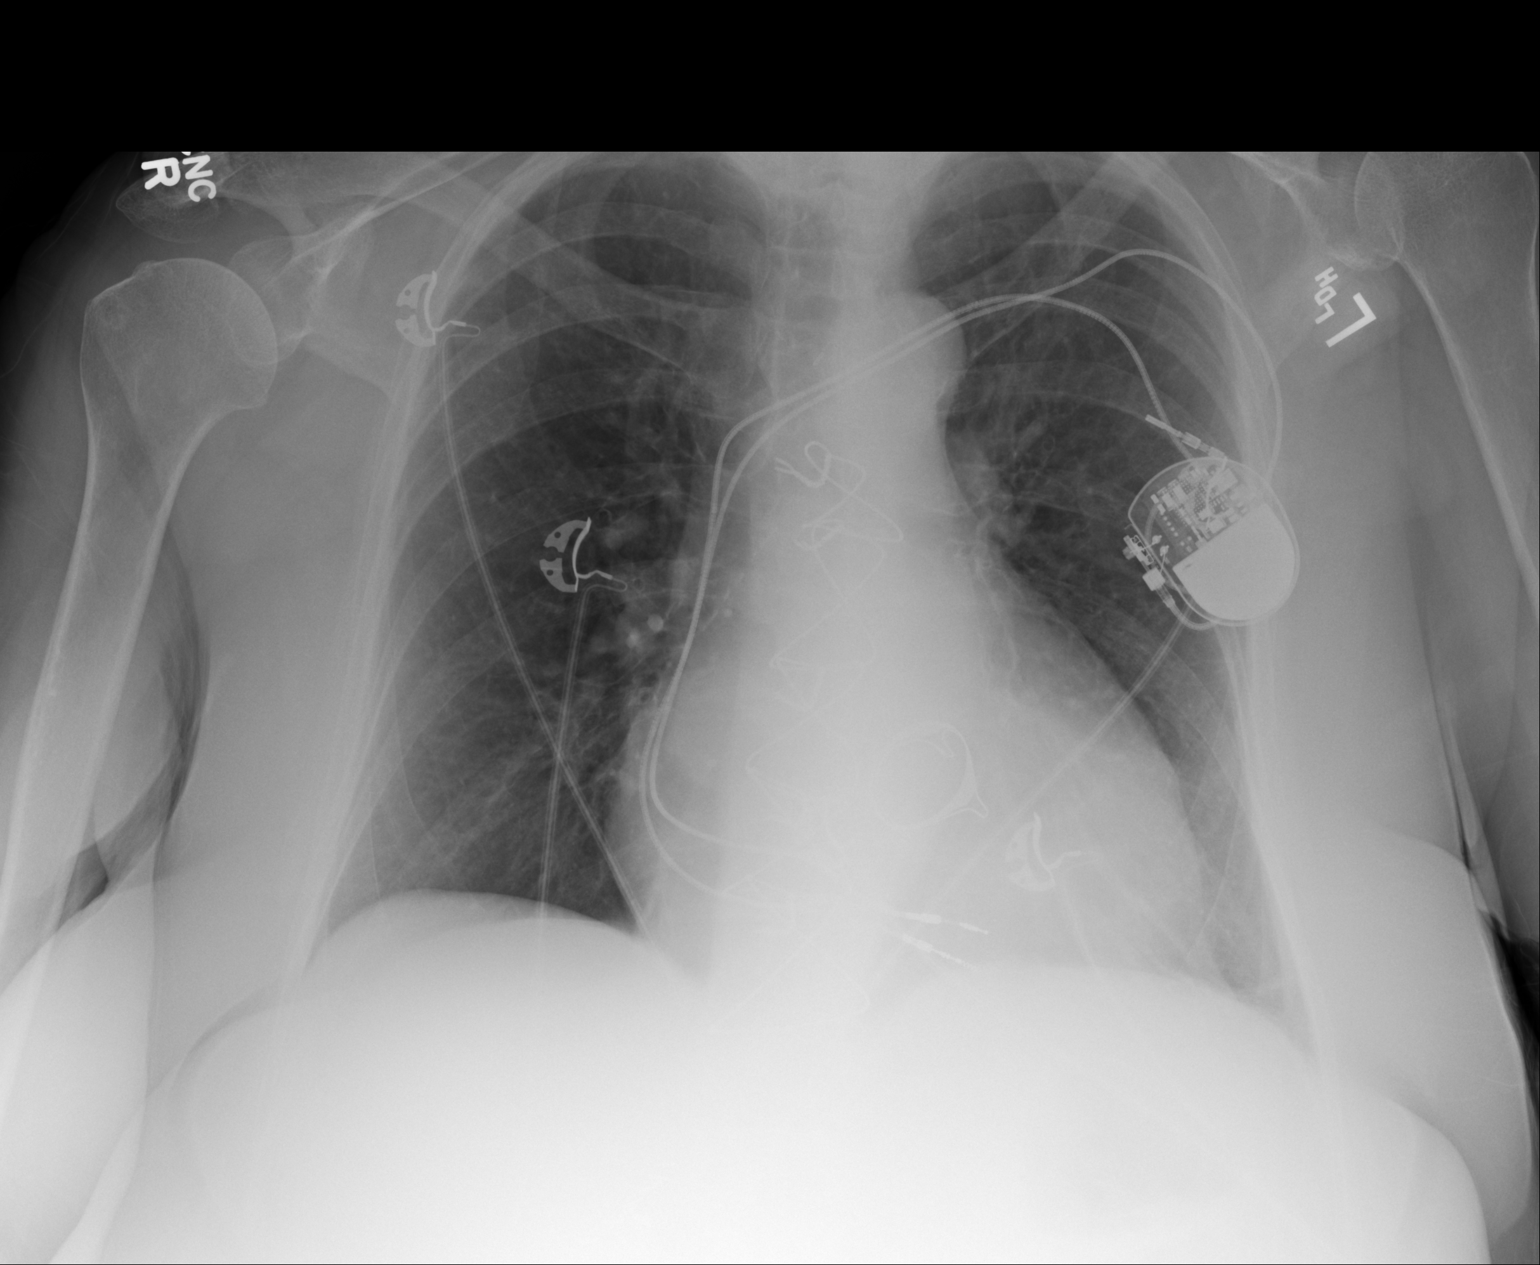

[1 of 1 positions shown; findings below may reference images not displayed]

FINDINGS: Cardiomediastinal silhouette is stable. Status post median
sternotomy and cardiac valve replacement. Two leads cardiac
pacemaker are unchanged in position within right ventricle. No acute
infiltrate or pulmonary edema.
IMPRESSION: No active disease.  No significant change.

## 2016-06-05 IMAGING — CR DG ANKLE COMPLETE 3+V*L*
1 series · 3 of 3 positions shown · non-contrast
Comparison: None.

CLINICAL DATA: Pain and swelling.  No history of recent trauma.

EXAM:
LEFT ANKLE COMPLETE - 3+ VIEW

[Series 1: ap · 0.17mm/px · 3 of 3 slices shown]
[im 1/3]
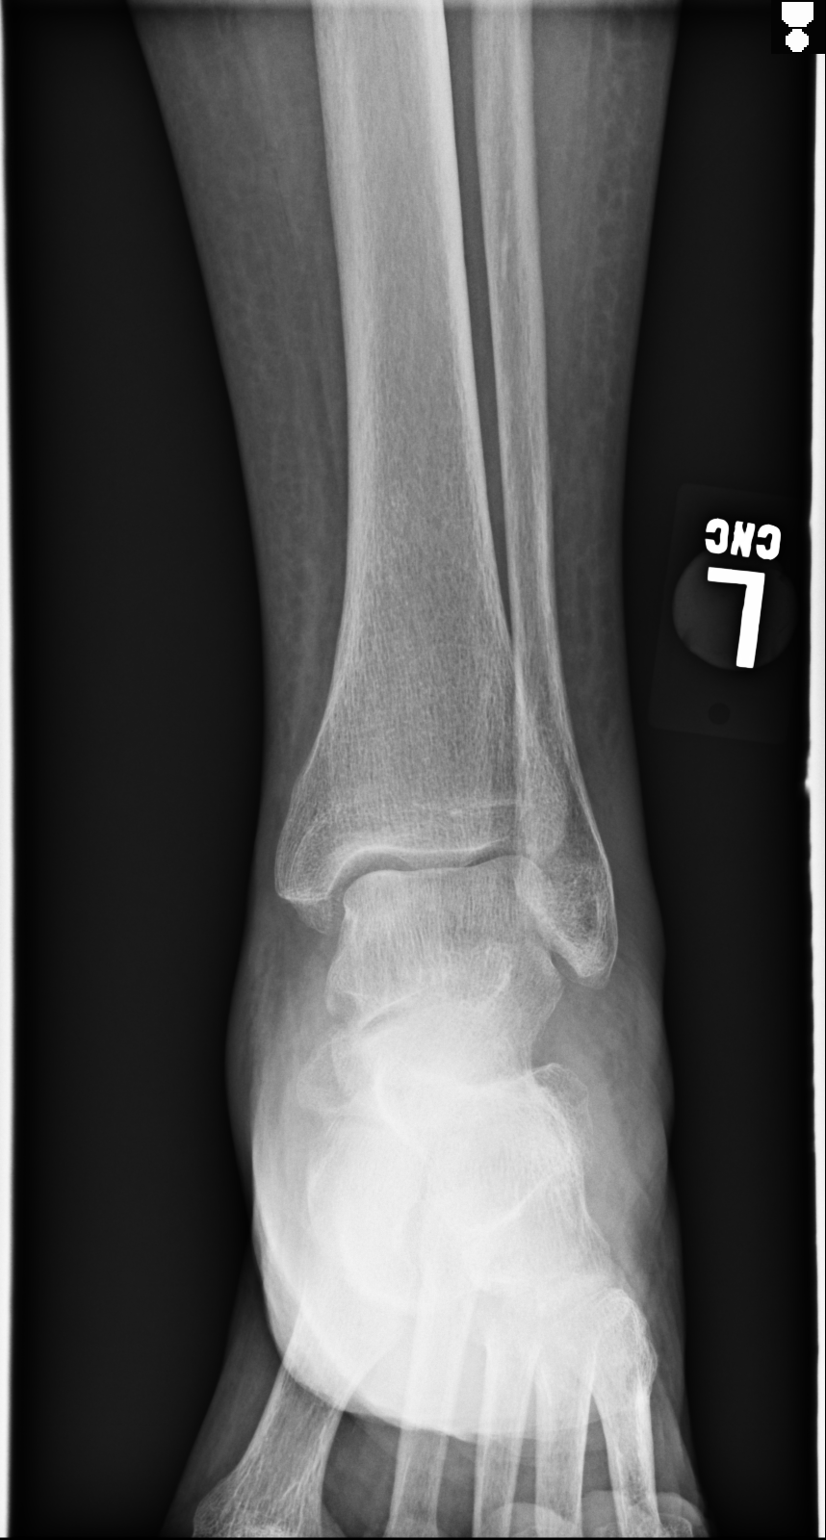
[im 2/3]
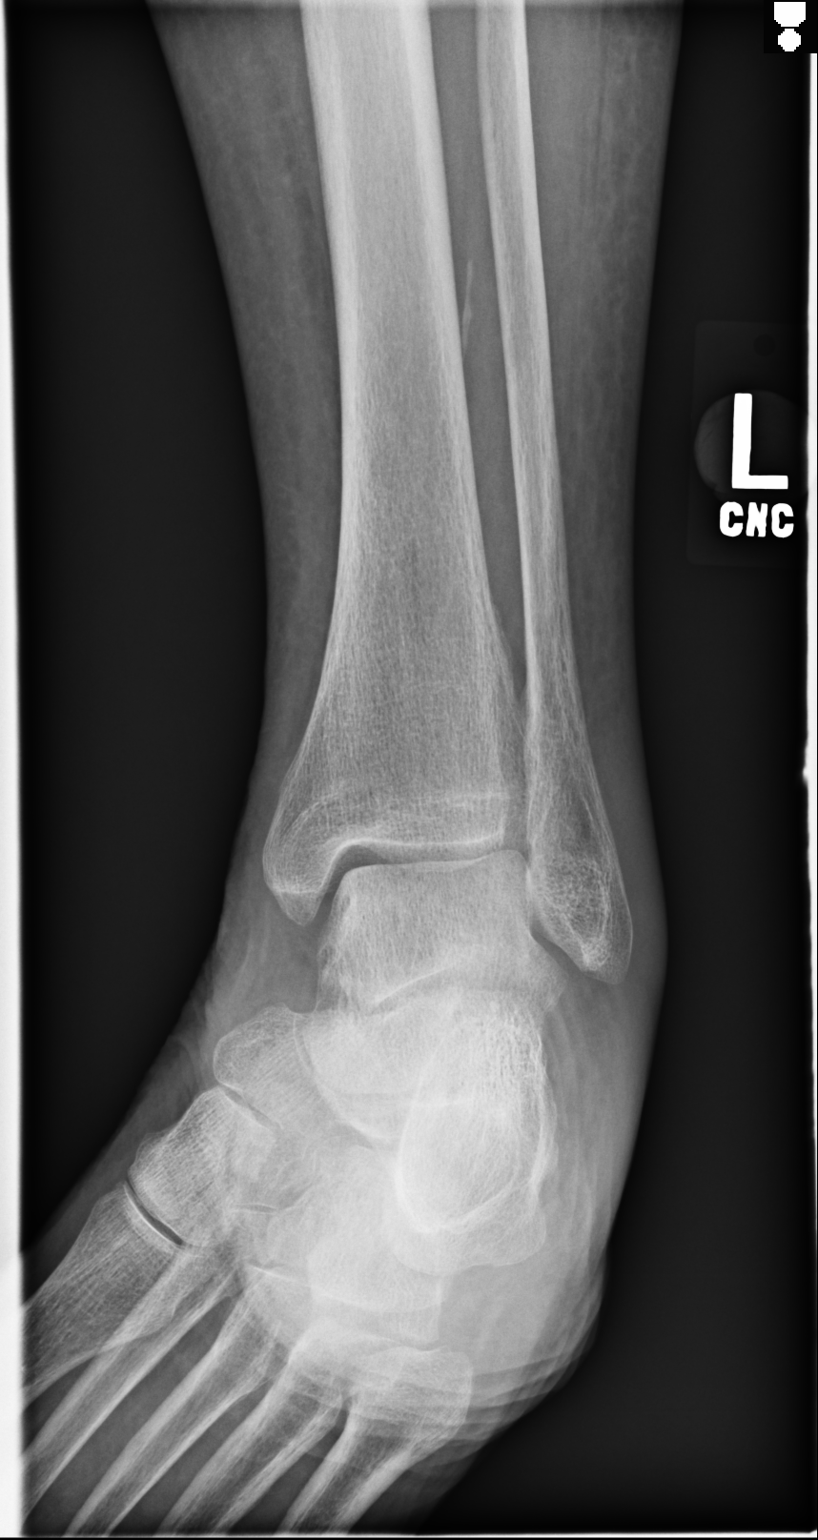
[im 3/3]
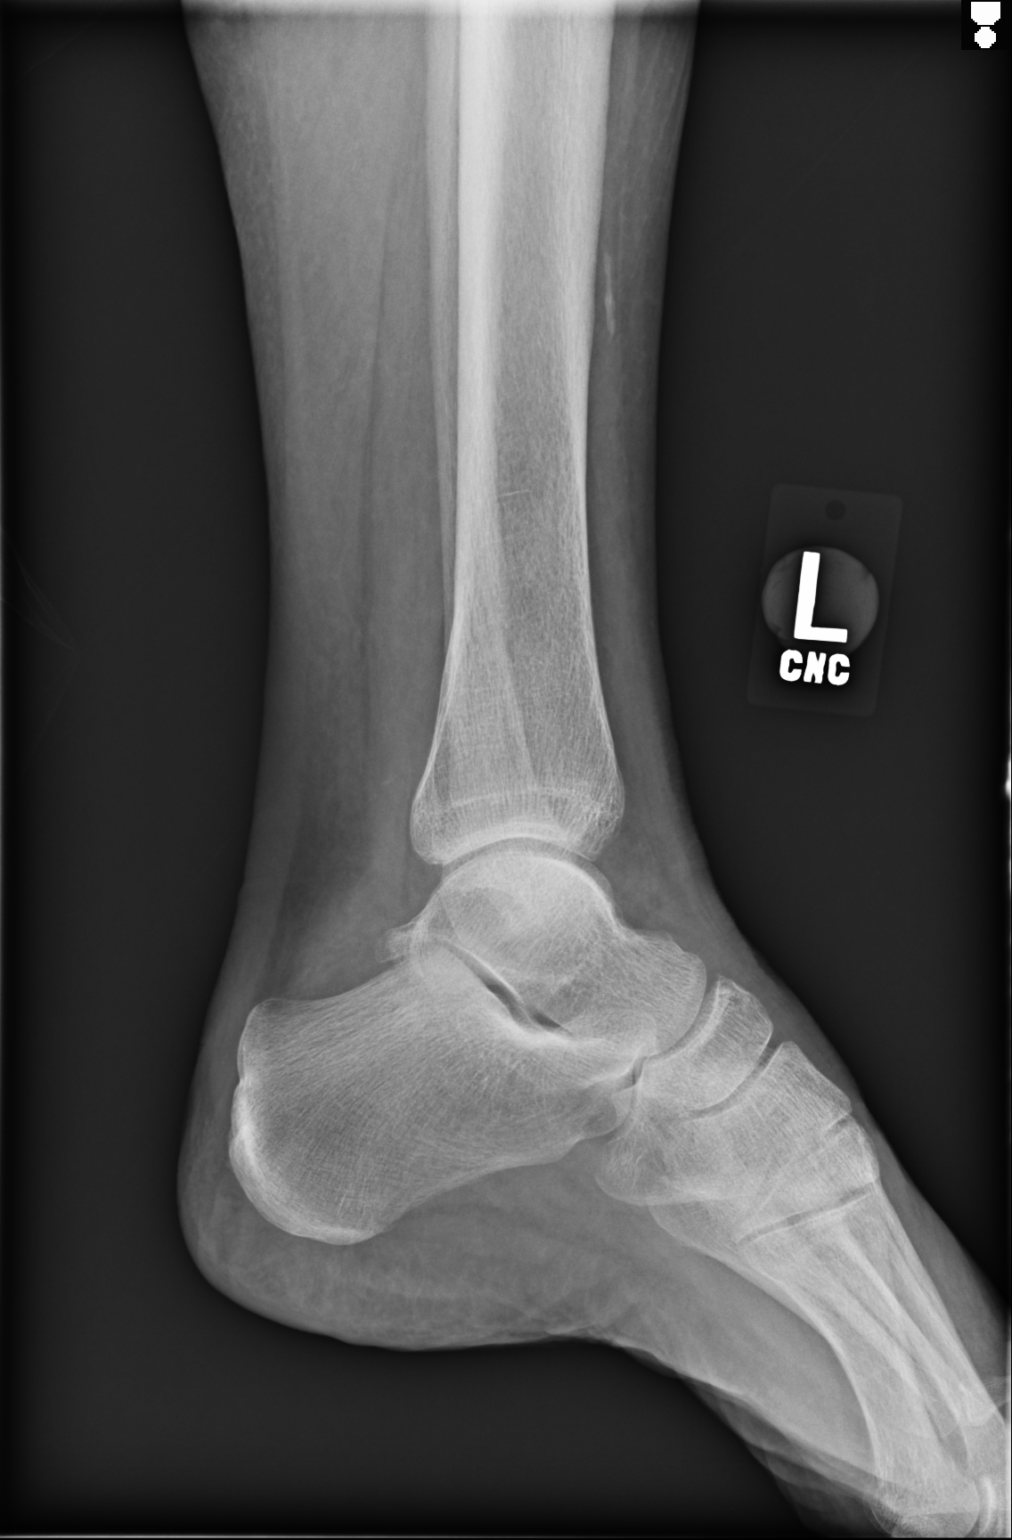

[3 of 3 positions shown; findings below may reference images not displayed]

FINDINGS: Frontal, oblique, and lateral views were obtained. There is soft
tissue swelling. There is no demonstrable fracture or joint
effusion. The ankle mortise appears intact. There is no appreciable
joint space narrowing or erosion.
IMPRESSION: Soft tissue swelling. No fracture. Mortise intact. No appreciable
arthropathic change.

## 2016-06-07 ENCOUNTER — Telehealth: Payer: Self-pay | Admitting: Cardiology

## 2016-06-07 ENCOUNTER — Ambulatory Visit (INDEPENDENT_AMBULATORY_CARE_PROVIDER_SITE_OTHER): Payer: Medicare Other | Admitting: *Deleted

## 2016-06-07 DIAGNOSIS — I495 Sick sinus syndrome: Secondary | ICD-10-CM

## 2016-06-07 NOTE — Telephone Encounter (Signed)
LMOVM reminding pt to send remote transmission.   

## 2016-06-07 NOTE — Progress Notes (Signed)
Remote pacemaker transmission.   

## 2016-06-09 ENCOUNTER — Encounter: Payer: Self-pay | Admitting: Cardiology

## 2016-06-16 LAB — CUP PACEART REMOTE DEVICE CHECK
Battery Remaining Longevity: 16 mo
Implantable Lead Implant Date: 20090317
Implantable Lead Location: 753860
Lead Channel Impedance Value: 0 Ohm
Lead Channel Setting Pacing Amplitude: 2.5 V
Lead Channel Setting Sensing Sensitivity: 2.8 mV
MDC IDC MSMT BATTERY IMPEDANCE: 3868 Ohm
MDC IDC MSMT BATTERY VOLTAGE: 2.7 V
MDC IDC MSMT LEADCHNL RV IMPEDANCE VALUE: 480 Ohm
MDC IDC MSMT LEADCHNL RV PACING THRESHOLD AMPLITUDE: 0.5 V
MDC IDC MSMT LEADCHNL RV PACING THRESHOLD PULSEWIDTH: 0.4 ms
MDC IDC SESS DTM: 20170912155850
MDC IDC SET LEADCHNL RV PACING PULSEWIDTH: 0.4 ms
MDC IDC STAT BRADY RV PERCENT PACED: 38 %

## 2016-06-23 ENCOUNTER — Encounter: Payer: Self-pay | Admitting: Cardiology

## 2016-06-24 ENCOUNTER — Other Ambulatory Visit: Payer: Self-pay

## 2016-06-24 ENCOUNTER — Ambulatory Visit: Payer: Medicare Other | Admitting: Cardiovascular Disease

## 2016-06-28 ENCOUNTER — Telehealth: Payer: Self-pay | Admitting: Cardiovascular Disease

## 2016-06-28 ENCOUNTER — Encounter: Payer: Self-pay | Admitting: Cardiovascular Disease

## 2016-06-28 NOTE — Telephone Encounter (Signed)
*  STAT* If patient is at the pharmacy, call can be transferred to refill team.   1. Which medications need to be refilled? (please list name of each medication and dose if known) furosemide (LASIX) 20 MG tablet  2. Which pharmacy/location (including street and city if local pharmacy) is medication to be sent to? [facility] (mail order)  Tele # [phone]  3. Do they need a 30 day or 90 day supply? 90 day

## 2016-06-29 ENCOUNTER — Other Ambulatory Visit: Payer: Self-pay | Admitting: *Deleted

## 2016-06-29 MED ORDER — FUROSEMIDE 20 MG PO TABS: 40.0000 mg | ORAL_TABLET | ORAL | 3 refills | Status: DC

## 2016-06-29 NOTE — Telephone Encounter (Signed)
Requested Prescriptions   Signed Prescriptions Disp Refills  . furosemide (LASIX) 20 MG tablet 90 tablet 3    Sig: Take 2 tablets (40 mg total) by mouth every other day.    Authorizing Provider: KLEIN, STEVEN C    Ordering User: LOPEZ, MARINA C    

## 2016-06-29 NOTE — Telephone Encounter (Signed)
Furosemide 20 mg #90 R#3 sent to [facility].

## 2016-07-03 NOTE — Patient Instructions (Signed)
Erroneous encounter

## 2016-07-03 NOTE — Progress Notes (Signed)
Erroneous encounter

## 2016-07-04 ENCOUNTER — Other Ambulatory Visit: Payer: Self-pay | Admitting: Cardiovascular Disease

## 2016-07-04 NOTE — Telephone Encounter (Signed)
Please review for refill. Thanks!  

## 2016-07-18 ENCOUNTER — Ambulatory Visit: Payer: Medicare Other | Admitting: Cardiovascular Disease

## 2016-08-05 ENCOUNTER — Encounter: Payer: Self-pay | Admitting: Podiatry

## 2016-08-05 ENCOUNTER — Ambulatory Visit (INDEPENDENT_AMBULATORY_CARE_PROVIDER_SITE_OTHER): Payer: Medicare Other | Admitting: Podiatry

## 2016-08-05 DIAGNOSIS — L608 Other nail disorders: Secondary | ICD-10-CM

## 2016-08-05 DIAGNOSIS — L603 Nail dystrophy: Secondary | ICD-10-CM

## 2016-08-05 DIAGNOSIS — L84 Corns and callosities: Secondary | ICD-10-CM

## 2016-08-05 DIAGNOSIS — B351 Tinea unguium: Secondary | ICD-10-CM

## 2016-08-05 DIAGNOSIS — M79676 Pain in unspecified toe(s): Secondary | ICD-10-CM

## 2016-08-05 DIAGNOSIS — M79609 Pain in unspecified limb: Secondary | ICD-10-CM

## 2016-08-09 ENCOUNTER — Ambulatory Visit (INDEPENDENT_AMBULATORY_CARE_PROVIDER_SITE_OTHER): Payer: Medicare Other | Admitting: Cardiovascular Disease

## 2016-08-09 ENCOUNTER — Encounter: Payer: Self-pay | Admitting: Cardiovascular Disease

## 2016-08-09 VITALS — BP 164/84 | HR 79 | Ht 69.0 in | Wt 173.8 lb

## 2016-08-09 DIAGNOSIS — I080 Rheumatic disorders of both mitral and aortic valves: Secondary | ICD-10-CM | POA: Diagnosis not present

## 2016-08-09 DIAGNOSIS — I10 Essential (primary) hypertension: Secondary | ICD-10-CM

## 2016-08-09 DIAGNOSIS — I4891 Unspecified atrial fibrillation: Secondary | ICD-10-CM | POA: Diagnosis not present

## 2016-08-09 DIAGNOSIS — I5032 Chronic diastolic (congestive) heart failure: Secondary | ICD-10-CM | POA: Diagnosis not present

## 2016-08-09 DIAGNOSIS — E7800 Pure hypercholesterolemia, unspecified: Secondary | ICD-10-CM

## 2016-08-09 DIAGNOSIS — Z952 Presence of prosthetic heart valve: Secondary | ICD-10-CM

## 2016-08-09 DIAGNOSIS — I739 Peripheral vascular disease, unspecified: Secondary | ICD-10-CM

## 2016-08-09 MED ORDER — POTASSIUM CHLORIDE ER 10 MEQ PO TBCR: 10.0000 meq | EXTENDED_RELEASE_TABLET | Freq: Every day | ORAL | 3 refills | Status: DC | PRN

## 2016-08-09 NOTE — Patient Instructions (Addendum)
[place] [facility] [place]:  [facility] [address], [address] [place], [place] [address] [phone]  Number for orthopedics: [facility] [place] Office [phone] [address] [place], [place] [address]  Medication Instructions:   Rip HarbourOk to take potassium a few days a week  Monitor your blood pressure at home  Labwork:  No new labs needed  Testing/Procedures:  No further testing at this time  Echo next year for bioprosthetic mitral valve, aortic valve stenosis   I recommend watching educational videos on topics of interest to you at:       [email or web address]  Enter code: [email or web address]    Follow-Up: It was a pleasure seeing you in the office today. Please call us if you have new issues that need to be addressed before your next appt.  [phone]  Your physician wants you to follow-up in: 6 months.  You will receive a reminder letter in the mail two months in advance. If you don't receive a letter, please call our office to schedule the follow-up appointment.  If you need a refill on your cardiac medications before your next appointment, please call your pharmacy.

## 2016-08-09 NOTE — Progress Notes (Signed)
Cardiology Office Note  Date:  08/09/2016   ID:  Murna, Backer 05/22/1946, MRN [id number]  PCP:  [name], MD   Chief Complaint  Patient presents with  . other    6 month follow up. "doing well." Pt. c/o cramping in legs.     HPI:  Ms. 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 History of bilateral knee replacements She presents today for follow-up of her atrial fibrillation, stroke (Apr 28, 2015) History of sleep apnea, not on CPAP  In follow-up today, she reports that she is not on lasix, only takes as needed for ankle swelling Stopped her lipitor (cramps) Taking potassium for cramps (ran out over one month ago, wants more) Weight dropping, does not eat lunch, down 20 pounds in the past year Napping a lot, often sleeps through lunch Frequently tearful through her visit today, misses her husband who passed away 1 year ago  Denies any leg swelling No longer wears her compression hose Continued gait instability Denies any symptoms concerning for claudication  EKG showing V paced rhythm, rate 79 bpm  Other past medical history reviewed History of heavy vaginal bleeding, none recently Chronic left lower extremity toe drop, foot drop.  Previously had shortness of breath, improved with Lasix.  spends significant time playing on the computer  She does take a pain pill occasionally to sleep better.  Valium for anxiety. severe bilateral knee pain.  Statin was held in the past for leg pain, Zocor.  Previous spinal tap at [facility] for high pressure in her ventricle. She reports no  significant improvement after this procedure (drain?).  Placement of a bladder stimulator implant for overactive bladder was denied despite cardiac clearance . 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  PSH:    Past Surgical History:  Procedure Laterality Date  . bladder injection    . botox bladder    . INSERT / REPLACE / REMOVE PACEMAKER     implant for SSS  . MELANOMA EXCISION     face  . MITRAL VALVE REPLACEMENT     s/p mechanical valve replaced with bovine valve 2010  . NASAL SINUS SURGERY      Current Outpatient Prescriptions  Medication Sig Dispense Refill  . albuterol (PROVENTIL HFA;VENTOLIN HFA) 108 (90 BASE) MCG/ACT inhaler Inhale 2 puffs into the lungs every 6 (six) hours as needed for wheezing.    [name] ascorbic acid (VITAMIN C) 500 MG tablet Take 500 mg by mouth daily.    [name] atorvastatin (LIPITOR) 80 MG tablet Take 80 mg by mouth daily.    . calcium-vitamin D (OSCAL-500) 500-400 MG-UNIT per tablet Take 1 tablet by mouth 2 (two) times daily.    . Cholecalciferol (VITAMIN D3) 1000 UNITS CAPS Take 2,000 Units by mouth every 30 (thirty) days.     . cyclobenzaprine (FLEXERIL) 10 MG tablet Take 10 mg by mouth at  bedtime.     [name]. DIAZEPAM PO Take 5 mg by mouth as needed.     . diclofenac sodium (VOLTAREN) 1 % GEL Apply 1 application topically 2 (two) times daily.      [name]. diltiazem (CARDIZEM CD) 120 MG 24 hr capsule Take 1 capsule (120 mg total) by mouth daily. 90 capsule 3  . Docusate Calcium (STOOL SOFTENER PO) Take 1 tablet by mouth 2 (two) times daily.    . DULoxetine (CYMBALTA) 30 MG capsule Take 60 mg by mouth daily.     . fluticasone (FLONASE) 50 MCG/ACT nasal spray Place into both nostrils  daily.    . furosemide (LASIX) 20 MG tablet Take 2 tablets (40 mg total) by mouth every other day. 90 tablet 3  . magnesium oxide (MAG-OX) 400 MG tablet Take 400 mg by mouth 2 (two) times daily.     . meclizine (ANTIVERT) 25 MG tablet Take 25 mg by mouth as needed for dizziness.    . Multiple Vitamin (MULTIVITAMIN) tablet Take 1 tablet by mouth daily.    [name]. oxyCODONE-acetaminophen (PERCOCET) 5-325 MG per tablet Take 1 tablet by mouth every 4 (four) hours as needed.      . pantoprazole (PROTONIX) 40 MG tablet Take by mouth.    . potassium chloride (K-DUR) 10 MEQ tablet Take 1 tablet (10 mEq total) by mouth daily as needed. Take with lasix 90 tablet 3  . rOPINIRole (REQUIP) 2 MG tablet Take 2 mg by mouth at bedtime.     [name]. warfarin (COUMADIN) 2 MG tablet Take 2 tablets (4 mg total) by mouth daily. 120 tablet 6   No current facility-administered medications for this visit.      Allergies:   Morphine; Statins; Tizanidine; Tape; Cephalexin; Latex; and Sulfa antibiotics   Social History:  The patient  reports that she quit smoking about 45 years ago. Her smoking use included Cigarettes. She has a 5.00 pack-year smoking history. She has never used smokeless tobacco. She reports that she does not drink alcohol or use drugs.   Family History:   Family history is unknown by patient.    Review of Systems: Review of Systems  Constitutional: Positive for weight loss.  Respiratory: Negative.   Cardiovascular: Negative.   Gastrointestinal: Negative.   Musculoskeletal: Positive for myalgias.       Gait instability  Neurological: Negative.   Psychiatric/Behavioral: Negative.   All other systems reviewed and are negative.    PHYSICAL EXAM: VS:  BP (!) 164/84 (BP Location: Left Arm, Patient Position: Sitting, Cuff Size: Normal)   Pulse 79   Ht 5\' 9"  (1.753 m)   Wt 173 lb 12 oz (78.8 kg)   BMI 25.66 kg/m  , BMI Body mass index is 25.66 kg/m. GEN: Well nourished, well developed, in no acute  distress  HEENT: normal  Neck: no JVD, carotid bruits, or masses Cardiac: RRR; no murmurs, rubs, or gallops,no edema  Respiratory:  clear to auscultation bilaterally, normal work of breathing GI: soft, nontender, nondistended, + BS MS: no deformity or atrophy  Skin: warm and dry, no rash Neuro:  Strength and sensation are intact Psych: euthymic mood, full affect    Recent Labs: 09/01/2015: ALT 16; B Natriuretic Peptide 60.0; BUN 21; Creatinine, Ser 1.27; Hemoglobin 11.9; Platelets 252; Potassium 3.4; Sodium 141    Lipid Panel No results found for: CHOL, HDL, LDLCALC, TRIG    Wt Readings from Last 3 Encounters:  08/09/16 173 lb 12 oz (78.8 kg)  12/22/15  177 lb 4 oz (80.4 kg)  12/11/15 182 lb 8 oz (82.8 kg)       ASSESSMENT AND PLAN:  Atrial fibrillation, unspecified type (HCC) - Plan: EKG 12-Lead On anticoagulation, the paced rhythm No recent falls per the patient  Chronic diastolic heart failure (HCC) - Plan: EKG 12-Lead She has stopped her Lasix on her own, No leg edema on today's visit, appears euvolemic Recommended she take Lasix for any ankle swelling, shortness of breath  Essential hypertension - Plan: EKG 12-Lead Blood pressure elevated today. 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   Mitral valve replaced Repeat echocardiogram next year at her convenience She has declined checking this year   Total encounter time more than 25 minutes  Greater than 50% was spent in counseling and coordination of care with the patient   Disposition:   F/U  6  months   Orders Placed This Encounter  Procedures  . EKG 12-Lead     Signed, Dossie Arbourim Gollan, M.D., Ph.D. 08/09/2016  [facility], [place] [phone]

## 2016-08-11 ENCOUNTER — Telehealth: Payer: Self-pay | Admitting: Cardiovascular Disease

## 2016-08-11 NOTE — Telephone Encounter (Signed)
Left message for pt to call back  °

## 2016-08-11 NOTE — Telephone Encounter (Signed)
Spoke w/ pt.  She reports this am around 11am, BP 179/91. She checked again a few mins later 91/64. She is unsure if BP cuff is correct. She recently started new med for her bladder: myrbetriq - side effect is elevated BP. Asked her to continue to monitor and call on Monday w/ readings.

## 2016-08-11 NOTE — Telephone Encounter (Signed)
Pt c/o BP issue: STAT if pt c/o blurred vision, one-sided weakness or slurred speech  1. What are your last 5 BP readings?  Pt states just a little while ago her BP was 179/91    2. Are you having any other symptoms (ex. Dizziness, headache, blurred vision, passed out)? no  3. What is your BP issue? elevated

## 2016-08-13 NOTE — Progress Notes (Signed)
SUBJECTIVE Patient presents to office today complaining of elongated, thickened nails. Pain while ambulating in shoes. Patient is unable to trim their own nails.   Allergies  Allergen Reactions  . Morphine Nausea And Vomiting  . Statins Other (See Comments)    Several different statins.  . Tizanidine Palpitations  . Tape     Allergic to plastic/latex tape. Only use paper tape on patient.  . Cephalexin Rash  . Latex Rash  . Sulfa Antibiotics Rash and Other (See Comments)    Allergic to plastic/latex tape. Only use paper tape on patient.    OBJECTIVE General Patient is awake, alert, and oriented x 3 and in no acute distress. Derm Painful hyperkeratotic lesions noted to the bilateral fifth MPJs plantar. Skin is dry and supple bilateral. Negative open lesions or macerations. Remaining integument unremarkable. Nails are tender, long, thickened and dystrophic with subungual debris, consistent with onychomycosis, 1-5 bilateral. No signs of infection noted. Vasc  DP and PT pedal pulses palpable bilaterally. Temperature gradient within normal limits.  Neuro Epicritic and protective threshold sensation diminished bilaterally.  Musculoskeletal Exam No symptomatic pedal deformities noted bilateral. Muscular strength within normal limits.  ASSESSMENT 1. Onychodystrophy of nails 2. Onychomycosis of nail due to dermatophyte bilateral 3. Pain in foot bilateral 4. Porokeratosis bilateral fifth MPJs painful, plantar  PLAN OF CARE 1. Patient evaluated today. 2. Instructed to maintain good pedal hygiene and foot care. Stressed importance of controlling blood sugar.  3. Mechanical debridement of nails 1-5 bilaterally performed using a nail nipper. Filed with dremel without incident.  4. Excisional debridement of porokeratotic lesions was performed using a chisel blade without incident. 5. Return to clinic in 3 mos.    Felecia ShellingBrent M Evans, DPM

## 2016-09-05 ENCOUNTER — Other Ambulatory Visit: Payer: Self-pay

## 2016-09-05 MED ORDER — FUROSEMIDE 20 MG PO TABS: 40.0000 mg | ORAL_TABLET | ORAL | 3 refills | Status: DC

## 2016-09-05 MED ORDER — POTASSIUM CHLORIDE ER 10 MEQ PO TBCR: 10.0000 meq | EXTENDED_RELEASE_TABLET | Freq: Every day | ORAL | 3 refills | Status: DC | PRN

## 2016-09-05 MED ORDER — DILTIAZEM HCL ER COATED BEADS 120 MG PO CP24: 120.0000 mg | ORAL_CAPSULE | Freq: Every day | ORAL | 3 refills | Status: DC

## 2016-09-06 ENCOUNTER — Telehealth: Payer: Self-pay | Admitting: Cardiology

## 2016-09-06 ENCOUNTER — Other Ambulatory Visit: Payer: Self-pay | Admitting: *Deleted

## 2016-09-06 ENCOUNTER — Ambulatory Visit (INDEPENDENT_AMBULATORY_CARE_PROVIDER_SITE_OTHER): Payer: Medicare Other | Admitting: *Deleted

## 2016-09-06 DIAGNOSIS — I495 Sick sinus syndrome: Secondary | ICD-10-CM

## 2016-09-06 MED ORDER — FUROSEMIDE 20 MG PO TABS: 40.0000 mg | ORAL_TABLET | ORAL | 3 refills | Status: DC

## 2016-09-06 MED ORDER — DILTIAZEM HCL ER COATED BEADS 120 MG PO CP24: 120.0000 mg | ORAL_CAPSULE | Freq: Every day | ORAL | 3 refills | Status: DC

## 2016-09-06 MED ORDER — POTASSIUM CHLORIDE ER 10 MEQ PO TBCR
10.0000 meq | EXTENDED_RELEASE_TABLET | Freq: Every day | ORAL | 3 refills | Status: DC | PRN
Start: 2016-09-06 — End: 2019-09-26

## 2016-09-06 NOTE — Telephone Encounter (Signed)
Requested Prescriptions   Signed Prescriptions Disp Refills  . furosemide (LASIX) 20 MG tablet 90 tablet 3    Sig: Take 2 tablets (40 mg total) by mouth every other day.    Authorizing Provider: Duke SalviaKLEIN, STEVEN C    Ordering User: Kendrick FriesLOPEZ, MARINA C

## 2016-09-06 NOTE — Telephone Encounter (Signed)
Requested Prescriptions   Signed Prescriptions Disp Refills  . diltiazem (CARDIZEM CD) 120 MG 24 hr capsule 90 capsule 3    Sig: Take 1 capsule (120 mg total) by mouth daily.    Authorizing Provider: Antonieta IbaGOLLAN, TIMOTHY J    Ordering User: Shawnie DapperLOPEZ, Jannette Cotham C  . potassium chloride (K-DUR) 10 MEQ tablet 90 tablet 3    Sig: Take 1 tablet (10 mEq total) by mouth daily as needed. Take with lasix    Authorizing Provider: Antonieta IbaGOLLAN, TIMOTHY J    Ordering User: Kendrick FriesLOPEZ, Jru Pense C

## 2016-09-06 NOTE — Telephone Encounter (Signed)
LMOVM reminding pt to send remote transmission.   

## 2016-09-07 ENCOUNTER — Telehealth: Payer: Self-pay | Admitting: Internal Medicine

## 2016-09-07 NOTE — Telephone Encounter (Signed)
Pt would like to know if her transmission was received. Please call and advise.

## 2016-09-07 NOTE — Progress Notes (Signed)
Remote pacemaker transmission.   

## 2016-09-08 NOTE — Telephone Encounter (Signed)
LVM informing pt that we did receive her remote transmission.

## 2016-09-14 ENCOUNTER — Encounter: Payer: Self-pay | Admitting: Cardiology

## 2016-09-21 LAB — CUP PACEART REMOTE DEVICE CHECK
Battery Impedance: 4032 Ohm
Battery Remaining Longevity: 14 mo
Battery Voltage: 2.71 V
Brady Statistic RV Percent Paced: 46 %
Implantable Lead Implant Date: 20090317
Implantable Lead Model: 5076
Implantable Pulse Generator Implant Date: 20090317
Lead Channel Pacing Threshold Pulse Width: 0.4 ms
Lead Channel Setting Pacing Amplitude: 2.5 V
Lead Channel Setting Pacing Pulse Width: 0.4 ms
Lead Channel Setting Sensing Sensitivity: 4 mV
MDC IDC LEAD LOCATION: 753860
MDC IDC MSMT LEADCHNL RA IMPEDANCE VALUE: 0 Ohm
MDC IDC MSMT LEADCHNL RV IMPEDANCE VALUE: 469 Ohm
MDC IDC MSMT LEADCHNL RV PACING THRESHOLD AMPLITUDE: 0.5 V
MDC IDC MSMT LEADCHNL RV SENSING INTR AMPL: 8 mV
MDC IDC SESS DTM: 20171212213929

## 2016-09-30 ENCOUNTER — Encounter: Payer: Self-pay | Admitting: Cardiology

## 2016-10-14 DIAGNOSIS — Z950 Presence of cardiac pacemaker: Secondary | ICD-10-CM | POA: Insufficient documentation

## 2016-10-14 DIAGNOSIS — G255 Other chorea: Secondary | ICD-10-CM | POA: Insufficient documentation

## 2016-10-14 DIAGNOSIS — I639 Cerebral infarction, unspecified: Secondary | ICD-10-CM | POA: Insufficient documentation

## 2016-10-14 DIAGNOSIS — J101 Influenza due to other identified influenza virus with other respiratory manifestations: Secondary | ICD-10-CM | POA: Insufficient documentation

## 2016-10-14 DIAGNOSIS — J9601 Acute respiratory failure with hypoxia: Secondary | ICD-10-CM | POA: Insufficient documentation

## 2016-10-21 ENCOUNTER — Telehealth: Payer: Self-pay | Admitting: Cardiovascular Disease
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
## 2016-10-21 NOTE — Telephone Encounter (Signed)
Pt states she is still getting cramps in her legs. States she took one already today, but is still getting them . She asks if she can take another one.  States she took the 1st one at 10:00. Please call.

## 2016-10-23 NOTE — Telephone Encounter (Signed)
Would take her regular doses of potassium Recent level in the hospital when she had flu was normal 3.9 to 4

## 2016-10-24 NOTE — Telephone Encounter (Signed)
Spoke w/ pt.  Advised her of Dr. Windell HummingbirdGollan's recommendation.  She will contact her PCP to discuss other causes of her cramps and have labs done.  Asked her to call back w/ any further questions or concerns.

## 2016-11-04 ENCOUNTER — Telehealth: Payer: Self-pay

## 2016-11-04 NOTE — Telephone Encounter (Signed)
Spoke with pt and informed her that her next remote transmission date will be after she see's MD on 12-27-16. Pt verbalized understanding.

## 2016-11-04 NOTE — Telephone Encounter (Signed)
Received voice mail message from patient.  She would like to know the date of next pacemaker check. She requested return call.

## 2016-11-07 ENCOUNTER — Ambulatory Visit (INDEPENDENT_AMBULATORY_CARE_PROVIDER_SITE_OTHER): Payer: Medicare Other | Admitting: Podiatry

## 2016-11-07 ENCOUNTER — Encounter: Payer: Self-pay | Admitting: Podiatry

## 2016-11-07 DIAGNOSIS — L84 Corns and callosities: Secondary | ICD-10-CM

## 2016-11-07 DIAGNOSIS — M79676 Pain in unspecified toe(s): Secondary | ICD-10-CM

## 2016-11-07 DIAGNOSIS — B351 Tinea unguium: Secondary | ICD-10-CM

## 2016-11-07 NOTE — Progress Notes (Signed)
Complaint:  Visit Type: Patient returns to my office for continued preventative foot care services. Complaint: Patient states" my nails have grown long and thick and become painful to walk and wear shoes" Patient also has painful callus on both feet. The patient presents for preventative foot care services. No changes to ROS  Podiatric Exam: Vascular: dorsalis pedis and posterior tibial pulses are palpable bilateral. Capillary return is immediate. Temperature gradient is WNL. Skin turgor WNL  Sensorium: Normal Semmes Weinstein monofilament test. Normal tactile sensation bilaterally. Nail Exam: Pt has thick disfigured discolored nails with subungual debris noted bilateral entire nail hallux through fifth toenails Ulcer Exam: There is no evidence of ulcer or pre-ulcerative changes or infection. Orthopedic Exam: Muscle tone and strength are WNL. No limitations in general ROM. No crepitus or effusions noted. Foot type and digits show no abnormalities. Bony prominences are unremarkable. Plantarflexed fifth metatarsals  B/L Skin: No Porokeratosis. No infection or ulcers  Diagnosis:  Onychomycosis, , Pain in right toe, pain in left toes, Callus  Treatment & Plan Procedures and Treatment: Consent by patient was obtained for treatment procedures. The patient understood the discussion of treatment and procedures well. All questions were answered thoroughly reviewed. Debridement of mycotic and hypertrophic toenails, 1 through 5 bilateral and clearing of subungual debris. No ulceration, no infection noted.  Return Visit-Office Procedure: Patient instructed to return to the office for a follow up visit 3 months for continued evaluation and treatment.    Helane GuntherGregory Valera Vallas DPM

## 2016-11-08 ENCOUNTER — Ambulatory Visit: Payer: Medicare Other | Admitting: Podiatry

## 2016-11-18 ENCOUNTER — Telehealth: Payer: Self-pay | Admitting: Cardiovascular Disease

## 2016-11-18 DIAGNOSIS — I350 Nonrheumatic aortic (valve) stenosis: Secondary | ICD-10-CM

## 2016-11-18 DIAGNOSIS — Z953 Presence of xenogenic heart valve: Secondary | ICD-10-CM

## 2016-11-18 NOTE — Telephone Encounter (Signed)
Pt is calling, stating last encounter with Dr. Mariah MillingGollan, he mentioned he was going to schedule her an appt to "look at the valve in my heart". Please call.

## 2016-11-18 NOTE — Telephone Encounter (Signed)
Per her last ov:  "Repeat echocardiogram next year at her convenience She has declined checking this year"  Order is placed for her to schedule at her convenience.

## 2016-12-03 ENCOUNTER — Other Ambulatory Visit: Payer: Self-pay | Admitting: Cardiovascular Disease

## 2016-12-12 ENCOUNTER — Encounter: Payer: Self-pay | Admitting: Cardiovascular Disease

## 2016-12-12 ENCOUNTER — Ambulatory Visit (INDEPENDENT_AMBULATORY_CARE_PROVIDER_SITE_OTHER): Payer: Medicare Other | Admitting: Cardiovascular Disease

## 2016-12-12 VITALS — BP 150/82 | HR 66 | Ht 69.0 in | Wt 175.2 lb

## 2016-12-12 DIAGNOSIS — E7800 Pure hypercholesterolemia, unspecified: Secondary | ICD-10-CM

## 2016-12-12 DIAGNOSIS — I739 Peripheral vascular disease, unspecified: Secondary | ICD-10-CM | POA: Diagnosis not present

## 2016-12-12 NOTE — Progress Notes (Signed)
Cardiology Office Note   Date:  12/16/2016   ID:  Bryona, Foxworthy Aug 14, 1946, MRN [id number]  PCP:  [name], MD  Cardiologist:  Dr. Mariah Milling  Chief Complaint  Patient presents with  . other    12 month follow up. patient c/o left leg cramps, numbness, and cold. Meds reviewed verbally with patient.       History of Present Illness: [name] Nakeshia Waldeck is a 71 y.o. female who presents for A follow-up visit regarding peripheral arterial disease and chronic venous insufficiency.  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  She is known to have mild peripheral arterial disease. Previous ABI was normal on the right and mildly reduced on the left at 0.7 with evidence of possible left SFA disease on duplex. She has been treated conservatively given her atypical symptoms and overall poor functional capacity. She also has chronic venous insufficiency.  She has been stable overall with no worsening symptoms and no evidence of critical limb ischemia. She has chronic leg cramps mostly at night.  Past Medical History:  Diagnosis Date  . Atrial fibrillation, permanent (HCC)    a. CHA2DS2VASc = 4-->coumadin;  b. 09/2014 Echo: EF 55-60%, normal wall motion, mild AI/AS, nl MV, mod dil LA, mildly dil RA, mild to mod TR, PASP .  [name] Chronic kidney disease, stage I   . CVA (cerebral infarction)    a. 04/2015 - anticoagulation switched from eliquis to xarelto to coumadin.  . Leukocytosis   . Pacemaker -Medtronic    a. implant for SSS  . PAD (peripheral artery disease) (HCC)    a. w/ left lower ext claudication s/p ABI's 04/2015  showing nl ABI on Right with abnl waveforms on left sugg of L SFA dzs.  [name] Restless leg syndrome   . 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    Past Surgical History:  Procedure Laterality Date  . bladder injection    . botox bladder    . INSERT / REPLACE / REMOVE PACEMAKER     implant for SSS  . MELANOMA EXCISION     face  . MITRAL VALVE REPLACEMENT     s/p mechanical valve replaced with bovine valve 2010  . NASAL SINUS SURGERY       Current Outpatient Prescriptions  Medication Sig Dispense Refill  . albuterol (PROVENTIL HFA;VENTOLIN HFA) 108 (90 BASE) MCG/ACT inhaler Inhale 2 puffs into the lungs every 6 (six) hours as needed for wheezing.    [name] ascorbic acid (VITAMIN C) 500 MG tablet Take 500 mg by mouth daily.    [name] atorvastatin (LIPITOR) 80 MG tablet Take 80 mg by mouth daily.    . calcium-vitamin D (OSCAL-500) 500-400 MG-UNIT per tablet Take 1 tablet by mouth 2 (two) times daily.    . Cholecalciferol (VITAMIN D3) 1000 UNITS CAPS Take 2,000 Units by mouth every 30 (thirty) days.     . cyclobenzaprine (FLEXERIL) 10 MG tablet Take 10 mg by mouth at bedtime.     [name] DIAZEPAM PO Take 5 mg by mouth as needed.     . diclofenac  sodium (VOLTAREN) 1 % GEL Apply 1 application topically 2 (two) times daily.      [name]. diltiazem (CARDIZEM CD) 120 MG 24 hr capsule Take 1 capsule (120 mg total) by mouth daily. 90 capsule 3  . diltiazem (CARDIZEM CD) 120 MG 24 hr capsule TAKE 1 CAPSULE (120 MG TOTAL) BY MOUTH DAILY. 90 capsule 0  . Docusate Calcium (STOOL SOFTENER PO) Take 1 tablet by mouth 2 (two) times daily.    . DULoxetine (CYMBALTA) 30 MG capsule Take 60 mg by mouth daily.     . fluticasone (FLONASE) 50 MCG/ACT nasal spray Place into both nostrils daily.    . furosemide (LASIX) 20 MG tablet Take 2 tablets (40 mg total) by mouth every other day. 90 tablet 3  . magnesium oxide (MAG-OX) 400 MG tablet Take 400 mg by mouth 2 (two)  times daily.     . meclizine (ANTIVERT) 25 MG tablet Take 25 mg by mouth as needed for dizziness.    . mirabegron ER (MYRBETRIQ) 50 MG TB24 tablet Take 50 mg by mouth daily.    . Multiple Vitamin (MULTIVITAMIN) tablet Take 1 tablet by mouth daily.    [name]. oxyCODONE-acetaminophen (PERCOCET) 5-325 MG per tablet Take 1 tablet by mouth every 4 (four) hours as needed.      . pantoprazole (PROTONIX) 40 MG tablet Take by mouth.    . potassium chloride (K-DUR) 10 MEQ tablet Take 1 tablet (10 mEq total) by mouth daily as needed. Take with lasix 90 tablet 3  . rOPINIRole (REQUIP) 2 MG tablet Take 2 mg by mouth at bedtime.     [name]. warfarin (COUMADIN) 2 MG tablet Take 2 tablets (4 mg total) by mouth daily. 120 tablet 6   No current facility-administered medications for this visit.     Allergies:   Morphine; Other; Statins; Tizanidine; Tape; Tapentadol; Cephalexin; Latex; and Sulfa antibiotics    Social History:  The patient  reports that she quit smoking about 45 years ago. Her smoking use included Cigarettes. She has a 5.00 pack-year smoking history. She has never used smokeless tobacco. She reports that she does not drink alcohol or use drugs.   Family History:  The patient's Family history is unknown by patient.      PHYSICAL EXAM: VS:  BP (!) 150/82 (BP Location: Left Arm, Patient Position: Sitting, Cuff Size: Normal)   Pulse 66   Ht 5\' 9"  (1.753 m)   Wt 175 lb 4 oz (79.5 kg)   SpO2 93%   BMI 25.88 kg/m  , BMI Body mass index is 25.88 kg/m. GEN: Well nourished, well developed, in no acute distress HEENT: normal Neck: no JVD, carotid bruits, or masses Cardiac: RRR; no murmurs, rubs, or gallops. Respiratory:  clear to auscultation bilaterally, normal work of breathing GI: soft, nontender, nondistended, + BS MS: no deformity or atrophy Skin: warm and dry, no rash Neuro:  Strength and sensation are intact Psych: euthymic mood, full affect Vascular: Posterior tibial pulses palpable  bilaterally. She has trace edema bilaterally with varicose veins and mild stasis.   EKG:  EKG is ordered today. EKG showed ventricular demand pacemaker  Recent Labs: No results found for requested labs within last 8760 hours.    Lipid Panel No results found for: CHOL, TRIG, HDL, CHOLHDL, VLDL, LDLCALC, LDLDIRECT    Wt Readings from Last 3 Encounters:  12/12/16 175 lb 4 oz (79.5 kg)  08/09/16 173 lb 12 oz (78.8 kg)  12/22/15 177 lb 4 oz (80.4  kg)        ASSESSMENT AND PLAN:  1.  Chronic venous insufficiency:  This seems to be stable with no recent worsening. Continue support stockings and leg elevation.  2. Peripheral arterial disease: Previous vascular studies showed possible left SFA disease.  No evidence of significant leg ischemia. No convincing symptoms of claudication.  3. Atrial fibrillation: Ventricular rate is controlled and she is on anticoagulation.  4. Hyperlipidemia: Continue treatment with atorvastatin with a target LDL of less than 70.     Disposition:   FU with me as needed. Continue to follow-up with Dr.Gollan on a regular basis.  Signed,  Lorine Bears, MD  12/16/2016 1:31 PM    [facility]

## 2016-12-12 NOTE — Patient Instructions (Signed)
Medication Instructions:  Your physician recommends that you continue on your current medications as directed. Please refer to the Current Medication list given to you today.   Labwork: none  Testing/Procedures: none  Follow-Up: Your physician recommends that you schedule a follow-up appointment as needed with Dr. Arida.    Any Other Special Instructions Will Be Listed Below (If Applicable).     If you need a refill on your cardiac medications before your next appointment, please call your pharmacy.   

## 2016-12-14 ENCOUNTER — Other Ambulatory Visit: Payer: Medicare Other

## 2016-12-27 ENCOUNTER — Encounter: Payer: Medicare Other | Admitting: Internal Medicine

## 2017-01-10 ENCOUNTER — Other Ambulatory Visit: Payer: Self-pay

## 2017-01-10 ENCOUNTER — Ambulatory Visit (INDEPENDENT_AMBULATORY_CARE_PROVIDER_SITE_OTHER): Payer: Medicare Other

## 2017-01-10 DIAGNOSIS — Z953 Presence of xenogenic heart valve: Secondary | ICD-10-CM | POA: Diagnosis not present

## 2017-01-10 DIAGNOSIS — I350 Nonrheumatic aortic (valve) stenosis: Secondary | ICD-10-CM | POA: Diagnosis not present

## 2017-02-05 NOTE — Progress Notes (Deleted)
Cardiology Office Note  Date:  02/05/2017   ID:  Rhonda BalesVirginia Eleanor Griffith, DOB 04/08/1946, MRN [id number]  PCP:  [name], Todd [place], MD   No chief complaint on file.   HPI:  Rhonda Griffith is a pleasant 71 year old woman with a history of  chronic atrial fibrillation,  pacemaker,  rheumatic valve disease with mitral valve replacement with bovine valve,    falls with a very large hematoma on her left leg earlier in 2012,  GI bleeding,  left arm fracture, previously on warfarin, change to eliquis,   sick sinus syndrome for which a pacemaker was placed.  bilateral knee replacements History of sleep apnea, not on CPAP She presents today for follow-up of her atrial fibrillation, stroke (Apr 28, 2015)  In follow-up today, she reports that she is not on lasix, only takes as needed for ankle swelling Stopped her lipitor (cramps) Taking potassium for cramps (ran out over one month ago, wants more) Weight dropping, does not eat lunch, down 20 pounds in the past year Napping a lot, often sleeps through lunch Frequently tearful through her visit today, misses her husband who passed away 1 year ago  Denies any leg swelling No longer wears her compression hose Continued gait instability Denies any symptoms concerning for claudication  EKG showing V paced rhythm, rate 79 bpm  Other past medical history reviewed History of heavy vaginal bleeding, none recently Chronic left lower extremity toe drop, foot drop.  Previously had shortness of breath, improved with Lasix.  spends significant time playing on the computer  She does take a pain pill occasionally to sleep better.  Valium for anxiety. severe bilateral knee pain.  Statin was held in the past for leg pain, Zocor.  [facility] in her ventricle. She reports no significant improvement after this procedure (drain?).  Placement of a bladder stimulator implant for overactive bladder was denied  despite cardiac clearance . 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  PSH:    Past Surgical History:  Procedure Laterality Date  . bladder injection    . botox bladder    . INSERT / REPLACE / REMOVE PACEMAKER     implant for SSS  . MELANOMA EXCISION     face  . MITRAL VALVE REPLACEMENT     s/p mechanical valve replaced with bovine valve 2010  . NASAL SINUS SURGERY      Current Outpatient Prescriptions  Medication Sig Dispense Refill  . albuterol (PROVENTIL HFA;VENTOLIN HFA) 108 (90 BASE) MCG/ACT inhaler Inhale 2 puffs into the lungs every 6 (six) hours as needed for wheezing.    [name]. ascorbic acid (VITAMIN C) 500 MG tablet Take 500 mg by mouth daily.    [name]. atorvastatin (LIPITOR) 80 MG tablet Take 80 mg by mouth daily.    . calcium-vitamin D (OSCAL-500) 500-400 MG-UNIT per tablet Take 1 tablet by mouth 2 (two) times daily.    . Cholecalciferol (VITAMIN D3) 1000 UNITS CAPS Take 2,000 Units by mouth every 30 (thirty) days.     . cyclobenzaprine (FLEXERIL) 10 MG tablet Take 10 mg by mouth at bedtime.     [name]. DIAZEPAM PO Take 5 mg by mouth as needed.     . diclofenac sodium (VOLTAREN)  1 % GEL Apply 1 application topically 2 (two) times daily.      [name] diltiazem (CARDIZEM CD) 120 MG 24 hr capsule Take 1 capsule (120 mg total) by mouth daily. 90 capsule 3  . diltiazem (CARDIZEM CD) 120 MG 24 hr capsule TAKE 1 CAPSULE (120 MG TOTAL) BY MOUTH DAILY. 90 capsule 0  . Docusate Calcium (STOOL SOFTENER PO) Take 1 tablet by mouth 2 (two) times daily.    . DULoxetine (CYMBALTA) 30 MG capsule Take 60 mg by mouth daily.     . fluticasone (FLONASE) 50 MCG/ACT nasal spray Place into both nostrils daily.    . furosemide (LASIX)  20 MG tablet Take 2 tablets (40 mg total) by mouth every other day. 90 tablet 3  . magnesium oxide (MAG-OX) 400 MG tablet Take 400 mg by mouth 2 (two) times daily.     . meclizine (ANTIVERT) 25 MG tablet Take 25 mg by mouth as needed for dizziness.    . mirabegron ER (MYRBETRIQ) 50 MG TB24 tablet Take 50 mg by mouth daily.    . Multiple Vitamin (MULTIVITAMIN) tablet Take 1 tablet by mouth daily.    [name] oxyCODONE-acetaminophen (PERCOCET) 5-325 MG per tablet Take 1 tablet by mouth every 4 (four) hours as needed.      . pantoprazole (PROTONIX) 40 MG tablet Take by mouth.    . potassium chloride (K-DUR) 10 MEQ tablet Take 1 tablet (10 mEq total) by mouth daily as needed. Take with lasix 90 tablet 3  . rOPINIRole (REQUIP) 2 MG tablet Take 2 mg by mouth at bedtime.     [name] warfarin (COUMADIN) 2 MG tablet Take 2 tablets (4 mg total) by mouth daily. 120 tablet 6   No current facility-administered medications for this visit.      Allergies:   Morphine; Other; Statins; Tizanidine; Tape; Tapentadol; Cephalexin; Latex; and Sulfa antibiotics   Social History:  The patient  reports that she quit smoking about 46 years ago. Her smoking use included Cigarettes. She has a 5.00 pack-year smoking history. She has never used smokeless tobacco. She reports that she does not drink alcohol or use drugs.   Family History:   Family history is unknown by patient.    Review of Systems: Review of Systems  Constitutional: Positive for weight loss.  Respiratory: Negative.   Cardiovascular: Negative.   Gastrointestinal: Negative.   Musculoskeletal: Positive for myalgias.       Gait instability  Neurological: Negative.   Psychiatric/Behavioral: Negative.   All other systems reviewed and are negative.    PHYSICAL EXAM: VS:  There were no vitals taken for this visit. , BMI There is no height or weight on file to calculate BMI. GEN: Well nourished, well developed, in no acute distress  HEENT: normal  Neck: no JVD,  carotid bruits, or masses Cardiac: RRR; no murmurs, rubs, or gallops,no edema  Respiratory:  clear to auscultation bilaterally, normal work of breathing GI: soft, nontender, nondistended, + BS MS: no deformity or atrophy  Skin: warm and dry, no rash Neuro:  Strength and sensation are intact Psych: euthymic mood, full affect    Recent Labs: No results found for requested labs within last 8760 hours.    Lipid Panel No results found for: CHOL, HDL, LDLCALC, TRIG    Wt Readings from Last 3 Encounters:  12/12/16 175 lb 4 oz (79.5 kg)  08/09/16 173 lb 12 oz (78.8 kg)  12/22/15 177 lb 4 oz (80.4 kg)  ASSESSMENT AND PLAN:  Atrial fibrillation, unspecified type (HCC) - Plan: EKG 12-Lead On anticoagulation, the paced rhythm No recent falls per the patient  Chronic diastolic heart failure (HCC) - Plan: EKG 12-Lead She has stopped her Lasix on her own, No leg edema on today's visit, appears euvolemic Recommended she take Lasix for any ankle swelling, shortness of breath  Essential hypertension - Plan: EKG 12-Lead Blood pressure elevated today. 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   Mitral valve replaced Repeat echocardiogram next year at her convenience She has declined checking this year   Total encounter time more than 25 minutes  Greater than 50% was spent in counseling and coordination of care with the patient   Disposition:   F/U  6 months   No orders of the defined types were placed in this encounter.    Signed, Dossie Arbour, M.D., Ph.D. 02/05/2017   [facility] [place], [place] [phone]

## 2017-02-07 ENCOUNTER — Ambulatory Visit: Payer: Medicare Other | Admitting: Cardiovascular Disease

## 2017-02-13 ENCOUNTER — Ambulatory Visit: Payer: Medicare Other | Admitting: Podiatry

## 2017-02-21 ENCOUNTER — Encounter: Payer: Medicare Other | Admitting: Internal Medicine

## 2017-03-02 ENCOUNTER — Ambulatory Visit
Admission: EM | Admit: 2017-03-02 | Discharge: 2017-03-02 | Disposition: A | Discharge status: Home / Self Care | Payer: Medicare Other | Attending: Emergency Medicine | Admitting: Emergency Medicine

## 2017-03-02 DIAGNOSIS — I776 Arteritis, unspecified: Secondary | ICD-10-CM | POA: Diagnosis not present

## 2017-03-02 DIAGNOSIS — R21 Rash and other nonspecific skin eruption: Secondary | ICD-10-CM

## 2017-03-02 MED ORDER — DOXYCYCLINE HYCLATE 100 MG PO CAPS: 100.0000 mg | ORAL_CAPSULE | Freq: Two times a day (BID) | ORAL | 0 refills | Status: AC

## 2017-03-02 MED ORDER — PREDNISONE 10 MG PO TABS: ORAL_TABLET | ORAL | 0 refills | Status: DC

## 2017-03-02 NOTE — ED Provider Notes (Signed)
HPI  SUBJECTIVE:  Rhonda Griffith is a 71 y.o. female who presents with a painful, itchy, burning rash in her bilateral lower extremities starting last night. She denies preceding paresthesias. She tried hydrocortisone with some improvement in her symptoms. She also tried cool compresses and a Percocet that was prescribed to her for her knee pain. There are no aggravating factors. She denies blisters, crusting, body aches, flu symptoms, exposure to poison ivy, change in her medicines. No new lotions, soaps, detergents, blood on the bedclothes, sensation being bitten at night. No contacts with similar rash. No known tick bite. She does have 2 dogs that go outside. She denies rash on the palms of her hands or soles of her feet. She has an extensive past medical history including A. fib, status post mitral valve replacement 2, pacemaker 2, warfarin use, CHF, shingles. No history of diabetes, hypertension. [id number], Desiree Lucy, MD    Past Medical History:  Diagnosis Date  . Atrial fibrillation, permanent (HCC)    a. CHA2DS2VASc = 4-->coumadin;  b. 09/2014 Echo: EF 55-60%, normal wall motion, mild AI/AS, nl MV, mod dil LA, mildly dil RA, mild to mod TR, PASP .  [name] Chronic kidney disease, stage I   . CVA (cerebral infarction)    a. 04/2015 - anticoagulation switched from eliquis to xarelto to coumadin.  . Leukocytosis   . Pacemaker -Medtronic    a. implant for SSS  . PAD (peripheral artery disease) (HCC)    a. w/ left lower ext claudication s/p ABI's 04/2015 showing nl ABI on Right with abnl waveforms on left sugg of L SFA dzs.  [name] Restless leg syndrome   . 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    Past Surgical History:  Procedure Laterality Date  . bladder injection    . botox bladder    . INSERT / REPLACE / REMOVE PACEMAKER     implant for SSS  . MELANOMA EXCISION     face  . MITRAL VALVE  REPLACEMENT     s/p mechanical valve replaced with bovine valve 2010  . NASAL SINUS SURGERY      Family History  Problem Relation Age of Onset  . Family history unknown: Yes    Social History  Substance Use Topics  . Smoking status: Former Smoker    Packs/day: 1.00    Years: 5.00    Types: Cigarettes    Quit date: 12/29/1970  . Smokeless tobacco: Never Used  . Alcohol use No    No current facility-administered medications for this encounter.   Current Outpatient Prescriptions:  .  albuterol (PROVENTIL HFA;VENTOLIN HFA) 108 (90 BASE) MCG/ACT inhaler, Inhale 2 puffs into the lungs every 6 (six) hours as needed for wheezing., Disp: , Rfl:  .  ascorbic acid (VITAMIN C) 500 MG tablet, Take 500 mg by mouth daily., Disp: , Rfl:  .  atorvastatin (LIPITOR) 80 MG tablet, Take 80 mg by mouth daily., Disp: , Rfl:  .  calcium-vitamin D (OSCAL-500) 500-400 MG-UNIT per tablet, Take 1 tablet by mouth 2 (two) times daily., Disp: , Rfl:  .  Cholecalciferol (VITAMIN D3) 1000 UNITS CAPS, Take 2,000 Units by mouth every 30 (thirty) days. , Disp: , Rfl:  .  cyclobenzaprine (FLEXERIL) 10 MG tablet, Take 10 mg by mouth at bedtime. , Disp: , Rfl:  .  DIAZEPAM PO, Take 5 mg by mouth as needed. , Disp: , Rfl:  .  diclofenac sodium (VOLTAREN) 1 % GEL, Apply 1 application topically 2 (two) times daily.  , Disp: , Rfl:  .  diltiazem (CARDIZEM CD) 120 MG 24 hr capsule, Take 1 capsule (120 mg total) by mouth daily., Disp: 90 capsule, Rfl: 3 .  diltiazem (CARDIZEM CD) 120 MG 24 hr capsule, TAKE 1 CAPSULE (120 MG TOTAL) BY MOUTH DAILY., Disp: 90 capsule, Rfl: 0 .  Docusate Calcium (STOOL SOFTENER PO), Take 1 tablet by mouth 2 (two) times daily., Disp: , Rfl:  .  DULoxetine (CYMBALTA) 30 MG capsule, Take 60 mg by mouth daily. , Disp: , Rfl:  .  fluticasone (FLONASE) 50 MCG/ACT nasal spray, Place into both nostrils daily., Disp: , Rfl:  .  furosemide (LASIX) 20 MG tablet, Take 2 tablets (40 mg total) by mouth every  other day., Disp: 90 tablet, Rfl: 3 .  magnesium oxide (MAG-OX) 400 MG tablet, Take 400 mg by mouth 2 (two) times daily. , Disp: , Rfl:  .  mirabegron ER (MYRBETRIQ) 50 MG TB24 tablet, Take 50 mg by mouth daily., Disp: , Rfl:  .  Multiple Vitamin (MULTIVITAMIN) tablet, Take 1 tablet by mouth daily., Disp: , Rfl:  .  oxyCODONE-acetaminophen (PERCOCET) 5-325 MG per tablet, Take 1 tablet by mouth every 4 (four) hours as needed.  , Disp: , Rfl:  .  pantoprazole (PROTONIX) 40 MG tablet, Take by mouth., Disp: , Rfl:  .  potassium chloride (K-DUR) 10 MEQ tablet, Take 1 tablet (10 mEq total) by mouth daily as needed. Take with lasix, Disp: 90 tablet, Rfl: 3 .  rOPINIRole (REQUIP) 2 MG tablet, Take 2 mg by mouth at bedtime. , Disp: , Rfl:  .  warfarin (COUMADIN) 2 MG tablet, Take 2 tablets (4 mg total) by mouth daily., Disp: 120 tablet, Rfl: 6 .  doxycycline (VIBRAMYCIN) 100 MG capsule, Take 1 capsule (100 mg total) by mouth 2 (two) times daily., Disp: 20 capsule, Rfl: 0 .  predniSONE (DELTASONE) 10 MG tablet, 4 tabs on days 1-3, 2 tabs on days 4-5, 1 tab on days 6-7, Disp: 18 tablet, Rfl: 0  Allergies  Allergen Reactions  . Morphine Nausea And Vomiting  . Other Other (See Comments)    Several different statins.  . Statins Other (See Comments)    Several different statins.  . Tizanidine Palpitations  . Tape Other (See Comments)    Breaks her skin Allergic to plastic/latex tape. Only use paper tape on patient.  . Tapentadol Other (See Comments)    Allergic to plastic/latex tape. Only use paper tape on patient.  . Cephalexin Rash  . Latex Rash  . Sulfa Antibiotics Rash and Other (See Comments)    Allergic to plastic/latex tape. Only use paper tape on patient. Allergic to plastic/latex tape. Only use paper tape on patient.     ROS  As noted in HPI.   Physical Exam  BP (!) 152/71 (BP Location: Right Arm)   Pulse 65   Temp 97.7 F (36.5 C) (Oral)   Resp 18   Ht 5\' 9"  (1.753 m)   Wt 180  lb (81.6 kg)   SpO2 100%   BMI 26.58 kg/m   Constitutional: Well developed, well nourished, no acute distress Eyes:  EOMI, conjunctiva normal bilaterally HENT: Normocephalic, atraumatic,mucus membranes moist Respiratory: Normal inspiratory effort Cardiovascular: Normal rate GI: nondistended skin: Tenderness in blanchable flat areas of erythema with no induration, edema on bilateral lower extremities, several along the medial right lower extremity and one on the  lateral left lower extremity. Also tender pustules posterior right leg and on the right ankle. No rash on the palms of hands or soles of feet. No crusting. No rash on her arms or torso          Musculoskeletal: no deformities Neurologic: Alert & oriented x 3, no focal neuro deficits Psychiatric: Speech and behavior appropriate   ED Course   Medications - No data to display  Orders Placed This Encounter  Procedures  . Rocky mtn spotted fvr abs pnl(IgG+IgM)    Standing Status:   Standing    Number of Occurrences:   1  . B. burgdorfi antibodies    Standing Status:   Standing    Number of Occurrences:   1    No results found for this or any previous visit (from the past 24 hour(s)). No results found.  ED Clinical Impression  Vasculitis (HCC)  Rash   ED Assessment/Plan  Presentation suggestive of a vasculitis, although tickborne disease or infected flea bites is in the differential. doubt MRSA, shingles.   Sending off lyme, RMSF antibodies. We'll send home with doxycycline 100 mg by mouth twice a day for 10 days. Patient states that she has an INR recheck in 5 days. Advised her that this may increase her INR. Also home with a week of low-dose prednisone taper, we'll refer to Dr. Ebony CargoBenitez Graham, dermatology.  Discussed  MDM, plan and followup with patient. Discussed sn/sx that should prompt return to the ED. Patient agrees with plan.   Meds ordered this encounter  Medications  . doxycycline (VIBRAMYCIN)  100 MG capsule    Sig: Take 1 capsule (100 mg total) by mouth 2 (two) times daily.    Dispense:  20 capsule    Refill:  0  . predniSONE (DELTASONE) 10 MG tablet    Sig: 4 tabs on days 1-3, 2 tabs on days 4-5, 1 tab on days 6-7    Dispense:  18 tablet    Refill:  0    *This clinic note was created using Scientist, clinical (histocompatibility and immunogenetics)Dragon dictation software. Therefore, there may be occasional mistakes despite careful proofreading.  ?   Domenick GongMortenson, Rayleen Wyrick, MD 03/02/17 [phone]

## 2017-03-02 NOTE — Discharge Instructions (Signed)
I think that this is a vasculitis, however, I'm going to cover you as if this is an infection. Follow-up with Dr. Ebony CargoBenitez Graham if this does not get better within a week. Go immediately to the ER for the signs and symptoms we discussed

## 2017-03-02 NOTE — ED Triage Notes (Signed)
Pt noticed a rash on her legs and arms last night. She woke up and they are progressively worse. Painful and itch bad.

## 2017-03-03 ENCOUNTER — Ambulatory Visit: Payer: Medicare Other | Admitting: Cardiovascular Disease

## 2017-03-03 ENCOUNTER — Telehealth: Payer: Self-pay | Admitting: Cardiovascular Disease

## 2017-03-03 NOTE — Telephone Encounter (Signed)
Pt daughter calling stating [facility]  They told she has vesiculitis  Just wanting to let us know For we have never mentioned anything to them about this Please advise

## 2017-03-06 LAB — B. BURGDORFI ANTIBODIES: B burgdorferi Ab IgG+IgM: <0.91 {ISR} (ref 0.00–0.90)

## 2017-03-06 NOTE — Telephone Encounter (Signed)
She needs to see Dermatology or Rheumatology for that.

## 2017-03-06 NOTE — Telephone Encounter (Signed)
Spoke w/ pt.  Advised her of Dr. Jari SportsmanArida's recommendation.  She has an appt w/ dermatology this week.  She is appreciative of the call.

## 2017-03-07 LAB — ROCKY MTN SPOTTED FVR ABS PNL(IGG+IGM)
RMSF IgG: NEGATIVE
RMSF IgM: 0.36 index (ref 0.00–0.89)

## 2017-03-09 ENCOUNTER — Ambulatory Visit: Payer: Medicare Other | Admitting: Podiatry

## 2017-03-16 ENCOUNTER — Telehealth: Payer: Self-pay | Admitting: Internal Medicine

## 2017-03-16 NOTE — Telephone Encounter (Signed)
Pt is scheduled to see DR. Graciela HusbandsKlein on 7/3, she asks if she should do a remote check before this appt, and if so, what day. Please advise.

## 2017-03-16 NOTE — Telephone Encounter (Signed)
Notified [name] that we will check her PPM at her appointment with Dr. Graciela HusbandsKlein 03/28/17.

## 2017-03-28 ENCOUNTER — Encounter: Payer: Self-pay | Admitting: Internal Medicine

## 2017-03-28 ENCOUNTER — Ambulatory Visit: Payer: Medicare Other | Admitting: Cardiovascular Disease

## 2017-03-28 ENCOUNTER — Ambulatory Visit (INDEPENDENT_AMBULATORY_CARE_PROVIDER_SITE_OTHER): Payer: Medicare Other | Admitting: Internal Medicine

## 2017-03-28 VITALS — BP 136/62 | HR 81 | Ht 69.0 in | Wt 168.0 lb

## 2017-03-28 DIAGNOSIS — Z950 Presence of cardiac pacemaker: Secondary | ICD-10-CM | POA: Diagnosis not present

## 2017-03-28 DIAGNOSIS — I482 Chronic atrial fibrillation: Secondary | ICD-10-CM | POA: Diagnosis not present

## 2017-03-28 DIAGNOSIS — R001 Bradycardia, unspecified: Secondary | ICD-10-CM | POA: Diagnosis not present

## 2017-03-28 DIAGNOSIS — I4821 Permanent atrial fibrillation: Secondary | ICD-10-CM

## 2017-03-28 DIAGNOSIS — I495 Sick sinus syndrome: Secondary | ICD-10-CM | POA: Diagnosis not present

## 2017-03-28 LAB — CUP PACEART INCLINIC DEVICE CHECK
Battery Impedance: 4934 Ohm
Battery Voltage: 2.69 V
Date Time Interrogation Session: 20180703151358
Implantable Lead Implant Date: 20090317
Lead Channel Pacing Threshold Amplitude: 0.5 V
Lead Channel Pacing Threshold Amplitude: 0.5 V
Lead Channel Sensing Intrinsic Amplitude: 11.2 mV
Lead Channel Setting Pacing Amplitude: 2.5 V
MDC IDC LEAD LOCATION: 753860
MDC IDC MSMT BATTERY REMAINING LONGEVITY: 10 mo
MDC IDC MSMT LEADCHNL RA IMPEDANCE VALUE: 0 Ohm
MDC IDC MSMT LEADCHNL RV IMPEDANCE VALUE: 464 Ohm
MDC IDC MSMT LEADCHNL RV PACING THRESHOLD PULSEWIDTH: 0.4 ms
MDC IDC MSMT LEADCHNL RV PACING THRESHOLD PULSEWIDTH: 0.4 ms
MDC IDC PG IMPLANT DT: 20090317
MDC IDC SET LEADCHNL RV PACING PULSEWIDTH: 0.4 ms
MDC IDC SET LEADCHNL RV SENSING SENSITIVITY: 4 mV
MDC IDC STAT BRADY RV PERCENT PACED: 49 %

## 2017-03-28 NOTE — Progress Notes (Signed)
skf      Patient Care Team: Shapely-Quinn, Desiree Lucy, MD as PCP - General (Family Medicine) Antonieta Iba, MD as Consulting Physician (Cardiology) Duke Salvia, MD as Consulting Physician (Cardiology) Iran Ouch, MD as Consulting Physician (Cardiology)   HPI  Rhonda Griffith is a 71 y.o. female Former patient of Dr. Leonia Reeves who underwent pacemaker implantation for symptomatic bradycardia in the context of atrial fibrillation.  She also has a history of mitral   valve disease status post bioprosthetic mitral valve replacement. Last echocardiogram 1/16 2 demonstrated normal LV function normal valvular function mild-moderate left atrial dilatation  DATE TEST    1/16    Echo   EF 69 % Moderate LAE   4/18    Echo   EF 60 % LVH/ Severe LAE 52/2.6/73-- nl MV function          Intercurrent hx notable for stroke and was changed from apixoban to Rivaroxaban at [facility] notes reviewed they wanted to try something diffferent;  she subsequently came back to see Dr. Knute Neu who elected to put her on Coumadin with a target INR of 2.0--3.0.  She has had no interval bleeding.  She has some shortness of breath with walking. She has some peripheral edema that also tracks with her dyspnea.    Date Cr K Mg Hgb  12/16 1.27 3.4  11.9  6/18 1.0 3.3 1.6           Past Medical History:  Diagnosis Date  . Atrial fibrillation, permanent (HCC)    a. CHA2DS2VASc = 4-->coumadin;  b. 09/2014 Echo: EF 55-60%, normal wall motion, mild AI/AS, nl MV, mod dil LA, mildly dil RA, mild to mod TR, PASP .  [name] Chronic kidney disease, stage I   . CVA (cerebral infarction)    a. 04/2015 - anticoagulation switched from eliquis to xarelto to coumadin.  . Leukocytosis   . Pacemaker -Medtronic    a. implant for SSS  . PAD (peripheral artery disease) (HCC)    a. w/ left lower ext claudication s/p ABI's 04/2015 showing nl ABI on Right with abnl waveforms on left sugg of L SFA dzs.  [name] Restless leg  syndrome   . 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    Past Surgical History:  Procedure Laterality Date  . bladder injection    . botox bladder    . INSERT / REPLACE / REMOVE PACEMAKER     implant for SSS  . MELANOMA EXCISION     face  . MITRAL VALVE REPLACEMENT     s/p mechanical valve replaced with bovine valve 2010  . NASAL SINUS SURGERY      Current Outpatient Prescriptions  Medication Sig Dispense Refill  . albuterol (PROVENTIL HFA;VENTOLIN HFA) 108 (90 BASE) MCG/ACT inhaler Inhale 2 puffs into the lungs every 6 (six) hours as needed for wheezing.    [name] ascorbic acid (VITAMIN C) 500 MG tablet Take 500 mg by mouth daily.    [name] atorvastatin (LIPITOR) 80 MG tablet Take 80 mg by mouth daily.    . calcium-vitamin D (OSCAL-500) 500-400 MG-UNIT per tablet Take 1 tablet by mouth 2 (two) times daily.    . Cholecalciferol (VITAMIN D3) 1000 UNITS CAPS Take 2,000 Units by mouth every 30 (thirty) days.     . cyclobenzaprine (FLEXERIL) 10 MG tablet Take 10 mg by mouth at bedtime.     [name]  DIAZEPAM PO Take 5 mg by mouth as needed.     . diclofenac sodium (VOLTAREN) 1 % GEL Apply 1 application topically 2 (two) times daily.      [name] diltiazem (CARDIZEM CD) 120 MG 24 hr capsule Take 1 capsule (120 mg total) by mouth daily. 90 capsule 3  . diltiazem (CARDIZEM CD) 120 MG 24 hr capsule TAKE 1 CAPSULE (120 MG TOTAL) BY MOUTH DAILY. 90 capsule 0  . Docusate Calcium (STOOL SOFTENER PO) Take 1 tablet by mouth 2 (two) times daily.    . DULoxetine (CYMBALTA) 30 MG capsule Take 60 mg by mouth daily.     . fluticasone (FLONASE) 50 MCG/ACT nasal spray Place into both nostrils daily.    . furosemide (LASIX) 20 MG tablet Take 2 tablets (40 mg total) by mouth every other day. 90 tablet 3  . magnesium oxide (MAG-OX) 400 MG tablet Take 400 mg by mouth 2 (two) times daily.     . mirabegron ER (MYRBETRIQ) 50 MG TB24 tablet Take 50 mg by mouth daily.     . Multiple Vitamin (MULTIVITAMIN) tablet Take 1 tablet by mouth daily.    [name] oxyCODONE-acetaminophen (PERCOCET) 5-325 MG per tablet Take 1 tablet by mouth every 4 (four) hours as needed.      . pantoprazole (PROTONIX) 40 MG tablet Take by mouth.    . potassium chloride (K-DUR) 10 MEQ tablet Take 1 tablet (10 mEq total) by mouth daily as needed. Take with lasix 90 tablet 3  . predniSONE (DELTASONE) 10 MG tablet 4 tabs on days 1-3, 2 tabs on days 4-5, 1 tab on days 6-7 18 tablet 0  . rOPINIRole (REQUIP) 2 MG tablet Take 2 mg by mouth at bedtime.     [name] warfarin (COUMADIN) 2 MG tablet Take 2 tablets (4 mg total) by mouth daily. 120 tablet 6   No current facility-administered medications for this visit.     Allergies  Allergen Reactions  . Morphine Nausea And Vomiting  . Other Other (See Comments)    Several different statins.  . Statins Other (See Comments)    Several different statins.  . Tizanidine Palpitations  . Tape Other (See Comments)    Breaks her skin Allergic to plastic/latex tape. Only use paper tape on patient.  . Tapentadol Other (See Comments)    Allergic to plastic/latex tape. Only use paper tape on patient.  . Cephalexin Rash  . Latex Rash  . Sulfa Antibiotics Rash and Other (See Comments)    Allergic to plastic/latex tape. Only use paper tape on patient. Allergic to plastic/latex tape. Only use paper tape on patient.    Review of Systems negative except from HPI and PMH  Physical Exam BP 136/62 (BP Location: Left Arm, Patient Position: Sitting, Cuff Size: Normal)   Ht 5\' 9"  (1.753 m)   Wt 168 lb (76.2 kg)   BMI 24.81 kg/m  Well developed and nourished in no acute distress HENT normal Neck supple with JVP-flat Carotids brisk and full without bruits Clear Irregularly irregular rate and rhythm with controlled ventricular response, no murmurs or gallops Abd-soft with active BS without hepatomegaly No Clubbing cyanosis edema Skin-warm and dry A & Oriented   Grossly normal sensory and motor function   ECG demonstrated atrial fibrillation 83 Intervals-/10/36 Nonspecific ST-T changes Assessment and  Plan  She has intermittent ventricular pacing this likely represents some degree of T wave memory. Last ECG 3/18 was in ventricular paced rhythm  Assessment and plan  Atrial  fibrillation -permanent  Bradycardia  Pacemaker-Medtronic  History of mitral valve replacement, mechanical followed by bioprosthetic most recently 2010  Stroke  Hypertension    Anemia   Hypokalemia/hypomagnesiumemia   Dyspnea on exertion   She is mildly volume overloaded. She takes her diuretics on an as-needed basis   She is working on controlling salt intake   Heart rate excursion seems better.  Blood pressure is well-controlled  Blood work last month demonstrated hypokalemia and hypomagnesemia. We will recheck it today. She had mild anemia 2016. We will recheck her hemoglobin.

## 2017-03-28 NOTE — Patient Instructions (Signed)
Medication Instructions: - Your physician recommends that you continue on your current medications as directed. Please refer to the Current Medication list given to you today.  Labwork: - Your physician recommends that you have lab work today: BMP/ CBC/ Magnesium  Procedures/Testing: - none ordered  Follow-Up: - Remote monitoring is used to monitor your Pacemaker of ICD from home. This monitoring reduces the number of office visits required to check your device to one time per year. It allows us to keep an eye on the functioning of your device to ensure it is working properly. You are scheduled for a device check from home on 05/02/17. You may send your transmission at any time that day. If you have a wireless device, the transmission will be sent automatically. After your physician reviews your transmission, you will receive a postcard with your next transmission date.  - Your physician wants you to follow-up in: 1 year with Dr. Graciela HusbandsKlein. You will receive a reminder letter in the mail two months in advance. If you don't receive a letter, please call our office to schedule the follow-up appointment.   Any Additional Special Instructions Will Be Listed Below (If Applicable).     If you need a refill on your cardiac medications before your next appointment, please call your pharmacy.

## 2017-03-29 LAB — CBC WITH DIFFERENTIAL/PLATELET
BASOS: 0 %
Basophils Absolute: 0 10*3/uL (ref 0.0–0.2)
EOS (ABSOLUTE): 0.2 10*3/uL (ref 0.0–0.4)
EOS: 2 %
HEMATOCRIT: 42.1 % (ref 34.0–46.6)
HEMOGLOBIN: 13.9 g/dL (ref 11.1–15.9)
IMMATURE GRANS (ABS): 0 10*3/uL (ref 0.0–0.1)
IMMATURE GRANULOCYTES: 0 %
LYMPHS: 20 %
Lymphocytes Absolute: 1.3 10*3/uL (ref 0.7–3.1)
MCH: 29.9 pg (ref 26.6–33.0)
MCHC: 33 g/dL (ref 31.5–35.7)
MCV: 91 fL (ref 79–97)
MONOCYTES: 8 %
MONOS ABS: 0.5 10*3/uL (ref 0.1–0.9)
NEUTROS PCT: 70 %
Neutrophils Absolute: 4.6 10*3/uL (ref 1.4–7.0)
Platelets: 277 10*3/uL (ref 150–379)
RBC: 4.65 x10E6/uL (ref 3.77–5.28)
RDW: 14.7 % (ref 12.3–15.4)
WBC: 6.7 10*3/uL (ref 3.4–10.8)

## 2017-03-29 LAB — BASIC METABOLIC PANEL
BUN/Creatinine Ratio: 12 (ref 12–28)
BUN: 16 mg/dL (ref 8–27)
CO2: 24 mmol/L (ref 20–29)
CREATININE: 1.33 mg/dL — AB (ref 0.57–1.00)
Calcium: 10.1 mg/dL (ref 8.7–10.3)
Chloride: 103 mmol/L (ref 96–106)
GFR calc Af Amer: 46 mL/min/{1.73_m2} — ABNORMAL LOW (ref >59)
GFR, EST NON AFRICAN AMERICAN: 40 mL/min/{1.73_m2} — AB (ref >59)
Glucose: 115 mg/dL — ABNORMAL HIGH (ref 65–99)
Potassium: 3.6 mmol/L (ref 3.5–5.2)
SODIUM: 144 mmol/L (ref 134–144)

## 2017-03-29 LAB — MAGNESIUM: Magnesium: 2.2 mg/dL (ref 1.6–2.3)

## 2017-03-31 NOTE — Addendum Note (Signed)
Addended by: Kendrick FriesLOPEZ, MARINA C on: 03/31/2017 04:36 PM   Modules accepted: Orders

## 2017-04-06 ENCOUNTER — Encounter: Payer: Self-pay | Admitting: Podiatry

## 2017-04-06 ENCOUNTER — Ambulatory Visit (INDEPENDENT_AMBULATORY_CARE_PROVIDER_SITE_OTHER): Payer: Medicare Other | Admitting: Podiatry

## 2017-04-06 DIAGNOSIS — B351 Tinea unguium: Secondary | ICD-10-CM | POA: Diagnosis not present

## 2017-04-06 DIAGNOSIS — L089 Local infection of the skin and subcutaneous tissue, unspecified: Secondary | ICD-10-CM

## 2017-04-06 DIAGNOSIS — M79676 Pain in unspecified toe(s): Secondary | ICD-10-CM

## 2017-04-06 DIAGNOSIS — Q828 Other specified congenital malformations of skin: Secondary | ICD-10-CM

## 2017-04-06 DIAGNOSIS — S90821A Blister (nonthermal), right foot, initial encounter: Secondary | ICD-10-CM

## 2017-04-06 NOTE — Progress Notes (Signed)
Complaint:  Visit Type: Patient returns to my office for continued preventative foot care services. Complaint: Patient states" my nails have grown long and thick and become painful to walk and wear shoes" Patient also has painful callus on both feet. The patient presents for preventative foot care services. No changes to ROS.  She also says she has developed blisters on the top of her right foot due to her footgear.  Finally, she has a painful corn on the fourth toe of the right foot.  Podiatric Exam: Vascular: dorsalis pedis and posterior tibial pulses are palpable bilateral. Capillary return is immediate. Temperature gradient is WNL. Skin turgor WNL  Sensorium: Normal Semmes Weinstein monofilament test. Normal tactile sensation bilaterally. Nail Exam: Pt has thick disfigured discolored nails with subungual debris noted bilateral entire nail hallux through fifth toenails Ulcer Exam: There is no evidence of ulcer or pre-ulcerative changes or infection. Orthopedic Exam: Muscle tone and strength are WNL. No limitations in general ROM. No crepitus or effusions noted. Foot type and digits show no abnormalities. Bony prominences are unremarkable. Plantarflexed fifth metatarsals  B/L Hammer toes 2-4  B/l. Skin:  Porokeratosis sub 5th met  B/l. No infection or ulcers. Heloma durum fourth toe right foot.  Diagnosis:  Onychomycosis, , Pain in right toe, pain in left toes, Callus  Treatment & Plan Procedures and Treatment: Consent by patient was obtained for treatment procedures. The patient understood the discussion of treatment and procedures well. All questions were answered thoroughly reviewed. Debridement of mycotic and hypertrophic toenails, 1 through 5 bilateral and clearing of subungual debris. Debridement of porokeratosis  B/L.  Debride HD right. Patient was told that the blisters appear to be healing on the top of her right foot.  No evidence of infection or drainage is noted.  There is a blister at  the base of the fourth toe and at the base of the second and third toes, right foot. Return Visit-Office Procedure: Patient instructed to return to the office for a follow up visit 3 months for continued evaluation and treatment.    Gardiner Barefoot DPM

## 2017-04-07 ENCOUNTER — Ambulatory Visit: Payer: Medicare Other | Admitting: Podiatry

## 2017-04-10 ENCOUNTER — Ambulatory Visit: Payer: Medicare Other | Admitting: Podiatry

## 2017-04-13 ENCOUNTER — Ambulatory Visit: Payer: Medicare Other | Admitting: Podiatry

## 2017-04-14 ENCOUNTER — Ambulatory Visit: Payer: Medicare Other | Admitting: Cardiovascular Disease

## 2017-04-20 NOTE — Progress Notes (Deleted)
Cardiology Office Note  Date:  04/20/2017   ID:  [name], [name] 1946-04-29, MRN [id number]  PCP:  [name], MD   No chief complaint on file.   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,    falls with a very large hematoma on her left leg earlier in 2012,  GI bleeding,  left arm fracture,  previously on warfarin, change to eliquis,   changed to xarelto after his stroke Now back on warfarin sick sinus syndrome,  pacemaker   bilateral knee replacements History of sleep apnea, not on CPAP She presents today for follow-up of her atrial fibrillation, stroke (Apr 28, 2015)   In follow-up today, she reports that she is not on lasix, only takes as needed for ankle swelling Stopped her lipitor (cramps) Taking potassium for cramps (ran out over one month ago, wants more) Weight dropping, does not eat lunch, down 20 pounds in the past year Napping a lot, often sleeps through lunch Frequently tearful through her visit today, misses her husband who passed away 1 year ago  Denies any leg swelling No longer wears her compression hose Continued gait instability Denies any symptoms concerning for claudication  EKG showing V paced rhythm, rate 79 bpm  Other past medical history reviewed History of heavy vaginal bleeding, none recently Chronic left lower extremity toe drop, foot drop.  Previously had shortness of breath, improved with Lasix.  spends significant time playing on the computer  She does take a pain pill occasionally to sleep better.  Valium for anxiety. severe bilateral knee pain.  Statin was held in the past for leg pain, Zocor.  Previous spinal tap at [facility] for high pressure in her ventricle. She reports no significant improvement after this procedure (drain?).  Placement of a bladder stimulator  implant for overactive bladder was denied despite cardiac clearance . 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  PSH:    Past Surgical History:  Procedure Laterality Date  . bladder injection    . botox bladder    . INSERT / REPLACE / REMOVE PACEMAKER     implant for SSS  . MELANOMA EXCISION     face  . MITRAL VALVE REPLACEMENT     s/p mechanical valve replaced with bovine valve 2010  . NASAL SINUS SURGERY      Current Outpatient Prescriptions  Medication Sig Dispense Refill  . albuterol (PROVENTIL HFA;VENTOLIN HFA) 108 (90 BASE) MCG/ACT inhaler Inhale 2 puffs into the lungs every 6 (six) hours as needed for wheezing.    [name] ascorbic acid (VITAMIN C) 500 MG tablet Take 500 mg by mouth daily.    [name] atorvastatin (LIPITOR) 80 MG tablet Take 80 mg by mouth daily.    . calcium-vitamin D (OSCAL-500) 500-400 MG-UNIT per tablet Take 1 tablet by mouth 2 (two) times daily.    . Cholecalciferol (VITAMIN D3) 1000 UNITS CAPS Take 2,000 Units by mouth every 30 (thirty) days.     . cyclobenzaprine (FLEXERIL) 10 MG tablet Take 10 mg by mouth at bedtime.     [name] DIAZEPAM PO Take 5 mg by mouth  as needed.     . diclofenac sodium (VOLTAREN) 1 % GEL Apply 1 application topically 2 (two) times daily.      [name]. diltiazem (CARDIZEM CD) 120 MG 24 hr capsule Take 1 capsule (120 mg total) by mouth daily. 90 capsule 3  . diltiazem (CARDIZEM CD) 120 MG 24 hr capsule TAKE 1 CAPSULE (120 MG TOTAL) BY MOUTH DAILY. 90 capsule 0  . Docusate Calcium (STOOL SOFTENER PO) Take 1 tablet by mouth 2 (two) times daily.    . DULoxetine (CYMBALTA) 30 MG capsule Take 60 mg by mouth daily.     . fluticasone (FLONASE) 50 MCG/ACT nasal spray Place into both  nostrils daily.    . furosemide (LASIX) 20 MG tablet Take 2 tablets (40 mg total) by mouth every other day. 90 tablet 3  . magnesium oxide (MAG-OX) 400 MG tablet Take 400 mg by mouth 2 (two) times daily.     . mirabegron ER (MYRBETRIQ) 50 MG TB24 tablet Take 50 mg by mouth daily.    . Multiple Vitamin (MULTIVITAMIN) tablet Take 1 tablet by mouth daily.    [name]. oxyCODONE-acetaminophen (PERCOCET) 5-325 MG per tablet Take 1 tablet by mouth every 4 (four) hours as needed.      . pantoprazole (PROTONIX) 40 MG tablet Take by mouth.    . potassium chloride (K-DUR) 10 MEQ tablet Take 1 tablet (10 mEq total) by mouth daily as needed. Take with lasix 90 tablet 3  . predniSONE (DELTASONE) 10 MG tablet 4 tabs on days 1-3, 2 tabs on days 4-5, 1 tab on days 6-7 18 tablet 0  . rOPINIRole (REQUIP) 2 MG tablet Take 2 mg by mouth at bedtime.     [name]. warfarin (COUMADIN) 2 MG tablet Take 2 tablets (4 mg total) by mouth daily. 120 tablet 6   No current facility-administered medications for this visit.      Allergies:   Morphine; Other; Statins; Tizanidine; Tape; Tapentadol; Cephalexin; Latex; and Sulfa antibiotics   Social History:  The patient  reports that she quit smoking about 46 years ago. Her smoking use included Cigarettes. She has a 5.00 pack-year smoking history. She has never used smokeless tobacco. She reports that she does not drink alcohol or use drugs.   Family History:   Family history is unknown by patient.    Review of Systems: Review of Systems  Constitutional: Positive for weight loss.  Respiratory: Negative.   Cardiovascular: Negative.   Gastrointestinal: Negative.   Musculoskeletal: Positive for myalgias.       Gait instability  Neurological: Negative.   Psychiatric/Behavioral: Negative.   All other systems reviewed and are negative.    PHYSICAL EXAM: VS:  There were no vitals taken for this visit. , BMI There is no height or weight on file to calculate BMI. GEN: Well nourished,  well developed, in no acute distress  HEENT: normal  Neck: no JVD, carotid bruits, or masses Cardiac: RRR; no murmurs, rubs, or gallops,no edema  Respiratory:  clear to auscultation bilaterally, normal work of breathing GI: soft, nontender, nondistended, + BS MS: no deformity or atrophy  Skin: warm and dry, no rash Neuro:  Strength and sensation are intact Psych: euthymic mood, full affect    Recent Labs: 03/28/2017: BUN 16; Creatinine, Ser 1.33; Hemoglobin 13.9; Magnesium 2.2; Platelets 277; Potassium 3.6; Sodium 144    Lipid Panel No results found for: CHOL, HDL, LDLCALC, TRIG    Wt Readings from Last 3 Encounters:  03/28/17 168 lb (76.2  kg)  03/02/17 180 lb (81.6 kg)  12/12/16 175 lb 4 oz (79.5 kg)       ASSESSMENT AND PLAN:  Atrial fibrillation, unspecified type (HCC) - Plan: EKG 12-Lead On anticoagulation, the paced rhythm No recent falls per the patient  Chronic diastolic heart failure (HCC) - Plan: EKG 12-Lead She has stopped her Lasix on her own, No leg edema on today's visit, appears euvolemic Recommended she take Lasix for any ankle swelling, shortness of breath  Essential hypertension - Plan: EKG 12-Lead Blood pressure elevated today. 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   Mitral valve replaced Repeat echocardiogram next year at her convenience She has declined checking this year   Total encounter time more than 25 minutes  Greater than 50% was spent in counseling and coordination of care with the patient   Disposition:   F/U  6 months   No  orders of the defined types were placed in this encounter.    Signed, Dossie Arbourim Talita Recht, M.D., Ph.D. 04/20/2017  [facility], [place] [phone]

## 2017-04-21 ENCOUNTER — Ambulatory Visit: Payer: Medicare Other | Admitting: Cardiovascular Disease

## 2017-05-02 ENCOUNTER — Telehealth: Payer: Self-pay | Admitting: Cardiology

## 2017-05-02 ENCOUNTER — Ambulatory Visit (INDEPENDENT_AMBULATORY_CARE_PROVIDER_SITE_OTHER): Payer: Self-pay | Admitting: *Deleted

## 2017-05-02 DIAGNOSIS — I495 Sick sinus syndrome: Secondary | ICD-10-CM

## 2017-05-02 NOTE — Telephone Encounter (Signed)
Spoke with pt and reminded pt of remote transmission that is due today. Pt verbalized understanding.   

## 2017-05-02 NOTE — Progress Notes (Signed)
Remote pacemaker transmission.   

## 2017-05-03 ENCOUNTER — Encounter: Payer: Self-pay | Admitting: Cardiology

## 2017-05-19 LAB — CUP PACEART REMOTE DEVICE CHECK
Battery Impedance: 4934 Ohm
Battery Remaining Longevity: 10 mo
Brady Statistic RV Percent Paced: 31 %
Implantable Lead Implant Date: 20090317
Implantable Lead Model: 5076
Lead Channel Impedance Value: 491 Ohm
Lead Channel Setting Pacing Amplitude: 2.5 V
Lead Channel Setting Pacing Pulse Width: 0.4 ms
Lead Channel Setting Sensing Sensitivity: 4 mV
MDC IDC LEAD LOCATION: 753860
MDC IDC MSMT BATTERY VOLTAGE: 2.68 V
MDC IDC MSMT LEADCHNL RA IMPEDANCE VALUE: 0 Ohm
MDC IDC MSMT LEADCHNL RV PACING THRESHOLD AMPLITUDE: 0.5 V
MDC IDC MSMT LEADCHNL RV PACING THRESHOLD PULSEWIDTH: 0.4 ms
MDC IDC PG IMPLANT DT: 20090317
MDC IDC SESS DTM: 20180807154245

## 2017-05-22 ENCOUNTER — Encounter: Payer: Self-pay | Admitting: Cardiology

## 2017-05-26 ENCOUNTER — Ambulatory Visit: Payer: Medicare Other | Admitting: Cardiovascular Disease

## 2017-06-05 ENCOUNTER — Telehealth: Payer: Self-pay | Admitting: Nurse Practitioner

## 2017-06-05 ENCOUNTER — Ambulatory Visit (INDEPENDENT_AMBULATORY_CARE_PROVIDER_SITE_OTHER): Payer: Self-pay | Admitting: *Deleted

## 2017-06-05 DIAGNOSIS — Z950 Presence of cardiac pacemaker: Secondary | ICD-10-CM

## 2017-06-05 NOTE — Progress Notes (Signed)
Remote pacemaker transmission.   

## 2017-06-05 NOTE — Telephone Encounter (Signed)
Monitor needs new batteries. [name] doesn't have new batteries at the moment, she will have to get some later and send the transmission later.

## 2017-06-05 NOTE — Telephone Encounter (Signed)
Pt states she is trying to send a transmission, but has some questions. Please call.

## 2017-06-06 LAB — CUP PACEART REMOTE DEVICE CHECK
Battery Impedance: 4943 Ohm
Brady Statistic RV Percent Paced: 34 %
Date Time Interrogation Session: 20180910144927
Implantable Lead Implant Date: 20090317
Lead Channel Impedance Value: 0 Ohm
Lead Channel Impedance Value: 478 Ohm
Lead Channel Setting Pacing Amplitude: 2.5 V
Lead Channel Setting Pacing Pulse Width: 0.4 ms
Lead Channel Setting Sensing Sensitivity: 4 mV
MDC IDC LEAD LOCATION: 753860
MDC IDC MSMT BATTERY REMAINING LONGEVITY: 10 mo
MDC IDC MSMT BATTERY VOLTAGE: 2.69 V
MDC IDC MSMT LEADCHNL RV PACING THRESHOLD AMPLITUDE: 0.5 V
MDC IDC MSMT LEADCHNL RV PACING THRESHOLD PULSEWIDTH: 0.4 ms
MDC IDC PG IMPLANT DT: 20090317

## 2017-06-07 ENCOUNTER — Encounter: Payer: Self-pay | Admitting: Cardiology

## 2017-06-15 ENCOUNTER — Ambulatory Visit (INDEPENDENT_AMBULATORY_CARE_PROVIDER_SITE_OTHER): Payer: Medicare Other | Admitting: Podiatry

## 2017-06-15 ENCOUNTER — Encounter: Payer: Self-pay | Admitting: Podiatry

## 2017-06-15 DIAGNOSIS — Q828 Other specified congenital malformations of skin: Secondary | ICD-10-CM

## 2017-06-15 DIAGNOSIS — M79676 Pain in unspecified toe(s): Secondary | ICD-10-CM

## 2017-06-15 DIAGNOSIS — B351 Tinea unguium: Secondary | ICD-10-CM | POA: Diagnosis not present

## 2017-06-15 NOTE — Progress Notes (Signed)
Complaint:  Visit Type: Patient returns to my office for continued preventative foot care services. Complaint: Patient states" my nails have grown long and thick and become painful to walk and wear shoes" Patient also has painful callus on both feet. The patient presents for preventative foot care services. No changes to ROS.  She also says she has developed blisters on the top of her right foot due to her footgear.  Finally, she has a painful corn on the fourth toe of the right foot.  Podiatric Exam: Vascular: dorsalis pedis and posterior tibial pulses are palpable bilateral. Capillary return is immediate. Temperature gradient is WNL. Skin turgor WNL  Sensorium: Normal Semmes Weinstein monofilament test. Normal tactile sensation bilaterally. Nail Exam: Pt has thick disfigured discolored nails with subungual debris noted bilateral entire nail hallux through fifth toenails Ulcer Exam: There is no evidence of ulcer or pre-ulcerative changes or infection. Orthopedic Exam: Muscle tone and strength are WNL. No limitations in general ROM. No crepitus or effusions noted. Foot type and digits show no abnormalities. Bony prominences are unremarkable. Plantarflexed fifth metatarsals  B/L Hammer toes 2-4  B/l. Skin:  Porokeratosis sub 5th met  B/l. No infection or ulcers. Heloma durum fourth toe right foot.  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 The patient understood the discussion of treatment and procedures well. All questions were answered thoroughly reviewed. Debridement of mycotic and hypertrophic toenails, 1 through 5 bilateral and clearing of subungual debris. Debridement of porokeratosis  B/L.  Debride HD right.  Return Visit-Office Procedure: Patient instructed to return to the office for a follow up visit 3 months for continued evaluation and  treatment.    Gregory Mayer DPM 

## 2017-06-21 ENCOUNTER — Encounter: Payer: Self-pay | Admitting: Cardiology

## 2017-06-27 ENCOUNTER — Ambulatory Visit: Payer: Medicare Other | Admitting: Nurse Practitioner

## 2017-07-04 ENCOUNTER — Telehealth: Payer: Self-pay | Admitting: Cardiology

## 2017-07-04 ENCOUNTER — Ambulatory Visit (INDEPENDENT_AMBULATORY_CARE_PROVIDER_SITE_OTHER): Payer: Medicare Other | Admitting: *Deleted

## 2017-07-04 DIAGNOSIS — I495 Sick sinus syndrome: Secondary | ICD-10-CM

## 2017-07-04 NOTE — Telephone Encounter (Signed)
LMOVM reminding pt to send remote transmission.   

## 2017-07-05 NOTE — Progress Notes (Signed)
Remote pacemaker transmission.   

## 2017-07-06 LAB — CUP PACEART REMOTE DEVICE CHECK
Battery Remaining Longevity: 9 mo
Brady Statistic RV Percent Paced: 38 %
Implantable Lead Implant Date: 20090317
Implantable Lead Model: 5076
Lead Channel Impedance Value: 467 Ohm
Lead Channel Pacing Threshold Amplitude: 0.5 V
Lead Channel Pacing Threshold Pulse Width: 0.4 ms
Lead Channel Setting Pacing Amplitude: 2.5 V
Lead Channel Setting Pacing Pulse Width: 0.4 ms
MDC IDC LEAD LOCATION: 753860
MDC IDC MSMT BATTERY IMPEDANCE: 5126 Ohm
MDC IDC MSMT BATTERY VOLTAGE: 2.69 V
MDC IDC MSMT LEADCHNL RA IMPEDANCE VALUE: 0 Ohm
MDC IDC PG IMPLANT DT: 20090317
MDC IDC SESS DTM: 20181009173452
MDC IDC SET LEADCHNL RV SENSING SENSITIVITY: 4 mV

## 2017-07-07 ENCOUNTER — Encounter: Payer: Self-pay | Admitting: Cardiology

## 2017-07-21 ENCOUNTER — Encounter: Payer: Self-pay | Admitting: Cardiology

## 2017-08-03 ENCOUNTER — Encounter: Payer: Medicare Other | Admitting: *Deleted

## 2017-08-03 ENCOUNTER — Telehealth: Payer: Self-pay | Admitting: Cardiology

## 2017-08-03 NOTE — Telephone Encounter (Signed)
LMOVM reminding pt to send remote transmission.   

## 2017-08-04 ENCOUNTER — Encounter: Payer: Self-pay | Admitting: Cardiology

## 2017-08-06 NOTE — Progress Notes (Signed)
Cardiology Office Note  Date:  08/07/2017   ID:  [name], Rhonda Griffith 30-Nov-1945, MRN [id number]  PCP:  Rhonda Lou, MD   Chief Complaint  Patient presents with  . other    6 month follow up. Patient c/o SOB "but not bad". Meds reviewed verbally with patient.     HPI:   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  bilateral knee replacements sleep apnea, not on CPAP She presents today for follow-up of her atrial fibrillation, stroke (Apr 28, 2015)  Some SOB Does not take lasix, urine incontinence  only takes as needed for ankle swelling weight stable Presents today in a wheelchair  Napping a lot, often sleeps through lunch Denies any leg swelling Continued gait instability Chronic leg pain Denies any symptoms concerning for claudication  Echo 12/2016 Ef normal Well functioning mitral valve  EKG showing V paced rhythm, rate 66 bpm  Other past medical history reviewed History of heavy vaginal bleeding, none recently Chronic left lower extremity toe drop, foot drop.  Previously had shortness of breath, improved with Lasix.  spends significant time playing on the computer  She does take a pain pill occasionally to sleep better.  Valium for anxiety. severe bilateral knee pain.  Statin was held in the past for leg pain, Zocor.  Previous spinal [facility] at [facility] for high pressure in her ventricle. She reports no significant improvement after this procedure (drain?).  Placement of a bladder stimulator implant for overactive bladder was denied despite cardiac clearance . 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  PSH:    Past Surgical History:  Procedure Laterality Date  . bladder injection    . botox bladder    . INSERT / REPLACE / REMOVE PACEMAKER     implant for SSS  . MELANOMA EXCISION     face  . MITRAL VALVE REPLACEMENT     s/p mechanical valve replaced with bovine valve 2010  . NASAL SINUS SURGERY      Current Outpatient Medications  Medication Sig Dispense Refill  . albuterol (PROVENTIL HFA;VENTOLIN HFA) 108 (90 BASE) MCG/ACT inhaler Inhale 2 puffs into the lungs every 6 (six) hours as needed for wheezing.    [name] ascorbic acid (VITAMIN C) 500 MG tablet Take 500 mg by mouth daily.    [name] atorvastatin (LIPITOR) 80 MG tablet Take 80 mg by mouth daily.    . calcium-vitamin D (OSCAL-500) 500-400 MG-UNIT per tablet Take 1 tablet by mouth 2 (two) times daily.    . Cholecalciferol (VITAMIN D3) 1000 UNITS CAPS Take 2,000 Units by mouth every 30 (thirty) days.     [name] DIAZEPAM PO Take 5 mg by mouth as needed.     . diclofenac sodium (VOLTAREN) 1 % GEL Apply 1 application topically 2 (two) times daily.      [name] diltiazem (CARDIZEM CD) 120 MG 24 hr capsule Take 1 capsule (120 mg  total) by mouth daily. 90 capsule 3  . Docusate Calcium (STOOL SOFTENER PO) Take 1 tablet by mouth 2 (two) times daily.    . DULoxetine (CYMBALTA) 30 MG capsule Take 60 mg by mouth daily.     . fluticasone (FLONASE) 50 MCG/ACT nasal spray Place into both nostrils daily.    . furosemide (LASIX) 20 MG tablet Take 2 tablets (40 mg total) by mouth every other day. 90 tablet 3  . magnesium oxide (MAG-OX) 400 MG tablet Take 400 mg by mouth 2 (two) times daily.     . mirabegron ER (MYRBETRIQ) 50 MG TB24 tablet Take 50 mg by mouth daily.    . Multiple Vitamin  (MULTIVITAMIN) tablet Take 1 tablet by mouth daily.    [name]. oxyCODONE-acetaminophen (PERCOCET) 5-325 MG per tablet Take 1 tablet by mouth every 4 (four) hours as needed.      . pantoprazole (PROTONIX) 40 MG tablet Take by mouth.    . potassium chloride (K-DUR) 10 MEQ tablet Take 1 tablet (10 mEq total) by mouth daily as needed. Take with lasix 90 tablet 3  . rOPINIRole (REQUIP) 2 MG tablet Take 2 mg by mouth at bedtime.     . TOVIAZ 8 MG TB24 tablet Take 8 mg 2 (two) times daily by mouth.    . warfarin (COUMADIN) 2 MG tablet Take 2 tablets (4 mg total) by mouth daily. 120 tablet 6   No current facility-administered medications for this visit.      Allergies:   Morphine; Other; Statins; Tizanidine; Tape; Tapentadol; Cephalexin; Latex; and Sulfa antibiotics   Social History:  The patient  reports that she quit smoking about 46 years ago. Her smoking use included cigarettes. She has a 5.00 pack-year smoking history. she has never used smokeless tobacco. She reports that she does not drink alcohol or use drugs.   Family History:   Family history is unknown by patient.    Review of Systems: Review of Systems  Constitutional: Negative.   Respiratory: Negative.   Cardiovascular: Negative.   Gastrointestinal: Negative.   Musculoskeletal: Positive for joint pain and myalgias.       Gait instability  Neurological: Negative.   Psychiatric/Behavioral: Negative.   All other systems reviewed and are negative.    PHYSICAL EXAM: VS:  BP (!) 158/80 (BP Location: Right Arm, Patient Position: Sitting, Cuff Size: Normal)   Pulse 66   Ht 5\' 9"  (1.753 m)   Wt 170 lb 4 oz (77.2 kg)   BMI 25.14 kg/m  , BMI Body mass index is 25.14 kg/m. GEN: Well nourished, well developed, in no acute distress , presenting in a wheelchair HEENT: normal  Neck: no JVD, carotid bruits, or masses Cardiac: Irregularly irregular,  no murmurs, rubs, or gallops,no edema  Respiratory:  clear to auscultation bilaterally,  normal work of breathing GI: soft, nontender, nondistended, + BS MS: no deformity or atrophy  Skin: warm and dry, no rash Neuro:  Strength and sensation are intact Psych: euthymic mood, full affect    Recent Labs: 03/28/2017: BUN 16; Creatinine, Ser 1.33; Hemoglobin 13.9; Magnesium 2.2; Platelets 277; Potassium 3.6; Sodium 144    Lipid Panel No results found for: CHOL, HDL, LDLCALC, TRIG    Wt Readings from Last 3 Encounters:  08/07/17 170 lb 4 oz (77.2 kg)  03/28/17 168 lb (76.2 kg)  03/02/17 180 lb (81.6 kg)       ASSESSMENT AND PLAN:  Atrial fibrillation, unspecified type (HCC) - Plan: EKG 12-Lead On anticoagulation,  paced rhythm Followed by Dr. Graciela HusbandsKlein  Chronic diastolic heart failure [facility](HCC) - Plan: EKG 12-Lead Previously stopped Lasix on her own given urinary incontinence No leg edema on today's visit, appears euvolemic but she does report some shortness of breath Recommend she take Lasix for symptoms of shortness of breath, leg swelling, abdominal bloating  Essential hypertension - Plan: EKG 12-Lead Blood pressure elevated today.  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 - Plan: EKG 12-Lead Recent echocardiogram April 2018 with stable valve disease  PAD (peripheral artery disease) (HCC) Mild depression of her ABI lower extremities, She has stable lower extremity pulses Leg pain likely noncardiac/noncardiovascular, chronic arthritis pain  Pure hypercholesterolemia Lipitor is on her list, will confirm she is taking We previously recommended she take half pill  Mitral valve replaced Stable on echo April 2018   Total encounter time more than 25 minutes  Greater than 50% was spent in counseling and coordination of care with the patient   Disposition:   F/U  12 months   Orders Placed This Encounter  Procedures  . EKG 12-Lead     Signed, Dossie Arbourim Adajah Cocking, M.D.,  Ph.D. 08/07/2017  [facility], [place] [phone]

## 2017-08-07 ENCOUNTER — Ambulatory Visit (INDEPENDENT_AMBULATORY_CARE_PROVIDER_SITE_OTHER): Payer: Medicare Other | Admitting: Cardiovascular Disease

## 2017-08-07 ENCOUNTER — Ambulatory Visit (INDEPENDENT_AMBULATORY_CARE_PROVIDER_SITE_OTHER): Payer: Self-pay | Admitting: *Deleted

## 2017-08-07 ENCOUNTER — Encounter: Payer: Self-pay | Admitting: Cardiovascular Disease

## 2017-08-07 VITALS — BP 158/80 | HR 66 | Ht 69.0 in | Wt 170.2 lb

## 2017-08-07 DIAGNOSIS — E782 Mixed hyperlipidemia: Secondary | ICD-10-CM

## 2017-08-07 DIAGNOSIS — I272 Pulmonary hypertension, unspecified: Secondary | ICD-10-CM | POA: Diagnosis not present

## 2017-08-07 DIAGNOSIS — Z952 Presence of prosthetic heart valve: Secondary | ICD-10-CM | POA: Diagnosis not present

## 2017-08-07 DIAGNOSIS — I482 Chronic atrial fibrillation: Secondary | ICD-10-CM

## 2017-08-07 DIAGNOSIS — I5032 Chronic diastolic (congestive) heart failure: Secondary | ICD-10-CM

## 2017-08-07 DIAGNOSIS — I739 Peripheral vascular disease, unspecified: Secondary | ICD-10-CM | POA: Diagnosis not present

## 2017-08-07 DIAGNOSIS — I639 Cerebral infarction, unspecified: Secondary | ICD-10-CM | POA: Diagnosis not present

## 2017-08-07 DIAGNOSIS — I080 Rheumatic disorders of both mitral and aortic valves: Secondary | ICD-10-CM | POA: Diagnosis not present

## 2017-08-07 DIAGNOSIS — Z950 Presence of cardiac pacemaker: Secondary | ICD-10-CM

## 2017-08-07 DIAGNOSIS — I10 Essential (primary) hypertension: Secondary | ICD-10-CM

## 2017-08-07 DIAGNOSIS — I4821 Permanent atrial fibrillation: Secondary | ICD-10-CM

## 2017-08-07 NOTE — Progress Notes (Signed)
Remote pacemaker transmission.   

## 2017-08-07 NOTE — Patient Instructions (Addendum)
Monitor blood pressure at home Take lasix/furosemide for worsening shortness of breath  Medication Instructions:   Consider holding Lipitor for leg pain  Labwork:  No new labs needed  Testing/Procedures:  No further testing at this time   Follow-Up: It was a pleasure seeing you in the office today. Please call us if you have new issues that need to be addressed before your next appt.  [phone]  Your physician wants you to follow-up in: 12 months.  You will receive a reminder letter in the mail two months in advance. If you don't receive a letter, please call our office to schedule the follow-up appointment.  If you need a refill on your cardiac medications before your next appointment, please call your pharmacy.

## 2017-08-08 LAB — CUP PACEART REMOTE DEVICE CHECK
Battery Impedance: 5287 Ohm
Battery Voltage: 2.66 V
Brady Statistic RV Percent Paced: 39 %
Date Time Interrogation Session: 20181112173553
Lead Channel Impedance Value: 0 Ohm
Lead Channel Impedance Value: 490 Ohm
Lead Channel Setting Pacing Pulse Width: 0.4 ms
Lead Channel Setting Sensing Sensitivity: 4 mV
MDC IDC LEAD IMPLANT DT: 20090317
MDC IDC LEAD LOCATION: 753860
MDC IDC MSMT BATTERY REMAINING LONGEVITY: 8 mo
MDC IDC MSMT LEADCHNL RV PACING THRESHOLD AMPLITUDE: 0.5 V
MDC IDC MSMT LEADCHNL RV PACING THRESHOLD PULSEWIDTH: 0.4 ms
MDC IDC PG IMPLANT DT: 20090317
MDC IDC SET LEADCHNL RV PACING AMPLITUDE: 2.5 V

## 2017-08-11 ENCOUNTER — Encounter: Payer: Self-pay | Admitting: Cardiology

## 2017-08-14 ENCOUNTER — Telehealth: Payer: Self-pay | Admitting: Internal Medicine

## 2017-08-14 NOTE — Telephone Encounter (Signed)
Spoke with pt and informed her that a transmission was received on 11/12 and that her next transmission is scheduled for 12/13~ pt voiced understanding.

## 2017-08-14 NOTE — Telephone Encounter (Signed)
New message   Patient calling to confirm transmission received on 11/9  1. Has your device fired? NO  2. Is you device beeping? NO  3. Are you experiencing draining or swelling at device site? NO  4. Are you calling to see if we received your device transmission? YES, sent on 11/9  5. Have you passed out? NO    Please route to Device Clinic Pool

## 2017-08-24 ENCOUNTER — Encounter: Payer: Self-pay | Admitting: Podiatry

## 2017-08-24 ENCOUNTER — Ambulatory Visit (INDEPENDENT_AMBULATORY_CARE_PROVIDER_SITE_OTHER): Payer: Medicare Other | Admitting: Podiatry

## 2017-08-24 DIAGNOSIS — Q828 Other specified congenital malformations of skin: Secondary | ICD-10-CM | POA: Diagnosis not present

## 2017-08-24 DIAGNOSIS — M79676 Pain in unspecified toe(s): Secondary | ICD-10-CM | POA: Diagnosis not present

## 2017-08-24 DIAGNOSIS — R2681 Unsteadiness on feet: Secondary | ICD-10-CM

## 2017-08-24 DIAGNOSIS — R531 Weakness: Secondary | ICD-10-CM

## 2017-08-24 DIAGNOSIS — B351 Tinea unguium: Secondary | ICD-10-CM | POA: Diagnosis not present

## 2017-08-24 NOTE — Progress Notes (Signed)
Complaint:  Visit Type: Patient returns to my office for continued preventative foot care services. Complaint: Patient states" my nails have grown long and thick and become painful to walk and wear shoes" Patient also has painful callus on both feet. The patient presents for preventative foot care services. No changes to ROS.  She says she also is concerned about the weakness in her left foot and possibly falling. She has been diagnosed with gait instability and movement disorder.  She has left sided weakness.  Podiatric Exam: Vascular: dorsalis pedis and posterior tibial pulses are palpable right . Dorsalis pedis and posterior tibial pulses are not palpable.. Capillary return is immediate. Temperature gradient is WNL. Skin turgor WNL  Sensorium: Normal Semmes Weinstein monofilament test. Normal tactile sensation bilaterally. Nail Exam: Pt has thick disfigured discolored nails with subungual debris noted bilateral entire nail hallux through fifth toenails Ulcer Exam: There is no evidence of ulcer or pre-ulcerative changes or infection. Orthopedic Exam: Muscle tone and strength are WNL. No limitations in general ROM. No crepitus or effusions noted. Foot type and digits show no abnormalities. Bony prominences are unremarkable. Plantarflexed fifth metatarsals  B/L Hammer toes 2-4  B/l.Significant muscle weakness left foot.  Skin:  Porokeratosis sub 5th met  B/l. No infection or ulcers.  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 The patient understood the discussion of treatment and procedures well. All questions were answered thoroughly reviewed. Debridement of mycotic and hypertrophic toenails, 1 through 5 bilateral and clearing of subungual debris. Debridement of porokeratosis  B/L. Patient voiced her concern about falling. due to her left foot  weakness. I recommended she be evaluated by Liliane Channel  to see what he might suggest.  RTC 3 months for nail care. Return Visit-Office Procedure: Patient instructed to return to the office for a follow up visit 3 months for continued evaluation and treatment.    Gardiner Barefoot DPM

## 2017-08-29 ENCOUNTER — Other Ambulatory Visit: Payer: Medicare Other | Admitting: Orthotics

## 2017-09-07 ENCOUNTER — Ambulatory Visit (INDEPENDENT_AMBULATORY_CARE_PROVIDER_SITE_OTHER): Payer: Self-pay | Admitting: *Deleted

## 2017-09-07 ENCOUNTER — Telehealth: Payer: Self-pay | Admitting: Cardiology

## 2017-09-07 DIAGNOSIS — Z950 Presence of cardiac pacemaker: Secondary | ICD-10-CM

## 2017-09-07 LAB — CUP PACEART REMOTE DEVICE CHECK
Battery Remaining Longevity: 8 mo
Implantable Lead Implant Date: 20090317
Implantable Lead Model: 5076
Lead Channel Pacing Threshold Amplitude: 0.5 V
Lead Channel Pacing Threshold Pulse Width: 0.4 ms
MDC IDC LEAD LOCATION: 753860
MDC IDC MSMT BATTERY IMPEDANCE: 5408 Ohm
MDC IDC MSMT BATTERY VOLTAGE: 2.67 V
MDC IDC MSMT LEADCHNL RA IMPEDANCE VALUE: 0 Ohm
MDC IDC MSMT LEADCHNL RV IMPEDANCE VALUE: 493 Ohm
MDC IDC PG IMPLANT DT: 20090317
MDC IDC SESS DTM: 20181213203935
MDC IDC SET LEADCHNL RV PACING AMPLITUDE: 2.5 V
MDC IDC SET LEADCHNL RV PACING PULSEWIDTH: 0.4 ms
MDC IDC SET LEADCHNL RV SENSING SENSITIVITY: 4 mV
MDC IDC STAT BRADY RV PERCENT PACED: 39 %

## 2017-09-07 NOTE — Telephone Encounter (Signed)
Spoke with pt and reminded pt of remote transmission that is due today. Pt verbalized understanding.   

## 2017-09-07 NOTE — Progress Notes (Signed)
Remote pacemaker transmission.   

## 2017-09-12 ENCOUNTER — Encounter: Payer: Self-pay | Admitting: Cardiology

## 2017-09-12 ENCOUNTER — Ambulatory Visit: Payer: Medicare Other | Admitting: Orthotics

## 2017-09-12 DIAGNOSIS — Z8673 Personal history of transient ischemic attack (TIA), and cerebral infarction without residual deficits: Secondary | ICD-10-CM

## 2017-09-12 DIAGNOSIS — R531 Weakness: Secondary | ICD-10-CM

## 2017-09-12 DIAGNOSIS — R2681 Unsteadiness on feet: Secondary | ICD-10-CM

## 2017-09-12 NOTE — Progress Notes (Signed)
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 2/5 verse 4/5...invertors were also weak.  Based upon assessment I would agree that an articulated ankle afo would be in order.     Ordering [place] breeze w/ articulated ankle joints t[place]ak dorsi assist.

## 2017-09-25 ENCOUNTER — Encounter: Payer: Self-pay | Admitting: Podiatry

## 2017-09-25 ENCOUNTER — Ambulatory Visit (INDEPENDENT_AMBULATORY_CARE_PROVIDER_SITE_OTHER): Payer: Medicare Other | Admitting: Podiatry

## 2017-09-25 DIAGNOSIS — Q828 Other specified congenital malformations of skin: Secondary | ICD-10-CM

## 2017-09-25 DIAGNOSIS — M722 Plantar fascial fibromatosis: Secondary | ICD-10-CM | POA: Diagnosis not present

## 2017-09-25 DIAGNOSIS — I639 Cerebral infarction, unspecified: Secondary | ICD-10-CM | POA: Diagnosis not present

## 2017-09-25 NOTE — Progress Notes (Signed)
This patient presents the office today stating that she has been experiencing burning through the bottom of both feet for the last 2-3 days.  She says she has been very active and has increased her walking.  She already has been diagnosed with gait instability and a movement disorder with a left-sided weakness.  She has over-the-counter insoles in her shoes, but they are not very functional in controlling her feet.  She presents the office today for an evaluation and treatment of this burning and requests that I worked on her painful callus  right foot .     General Appearance  Alert, conversant and in no acute stress.  Vascular  Dorsalis pedis and posterior pulses are palpable  Right foot.  Dorsalis pedis and posterior tibial pulses are absent left foot..  Capillary return is within normal limits  bilaterally. Temperature is within normal limits  Bilaterally.  Neurologic  Senn-Weinstein monofilament wire test within normal limits  bilaterally. Muscle power within normal limits bilaterally.  Nails Thick disfigured discolored nails with subungual debris bilaterally from hallux to fifth toes bilaterally. No evidence of bacterial infection or drainage bilaterally.  Orthopedic  No limitations of motion of motion feet bilaterally.  No crepitus or effusions noted.  Palpable pain along the course of the plantar fascia extending from the heel to the forefoot.  Patient has marked cavus feet bilaterally.  Skin  Porokeratotic skin lesion sub 5th metatarsal B/L.    No signs of infections or ulcers noted.   Plantar fascitis with cavus foot type  B/L  Porokeratosis  Right foot.  ROV.  Debridement of porokeratosis right foot.  Patient was dispensed a plantar fascial braces to be worn on both feet to help to control and support the plantar fascia both feet.  She is to return to the office for preventative foot care services as previously scheduled.   Helane GuntherGregory Amaryllis Malmquist DPM

## 2017-10-03 ENCOUNTER — Telehealth: Payer: Self-pay | Admitting: Cardiovascular Disease

## 2017-10-03 NOTE — Telephone Encounter (Signed)
Spoke with patient and she states that she developed some swelling to her right foot. She reports that she did take additional furosemide and just wanted us to be aware. Instructed her to wear some compression socks/hose, elevated her feet when sitting, and reviewed low sodium along with monitoring fluids as well. She verbalized understanding of our conversation, agreement with plan, and had no further questions at this time.

## 2017-10-03 NOTE — Telephone Encounter (Signed)
Pt calling stating her right foot is swollen.  She states she is taking Lasix 40 mg  This started yesterday  Would like some advise on this  Please call back

## 2017-10-09 ENCOUNTER — Ambulatory Visit (INDEPENDENT_AMBULATORY_CARE_PROVIDER_SITE_OTHER): Payer: Medicare HMO | Admitting: *Deleted

## 2017-10-09 DIAGNOSIS — I495 Sick sinus syndrome: Secondary | ICD-10-CM

## 2017-10-11 ENCOUNTER — Ambulatory Visit (INDEPENDENT_AMBULATORY_CARE_PROVIDER_SITE_OTHER): Payer: Medicare Other | Admitting: Orthotics

## 2017-10-11 DIAGNOSIS — R531 Weakness: Secondary | ICD-10-CM

## 2017-10-11 DIAGNOSIS — R2681 Unsteadiness on feet: Secondary | ICD-10-CM

## 2017-10-11 DIAGNOSIS — L84 Corns and callosities: Secondary | ICD-10-CM

## 2017-10-11 DIAGNOSIS — Z8673 Personal history of transient ischemic attack (TIA), and cerebral infarction without residual deficits: Secondary | ICD-10-CM

## 2017-10-11 LAB — CUP PACEART REMOTE DEVICE CHECK
Battery Impedance: 5332 Ohm
Battery Voltage: 2.66 V
Implantable Lead Location: 753860
Implantable Lead Model: 5076
Lead Channel Impedance Value: 0 Ohm
Lead Channel Setting Pacing Amplitude: 2.5 V
Lead Channel Setting Pacing Pulse Width: 0.4 ms
Lead Channel Setting Sensing Sensitivity: 4 mV
MDC IDC LEAD IMPLANT DT: 20090317
MDC IDC MSMT BATTERY REMAINING LONGEVITY: 8 mo
MDC IDC MSMT LEADCHNL RV IMPEDANCE VALUE: 469 Ohm
MDC IDC MSMT LEADCHNL RV PACING THRESHOLD AMPLITUDE: 0.5 V
MDC IDC MSMT LEADCHNL RV PACING THRESHOLD PULSEWIDTH: 0.4 ms
MDC IDC PG IMPLANT DT: 20090317
MDC IDC SESS DTM: 20190115191458
MDC IDC STAT BRADY RV PERCENT PACED: 40 %

## 2017-10-11 NOTE — Progress Notes (Signed)
Patient  came in to pick up [place] brace w/ dorsi assist springs.  The brace fit well and immediately patient's  gait approved.  The brace provided desired m-l stability in frontal/transverse planes and aided in dorsiflexion in saggital plane. Patient was able to don and doff brace with minimal difficulty.  Overall patient pleased with fit and functionality of brace.  

## 2017-10-11 NOTE — Progress Notes (Signed)
Remote pacemaker transmission.   

## 2017-10-13 ENCOUNTER — Encounter: Payer: Self-pay | Admitting: Cardiology

## 2017-10-18 ENCOUNTER — Other Ambulatory Visit: Payer: Medicare Other | Admitting: Orthotics

## 2017-11-06 ENCOUNTER — Ambulatory Visit: Payer: Medicare Other | Admitting: Podiatry

## 2017-11-13 ENCOUNTER — Ambulatory Visit (INDEPENDENT_AMBULATORY_CARE_PROVIDER_SITE_OTHER): Payer: Self-pay | Admitting: *Deleted

## 2017-11-13 DIAGNOSIS — I495 Sick sinus syndrome: Secondary | ICD-10-CM

## 2017-11-14 LAB — CUP PACEART REMOTE DEVICE CHECK
Battery Impedance: 5846 Ohm
Brady Statistic RV Percent Paced: 40 %
Lead Channel Impedance Value: 0 Ohm
Lead Channel Impedance Value: 486 Ohm
Lead Channel Pacing Threshold Amplitude: 0.5 V
Lead Channel Pacing Threshold Pulse Width: 0.4 ms
Lead Channel Setting Pacing Amplitude: 2.5 V
Lead Channel Setting Pacing Pulse Width: 0.4 ms
Lead Channel Setting Sensing Sensitivity: 4 mV
MDC IDC LEAD IMPLANT DT: 20090317
MDC IDC LEAD LOCATION: 753860
MDC IDC MSMT BATTERY REMAINING LONGEVITY: 6 mo
MDC IDC MSMT BATTERY VOLTAGE: 2.68 V
MDC IDC PG IMPLANT DT: 20090317
MDC IDC SESS DTM: 20190218200237

## 2017-11-14 NOTE — Progress Notes (Signed)
Remote pacemaker transmission.   

## 2017-11-16 ENCOUNTER — Encounter: Payer: Self-pay | Admitting: Cardiology

## 2017-11-20 ENCOUNTER — Ambulatory Visit: Payer: Medicare Other | Admitting: Podiatry

## 2017-12-04 ENCOUNTER — Ambulatory Visit: Payer: Medicare Other | Admitting: Podiatry

## 2017-12-13 ENCOUNTER — Telehealth: Payer: Self-pay | Admitting: Cardiovascular Disease

## 2017-12-13 NOTE — Telephone Encounter (Signed)
Pt calling asking if we may write a letter for her to be put under sedation for her dentist  She states she needs her teeth to be pulled and would like to get dentures but needs a letter prior to this   Please call back

## 2017-12-13 NOTE — Telephone Encounter (Signed)
Spoke with patient and she wanted us to write a letter so that her dentist will have staff available if she should suffer from a cardiac arrest. Reviewed that we do not know the services they provide in that setting. Advised that when she goes for her appointment to ask those questions and then have them send us a request for clearance and instructions for her medications. She verbalized understanding with no further questions at this time.

## 2017-12-14 ENCOUNTER — Encounter: Payer: Medicare Other | Admitting: *Deleted

## 2017-12-14 ENCOUNTER — Telehealth: Payer: Self-pay | Admitting: Cardiology

## 2017-12-14 NOTE — Telephone Encounter (Signed)
LMOVM reminding pt to send remote transmission.   

## 2017-12-15 ENCOUNTER — Encounter: Payer: Self-pay | Admitting: Cardiology

## 2017-12-18 ENCOUNTER — Ambulatory Visit (INDEPENDENT_AMBULATORY_CARE_PROVIDER_SITE_OTHER): Payer: Medicare Other | Admitting: Podiatry

## 2017-12-18 ENCOUNTER — Encounter: Payer: Self-pay | Admitting: Podiatry

## 2017-12-18 ENCOUNTER — Ambulatory Visit (INDEPENDENT_AMBULATORY_CARE_PROVIDER_SITE_OTHER): Payer: Self-pay | Admitting: *Deleted

## 2017-12-18 DIAGNOSIS — Q828 Other specified congenital malformations of skin: Secondary | ICD-10-CM

## 2017-12-18 DIAGNOSIS — B351 Tinea unguium: Secondary | ICD-10-CM

## 2017-12-18 DIAGNOSIS — M79676 Pain in unspecified toe(s): Secondary | ICD-10-CM | POA: Diagnosis not present

## 2017-12-18 DIAGNOSIS — I495 Sick sinus syndrome: Secondary | ICD-10-CM

## 2017-12-18 NOTE — Progress Notes (Signed)
Remote pacemaker transmission.   

## 2017-12-18 NOTE — Progress Notes (Signed)
This patient presents the office today stating that she has been experiencing burning through the bottom of both feet for the last 2-3 days.  She says she has been very active and has increased her walking.  She already has been diagnosed with gait instability and a movement disorder with a left-sided weakness.  She has over-the-counter insoles in her shoes, but they are not very functional in controlling her feet.  She presents the office today for an evaluation and treatment of this burning and requests that I worked on her painful callus  right foot .     General Appearance  Alert, conversant and in no acute stress.  Vascular  Dorsalis pedis and posterior pulses are palpable  Right foot.  Dorsalis pedis and posterior tibial pulses are absent left foot..  Capillary return is within normal limits  bilaterally. Temperature is within normal limits  Bilaterally.  Neurologic  Senn-Weinstein monofilament wire test within normal limits  bilaterally. Muscle power within normal limits bilaterally.  Nails Thick disfigured discolored nails with subungual debris bilaterally from hallux to fifth toes bilaterally. No evidence of bacterial infection or drainage bilaterally.  Orthopedic  No limitations of motion of motion feet bilaterally.  No crepitus or effusions noted.  Palpable pain along the course of the plantar fascia extending from the heel to the forefoot.  Patient has marked cavus feet bilaterally.  Skin  Porokeratotic skin lesion sub 5th metatarsal B/L.    No signs of infections or ulcers noted.   Plantar fascitis with cavus foot type  B/L  Porokeratosis  B/L.  ROV.  Debridement of porokeratosis .  Debridement of nails  B/L. She is to return to the office for preventative foot care services as previously scheduled.   Helane GuntherGregory Urbano Milhouse DPM

## 2017-12-18 NOTE — Telephone Encounter (Signed)
Noted. Transmission received

## 2017-12-18 NOTE — Telephone Encounter (Signed)
Patient called office to let us know transmission was just now sent

## 2017-12-19 ENCOUNTER — Encounter: Payer: Self-pay | Admitting: Cardiology

## 2017-12-19 LAB — CUP PACEART REMOTE DEVICE CHECK
Battery Impedance: 5878 Ohm
Battery Voltage: 2.67 V
Brady Statistic RV Percent Paced: 41 %
Implantable Lead Location: 753860
Implantable Lead Model: 5076
Lead Channel Impedance Value: 0 Ohm
Lead Channel Pacing Threshold Pulse Width: 0.4 ms
Lead Channel Setting Pacing Amplitude: 2.5 V
Lead Channel Setting Pacing Pulse Width: 0.4 ms
Lead Channel Setting Sensing Sensitivity: 2.8 mV
MDC IDC LEAD IMPLANT DT: 20090317
MDC IDC MSMT BATTERY REMAINING LONGEVITY: 6 mo
MDC IDC MSMT LEADCHNL RV IMPEDANCE VALUE: 468 Ohm
MDC IDC MSMT LEADCHNL RV PACING THRESHOLD AMPLITUDE: 0.5 V
MDC IDC PG IMPLANT DT: 20090317
MDC IDC SESS DTM: 20190325165328

## 2018-01-18 ENCOUNTER — Ambulatory Visit (INDEPENDENT_AMBULATORY_CARE_PROVIDER_SITE_OTHER): Payer: Medicare Other | Admitting: *Deleted

## 2018-01-18 DIAGNOSIS — I495 Sick sinus syndrome: Secondary | ICD-10-CM

## 2018-01-19 ENCOUNTER — Telehealth: Payer: Self-pay | Admitting: Cardiology

## 2018-01-19 ENCOUNTER — Encounter: Payer: Self-pay | Admitting: Cardiology

## 2018-01-19 NOTE — Telephone Encounter (Signed)
LMOVM for pt to informing her that we did receive her remote transmission.

## 2018-01-19 NOTE — Progress Notes (Signed)
Remote pacemaker transmission.   

## 2018-01-19 NOTE — Telephone Encounter (Signed)
Patient called office to let us know transmission was just now sent  °

## 2018-02-09 LAB — CUP PACEART REMOTE DEVICE CHECK
Battery Remaining Longevity: 4 mo
Battery Voltage: 2.66 V
Date Time Interrogation Session: 20190426171404
Implantable Lead Implant Date: 20090317
Implantable Lead Model: 5076
Implantable Pulse Generator Implant Date: 20090317
Lead Channel Pacing Threshold Amplitude: 0.625 V
Lead Channel Pacing Threshold Pulse Width: 0.4 ms
Lead Channel Setting Pacing Pulse Width: 0.4 ms
MDC IDC LEAD LOCATION: 753860
MDC IDC MSMT BATTERY IMPEDANCE: 6155 Ohm
MDC IDC MSMT LEADCHNL RA IMPEDANCE VALUE: 0 Ohm
MDC IDC MSMT LEADCHNL RV IMPEDANCE VALUE: 494 Ohm
MDC IDC SET LEADCHNL RV PACING AMPLITUDE: 2.5 V
MDC IDC SET LEADCHNL RV SENSING SENSITIVITY: 2.8 mV
MDC IDC STAT BRADY RV PERCENT PACED: 42 %

## 2018-02-21 ENCOUNTER — Ambulatory Visit (INDEPENDENT_AMBULATORY_CARE_PROVIDER_SITE_OTHER): Payer: Medicare Other | Admitting: *Deleted

## 2018-02-21 DIAGNOSIS — I4891 Unspecified atrial fibrillation: Secondary | ICD-10-CM

## 2018-02-21 DIAGNOSIS — I495 Sick sinus syndrome: Secondary | ICD-10-CM

## 2018-02-23 LAB — CUP PACEART REMOTE DEVICE CHECK
Battery Impedance: 6297 Ohm
Battery Remaining Longevity: 4 mo
Battery Voltage: 2.67 V
Brady Statistic RV Percent Paced: 44 %
Implantable Lead Implant Date: 20090317
Implantable Lead Location: 753860
Implantable Pulse Generator Implant Date: 20090317
Lead Channel Impedance Value: 0 Ohm
Lead Channel Impedance Value: 501 Ohm
Lead Channel Pacing Threshold Pulse Width: 0.4 ms
Lead Channel Setting Pacing Amplitude: 2.5 V
Lead Channel Setting Pacing Pulse Width: 0.4 ms
Lead Channel Setting Sensing Sensitivity: 4 mV
MDC IDC MSMT LEADCHNL RV PACING THRESHOLD AMPLITUDE: 0.5 V
MDC IDC SESS DTM: 20190529171540

## 2018-02-23 NOTE — Progress Notes (Signed)
Remote pacemaker transmission.   

## 2018-03-22 ENCOUNTER — Ambulatory Visit: Payer: Medicare Other | Admitting: Podiatry

## 2018-03-24 ENCOUNTER — Ambulatory Visit
Admission: EM | Admit: 2018-03-24 | Discharge: 2018-03-24 | Disposition: A | Discharge status: Home / Self Care | Payer: Medicare Other | Attending: Family Medicine | Admitting: Family Medicine

## 2018-03-24 ENCOUNTER — Ambulatory Visit (INDEPENDENT_AMBULATORY_CARE_PROVIDER_SITE_OTHER): Payer: Medicare Other

## 2018-03-24 ENCOUNTER — Other Ambulatory Visit: Payer: Self-pay

## 2018-03-24 DIAGNOSIS — R05 Cough: Secondary | ICD-10-CM | POA: Diagnosis not present

## 2018-03-24 DIAGNOSIS — J069 Acute upper respiratory infection, unspecified: Secondary | ICD-10-CM

## 2018-03-24 DIAGNOSIS — R059 Cough, unspecified: Secondary | ICD-10-CM

## 2018-03-24 MED ORDER — BENZONATATE 100 MG PO CAPS: 100.0000 mg | ORAL_CAPSULE | Freq: Three times a day (TID) | ORAL | 0 refills | Status: DC | PRN

## 2018-03-24 MED ORDER — IPRATROPIUM BROMIDE 0.06 % NA SOLN: 2.0000 | Freq: Two times a day (BID) | NASAL | 12 refills | Status: DC

## 2018-03-24 NOTE — ED Provider Notes (Signed)
[facility]    CSN: [id number] Arrival date & time: 03/24/18  1015     History   Chief Complaint Chief Complaint  Patient presents with  . Cough    HPI [place] Kieth Brightlyleanor Agent is a 72 y.o. female.   [place] presents with complaints of cough for the past two days, causing poor sleep. Productive of sputum. Causing sore throat. No runny nose, ear pain, fevers or shortness of breath. No leg swelling. No known ill contacts. No gi/gu complaints. Took tylenol and benadryl which have helped. No chest pain or palpitations. Hx afib, ckd, cva, pacemaker, PAD, mitral valve replacement. She is on coumadin. Does not use flonase regularly.     ROS per HPI.      Past Medical History:  Diagnosis Date  . Atrial fibrillation, permanent (HCC)    a. CHA2DS2VASc = 4-->coumadin;  b. 09/2014 Echo: EF 55-60%, normal wall motion, mild AI/AS, nl MV, mod dil LA, mildly dil RA, mild to mod TR, PASP 47mmHg.  [name]. Chronic kidney disease, stage I   . CVA (cerebral infarction)    a. 04/2015 - anticoagulation switched from eliquis to xarelto to coumadin.  . Leukocytosis   . Pacemaker -Medtronic    a. implant for SSS  . PAD (peripheral artery disease) (HCC)    a. w/ left lower ext claudication s/p ABI's 04/2015 showing nl ABI on Right with abnl waveforms on left sugg of L SFA dzs.  [name]. Restless leg syndrome   . 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    Patient Active Problem List   Diagnosis Date Noted  . ERRONEOUS ENCOUNTER--DISREGARD 07/03/2016  . Carotid bruit 12/22/2015  . Hyperlipidemia 10/23/2015  . Atrial fibrillation, permanent (HCC)   . Pacemaker -Medtronic   . 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 Chronic kidney disease, unspecified   . Encounter for anticoagulation discussion and counseling 06/25/2015  . Acute cardioembolic stroke (HCC) 06/25/2015  . PAD (peripheral artery disease) (HCC)  06/25/2015  . Pure hypercholesterolemia 04/30/2015  . History of recent stroke 04/28/2015  . Essential hypertension 12/09/2014  . Gait instability 09/08/2014  . Status post total bilateral knee replacement 06/24/2014  . UTI (urinary tract infection) 02/03/2014  . Fatigue 10/01/2013  . Shortness of breath 09/12/2012  . Stress disorder, acute 09/12/2012  . Tachycardia 09/12/2012  . Atrial fibrillation (HCC) 09/12/2012  . Melanoma in situ (HCC) 07/11/2012  . Morphea 07/11/2012  . Long term (current) use of anticoagulants 07/02/2012  . History of malignant melanoma of skin 05/30/2012  . CMC arthritis 02/02/2012  . Sprain of MCL (medial collateral ligament) of knee 01/24/2012  . Squamous cell carcinoma 12/21/2011  . Carpal tunnel syndrome of left wrist 11/28/2011  . Sinoatrial node dysfunction (HCC)   . Movement disorder 08/08/2011  . Edema 06/07/2011  . Chronic diastolic heart failure (HCC) 05/27/2011  . Anxiety state 05/14/2011  . Esophageal reflux 05/14/2011  . Mitral valve replaced 05/14/2011  . Myalgia and myositis, unspecified 05/14/2011  . OSA (obstructive sleep apnea) 05/14/2011  . Osteoarthritis 05/14/2011  . Pulmonary hypertension (HCC) 05/14/2011  . Cardiac pacemaker in situ 10/03/2010    Past Surgical History:  Procedure Laterality Date  . bladder injection    . botox bladder    . INSERT / REPLACE / REMOVE PACEMAKER     implant for SSS  . MELANOMA EXCISION     face  . MITRAL  VALVE REPLACEMENT     s/p mechanical valve replaced with bovine valve 2010  . NASAL SINUS SURGERY      OB History   None      Home Medications    Prior to Admission medications   Medication Sig Start Date End Date Taking? Authorizing Provider  albuterol (PROVENTIL HFA;VENTOLIN HFA) 108 (90 BASE) MCG/ACT inhaler Inhale 2 puffs into the lungs every 6 (six) hours as needed for wheezing.    [provider]  ascorbic acid (VITAMIN C) 500 MG tablet Take 500 mg by mouth daily.     [provider]  atorvastatin (LIPITOR) 80 MG tablet Take 80 mg by mouth daily.    [provider]  benzonatate (TESSALON PERLES) 100 MG capsule Take 1 capsule (100 mg total) by mouth 3 (three) times daily as needed for cough. 03/24/18   Georgetta Haber, NP  calcium-vitamin D (OSCAL-500) 500-400 MG-UNIT per tablet Take 1 tablet by mouth 2 (two) times daily.    [provider]  Cholecalciferol (VITAMIN D3) 1000 UNITS CAPS Take 2,000 Units by mouth every 30 (thirty) days.     [provider]  DIAZEPAM PO Take 5 mg by mouth as needed.     [provider]  diclofenac sodium (VOLTAREN) 1 % GEL Apply 1 application topically 2 (two) times daily.      [provider]  diltiazem (CARDIZEM CD) 120 MG 24 hr capsule Take 1 capsule (120 mg total) by mouth daily. 09/06/16   Antonieta Iba, MD  Docusate Calcium (STOOL SOFTENER PO) Take 1 tablet by mouth 2 (two) times daily.    [provider]  DULoxetine (CYMBALTA) 30 MG capsule Take 60 mg by mouth daily.     [provider]  fluticasone (FLONASE) 50 MCG/ACT nasal spray Place into both nostrils daily.    [provider]  furosemide (LASIX) 20 MG tablet Take 2 tablets (40 mg total) by mouth every other day. 09/06/16   Duke Salvia, MD  ipratropium (ATROVENT) 0.06 % nasal spray Place 2 sprays into both nostrils 2 (two) times daily. 03/24/18   Linus Mako B, NP  magnesium oxide (MAG-OX) 400 MG tablet Take 400 mg by mouth 2 (two) times daily.     [provider]  mirabegron ER (MYRBETRIQ) 50 MG TB24 tablet Take 50 mg by mouth daily. 09/02/16   [provider]  Multiple Vitamin (MULTIVITAMIN) tablet Take 1 tablet by mouth daily.    [provider]  oxyCODONE-acetaminophen (PERCOCET) 5-325 MG per tablet Take 1 tablet by mouth every 4 (four) hours as needed.      [provider]  pantoprazole (PROTONIX) 40 MG tablet Take by mouth. 05/28/15   [provider]  potassium chloride (K-DUR) 10 MEQ tablet Take 1 tablet (10 mEq total) by mouth daily as needed. Take with lasix 09/06/16   Antonieta Iba, MD  rOPINIRole (REQUIP) 2 MG tablet Take 2 mg by mouth at bedtime.  05/29/15   [provider]  TOVIAZ 8 MG TB24 tablet Take 8 mg 2 (two) times daily by mouth. 08/04/17   [provider]  warfarin (COUMADIN) 2 MG tablet Take 2 tablets (4 mg total) by mouth daily. 06/25/15   Antonieta Iba, MD    Family History Family History  Family history unknown: Yes    Social History Social History   Tobacco Use  . Smoking status: Former Smoker    Packs/day: 1.00  Years: 5.00    Pack years: 5.00    Types: Cigarettes    Last attempt to quit: 12/29/1970    Years since quitting: 47.2  . Smokeless tobacco: Never Used  Substance Use Topics  . Alcohol use: No  . Drug use: No     Allergies   Morphine; Other; Statins; Tizanidine; Tape; Tapentadol; Cephalexin; Latex; and Sulfa antibiotics   Review of Systems Review of Systems   Physical Exam Triage Vital Signs ED Triage Vitals  Enc Vitals Group     BP 03/24/18 1026 (!) 140/126     Pulse Rate 03/24/18 1026 83     Resp 03/24/18 1026 20     Temp 03/24/18 1026 97.9 F (36.6 C)     Temp Source 03/24/18 1026 Oral     SpO2 03/24/18 1026 100 %     Weight 03/24/18 1025 165 lb (74.8 kg)     Height 03/24/18 1025 5\' 9"  (1.753 m)     Head Circumference --      Peak Flow --      Pain Score 03/24/18 1025 9     Pain Loc --      Pain Edu? --      Excl. in GC? --    No data found.  Updated Vital Signs BP (!) 140/126 (BP Location: Left Arm)   Pulse 83   Temp 97.9 F (36.6 C) (Oral)   Resp 20   Ht 5\' 9"  (1.753 m)   Wt 165 lb (74.8 kg)   SpO2 100%   BMI 24.37 kg/m    Physical Exam  Constitutional: She is oriented to person, place, and time. She appears well-developed and well-nourished. No distress.  HENT:  Head: Normocephalic and atraumatic.  Right Ear:  Tympanic membrane, external ear and ear canal normal.  Left Ear: Tympanic membrane, external ear and ear canal normal.  Nose: Nose normal.  Mouth/Throat: Uvula is midline, oropharynx is clear and moist and mucous membranes are normal. No tonsillar exudate.  Eyes: Pupils are equal, round, and reactive to light. Conjunctivae and EOM are normal.  Cardiovascular: Normal rate and normal pulses.  Click auscultated   Pulmonary/Chest: Effort normal and breath sounds normal.  No increased work of breathing noted; congested productive cough noted in exam  Musculoskeletal:  No lower extremity edema noted   Neurological: She is alert and oriented to person, place, and time.  Skin: Skin is warm and dry.     UC Treatments / Results  Labs (all labs ordered are listed, but only abnormal results are displayed) Labs Reviewed - No data to display  EKG None  Radiology Dg Chest 2 View  Result Date: 03/24/2018 CLINICAL DATA:  Patient with cough and weakness EXAM: CHEST - 2 VIEW COMPARISON:  Chest radiograph 09/01/2015 FINDINGS: Multi lead pacer apparatus overlies the left hemithorax, leads are stable in position. Stable cardiomegaly. Aortic atherosclerosis. No consolidative pulmonary opacities. No pleural effusion or pneumothorax. Regional skeleton unremarkable. Upper abdominal surgical clips. IMPRESSION: No acute cardiopulmonary process. Electronically Signed   By: Annia Belt M.D.   On: 03/24/2018 11:20    Procedures Procedures (including critical care time)  Medications Ordered in UC Medications - No data to display  Initial Impression / Assessment and Plan / UC Course  I have reviewed the triage vital signs and the nursing notes.  Pertinent labs & imaging results that were available during my care of the patient were reviewed by me and considered in my medical decision making (see  chart for details).     Non toxic in appearance. Afebrile. No hypoxia or tachypnea. No increased work of  breathing. Lungs clear. Ambulatory without difficulty. No swelling, chest pain, shortness of breath  Or other red flag findings. Appears likely viral at this time. Encouraged close follow up with PCP in the next week. Return precautions provided. Patient verbalized understanding and agreeable to plan.  Ambulatory out of clinic without difficulty.   Final Clinical Impressions(s) / UC Diagnoses   Final diagnoses:  Cough  Upper respiratory tract infection, unspecified type     Discharge Instructions     Push fluids to ensure adequate hydration and keep secretions thin.  Tylenol and/or ibuprofen as needed for pain or fevers.  Tessalon capsules as needed for cough.  May use nasal spray to help with post nasal drip, especially before bed.  Please follow up with your primary care doctor for recheck of symptoms in the next week.  If develop chest pain , shortness of breath , weakness, fevers, or otherwise worsening please return or go to Er.     ED Prescriptions    Medication Sig Dispense Auth. Provider   benzonatate (TESSALON PERLES) 100 MG capsule Take 1 capsule (100 mg total) by mouth 3 (three) times daily as needed for cough. 20 capsule Linus Mako B, NP   ipratropium (ATROVENT) 0.06 % nasal spray Place 2 sprays into both nostrils 2 (two) times daily. 15 mL Linus Mako B, NP     Controlled Substance Prescriptions [place] Controlled Substance Registry consulted? Not Applicable   Georgetta Haber, NP 03/24/18 1136

## 2018-03-24 NOTE — ED Triage Notes (Signed)
Pt with 2 nights of non-productive cough. Denies nasal drainage or fevers.

## 2018-03-24 NOTE — Discharge Instructions (Signed)
Push fluids to ensure adequate hydration and keep secretions thin.  Tylenol and/or ibuprofen as needed for pain or fevers.  Tessalon capsules as needed for cough.  May use nasal spray to help with post nasal drip, especially before bed.  Please follow up with your primary care doctor for recheck of symptoms in the next week.  If develop chest pain , shortness of breath , weakness, fevers, or otherwise worsening please return or go to Er.

## 2018-04-02 ENCOUNTER — Ambulatory Visit: Payer: Medicare Other | Admitting: Podiatry

## 2018-04-09 ENCOUNTER — Ambulatory Visit (INDEPENDENT_AMBULATORY_CARE_PROVIDER_SITE_OTHER): Payer: Medicare Other | Admitting: Podiatry

## 2018-04-09 ENCOUNTER — Encounter: Payer: Self-pay | Admitting: Podiatry

## 2018-04-09 DIAGNOSIS — M79676 Pain in unspecified toe(s): Secondary | ICD-10-CM | POA: Diagnosis not present

## 2018-04-09 DIAGNOSIS — B351 Tinea unguium: Secondary | ICD-10-CM

## 2018-04-09 DIAGNOSIS — Q828 Other specified congenital malformations of skin: Secondary | ICD-10-CM

## 2018-04-09 NOTE — Progress Notes (Signed)
Complaint:  Visit Type: Patient returns to my office for continued preventative foot care services. Complaint: Patient states" my nails have grown long and thick and become painful to walk and wear shoes" Patient also has painful callus on both feet. The patient presents for preventative foot care services. No changes to ROS.  She says she also is concerned about the weakness in her left foot and possibly falling. She has been diagnosed with gait instability and movement disorder.  She has left sided weakness.  Podiatric Exam: Vascular: dorsalis pedis and posterior tibial pulses are palpable right . Dorsalis pedis and posterior tibial pulses are not palpable.. Capillary return is immediate. Temperature gradient is WNL. Skin turgor WNL  Sensorium: Normal Semmes Weinstein monofilament test. Normal tactile sensation bilaterally. Nail Exam: Pt has thick disfigured discolored nails with subungual debris noted bilateral entire nail hallux through fifth toenails Ulcer Exam: There is no evidence of ulcer or pre-ulcerative changes or infection. Orthopedic Exam: Muscle tone and strength are WNL. No limitations in general ROM. No crepitus or effusions noted. Foot type and digits show no abnormalities. Bony prominences are unremarkable. Plantarflexed fifth metatarsals  B/L Hammer toes 2-4  B/l.Significant muscle weakness left foot.  Skin:  Porokeratosis sub 5th met  B/l. No infection or ulcers.  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 The patient understood the discussion of treatment and procedures well. All questions were answered thoroughly reviewed. Debridement of mycotic and hypertrophic toenails, 1 through 5 bilateral and clearing of subungual debris. Debridement of porokeratosis.  RTC 3 months for nail care. Return Visit-Office Procedure: Patient  instructed to return to the office for a follow up visit 3 months for continued evaluation and treatment.    Gardiner Barefoot DPM

## 2018-04-10 ENCOUNTER — Ambulatory Visit (INDEPENDENT_AMBULATORY_CARE_PROVIDER_SITE_OTHER): Payer: Medicare Other | Admitting: Internal Medicine

## 2018-04-10 ENCOUNTER — Encounter: Payer: Self-pay | Admitting: Internal Medicine

## 2018-04-10 VITALS — BP 120/70 | HR 63 | Ht 69.0 in | Wt 168.0 lb

## 2018-04-10 DIAGNOSIS — I482 Chronic atrial fibrillation: Secondary | ICD-10-CM

## 2018-04-10 DIAGNOSIS — Z950 Presence of cardiac pacemaker: Secondary | ICD-10-CM | POA: Diagnosis not present

## 2018-04-10 DIAGNOSIS — I10 Essential (primary) hypertension: Secondary | ICD-10-CM | POA: Diagnosis not present

## 2018-04-10 DIAGNOSIS — I4821 Permanent atrial fibrillation: Secondary | ICD-10-CM

## 2018-04-10 DIAGNOSIS — I495 Sick sinus syndrome: Secondary | ICD-10-CM

## 2018-04-10 DIAGNOSIS — Z79899 Other long term (current) drug therapy: Secondary | ICD-10-CM | POA: Diagnosis not present

## 2018-04-10 LAB — CUP PACEART INCLINIC DEVICE CHECK
Battery Impedance: 6846 Ohm
Date Time Interrogation Session: 20190716145358
Implantable Pulse Generator Implant Date: 20090317
Lead Channel Impedance Value: 470 Ohm
Lead Channel Pacing Threshold Pulse Width: 0.4 ms
Lead Channel Sensing Intrinsic Amplitude: 8 mV
Lead Channel Setting Pacing Pulse Width: 0.4 ms
MDC IDC LEAD IMPLANT DT: 20090317
MDC IDC LEAD LOCATION: 753860
MDC IDC MSMT BATTERY REMAINING LONGEVITY: 2 mo
MDC IDC MSMT BATTERY VOLTAGE: 2.65 V
MDC IDC MSMT LEADCHNL RA IMPEDANCE VALUE: 0 Ohm
MDC IDC MSMT LEADCHNL RV PACING THRESHOLD AMPLITUDE: 0.5 V
MDC IDC SET LEADCHNL RV PACING AMPLITUDE: 2.5 V
MDC IDC SET LEADCHNL RV SENSING SENSITIVITY: 2.8 mV
MDC IDC STAT BRADY RV PERCENT PACED: 47 %

## 2018-04-10 NOTE — Progress Notes (Signed)
skf      Patient Care Team: Shapely-Quinn, Desiree Lucy, MD as PCP - General (Family Medicine) Antonieta Iba, MD as Consulting Physician (Cardiology) Duke Salvia, MD as Consulting Physician (Cardiology) Iran Ouch, MD as Consulting Physician (Cardiology)   HPI  Rhonda Griffith is a 72 y.o. female Former patient of Dr. Leonia Reeves who underwent pacemaker implantation for symptomatic bradycardia in the context of atrial fibrillation.  She also has a history of mitral  valve disease status post bioprosthetic mitral valve replacement.    DATE TEST EF   1/16    Echo  69 % Moderate LAE   4/18    Echo  60 % LVH/ Severe LAE 52/2.6/73-- nl MV function          Intercurrent hx notable for stroke and was changed from apixoban to Rivaroxaban at [facility]   Now on coumadin.  No significant interval bleeding.  She is semiambulatory.  She uses a walker.  She is at home.  She walks around.  Denies chest pain shortness of breath.  No edema.   Date Cr K Mg Hgb  12/16 1.27 3.4  11.9  6/18 1.0 3.3 1.6    12/18 1.2 4.3 2.1                Past Medical History:  Diagnosis Date  . Atrial fibrillation, permanent (HCC)    a. CHA2DS2VASc = 4-->coumadin;  b. 09/2014 Echo: EF 55-60%, normal wall motion, mild AI/AS, nl MV, mod dil LA, mildly dil RA, mild to mod TR, PASP .  [name] Chronic kidney disease, stage I   . CVA (cerebral infarction)    a. 04/2015 - anticoagulation switched from eliquis to xarelto to coumadin.  . Leukocytosis   . Pacemaker -Medtronic    a. implant for SSS  . PAD (peripheral artery disease) (HCC)    a. w/ left lower ext claudication s/p ABI's 04/2015 showing nl ABI on Right with abnl waveforms on left sugg of L SFA dzs.  [name] Restless leg syndrome   . 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    Past Surgical History:  Procedure Laterality Date  . bladder injection    . botox bladder    .  INSERT / REPLACE / REMOVE PACEMAKER     implant for SSS  . MELANOMA EXCISION     face  . MITRAL VALVE REPLACEMENT     s/p mechanical valve replaced with bovine valve 2010  . NASAL SINUS SURGERY      Current Outpatient Medications  Medication Sig Dispense Refill  . albuterol (PROVENTIL HFA;VENTOLIN HFA) 108 (90 BASE) MCG/ACT inhaler Inhale 2 puffs into the lungs every 6 (six) hours as needed for wheezing.    [name] ascorbic acid (VITAMIN C) 500 MG tablet Take 500 mg by mouth daily.    [name] atorvastatin (LIPITOR) 80 MG tablet Take 80 mg by mouth daily.    . benzonatate (TESSALON PERLES) 100 MG capsule Take 1 capsule (100 mg total) by mouth 3 (three) times daily as needed for cough. 20 capsule 0  . calcium-vitamin D (OSCAL-500) 500-400 MG-UNIT per tablet Take 1 tablet by mouth 2 (two) times daily.    . Cholecalciferol (VITAMIN D3) 1000 UNITS CAPS Take 2,000 Units by mouth every 30 (thirty) days.     [name] DIAZEPAM PO Take 5 mg by mouth as needed.     . diclofenac sodium (VOLTAREN)  1 % GEL Apply 1 application topically 2 (two) times daily.      [name] diltiazem (CARDIZEM CD) 120 MG 24 hr capsule Take 1 capsule (120 mg total) by mouth daily. 90 capsule 3  . Docusate Calcium (STOOL SOFTENER PO) Take 1 tablet by mouth 2 (two) times daily.    [name] docusate sodium (COLACE) 100 MG capsule Take by mouth.    . DULoxetine (CYMBALTA) 30 MG capsule Take 60 mg by mouth daily.     . fluticasone (FLONASE) 50 MCG/ACT nasal spray Place into both nostrils daily.    . furosemide (LASIX) 20 MG tablet Take 2 tablets (40 mg total) by mouth every other day. 90 tablet 3  . gabapentin (NEURONTIN) 100 MG capsule START WITH ONE CAP TWICE A DAY, MAY GRADUALLY INCREASE TO 3 CAPS THREE TIMES A DAY IF NEEDED.  5  . ipratropium (ATROVENT) 0.06 % nasal spray Place 2 sprays into both nostrils 2 (two) times daily. 15 mL 12  . magnesium oxide (MAG-OX) 400 MG tablet Take 400 mg by mouth 2 (two) times daily.     . mirabegron ER (MYRBETRIQ) 50 MG  TB24 tablet Take 50 mg by mouth daily.    . Multiple Vitamin (MULTIVITAMIN) tablet Take 1 tablet by mouth daily.    [name] oxyCODONE-acetaminophen (PERCOCET) 5-325 MG per tablet Take 1 tablet by mouth every 4 (four) hours as needed.      . pantoprazole (PROTONIX) 40 MG tablet Take by mouth.    . potassium chloride (K-DUR) 10 MEQ tablet Take 1 tablet (10 mEq total) by mouth daily as needed. Take with lasix 90 tablet 3  . rOPINIRole (REQUIP) 2 MG tablet Take 2 mg by mouth at bedtime.     . solifenacin (VESICARE) 10 MG tablet     . TOVIAZ 8 MG TB24 tablet Take 8 mg 2 (two) times daily by mouth.    . warfarin (COUMADIN) 2 MG tablet Take 2 tablets (4 mg total) by mouth daily. 120 tablet 6   No current facility-administered medications for this visit.     Allergies  Allergen Reactions  . Morphine Nausea And Vomiting  . Other Other (See Comments)    Several different statins.  . Statins Other (See Comments)    Several different statins.  . Tizanidine Palpitations  . Tape Other (See Comments)    Breaks her skin Allergic to plastic/latex tape. Only use paper tape on patient.  . Tapentadol Other (See Comments)    Allergic to plastic/latex tape. Only use paper tape on patient.  . Cephalexin Rash  . Latex Rash  . Sulfa Antibiotics Rash and Other (See Comments)    Allergic to plastic/latex tape. Only use paper tape on patient. Allergic to plastic/latex tape. Only use paper tape on patient.    Review of Systems negative except from HPI and PMH  Physical Exam BP 120/70 (BP Location: Right Arm, Patient Position: Sitting, Cuff Size: Normal)   Pulse 63   Ht 5\' 9"  (1.753 m)   Wt 168 lb (76.2 kg)   BMI 24.81 kg/m  Well developed and nourished in no acute distress HENT normal Neck supple with JVP-flat Clear Regular rate and rhythm, no murmurs or gallops Abd-soft with active BS No Clubbing cyanosis edema Skin-warm and dry A & Oriented  Grossly normal sensory and motor function    ECG atrial  fibrillation with underlying ventricular pacing at 63 bpm  Assessment and plan  Atrial fibrillation -permanent  Bradycardia  Pacemaker-Medtronic with lead  replacement  The patient's device was interrogated.  The information was reviewed. No changes were made in the programming.     History of mitral valve replacement, mechanical followed by bioprosthetic most recently 2010  Stroke  Hypertension    Anemia   Hypokalemia/hypomagnesiumemia   Blood pressure well controlled  Pacemaker approaching ERI.  This would be her third procedure.  Would anticipate the use of Aegis pouch  We will check hemoglobin today on Coumadin and recheck electrolytes with her history of hypokalemia  Euvolemic continue current meds  We spent more than 50% of our >25 min visit in face to face counseling regarding the above

## 2018-04-10 NOTE — Patient Instructions (Signed)
Medication Instructions: - Your physician recommends that you continue on your current medications as directed. Please refer to the Current Medication list given to you today.  Labwork: - Your physician recommends that you have lab work today: BMP/ CBC  Procedures/Testing: - none ordered  Follow-Up: - Remote monitoring is used to monitor your Pacemaker of ICD from home. This monitoring reduces the number of office visits required to check your device to one time per year. It allows us to keep an eye on the functioning of your device to ensure it is working properly. You are scheduled for a device check from home on 05/23/18. You may send your transmission at any time that day. If you have a wireless device, the transmission will be sent automatically. After your physician reviews your transmission, you will receive a postcard with your next transmission date.    Any Additional Special Instructions Will Be Listed Below (If Applicable).     If you need a refill on your cardiac medications before your next appointment, please call your pharmacy.

## 2018-04-11 LAB — CBC WITH DIFFERENTIAL/PLATELET
BASOS ABS: 0 10*3/uL (ref 0.0–0.2)
Basos: 1 %
EOS (ABSOLUTE): 0.2 10*3/uL (ref 0.0–0.4)
Eos: 2 %
HEMOGLOBIN: 12.8 g/dL (ref 11.1–15.9)
Hematocrit: 39.2 % (ref 34.0–46.6)
Immature Grans (Abs): 0 10*3/uL (ref 0.0–0.1)
Immature Granulocytes: 0 %
LYMPHS ABS: 1.5 10*3/uL (ref 0.7–3.1)
Lymphs: 18 %
MCH: 29.3 pg (ref 26.6–33.0)
MCHC: 32.7 g/dL (ref 31.5–35.7)
MCV: 90 fL (ref 79–97)
MONOCYTES: 7 %
MONOS ABS: 0.6 10*3/uL (ref 0.1–0.9)
NEUTROS ABS: 6.1 10*3/uL (ref 1.4–7.0)
Neutrophils: 72 %
Platelets: 338 10*3/uL (ref 150–450)
RBC: 4.37 x10E6/uL (ref 3.77–5.28)
RDW: 13.9 % (ref 12.3–15.4)
WBC: 8.3 10*3/uL (ref 3.4–10.8)

## 2018-04-11 LAB — BASIC METABOLIC PANEL
BUN / CREAT RATIO: 19 (ref 12–28)
BUN: 20 mg/dL (ref 8–27)
CO2: 23 mmol/L (ref 20–29)
CREATININE: 1.08 mg/dL — AB (ref 0.57–1.00)
Calcium: 9.9 mg/dL (ref 8.7–10.3)
Chloride: 103 mmol/L (ref 96–106)
GFR calc Af Amer: 58 mL/min/{1.73_m2} — ABNORMAL LOW (ref >59)
GFR calc non Af Amer: 51 mL/min/{1.73_m2} — ABNORMAL LOW (ref >59)
GLUCOSE: 80 mg/dL (ref 65–99)
Potassium: 4 mmol/L (ref 3.5–5.2)
Sodium: 141 mmol/L (ref 134–144)

## 2018-05-23 ENCOUNTER — Telehealth: Payer: Self-pay

## 2018-05-23 ENCOUNTER — Ambulatory Visit (INDEPENDENT_AMBULATORY_CARE_PROVIDER_SITE_OTHER): Payer: Medicare Other | Admitting: *Deleted

## 2018-05-23 DIAGNOSIS — I495 Sick sinus syndrome: Secondary | ICD-10-CM | POA: Diagnosis not present

## 2018-05-23 NOTE — Telephone Encounter (Signed)
Pt called with questions about when she will have her pacer removed.

## 2018-05-23 NOTE — Telephone Encounter (Signed)
LMOVM reminding pt to send remote transmission.   

## 2018-05-23 NOTE — Telephone Encounter (Signed)
Spoke with pt and informed her that it was likely her device would reach ERI at the end of September and that someone would call her and let her know when it had reached ERI and schedule an apt with Dr. Graciela HusbandsKlein. Informed pt that once her pacemaker reached ERI she had 3 months to get the device changed out pt voiced understanding.

## 2018-05-24 NOTE — Progress Notes (Signed)
Remote pacemaker transmission.   

## 2018-05-25 ENCOUNTER — Telehealth: Payer: Self-pay | Admitting: Internal Medicine

## 2018-05-25 NOTE — Telephone Encounter (Signed)
New Message:   Patient calling about her pace maker. Patient states that she is very tired and she is do to get it replace.

## 2018-05-25 NOTE — Telephone Encounter (Signed)
Called to request that patient send in a remote transmission from her ppm. Patient verbalized understanding.  Will call patient once transmission is received.

## 2018-05-30 ENCOUNTER — Emergency Department
Admission: EM | Admit: 2018-05-30 | Discharge: 2018-05-30 | Disposition: A | Discharge status: Home / Self Care | Payer: Medicare Other | Attending: Emergency Medicine | Admitting: Emergency Medicine

## 2018-05-30 ENCOUNTER — Emergency Department: Payer: Medicare Other

## 2018-05-30 ENCOUNTER — Encounter: Payer: Self-pay | Admitting: Emergency Medicine

## 2018-05-30 DIAGNOSIS — I5032 Chronic diastolic (congestive) heart failure: Secondary | ICD-10-CM | POA: Insufficient documentation

## 2018-05-30 DIAGNOSIS — N181 Chronic kidney disease, stage 1: Secondary | ICD-10-CM | POA: Insufficient documentation

## 2018-05-30 DIAGNOSIS — Y9389 Activity, other specified: Secondary | ICD-10-CM | POA: Insufficient documentation

## 2018-05-30 DIAGNOSIS — Z91040 Latex allergy status: Secondary | ICD-10-CM | POA: Diagnosis not present

## 2018-05-30 DIAGNOSIS — W19XXXA Unspecified fall, initial encounter: Secondary | ICD-10-CM | POA: Insufficient documentation

## 2018-05-30 DIAGNOSIS — I130 Hypertensive heart and chronic kidney disease with heart failure and stage 1 through stage 4 chronic kidney disease, or unspecified chronic kidney disease: Secondary | ICD-10-CM | POA: Diagnosis not present

## 2018-05-30 DIAGNOSIS — N3000 Acute cystitis without hematuria: Secondary | ICD-10-CM | POA: Insufficient documentation

## 2018-05-30 DIAGNOSIS — Y998 Other external cause status: Secondary | ICD-10-CM | POA: Insufficient documentation

## 2018-05-30 DIAGNOSIS — Y929 Unspecified place or not applicable: Secondary | ICD-10-CM | POA: Insufficient documentation

## 2018-05-30 DIAGNOSIS — R531 Weakness: Secondary | ICD-10-CM

## 2018-05-30 DIAGNOSIS — Z950 Presence of cardiac pacemaker: Secondary | ICD-10-CM | POA: Diagnosis not present

## 2018-05-30 DIAGNOSIS — Z87891 Personal history of nicotine dependence: Secondary | ICD-10-CM | POA: Diagnosis not present

## 2018-05-30 DIAGNOSIS — M25561 Pain in right knee: Secondary | ICD-10-CM

## 2018-05-30 DIAGNOSIS — M25562 Pain in left knee: Secondary | ICD-10-CM | POA: Insufficient documentation

## 2018-05-30 DIAGNOSIS — Z952 Presence of prosthetic heart valve: Secondary | ICD-10-CM | POA: Diagnosis not present

## 2018-05-30 LAB — CBC WITH DIFFERENTIAL/PLATELET
Basophils Absolute: 0.1 10*3/uL (ref 0–0.1)
Basophils Relative: 1 %
Eosinophils Absolute: 0.2 10*3/uL (ref 0–0.7)
Eosinophils Relative: 2 %
HCT: 39.4 % (ref 35.0–47.0)
Hemoglobin: 13.2 g/dL (ref 12.0–16.0)
Lymphocytes Relative: 11 %
Lymphs Abs: 1 10*3/uL (ref 1.0–3.6)
MCH: 30.7 pg (ref 26.0–34.0)
MCHC: 33.4 g/dL (ref 32.0–36.0)
MCV: 91.9 fL (ref 80.0–100.0)
Monocytes Absolute: 0.8 10*3/uL (ref 0.2–0.9)
Monocytes Relative: 8 %
Neutro Abs: 7.4 10*3/uL — ABNORMAL HIGH (ref 1.4–6.5)
Neutrophils Relative %: 78 %
Platelets: 251 10*3/uL (ref 150–440)
RBC: 4.29 MIL/uL (ref 3.80–5.20)
RDW: 13.8 % (ref 11.5–14.5)
WBC: 9.4 10*3/uL (ref 3.6–11.0)

## 2018-05-30 LAB — COMPREHENSIVE METABOLIC PANEL
ALBUMIN: 3.7 g/dL (ref 3.5–5.0)
ALK PHOS: 78 U/L (ref 38–126)
ALT: 14 U/L (ref 0–44)
AST: 18 U/L (ref 15–41)
Anion gap: 7 (ref 5–15)
BILIRUBIN TOTAL: 0.5 mg/dL (ref 0.3–1.2)
BUN: 27 mg/dL — AB (ref 8–23)
CALCIUM: 9.9 mg/dL (ref 8.9–10.3)
CO2: 29 mmol/L (ref 22–32)
CREATININE: 1.54 mg/dL — AB (ref 0.44–1.00)
Chloride: 108 mmol/L (ref 98–111)
GFR calc Af Amer: 37 mL/min — ABNORMAL LOW (ref >60)
GFR calc non Af Amer: 32 mL/min — ABNORMAL LOW (ref >60)
GLUCOSE: 121 mg/dL — AB (ref 70–99)
Potassium: 3.6 mmol/L (ref 3.5–5.1)
SODIUM: 144 mmol/L (ref 135–145)
TOTAL PROTEIN: 6.3 g/dL — AB (ref 6.5–8.1)

## 2018-05-30 LAB — URINALYSIS, COMPLETE (UACMP) WITH MICROSCOPIC
Bacteria, UA: NONE SEEN
Bilirubin Urine: NEGATIVE
Glucose, UA: NEGATIVE mg/dL
Hgb urine dipstick: NEGATIVE
Ketones, ur: NEGATIVE mg/dL
Nitrite: NEGATIVE
Protein, ur: NEGATIVE mg/dL
Specific Gravity, Urine: 1.02 (ref 1.005–1.030)
pH: 5 (ref 5.0–8.0)

## 2018-05-30 LAB — TROPONIN I: Troponin I: <0.03 ng/mL (ref <0.03)

## 2018-05-30 MED ORDER — ACETAMINOPHEN 325 MG PO TABS
650.0000 mg | ORAL_TABLET | Freq: Once | ORAL | Status: AC
  Administered 2018-05-30: 650 mg via ORAL

## 2018-05-30 MED ORDER — SODIUM CHLORIDE 0.9 % IV BOLUS
500.0000 mL | Freq: Once | INTRAVENOUS | Status: AC
  Administered 2018-05-30: 500 mL via INTRAVENOUS

## 2018-05-30 MED ORDER — CIPROFLOXACIN HCL 500 MG PO TABS: 500.0000 mg | ORAL_TABLET | Freq: Two times a day (BID) | ORAL | 0 refills | Status: AC

## 2018-05-30 MED ORDER — CIPROFLOXACIN IN D5W 400 MG/200ML IV SOLN
400.0000 mg | Freq: Once | INTRAVENOUS | Status: AC
  Administered 2018-05-30: 400 mg via INTRAVENOUS
  Filled 2018-05-30: qty 200

## 2018-05-30 MED ORDER — ACETAMINOPHEN 325 MG PO TABS
ORAL_TABLET | ORAL | Status: AC
  Filled 2018-05-30: qty 2

## 2018-05-30 NOTE — ED Notes (Signed)
Son, Casimiro Needle called at [phone] and left voicemail

## 2018-05-30 NOTE — ED Triage Notes (Signed)
Pt arrived via [place] [facility] with concerns over increased weakness over the last few days. Pt was with family and felt weak causing her to almost fall. Fall was prevented and pt was able to safely be seated on the floor. Pt denies any injury . Pt arrives with complaints of left knee pain.

## 2018-05-30 NOTE — ED Notes (Signed)
Hard copy of e-signature obtained due to no e-signature pad being available at this time.

## 2018-05-30 NOTE — ED Notes (Signed)
Daughter contacted at this time with no answer, will continue to try

## 2018-05-30 NOTE — Telephone Encounter (Signed)
Attempted to call patient bc remote transmission from 8/30 was not received. Spoke to daughter, Lupita Leash, who stated that patient was taken to the ER via EMS today d/t leg weakness. She says that she was aware of the remote that was sent in on 8/28, but states that she did not know that her mother was supposed to send a remote on 8/30. I told daughter that her mother's battery was estimating 1 month (range <1-9 months) on 8/28, and I told her that she could send one on 8/30 to make sure that nothing had changed d/t the onset of "weakness". Daughter verbalized understanding and states that she will discuss with her mother once she returns and have her to call.

## 2018-05-30 NOTE — ED Provider Notes (Signed)
[facility] Emergency Department Provider Note       Time seen: ----------------------------------------- 10:52 AM on 05/30/2018 -----------------------------------------   I have reviewed the triage vital signs and the nursing notes.  HISTORY   Chief Complaint Weakness    HPI Rhonda Griffith is a 72 y.o. female with a history of atrial for ablation, chronic kidney disease, CVA, peripheral vascular disease who presents to the ED for weakness over the past few days.  Family states she has been weak this caused her to almost fall.  The fall was prevented and she was able to safely be seated on the floor but could not be ambulated afterwards.  She is complaining of bilateral knee pain, left greater than right.  She denies any recent illness or other complaints.  Past Medical History:  Diagnosis Date  . Atrial fibrillation, permanent (HCC)    a. CHA2DS2VASc = 4-->coumadin;  b. 09/2014 Echo: EF 55-60%, normal wall motion, mild AI/AS, nl MV, mod dil LA, mildly dil RA, mild to mod TR, PASP .  [name] Chronic kidney disease, stage I   . CVA (cerebral infarction)    a. 04/2015 - anticoagulation switched from eliquis to xarelto to coumadin.  . Leukocytosis   . Pacemaker -Medtronic    a. implant for SSS  . PAD (peripheral artery disease) (HCC)    a. w/ left lower ext claudication s/p ABI's 04/2015 showing nl ABI on Right with abnl waveforms on left sugg of L SFA dzs.  [name] Restless leg syndrome   . 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    Patient Active Problem List   Diagnosis Date Noted  . ERRONEOUS ENCOUNTER--DISREGARD 07/03/2016  . Carotid bruit 12/22/2015  . Hyperlipidemia 10/23/2015  . Atrial fibrillation, permanent (HCC)   . Pacemaker -Medtronic   . 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 Chronic kidney disease, unspecified   . Encounter for anticoagulation  discussion and counseling 06/25/2015  . Acute cardioembolic stroke (HCC) 06/25/2015  . PAD (peripheral artery disease) (HCC) 06/25/2015  . Pure hypercholesterolemia 04/30/2015  . History of recent stroke 04/28/2015  . Essential hypertension 12/09/2014  . Gait instability 09/08/2014  . Status post total bilateral knee replacement 06/24/2014  . UTI (urinary tract infection) 02/03/2014  . Fatigue 10/01/2013  . Shortness of breath 09/12/2012  . Stress disorder, acute 09/12/2012  . Tachycardia 09/12/2012  . Atrial fibrillation (HCC) 09/12/2012  . Melanoma in situ (HCC) 07/11/2012  . Morphea 07/11/2012  . Long term (current) use of anticoagulants 07/02/2012  . History of malignant melanoma of skin 05/30/2012  . CMC arthritis 02/02/2012  . Sprain of MCL (medial collateral ligament) of knee 01/24/2012  . Squamous cell carcinoma 12/21/2011  . Carpal tunnel syndrome of left wrist 11/28/2011  . Sinoatrial node dysfunction (HCC)   . Movement disorder 08/08/2011  . Edema 06/07/2011  . Chronic diastolic heart failure (HCC) 05/27/2011  . Anxiety state 05/14/2011  . Esophageal reflux 05/14/2011  . Mitral valve replaced 05/14/2011  . Myalgia and myositis, unspecified 05/14/2011  . OSA (obstructive sleep apnea) 05/14/2011  . Osteoarthritis 05/14/2011  . Pulmonary hypertension (HCC) 05/14/2011  . Cardiac pacemaker in situ 10/03/2010    Past Surgical History:  Procedure Laterality Date  . bladder injection    . botox bladder    . INSERT / REPLACE / REMOVE PACEMAKER     implant for SSS  . MELANOMA EXCISION  face  . MITRAL VALVE REPLACEMENT     s/p mechanical valve replaced with bovine valve 2010  . NASAL SINUS SURGERY      Allergies Morphine; Other; Statins; Tizanidine; Tape; Tapentadol; Cephalexin; Latex; and Sulfa antibiotics  Social History Social History   Tobacco Use  . Smoking status: Former Smoker    Packs/day: 1.00    Years: 5.00    Pack years: 5.00    Types:  Cigarettes    Last attempt to quit: 12/29/1970    Years since quitting: 47.4  . Smokeless tobacco: Never Used  Substance Use Topics  . Alcohol use: No  . Drug use: No   Review of Systems Constitutional: Negative for fever. Cardiovascular: Negative for chest pain. Respiratory: Negative for shortness of breath. Gastrointestinal: Negative for abdominal pain, vomiting and diarrhea. Musculoskeletal: Positive for bilateral knee pain Skin: Negative for rash. Neurological: Positive for generalized weakness  All systems negative/normal/unremarkable except as stated in the HPI  ____________________________________________   PHYSICAL EXAM:  VITAL SIGNS: ED Triage Vitals  Enc Vitals Group     BP 05/30/18 1016 134/65     Pulse Rate 05/30/18 1016 67     Resp 05/30/18 1016 20     Temp 05/30/18 1016 98.2 F (36.8 C)     Temp Source 05/30/18 1016 Axillary     SpO2 05/30/18 1016 96 %     Weight 05/30/18 1017 163 lb (73.9 kg)     Height 05/30/18 1017 5\' 9"  (1.753 m)     Head Circumference --      Peak Flow --      Pain Score 05/30/18 1017 7     Pain Loc --      Pain Edu? --      Excl. in GC? --    Constitutional: Alert and oriented. Well appearing and in no distress. Eyes: Conjunctivae are normal. Normal extraocular movements. ENT   Head: Normocephalic and atraumatic.   Nose: No congestion/rhinnorhea.   Mouth/Throat: Mucous membranes are moist.   Neck: No stridor. Cardiovascular: Normal rate, regular rhythm. No murmurs, rubs, or gallops. Respiratory: Normal respiratory effort without tachypnea nor retractions. Breath sounds are clear and equal bilaterally. No wheezes/rales/rhonchi. Gastrointestinal: Soft and nontender. Normal bowel sounds Musculoskeletal: Pain with range of motion of both knees, left greater than right, left knee effusion is noted. Neurologic:  Normal speech and language.  Generalized weakness, nothing focal Skin:  Skin is warm, dry and intact. No rash  noted. Psychiatric: Mood and affect are normal. Speech and behavior are normal.  ____________________________________________  EKG: Interpreted by me.  Ventricular paced rhythm with a rate of 70 bpm, normal pacemaker function is noted  ____________________________________________  ED COURSE:  As part of my medical decision making, I reviewed the following data within the electronic MEDICAL RECORD NUMBER History obtained from family if available, nursing notes, old chart and ekg, as well as notes from prior ED visits. Patient presented for weakness with a fall, we will assess with labs and imaging as indicated at this time.   Procedures ____________________________________________   LABS (pertinent positives/negatives)  Labs Reviewed  CBC WITH DIFFERENTIAL/PLATELET - Abnormal; Notable for the following components:      Result Value   Neutro Abs 7.4 (*)    All other components within normal limits  COMPREHENSIVE METABOLIC PANEL - Abnormal; Notable for the following components:   Glucose, Bld 121 (*)    BUN 27 (*)    Creatinine, Ser 1.54 (*)    Total  Protein 6.3 (*)    GFR calc non Af Amer 32 (*)    GFR calc Af Amer 37 (*)    All other components within normal limits  URINALYSIS, COMPLETE (UACMP) WITH MICROSCOPIC - Abnormal; Notable for the following components:   Color, Urine AMBER (*)    APPearance HAZY (*)    Leukocytes, UA SMALL (*)    All other components within normal limits  TROPONIN I    RADIOLOGY Images were viewed by me  Bilateral knee x-rays IMPRESSION: Status post right knee replacement.  No acute finding. IMPRESSION: No acute abnormality.  Status post left knee replacement. ____________________________________________  DIFFERENTIAL DIAGNOSIS   Fall, contusion, fracture, dehydration, electrolyte abnormality, occult infection  FINAL ASSESSMENT AND PLAN  Fall, knee contusion, weakness, UTI, mild dehydration   Plan: The patient had presented for a fall due  to weakness. Patient's labs did likely reflect mild dehydration and for this she was given IV fluids. Patient's imaging did not reveal any acute process.  Her urine also appeared infected and she was given IV Cipro for this.  She states she has help at home and is cleared for outpatient follow-up.   Ulice Dash, MD   Note: This note was generated in part or whole with voice recognition software. Voice recognition is usually quite accurate but there are transcription errors that can and very often do occur. I apologize for any typographical errors that were not detected and corrected.     Emily Filbert, MD 05/30/18 1355

## 2018-05-30 NOTE — ED Notes (Signed)
Urine spec collected by [place] RN and [place], NT

## 2018-05-30 NOTE — ED Notes (Signed)
Daughter called at this time and is finding ride for pt

## 2018-05-31 ENCOUNTER — Emergency Department
Admission: EM | Admit: 2018-05-31 | Discharge: 2018-06-01 | Disposition: A | Discharge status: Skilled Nursing Facility | Payer: Medicare Other | Attending: Student in an Organized Health Care Education/Training Program | Admitting: Student in an Organized Health Care Education/Training Program

## 2018-05-31 ENCOUNTER — Emergency Department: Payer: Medicare Other

## 2018-05-31 ENCOUNTER — Encounter: Payer: Self-pay | Admitting: Emergency Medicine

## 2018-05-31 ENCOUNTER — Other Ambulatory Visit: Payer: Self-pay

## 2018-05-31 DIAGNOSIS — Z7901 Long term (current) use of anticoagulants: Secondary | ICD-10-CM | POA: Insufficient documentation

## 2018-05-31 DIAGNOSIS — R531 Weakness: Secondary | ICD-10-CM | POA: Diagnosis present

## 2018-05-31 DIAGNOSIS — N181 Chronic kidney disease, stage 1: Secondary | ICD-10-CM | POA: Diagnosis not present

## 2018-05-31 DIAGNOSIS — Z950 Presence of cardiac pacemaker: Secondary | ICD-10-CM | POA: Insufficient documentation

## 2018-05-31 DIAGNOSIS — I5032 Chronic diastolic (congestive) heart failure: Secondary | ICD-10-CM | POA: Insufficient documentation

## 2018-05-31 DIAGNOSIS — I130 Hypertensive heart and chronic kidney disease with heart failure and stage 1 through stage 4 chronic kidney disease, or unspecified chronic kidney disease: Secondary | ICD-10-CM | POA: Diagnosis not present

## 2018-05-31 DIAGNOSIS — R262 Difficulty in walking, not elsewhere classified: Secondary | ICD-10-CM | POA: Diagnosis not present

## 2018-05-31 DIAGNOSIS — M6281 Muscle weakness (generalized): Secondary | ICD-10-CM | POA: Diagnosis not present

## 2018-05-31 DIAGNOSIS — N390 Urinary tract infection, site not specified: Secondary | ICD-10-CM | POA: Diagnosis not present

## 2018-05-31 DIAGNOSIS — Z87891 Personal history of nicotine dependence: Secondary | ICD-10-CM | POA: Insufficient documentation

## 2018-05-31 DIAGNOSIS — Z91040 Latex allergy status: Secondary | ICD-10-CM | POA: Diagnosis not present

## 2018-05-31 DIAGNOSIS — Z79899 Other long term (current) drug therapy: Secondary | ICD-10-CM | POA: Insufficient documentation

## 2018-05-31 LAB — COMPREHENSIVE METABOLIC PANEL
ALBUMIN: 3.4 g/dL — AB (ref 3.5–5.0)
ALK PHOS: 66 U/L (ref 38–126)
ALT: 14 U/L (ref 0–44)
AST: 17 U/L (ref 15–41)
Anion gap: 4 — ABNORMAL LOW (ref 5–15)
BILIRUBIN TOTAL: 0.7 mg/dL (ref 0.3–1.2)
BUN: 20 mg/dL (ref 8–23)
CO2: 29 mmol/L (ref 22–32)
Calcium: 9.4 mg/dL (ref 8.9–10.3)
Chloride: 107 mmol/L (ref 98–111)
Creatinine, Ser: 1.22 mg/dL — ABNORMAL HIGH (ref 0.44–1.00)
GFR calc Af Amer: 49 mL/min — ABNORMAL LOW (ref >60)
GFR calc non Af Amer: 43 mL/min — ABNORMAL LOW (ref >60)
GLUCOSE: 113 mg/dL — AB (ref 70–99)
Potassium: 3.5 mmol/L (ref 3.5–5.1)
SODIUM: 140 mmol/L (ref 135–145)
TOTAL PROTEIN: 5.8 g/dL — AB (ref 6.5–8.1)

## 2018-05-31 LAB — CBC WITH DIFFERENTIAL/PLATELET
Basophils Absolute: 0 10*3/uL (ref 0–0.1)
Basophils Relative: 0 %
EOS PCT: 2 %
Eosinophils Absolute: 0.2 10*3/uL (ref 0–0.7)
HEMATOCRIT: 38.4 % (ref 35.0–47.0)
Hemoglobin: 12.7 g/dL (ref 12.0–16.0)
LYMPHS ABS: 1 10*3/uL (ref 1.0–3.6)
Lymphocytes Relative: 14 %
MCH: 30.5 pg (ref 26.0–34.0)
MCHC: 33.1 g/dL (ref 32.0–36.0)
MCV: 91.9 fL (ref 80.0–100.0)
MONO ABS: 0.7 10*3/uL (ref 0.2–0.9)
Monocytes Relative: 9 %
NEUTROS ABS: 5.8 10*3/uL (ref 1.4–6.5)
Neutrophils Relative %: 75 %
Platelets: 225 10*3/uL (ref 150–440)
RBC: 4.18 MIL/uL (ref 3.80–5.20)
RDW: 13.7 % (ref 11.5–14.5)
WBC: 7.7 10*3/uL (ref 3.6–11.0)

## 2018-05-31 LAB — URINALYSIS, COMPLETE (UACMP) WITH MICROSCOPIC
Bilirubin Urine: NEGATIVE
GLUCOSE, UA: NEGATIVE mg/dL
HGB URINE DIPSTICK: NEGATIVE
Ketones, ur: NEGATIVE mg/dL
Leukocytes, UA: NEGATIVE
NITRITE: NEGATIVE
Protein, ur: NEGATIVE mg/dL
SPECIFIC GRAVITY, URINE: 1.023 (ref 1.005–1.030)
Squamous Epithelial / LPF: NONE SEEN (ref 0–5)
pH: 5 (ref 5.0–8.0)

## 2018-05-31 MED ORDER — DIAZEPAM 5 MG PO TABS: 5.0000 mg | ORAL_TABLET | ORAL | Status: DC | PRN

## 2018-05-31 MED ORDER — CIPROFLOXACIN HCL 500 MG PO TABS
500.0000 mg | ORAL_TABLET | Freq: Two times a day (BID) | ORAL | Status: DC
  Administered 2018-06-01 (×2): 500 mg via ORAL
  Filled 2018-05-31 (×2): qty 1

## 2018-05-31 MED ORDER — DULOXETINE HCL 60 MG PO CPEP
60.0000 mg | ORAL_CAPSULE | Freq: Every day | ORAL | Status: DC
  Administered 2018-05-31 – 2018-06-01 (×2): 60 mg via ORAL
  Filled 2018-05-31 (×2): qty 1

## 2018-05-31 MED ORDER — IPRATROPIUM BROMIDE 0.06 % NA SOLN
2.0000 | Freq: Two times a day (BID) | NASAL | Status: DC
  Administered 2018-06-01 (×2): 2 via NASAL
  Filled 2018-05-31: qty 15

## 2018-05-31 MED ORDER — ROPINIROLE HCL 1 MG PO TABS
2.0000 mg | ORAL_TABLET | Freq: Every day | ORAL | Status: DC
  Administered 2018-06-01: 2 mg via ORAL
  Filled 2018-05-31 (×2): qty 2

## 2018-05-31 MED ORDER — DILTIAZEM HCL ER COATED BEADS 120 MG PO CP24
120.0000 mg | ORAL_CAPSULE | Freq: Every day | ORAL | Status: DC
  Administered 2018-05-31 – 2018-06-01 (×2): 120 mg via ORAL
  Filled 2018-05-31 (×2): qty 1

## 2018-05-31 MED ORDER — WARFARIN SODIUM 2.5 MG PO TABS
2.5000 mg | ORAL_TABLET | ORAL | Status: DC
  Administered 2018-06-01: 2.5 mg via ORAL
  Filled 2018-05-31 (×2): qty 1

## 2018-05-31 MED ORDER — FOSFOMYCIN TROMETHAMINE 3 G PO PACK
3.0000 g | PACK | Freq: Once | ORAL | Status: AC
  Administered 2018-05-31: 3 g via ORAL
  Filled 2018-05-31: qty 3

## 2018-05-31 MED ORDER — PANTOPRAZOLE SODIUM 40 MG PO TBEC
40.0000 mg | DELAYED_RELEASE_TABLET | Freq: Every day | ORAL | Status: DC
  Administered 2018-05-31 – 2018-06-01 (×2): 40 mg via ORAL
  Filled 2018-05-31 (×2): qty 1

## 2018-05-31 MED ORDER — FUROSEMIDE 40 MG PO TABS: 20.0000 mg | ORAL_TABLET | Freq: Every day | ORAL | Status: DC | PRN

## 2018-05-31 MED ORDER — GABAPENTIN 100 MG PO CAPS: 100.0000 mg | ORAL_CAPSULE | ORAL | Status: DC

## 2018-05-31 NOTE — ED Notes (Signed)
Family at bedside. 

## 2018-05-31 NOTE — ED Notes (Signed)
Patient transported to CT 

## 2018-05-31 NOTE — NC FL2 (Addendum)
[place] MEDICAID FL2 LEVEL OF CARE SCREENING TOOL     IDENTIFICATION  Patient Name: Rhonda Griffith Birthdate: 30-Jul-1946 Sex: female Admission Date (Current Location): 05/31/2018  [place] and [place] Number:  Chiropodist and Address:  [facility], [address], [place], [place] [address]      Provider Number: [id number]  Attending Physician Name and Address:  No att. providers found  Relative Name and Phone Number:  Daughter Zachery Dakins [phone](m) [phone](h)    Current Level of Care: Hospital Recommended Level of Care: Skilled Nursing Facility Prior Approval Number:    Date Approved/Denied:   PASRR Number: [id number]  Discharge Plan: SNF    Current Diagnoses: Patient Active Problem List   Diagnosis Date Noted  . ERRONEOUS ENCOUNTER--DISREGARD 07/03/2016  . Carotid bruit 12/22/2015  . Hyperlipidemia 10/23/2015  . Atrial fibrillation, permanent (HCC)   . Pacemaker -Medtronic   . 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 Chronic kidney disease, unspecified   . Encounter for anticoagulation discussion and counseling 06/25/2015  . Acute cardioembolic stroke (HCC) 06/25/2015  . PAD (peripheral artery disease) (HCC) 06/25/2015  . Pure hypercholesterolemia 04/30/2015  . History of recent stroke 04/28/2015  . Essential hypertension 12/09/2014  . Gait instability 09/08/2014  . Status post total bilateral knee replacement 06/24/2014  . UTI (urinary tract infection) 02/03/2014  . Fatigue 10/01/2013  . Shortness of breath 09/12/2012  . Stress disorder, acute 09/12/2012  . Tachycardia 09/12/2012  . Atrial fibrillation (HCC) 09/12/2012  . Melanoma in situ (HCC) 07/11/2012  . Morphea 07/11/2012  . Long term (current) use of anticoagulants 07/02/2012  . History of malignant melanoma of skin 05/30/2012  . CMC arthritis 02/02/2012  . Sprain of MCL (medial collateral ligament) of knee 01/24/2012  .  Squamous cell carcinoma 12/21/2011  . Carpal tunnel syndrome of left wrist 11/28/2011  . Sinoatrial node dysfunction (HCC)   . Movement disorder 08/08/2011  . Edema 06/07/2011  . Chronic diastolic heart failure (HCC) 05/27/2011  . Anxiety state 05/14/2011  . Esophageal reflux 05/14/2011  . Mitral valve replaced 05/14/2011  . Myalgia and myositis, unspecified 05/14/2011  . OSA (obstructive sleep apnea) 05/14/2011  . Osteoarthritis 05/14/2011  . Pulmonary hypertension (HCC) 05/14/2011  . Cardiac pacemaker in situ 10/03/2010    Orientation RESPIRATION BLADDER Height & Weight     Self, Time, Situation, Place  Normal Incontinent Weight:   Height:     BEHAVIORAL SYMPTOMS/MOOD NEUROLOGICAL BOWEL NUTRITION STATUS      Continent Diet(Normal Diet)  AMBULATORY STATUS COMMUNICATION OF NEEDS Skin   Extensive Assist Verbally Normal                       Personal Care Assistance Level of Assistance  Bathing, Feeding, Dressing Bathing Assistance: Limited assistance Feeding assistance: Independent Dressing Assistance: Limited assistance     Functional Limitations Info  Sight, Hearing, Speech Sight Info: Adequate(Corrective lenses) Hearing Info: Adequate Speech Info: Adequate    SPECIAL CARE FACTORS FREQUENCY  PT (By licensed PT)     PT Frequency: 5x              Contractures Contractures Info: Not present    Additional Factors Info  Code Status, Allergies Code Status Info: Full Allergies Info: Morphine, Other, Statins, Tizanidine, Tape, Tapentadol, Cephalexin, Latex, Sulfa Antibiotics           Current Medications (05/31/2018):  This is the current hospital active medication list See printed list  Discharge Medications: Please see discharge summary for a list of discharge medications.  Relevant Imaging Results:  Relevant Lab Results:   Additional Information SSN: [id number]  Dominic Pea, LCSW

## 2018-05-31 NOTE — Clinical Social Work Note (Signed)
CSW received consult for "CSW / ?rehab placement." CSW staffed with EDP Dr. Quentin Cornwall. Patient still receiving medical work-up. CSW spoke with [place] in physical therapy, who confirmed they will evaluate patient on Friday morning. CSW met with patient at hallway bedside. Patient lives with daughter and on-in-law and cannot ambulate. Patient states she has ambulated for years with a walker, but cannot do that as of the last few days. Patient wanting placement in short-term rehab. CSW explained that CSW will send out referral for [place] (SNF) rehab placement, but insurance we will have to await insurance approval for final transitions. CSW asked if patient has the ability to pay for rehab out of pocket and patient stated no. Patient expressed understanding. Patient's facility preference is [facility], as she has gone there before for rehab. Patient also agreeable to fax out to all [facility] facilities. CSW updated patient's daughter, after she arrived at hallway bedside.   CSW updated EDP Dr. Quentin Cornwall and [place] Rota [place] and [place]. CSW completed FL-2 and sent out referral. CSW continuing to follow for discharge needs.   Oretha Ellis, Latanya Presser, [place] Worker-Emergency Department [phone]

## 2018-05-31 NOTE — ED Notes (Signed)
Report off to [place] rn.  

## 2018-05-31 NOTE — ED Notes (Signed)
Patient transported to X-ray 

## 2018-05-31 NOTE — ED Triage Notes (Signed)
Pt to ED via EMS from home, was seen yesterday for weakness and leg pain, sent home. PT states unable to ambulate and needs [facility] consult for rehab. VSS, pt has not had PO antibiotics that were px yesterday.  NAD noted

## 2018-05-31 NOTE — ED Notes (Signed)
Pt alert.  Drinking water .

## 2018-05-31 NOTE — ED Provider Notes (Signed)
[facility] Emergency Department Provider Note  ____________________________________________  Time seen: Approximately 4:18 PM  I have reviewed the triage vital signs and the nursing notes.   HISTORY  Chief Complaint Weakness    HPI Rhonda Griffith is a 72 y.o. female with a history of atrial fibrillation who complains of generalized weakness for the past few days.  She was seen in the ED yesterday and diagnosed with a urinary tract infection.  However, today she is more weak and unable to get out of bed and unable to walk at home.  Significant functional limitation with her acute illness.  Has not taken antibiotics yet today.   No falls or trauma     Past Medical History:  Diagnosis Date  . Atrial fibrillation, permanent (HCC)    a. CHA2DS2VASc = 4-->coumadin;  b. 09/2014 Echo: EF 55-60%, normal wall motion, mild AI/AS, nl MV, mod dil LA, mildly dil RA, mild to mod TR, PASP .  [name] Chronic kidney disease, stage I   . CVA (cerebral infarction)    a. 04/2015 - anticoagulation switched from eliquis to xarelto to coumadin.  . Leukocytosis   . Pacemaker -Medtronic    a. implant for SSS  . PAD (peripheral artery disease) (HCC)    a. w/ left lower ext claudication s/p ABI's 04/2015 showing nl ABI on Right with abnl waveforms on left sugg of L SFA dzs.  [name] Restless leg syndrome   . 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     Patient Active Problem List   Diagnosis Date Noted  . ERRONEOUS ENCOUNTER--DISREGARD 07/03/2016  . Carotid bruit 12/22/2015  . Hyperlipidemia 10/23/2015  . Atrial fibrillation, permanent (HCC)   . Pacemaker -Medtronic   . 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 Chronic kidney disease, unspecified   . Encounter for anticoagulation discussion and counseling 06/25/2015  . Acute cardioembolic stroke (HCC) 06/25/2015  . PAD (peripheral artery  disease) (HCC) 06/25/2015  . Pure hypercholesterolemia 04/30/2015  . History of recent stroke 04/28/2015  . Essential hypertension 12/09/2014  . Gait instability 09/08/2014  . Status post total bilateral knee replacement 06/24/2014  . UTI (urinary tract infection) 02/03/2014  . Fatigue 10/01/2013  . Shortness of breath 09/12/2012  . Stress disorder, acute 09/12/2012  . Tachycardia 09/12/2012  . Atrial fibrillation (HCC) 09/12/2012  . Melanoma in situ (HCC) 07/11/2012  . Morphea 07/11/2012  . Long term (current) use of anticoagulants 07/02/2012  . History of malignant melanoma of skin 05/30/2012  . CMC arthritis 02/02/2012  . Sprain of MCL (medial collateral ligament) of knee 01/24/2012  . Squamous cell carcinoma 12/21/2011  . Carpal tunnel syndrome of left wrist 11/28/2011  . Sinoatrial node dysfunction (HCC)   . Movement disorder 08/08/2011  . Edema 06/07/2011  . Chronic diastolic heart failure (HCC) 05/27/2011  . Anxiety state 05/14/2011  . Esophageal reflux 05/14/2011  . Mitral valve replaced 05/14/2011  . Myalgia and myositis, unspecified 05/14/2011  . OSA (obstructive sleep apnea) 05/14/2011  . Osteoarthritis 05/14/2011  . Pulmonary hypertension (HCC) 05/14/2011  . Cardiac pacemaker in situ 10/03/2010     Past Surgical History:  Procedure Laterality Date  . bladder injection    . botox bladder    . INSERT / REPLACE / REMOVE PACEMAKER     implant for SSS  . MELANOMA EXCISION     face  . MITRAL VALVE REPLACEMENT  s/p mechanical valve replaced with bovine valve 2010  . NASAL SINUS SURGERY       Prior to Admission medications   Medication Sig Start Date End Date Taking? Authorizing Provider  albuterol (PROVENTIL HFA;VENTOLIN HFA) 108 (90 BASE) MCG/ACT inhaler Inhale 2 puffs into the lungs every 6 (six) hours as needed for wheezing.   Yes [provider]  ascorbic acid (VITAMIN C) 500 MG tablet Take 500 mg by mouth daily.   Yes [provider]   atorvastatin (LIPITOR) 80 MG tablet Take 80 mg by mouth daily.   Yes [provider]  benzonatate (TESSALON PERLES) 100 MG capsule Take 1 capsule (100 mg total) by mouth 3 (three) times daily as needed for cough. 03/24/18  Yes Burky, Dorene Grebe B, NP  calcium-vitamin D (OSCAL-500) 500-400 MG-UNIT per tablet Take 1 tablet by mouth 2 (two) times daily.   Yes [provider]  Cholecalciferol (VITAMIN D3) 1000 UNITS CAPS Take 2,000 Units by mouth every 30 (thirty) days.    Yes [provider]  diazepam (VALIUM) 5 MG tablet Take 5 mg by mouth as needed (anxiety).    Yes [provider]  diclofenac sodium (VOLTAREN) 1 % GEL Apply 1 application topically 2 (two) times daily.     Yes [provider]  diltiazem (CARDIZEM CD) 120 MG 24 hr capsule Take 1 capsule (120 mg total) by mouth daily. 09/06/16  Yes Antonieta Iba, MD  docusate sodium (COLACE) 100 MG capsule Take 100-200 mg by mouth daily as needed for mild constipation or moderate constipation.    Yes [provider]  DULoxetine (CYMBALTA) 60 MG capsule Take 60 mg by mouth daily.    Yes [provider]  fluticasone (FLONASE) 50 MCG/ACT nasal spray Place 1-2 sprays into both nostrils daily.    Yes [provider]  furosemide (LASIX) 20 MG tablet Take 2 tablets (40 mg total) by mouth every other day. Patient taking differently: Take 20 mg by mouth daily as needed for edema.  09/06/16  Yes Duke Salvia, MD  gabapentin (NEURONTIN) 100 MG capsule See admin instructions. Take 1 capsule (100MG ) by mouth every morning and 2 capsules (200MG ) by mouth every night 03/17/18  Yes [provider]  ipratropium (ATROVENT) 0.06 % nasal spray Place 2 sprays into both nostrils 2 (two) times daily. 03/24/18  Yes Burky, Dorene Grebe B, NP  magnesium oxide (MAG-OX) 400 MG tablet Take 400 mg by mouth 2 (two) times daily.    Yes [provider]  mirabegron ER (MYRBETRIQ) 50 MG TB24 tablet  Take 50 mg by mouth daily. 09/02/16  Yes [provider]  Multiple Vitamin (MULTIVITAMIN) tablet Take 1 tablet by mouth daily.   Yes [provider]  oxyCODONE-acetaminophen (PERCOCET) 5-325 MG per tablet Take 1 tablet by mouth every 4 (four) hours as needed for moderate pain.    Yes [provider]  pantoprazole (PROTONIX) 40 MG tablet Take 40 mg by mouth daily.    Yes [provider]  potassium chloride (K-DUR) 10 MEQ tablet Take 1 tablet (10 mEq total) by mouth daily as needed. Take with lasix 09/06/16  Yes Gollan, Tollie Pizza, MD  rOPINIRole (REQUIP) 2 MG tablet Take 2 mg by mouth at bedtime.  05/29/15  Yes [provider]  solifenacin (VESICARE) 10 MG tablet Take 10 mg by mouth daily.  04/03/18  Yes [provider]  warfarin (COUMADIN) 2 MG tablet See admin instructions. Take 1 tablet (2MG ) by mouth every  Monday, Wednesday and Friday evening and 2 tablets (4MG ) every Tuesday, Thursday, Saturday and Sunday evening   Yes [provider]  ciprofloxacin (CIPRO) 500 MG tablet Take 1 tablet (500 mg total) by mouth 2 (two) times daily for 7 days. 05/30/18 06/06/18  Emily Filbert, MD     Allergies Morphine; Other; Statins; Tizanidine; Tape; Tapentadol; Cephalexin; Latex; and Sulfa antibiotics   Family History  Family history unknown: Yes    Social History Social History   Tobacco Use  . Smoking status: Former Smoker    Packs/day: 1.00    Years: 5.00    Pack years: 5.00    Types: Cigarettes    Last attempt to quit: 12/29/1970    Years since quitting: 47.4  . Smokeless tobacco: Never Used  Substance Use Topics  . Alcohol use: No  . Drug use: No    Review of Systems  Constitutional:   No fever or chills.  ENT:   No sore throat. No rhinorrhea. Cardiovascular:   No chest pain or syncope. Respiratory:   No dyspnea or cough. Gastrointestinal:   Negative for abdominal pain, vomiting and diarrhea.  Musculoskeletal:   Negative  for focal pain or swelling All other systems reviewed and are negative except as documented above in ROS and HPI.  ____________________________________________   PHYSICAL EXAM:  VITAL SIGNS: ED Triage Vitals  Enc Vitals Group     BP 05/31/18 1336 130/82     Pulse Rate 05/31/18 1336 66     Resp 05/31/18 1336 16     Temp 05/31/18 1340 97.9 F (36.6 C)     Temp Source 05/31/18 1336 Oral     SpO2 05/31/18 1336 98 %     Weight --      Height --      Head Circumference --      Peak Flow --      Pain Score 05/31/18 1338 9     Pain Loc --      Pain Edu? --      Excl. in GC? --     Vital signs reviewed, nursing assessments reviewed.   Constitutional:   Alert and oriented. Non-toxic appearance. Eyes:   Conjunctivae are normal. EOMI. PERRL. ENT      Head:   Normocephalic and atraumatic.      Nose:   No congestion/rhinnorhea.       Mouth/Throat:   MMM, no pharyngeal erythema. No peritonsillar mass.       Neck:   No meningismus. Full ROM. Hematological/Lymphatic/Immunilogical:   No cervical lymphadenopathy. Cardiovascular:   RRR. Symmetric bilateral radial and DP pulses.  No murmurs. Cap refill less than 2 seconds. Respiratory:   Normal respiratory effort without tachypnea/retractions. Breath sounds are clear and equal bilaterally. No wheezes/rales/rhonchi. Gastrointestinal:   Soft with suprapubic tenderness. Non distended. There is no CVA tenderness.  No rebound, rigidity, or guarding. Musculoskeletal:   Normal range of motion in all extremities. No joint effusions.  No lower extremity tenderness.  No edema. Neurologic:   Normal speech and language.  Motor grossly intact. No acute focal neurologic deficits are appreciated.  Skin:    Skin is warm, dry and intact. No rash noted.  No petechiae, purpura, or bullae.  ____________________________________________    LABS (pertinent positives/negatives) (all labs ordered are listed, but only abnormal results are displayed) Labs  Reviewed - No data to display ____________________________________________   EKG    ____________________________________________    RADIOLOGY  No results found.  ____________________________________________  PROCEDURES Procedures  ____________________________________________    CLINICAL IMPRESSION / ASSESSMENT AND PLAN / ED COURSE  Pertinent labs & imaging results that were available during my care of the patient were reviewed by me and considered in my medical decision making (see chart for details).    Patient presents with generalized weakness.  Nontoxic.  Vital signs normal.  Will consult social work to evaluate for rehab placement versus in-home services.      ____________________________________________   FINAL CLINICAL IMPRESSION(S) / ED DIAGNOSES    Final diagnoses:  Generalized weakness  Lower urinary tract infectious disease     ED Discharge Orders    None      Portions of this note were generated with dragon dictation software. Dictation errors may occur despite best attempts at proofreading.    Sharman Cheek, MD 05/31/18 1620

## 2018-05-31 NOTE — ED Notes (Signed)
Pt remains in hallway bed.  Pt waiting on labs and urine results  Pt aware.

## 2018-06-01 MED ORDER — DIAZEPAM 2 MG PO TABS: 2.0000 mg | ORAL_TABLET | Freq: Three times a day (TID) | ORAL | 0 refills | Status: AC | PRN

## 2018-06-01 MED ORDER — OXYCODONE-ACETAMINOPHEN 5-325 MG PO TABS: 0.5000 | ORAL_TABLET | Freq: Three times a day (TID) | ORAL | 0 refills | Status: DC | PRN

## 2018-06-01 NOTE — Progress Notes (Signed)
LCSW called Rick at [facility] and he stated he will make a bed offer. [employer]- Will need authorization The PT is assessing patient for STR now will await PT report and upload it via the hub  Westly Hinnant Novinger LCSW [phone]

## 2018-06-01 NOTE — ED Notes (Signed)
Pt given breakfast tray

## 2018-06-01 NOTE — ED Notes (Signed)
Pt up to bedside commode with assistance. Pt is unable to ambulate with a walker and this RN. Pt is a fall risk. Bedside commode brought to the room for pt.

## 2018-06-01 NOTE — ED Notes (Signed)
Patient is resting comfortably. 

## 2018-06-01 NOTE — Evaluation (Signed)
Physical Therapy Evaluation Patient Details Name: Rhonda Griffith MRN: [id number] DOB: November 25, 1945 Today's Date: 06/01/2018   History of Present Illness  Ptis a 72 y.o. female with a history of atrial for ablation, chronic kidney disease, CVA, peripheral vascular disease who presents to the ED for weakness over the past few days.  Family states she has been weak this caused her to almost fall.  The fall was prevented and she was able to safely be seated on the floor but could not be ambulated afterwards.  She is complaining of bilateral knee pain, left greater than right.  She denies any recent illness or other complaints.  Assessment includes recent UTI, weakness, and dehydration.      Clinical Impression  Pt presents with deficits in strength, transfers, mobility, gait, balance, and activity tolerance.  Pt required extra time and effort for all bed mobility tasks and was unable to stand from a standard height surface.  With EOB elevated to the maximum safe height pt was able to stand with CGA and cues for sequencing but still required extra time and effort to come to full upright position.  Pt was able to amb a maximum of 8 feet with a RW and CGA with very slow, effortful cadence and short B step length.  Pt presented with mild instability in standing but was able to self-correct with heavy BUE support on the RW.  Upon returning to sitting pt presented with poor eccentric control.  Stair assessment not attempted due to safety concerns associated with pt's functional weakness.  Pt is at an elevated risk for falls and would be unsafe to return to her previous living situation at this time.  Pt will benefit from PT services in a SNF setting upon discharge to safely address above deficits for decreased caregiver assistance and eventual return to PLOF.      Follow Up Recommendations SNF    Equipment Recommendations  Other (comment)(TBD at next venue of care)    Recommendations for Other  Services       Precautions / Restrictions Precautions Precautions: Fall Restrictions Weight Bearing Restrictions: No      Mobility  Bed Mobility Overal bed mobility: Modified Independent             General bed mobility comments: Extra time and effort required but no physical assistance  Transfers Overall transfer level: Needs assistance Equipment used: Rolling walker (2 wheeled) Transfers: Sit to/from Stand Sit to Stand: From elevated surface;Min guard         General transfer comment: Pt unable to stand from standard height surface and required extra time and effort to stand from elevated surface; poor eccentric control during stand to sit  Ambulation/Gait Ambulation/Gait assistance: Min guard Gait Distance (Feet): 8 Feet Assistive device: Rolling walker (2 wheeled) Gait Pattern/deviations: Step-through pattern;Decreased step length - right;Decreased step length - left;Antalgic Gait velocity: Decreased   General Gait Details: Very slow, effortful cadence during limited amb with a RW, antalgic to B knees  Stairs            Wheelchair Mobility    Modified Rankin (Stroke Patients Only)       Balance Overall balance assessment: Mild deficits observed, not formally tested                                           Pertinent Vitals/Pain Pain Assessment: 0-10 Pain  Score: 3  Pain Location: L knee, chronic per pt Pain Descriptors / Indicators: Sore Pain Intervention(s): Monitored during session    Home Living Family/patient expects to be discharged to:: Private residence Living Arrangements: Children Available Help at Discharge: Family;Available 24 hours/day Type of Home: House Home Access: Stairs to enter Entrance Stairs-Rails: Right;Left;Can reach both Entrance Stairs-Number of Steps: 5 Home Layout: One level Home Equipment: Walker - 4 wheels Additional Comments: Pt believes she has other ADs in the attack but unsure of which  types    Prior Function Level of Independence: Independent with assistive device(s)         Comments: Pt Mod Ind with amb limited community distances with a rollator, Ind with ADLs, recent fall secondary to current weakness where pt slid with her back against a wall down to the floor and was unable to get up on her own     Hand Dominance        Extremity/Trunk Assessment   Upper Extremity Assessment Upper Extremity Assessment: Overall WFL for tasks assessed    Lower Extremity Assessment Lower Extremity Assessment: Generalized weakness;RLE deficits/detail;LLE deficits/detail RLE Deficits / Details: Hip flex 3/5, unable to assess knee strength secondary to pain which is chronic per pt RLE: Unable to fully assess due to pain LLE Deficits / Details: Hip flex 3/5, unable to assess knee strength secondary to pain which is chronic per pt LLE: Unable to fully assess due to pain       Communication   Communication: No difficulties  Cognition Arousal/Alertness: Awake/alert Behavior During Therapy: WFL for tasks assessed/performed Overall Cognitive Status: Within Functional Limits for tasks assessed                                        General Comments General comments (skin integrity, edema, etc.): Mild instability in standing with pt able to self correct with heavy use of the RW    Exercises Total Joint Exercises Ankle Circles/Pumps: AROM;Both;10 reps Quad Sets: Strengthening;Both;10 reps Gluteal Sets: Strengthening;Both;10 reps Heel Slides: AROM;Both;5 reps Hip ABduction/ADduction: AROM;Both;5 reps Straight Leg Raises: AROM;Both;5 reps Long Arc Quad: AROM;Both;10 reps Knee Flexion: Both;10 reps Marching in Standing: AROM;Both;10 reps;Standing Other Exercises Other Exercises: HEP education for BLE APs, GS, QS, and LAQ x 10 each 5-6x/day   Assessment/Plan    PT Assessment Patient needs continued PT services  PT Problem List Decreased  strength;Decreased balance;Decreased mobility;Decreased activity tolerance       PT Treatment Interventions DME instruction;Gait training;Stair training;Functional mobility training;Balance training;Therapeutic exercise;Therapeutic activities;Patient/family education    PT Goals (Current goals can be found in the Care Plan section)  Acute Rehab PT Goals Patient Stated Goal: To get stronger PT Goal Formulation: With patient Time For Goal Achievement: 06/14/18 Potential to Achieve Goals: Good    Frequency Min 2X/week   Barriers to discharge Inaccessible home environment Pt very weak functionally and at elevated risk for falls, would be unsafe to ascend/descend steps at her residence    Co-evaluation               AM-PAC PT "6 Clicks" Daily Activity  Outcome Measure Difficulty turning over in bed (including adjusting bedclothes, sheets and blankets)?: A Little Difficulty moving from lying on back to sitting on the side of the bed? : A Little Difficulty sitting down on and standing up from a chair with arms (e.g., wheelchair, bedside commode, etc,.)?: Unable Help  needed moving to and from a bed to chair (including a wheelchair)?: A Little Help needed walking in hospital room?: A Little Help needed climbing 3-5 steps with a railing? : A Lot 6 Click Score: 15    End of Session Equipment Utilized During Treatment: Gait belt Activity Tolerance: Patient limited by pain;Patient limited by fatigue Patient left: in bed;with bed alarm set;with call bell/phone within reach Nurse Communication: Mobility status PT Visit Diagnosis: Muscle weakness (generalized) (M62.81);Difficulty in walking, not elsewhere classified (R26.2)    Time: 0973-5329 PT Time Calculation (min) (ACUTE ONLY): 38 min   Charges:   PT Evaluation $PT Eval Low Complexity: 1 Low PT Treatments $Therapeutic Exercise: 8-22 mins        D. Scott Ronnell Makarewicz PT, DPT 06/01/18, 9:55 AM

## 2018-06-01 NOTE — ED Notes (Signed)
Pt. Awake and alert .

## 2018-06-01 NOTE — ED Provider Notes (Signed)
Vitals:   06/01/18 1200 06/01/18 1400  BP: (!) 131/59 (!) 122/49  Pulse: (!) 59 63  Resp:    Temp:    SpO2: 96% 97%     Patient resting comfortably, voices no concerns.  Agreeable and understanding with the plan to be discharged a [facility] for ongoing care.  Brief prescriptions for Percocet and Valium which the patient takes as a home medication provided as [facility] needs these "hardcopy" prescriptions in order to fill at least briefly until facility physician can follow-up with patient.  Patient alert, oriented.  No distress.  Normal vital signs, agreeable with plan for discharge going to [facility]. [facility] will be able to obtain and continue cipro Rx as well.   Sharyn Creamer, MD 06/01/18 1421

## 2018-06-01 NOTE — ED Notes (Signed)
[facility] Child psychotherapist  Has made arrangements  For transfer to  [place]  In  [place]

## 2018-06-01 NOTE — ED Notes (Signed)
Pt  Voided incontinently in diaper  Cleaned and new diaper applied

## 2018-06-01 NOTE — ED Notes (Signed)
Pt resting quietly at this time

## 2018-06-01 NOTE — ED Notes (Signed)
Pt urinated in bed. Pt cleaned, bed cleaned, new sheets and undergarments placed. Pt has no complaints at this time.

## 2018-06-01 NOTE — Progress Notes (Signed)
Per [facility] admissions coordinator at [facility] SNF authorization has been received and patient can come today to room E-13. RN will call report and arrange EMS for transport. Clinical Child psychotherapist (CSW) faxed AVS to [facility]. Patient is aware of above and in agreement with plan. Please reconsult if future social work needs arise. CSW signing off.   [employer], LCSW [phone]

## 2018-06-01 NOTE — Progress Notes (Signed)
LCSW met with patient and gave her the information and explained [facility] is a strong possibility. She was pleased at this point.   Claudine Bandi LCSW [phone] 

## 2018-06-01 NOTE — ED Notes (Signed)
Called for transport to [facility]  [phone]

## 2018-06-04 NOTE — Telephone Encounter (Signed)
Called and spoke to daughter about patient sending a remote transmission. Daughter states that patient is now in a rehab facility d/t weakness. Daughter states that she doesn't believe patient's weakness is ppm related. Daughter plans to send next remote transmission as scheduled (9/30). Daughter states that she will have patient to call if she has any further questions or concerns in the interim.

## 2018-06-12 LAB — CUP PACEART REMOTE DEVICE CHECK
Battery Impedance: 6955 Ohm
Implantable Lead Location: 753860
Implantable Lead Model: 5076
Implantable Pulse Generator Implant Date: 20090317
Lead Channel Impedance Value: 478 Ohm
Lead Channel Pacing Threshold Amplitude: 0.5 V
Lead Channel Pacing Threshold Pulse Width: 0.4 ms
Lead Channel Setting Pacing Amplitude: 2.5 V
Lead Channel Setting Pacing Pulse Width: 0.4 ms
MDC IDC LEAD IMPLANT DT: 20090317
MDC IDC MSMT BATTERY REMAINING LONGEVITY: 1 mo
MDC IDC MSMT BATTERY VOLTAGE: 2.64 V
MDC IDC MSMT LEADCHNL RA IMPEDANCE VALUE: 0 Ohm
MDC IDC SESS DTM: 20190828182852
MDC IDC SET LEADCHNL RV SENSING SENSITIVITY: 2.8 mV
MDC IDC STAT BRADY RV PERCENT PACED: 80 %

## 2018-06-22 ENCOUNTER — Telehealth: Payer: Self-pay | Admitting: Internal Medicine

## 2018-06-22 NOTE — Telephone Encounter (Signed)
Spoke with patient's daughter. She plans to transmit today since they will be out of town. Advised her that [facility] allows monitor to use cell signal rather than landline. Patient's daughter is appreciative of explanation.

## 2018-06-22 NOTE — Telephone Encounter (Signed)
Patient daughter calling stating that patient she is scheduled to have a Device check on Monday but she is going out of town for a week to her brothers home. He does not have a land line  Would like advise on this  Please advise

## 2018-06-22 NOTE — Telephone Encounter (Signed)
Spoke with patient and daughter. Advised that PPM remote transmission was received and shows ERI as of 06/22/18. Educated about VVI 65 and loss of rate response. Patient and daughter are agreeable to appointment with Dr. Graciela Husbands in [place] on 07/17/18 at 11:00am to discuss gen change. They are appreciative of call and deny questions or concerns at this time.

## 2018-06-25 ENCOUNTER — Ambulatory Visit (INDEPENDENT_AMBULATORY_CARE_PROVIDER_SITE_OTHER): Payer: Medicare Other | Admitting: *Deleted

## 2018-06-25 ENCOUNTER — Telehealth: Payer: Self-pay | Admitting: Cardiology

## 2018-06-25 DIAGNOSIS — I495 Sick sinus syndrome: Secondary | ICD-10-CM

## 2018-06-25 NOTE — Telephone Encounter (Signed)
LMOVM reminding pt to send remote transmission.   

## 2018-06-26 NOTE — Progress Notes (Signed)
Remote pacemaker transmission.   

## 2018-06-28 DIAGNOSIS — S82832A Other fracture of upper and lower end of left fibula, initial encounter for closed fracture: Secondary | ICD-10-CM | POA: Insufficient documentation

## 2018-06-29 LAB — CUP PACEART REMOTE DEVICE CHECK
Battery Impedance: 7667 Ohm
Battery Voltage: 2.62 V
Date Time Interrogation Session: 20190927202604
Implantable Lead Implant Date: 20090317
Implantable Lead Location: 753860
Implantable Lead Model: 5076
Implantable Pulse Generator Implant Date: 20090317
Lead Channel Impedance Value: 0 Ohm
Lead Channel Impedance Value: 429 Ohm
Lead Channel Setting Pacing Pulse Width: 1 ms
Lead Channel Setting Sensing Sensitivity: 2.8 mV
MDC IDC SET LEADCHNL RV PACING AMPLITUDE: 5 V
MDC IDC STAT BRADY RV PERCENT PACED: 81 %

## 2018-07-09 ENCOUNTER — Ambulatory Visit: Payer: Medicare Other | Admitting: Podiatry

## 2018-07-16 ENCOUNTER — Ambulatory Visit: Payer: Medicare Other | Admitting: Podiatry

## 2018-07-17 ENCOUNTER — Encounter: Payer: Medicare Other | Admitting: Internal Medicine

## 2018-07-26 ENCOUNTER — Ambulatory Visit (INDEPENDENT_AMBULATORY_CARE_PROVIDER_SITE_OTHER): Payer: Medicare Other | Admitting: *Deleted

## 2018-07-26 DIAGNOSIS — I495 Sick sinus syndrome: Secondary | ICD-10-CM

## 2018-07-26 DIAGNOSIS — I4821 Permanent atrial fibrillation: Secondary | ICD-10-CM

## 2018-07-27 NOTE — Progress Notes (Signed)
Remote pacemaker transmission.   

## 2018-08-03 ENCOUNTER — Encounter: Payer: Self-pay | Admitting: Emergency Medicine

## 2018-08-03 ENCOUNTER — Other Ambulatory Visit: Payer: Self-pay

## 2018-08-03 ENCOUNTER — Emergency Department: Payer: Medicare Other

## 2018-08-03 ENCOUNTER — Emergency Department
Admission: EM | Admit: 2018-08-03 | Discharge: 2018-08-04 | Disposition: A | Discharge status: Home / Self Care | Payer: Medicare Other | Attending: Emergency Medicine | Admitting: Emergency Medicine

## 2018-08-03 DIAGNOSIS — Y999 Unspecified external cause status: Secondary | ICD-10-CM | POA: Diagnosis not present

## 2018-08-03 DIAGNOSIS — Z7901 Long term (current) use of anticoagulants: Secondary | ICD-10-CM | POA: Diagnosis not present

## 2018-08-03 DIAGNOSIS — I130 Hypertensive heart and chronic kidney disease with heart failure and stage 1 through stage 4 chronic kidney disease, or unspecified chronic kidney disease: Secondary | ICD-10-CM | POA: Diagnosis not present

## 2018-08-03 DIAGNOSIS — Z23 Encounter for immunization: Secondary | ICD-10-CM | POA: Insufficient documentation

## 2018-08-03 DIAGNOSIS — I5032 Chronic diastolic (congestive) heart failure: Secondary | ICD-10-CM | POA: Diagnosis not present

## 2018-08-03 DIAGNOSIS — Z91040 Latex allergy status: Secondary | ICD-10-CM | POA: Insufficient documentation

## 2018-08-03 DIAGNOSIS — N181 Chronic kidney disease, stage 1: Secondary | ICD-10-CM | POA: Insufficient documentation

## 2018-08-03 DIAGNOSIS — Z79899 Other long term (current) drug therapy: Secondary | ICD-10-CM | POA: Insufficient documentation

## 2018-08-03 DIAGNOSIS — S0990XA Unspecified injury of head, initial encounter: Secondary | ICD-10-CM | POA: Diagnosis present

## 2018-08-03 DIAGNOSIS — Y929 Unspecified place or not applicable: Secondary | ICD-10-CM | POA: Diagnosis not present

## 2018-08-03 DIAGNOSIS — Y939 Activity, unspecified: Secondary | ICD-10-CM | POA: Insufficient documentation

## 2018-08-03 DIAGNOSIS — W0110XA Fall on same level from slipping, tripping and stumbling with subsequent striking against unspecified object, initial encounter: Secondary | ICD-10-CM | POA: Diagnosis not present

## 2018-08-03 DIAGNOSIS — S0181XA Laceration without foreign body of other part of head, initial encounter: Secondary | ICD-10-CM | POA: Diagnosis not present

## 2018-08-03 DIAGNOSIS — Z87891 Personal history of nicotine dependence: Secondary | ICD-10-CM | POA: Diagnosis not present

## 2018-08-03 DIAGNOSIS — D0330 Melanoma in situ of unspecified part of face: Secondary | ICD-10-CM | POA: Diagnosis not present

## 2018-08-03 LAB — CBC WITH DIFFERENTIAL/PLATELET
ABS IMMATURE GRANULOCYTES: 0.03 10*3/uL (ref 0.00–0.07)
BASOS ABS: 0.1 10*3/uL (ref 0.0–0.1)
BASOS PCT: 1 %
Eosinophils Absolute: 0.2 10*3/uL (ref 0.0–0.5)
Eosinophils Relative: 2 %
HCT: 45.3 % (ref 36.0–46.0)
HEMOGLOBIN: 13.9 g/dL (ref 12.0–15.0)
IMMATURE GRANULOCYTES: 0 %
LYMPHS PCT: 16 %
Lymphs Abs: 1.7 10*3/uL (ref 0.7–4.0)
MCH: 29 pg (ref 26.0–34.0)
MCHC: 30.7 g/dL (ref 30.0–36.0)
MCV: 94.4 fL (ref 80.0–100.0)
MONO ABS: 0.9 10*3/uL (ref 0.1–1.0)
Monocytes Relative: 8 %
NEUTROS ABS: 7.8 10*3/uL — AB (ref 1.7–7.7)
NEUTROS PCT: 73 %
PLATELETS: 316 10*3/uL (ref 150–400)
RBC: 4.8 MIL/uL (ref 3.87–5.11)
RDW: 13 % (ref 11.5–15.5)
WBC: 10.7 10*3/uL — AB (ref 4.0–10.5)
nRBC: 0 % (ref 0.0–0.2)

## 2018-08-03 LAB — BASIC METABOLIC PANEL
ANION GAP: 11 (ref 5–15)
BUN: 31 mg/dL — ABNORMAL HIGH (ref 8–23)
CO2: 28 mmol/L (ref 22–32)
CREATININE: 1.57 mg/dL — AB (ref 0.44–1.00)
Calcium: 10.5 mg/dL — ABNORMAL HIGH (ref 8.9–10.3)
Chloride: 102 mmol/L (ref 98–111)
GFR calc Af Amer: 36 mL/min — ABNORMAL LOW (ref >60)
GFR, EST NON AFRICAN AMERICAN: 31 mL/min — AB (ref >60)
Glucose, Bld: 122 mg/dL — ABNORMAL HIGH (ref 70–99)
Potassium: 4.2 mmol/L (ref 3.5–5.1)
SODIUM: 141 mmol/L (ref 135–145)

## 2018-08-03 LAB — PROTIME-INR
INR: 3.06
Prothrombin Time: 31.2 seconds — ABNORMAL HIGH (ref 11.4–15.2)

## 2018-08-03 MED ORDER — LIDOCAINE-EPINEPHRINE 2 %-1:100000 IJ SOLN
INTRAMUSCULAR | Status: AC
  Filled 2018-08-03: qty 1

## 2018-08-03 MED ORDER — LIDOCAINE-EPINEPHRINE 2 %-1:100000 IJ SOLN
20.0000 mL | Freq: Once | INTRAMUSCULAR | Status: AC
  Administered 2018-08-03: 20 mL

## 2018-08-03 MED ORDER — TETANUS-DIPHTH-ACELL PERTUSSIS 5-2.5-18.5 LF-MCG/0.5 IM SUSP
0.5000 mL | Freq: Once | INTRAMUSCULAR | Status: AC
  Administered 2018-08-03: 0.5 mL via INTRAMUSCULAR
  Filled 2018-08-03: qty 0.5

## 2018-08-03 NOTE — ED Notes (Signed)
Face cleansed of dried blood.

## 2018-08-03 NOTE — ED Triage Notes (Signed)
Pt states fall in kitchen. Pt states she is not sure how she fell, if she lost balance or tripped. Pt is wearing a CAM boot to left leg. Pt with approx 4 cm linear laceration with gaping noted to right forehead. Pt is on coumadin. Pt denies loc.

## 2018-08-03 NOTE — ED Notes (Signed)
Report to rachel, rn.  

## 2018-08-03 NOTE — ED Notes (Signed)
Patient transported to CT 

## 2018-08-04 NOTE — ED Provider Notes (Signed)
[facility] Emergency Department Provider Note  ___________________________________________   First MD Initiated Contact with Patient 08/03/18 2159     (approximate)  I have reviewed the triage vital signs and the nursing notes.   HISTORY  Chief Complaint Fall and Head Injury   HPI [name] is a 72 y.o. female with a history of atrial fibrillation on Coumadin who was presented to the emergency department after fall.  The patient says that she was washing dishes when she turned around and tripped over the orthopedic boot that she is wearing her left lower extremity.  Says that she had the corner of the kitchen counter when she was falling and sustained a laceration.  Patient denies any loss of consciousness.  Denies any lightheadedness at this time.  Mild headache over the front right side of the forehead where she sustained a laceration.  Patient does not know the date of her last tetanus shot   Past Medical History:  Diagnosis Date  . Atrial fibrillation, permanent    a. CHA2DS2VASc = 4-->coumadin;  b. 09/2014 Echo: EF 55-60%, normal wall motion, mild AI/AS, nl MV, mod dil LA, mildly dil RA, mild to mod TR, PASP .  [name] Chronic kidney disease, stage I   . CVA (cerebral infarction)    a. 04/2015 - anticoagulation switched from eliquis to xarelto to coumadin.  . Leukocytosis   . Pacemaker -Medtronic    a. implant for SSS  . PAD (peripheral artery disease) (HCC)    a. w/ left lower ext claudication s/p ABI's 04/2015 showing nl ABI on Right with abnl waveforms on left sugg of L SFA dzs.  [name] Restless leg syndrome   . 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    Patient Active Problem List   Diagnosis Date Noted  . ERRONEOUS ENCOUNTER--DISREGARD 07/03/2016  . Carotid bruit 12/22/2015  . Hyperlipidemia 10/23/2015  . Atrial fibrillation, permanent   . Pacemaker -Medtronic     . 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 Chronic kidney disease, unspecified   . Encounter for anticoagulation discussion and counseling 06/25/2015  . Acute cardioembolic stroke (HCC) 06/25/2015  . PAD (peripheral artery disease) (HCC) 06/25/2015  . Pure hypercholesterolemia 04/30/2015  . History of recent stroke 04/28/2015  . Essential hypertension 12/09/2014  . Gait instability 09/08/2014  . Status post total bilateral knee replacement 06/24/2014  . UTI (urinary tract infection) 02/03/2014  . Fatigue 10/01/2013  . Shortness of breath 09/12/2012  . Stress disorder, acute 09/12/2012  . Tachycardia 09/12/2012  . Atrial fibrillation (HCC) 09/12/2012  . Melanoma in situ (HCC) 07/11/2012  . Morphea 07/11/2012  . Long term (current) use of anticoagulants 07/02/2012  . History of malignant melanoma of skin 05/30/2012  . CMC arthritis 02/02/2012  . Sprain of MCL (medial collateral ligament) of knee 01/24/2012  . Squamous cell carcinoma 12/21/2011  . Carpal tunnel syndrome of left wrist 11/28/2011  . Sinoatrial node dysfunction (HCC)   . Movement disorder 08/08/2011  . Edema 06/07/2011  . Chronic diastolic heart failure (HCC) 05/27/2011  . Anxiety state 05/14/2011  . Esophageal reflux 05/14/2011  . Mitral valve replaced 05/14/2011  . Myalgia and myositis, unspecified 05/14/2011  . OSA (obstructive sleep apnea) 05/14/2011  . Osteoarthritis 05/14/2011  . Pulmonary hypertension (HCC) 05/14/2011  . Cardiac pacemaker in situ 10/03/2010    Past Surgical History:  Procedure Laterality Date  . bladder  injection    . botox bladder    . INSERT / REPLACE / REMOVE PACEMAKER     implant for SSS  . MELANOMA EXCISION     face  . MITRAL VALVE REPLACEMENT     s/p mechanical valve replaced with bovine valve 2010  . NASAL SINUS SURGERY      Prior to Admission medications   Medication Sig Start Date End Date Taking? Authorizing Provider  albuterol (PROVENTIL HFA;VENTOLIN HFA)  108 (90 BASE) MCG/ACT inhaler Inhale 2 puffs into the lungs every 6 (six) hours as needed for wheezing.    [provider]  ascorbic acid (VITAMIN C) 500 MG tablet Take 500 mg by mouth daily.    [provider]  atorvastatin (LIPITOR) 80 MG tablet Take 80 mg by mouth daily.    [provider]  benzonatate (TESSALON PERLES) 100 MG capsule Take 1 capsule (100 mg total) by mouth 3 (three) times daily as needed for cough. 03/24/18   Georgetta Haber, NP  calcium-vitamin D (OSCAL-500) 500-400 MG-UNIT per tablet Take 1 tablet by mouth 2 (two) times daily.    [provider]  Cholecalciferol (VITAMIN D3) 1000 UNITS CAPS Take 2,000 Units by mouth every 30 (thirty) days.     [provider]  diclofenac sodium (VOLTAREN) 1 % GEL Apply 1 application topically 2 (two) times daily.      [provider]  diltiazem (CARDIZEM CD) 120 MG 24 hr capsule Take 1 capsule (120 mg total) by mouth daily. 09/06/16   Antonieta Iba, MD  docusate sodium (COLACE) 100 MG capsule Take 100-200 mg by mouth daily as needed for mild constipation or moderate constipation.     [provider]  DULoxetine (CYMBALTA) 60 MG capsule Take 60 mg by mouth daily.     [provider]  fluticasone (FLONASE) 50 MCG/ACT nasal spray Place 1-2 sprays into both nostrils daily.     [provider]  furosemide (LASIX) 20 MG tablet Take 2 tablets (40 mg total) by mouth every other day. Patient taking differently: Take 20 mg by mouth daily as needed for edema.  09/06/16   Duke Salvia, MD  gabapentin (NEURONTIN) 100 MG capsule See admin instructions. Take 1 capsule (100MG ) by mouth every morning and 2 capsules (200MG ) by mouth every night 03/17/18   [provider]  ipratropium (ATROVENT) 0.06 % nasal spray Place 2 sprays into both nostrils 2 (two) times daily. 03/24/18   Linus Mako B, NP  magnesium oxide (MAG-OX) 400 MG tablet Take 400 mg by mouth 2 (two)  times daily.     [provider]  mirabegron ER (MYRBETRIQ) 50 MG TB24 tablet Take 50 mg by mouth daily. 09/02/16   [provider]  Multiple Vitamin (MULTIVITAMIN) tablet Take 1 tablet by mouth daily.    [provider]  oxyCODONE-acetaminophen (PERCOCET) 5-325 MG tablet Take 0.5 tablets by mouth every 8 (eight) hours as needed for severe pain. 06/01/18 06/01/19  Sharyn Creamer, MD  pantoprazole (PROTONIX) 40 MG tablet Take 40 mg by mouth daily.     [provider]  potassium chloride (K-DUR) 10 MEQ tablet Take 1 tablet (10 mEq total) by mouth daily as needed. Take with lasix 09/06/16   Antonieta Iba, MD  rOPINIRole (REQUIP) 2 MG tablet Take 2 mg by mouth at bedtime.  05/29/15   [provider]  solifenacin (VESICARE) 10 MG tablet Take 10 mg by mouth daily.  04/03/18   [provider]  warfarin (COUMADIN) 2 MG tablet See admin instructions. Take 1 tablet (2MG ) by mouth every Monday, Wednesday and Friday evening and 2 tablets (4MG ) every Tuesday, Thursday, Saturday and Sunday evening    [provider]    Allergies Morphine; Other; Statins; Tizanidine; Tape; Tapentadol; Cephalexin; Latex; and Sulfa antibiotics  Family History  Family history unknown: Yes    Social History Social History   Tobacco Use  . Smoking status: Former Smoker    Packs/day: 1.00    Years: 5.00    Pack years: 5.00    Types: Cigarettes    Last attempt to quit: 12/29/1970    Years since quitting: 47.6  . Smokeless tobacco: Never Used  Substance Use Topics  . Alcohol use: No  . Drug use: No    Review of Systems  Constitutional: No fever/chills Eyes: No visual changes. ENT: No sore throat. Cardiovascular: Denies chest pain. Respiratory: Denies shortness of breath. Gastrointestinal: No abdominal pain.  No nausea, no vomiting.  No diarrhea.  No constipation. Genitourinary: Negative for dysuria. Musculoskeletal: Negative for back pain. Skin: Negative for  rash. Neurological: Negative for  focal weakness or numbness.   ____________________________________________   PHYSICAL EXAM:  VITAL SIGNS: ED Triage Vitals  Enc Vitals Group     BP 08/03/18 2204 (!) 143/89     Pulse Rate 08/03/18 2201 64     Resp 08/03/18 2201 16     Temp 08/03/18 2201 97.8 F (36.6 C)     Temp Source 08/03/18 2201 Oral     SpO2 08/03/18 2201 99 %     Weight 08/03/18 2202 170 lb (77.1 kg)     Height 08/03/18 2202 5\' 9"  (1.753 m)     Head Circumference --      Peak Flow --      Pain Score 08/03/18 2202 0     Pain Loc --      Pain Edu? --      Excl. in GC? --     Constitutional: Alert and oriented. Well appearing and in no acute distress. Eyes: Conjunctivae are normal.  Head: Patient with 4 cm laceration over the right lateral eyebrow extending obliquely to the midline.  Laceration is through to the adipose but does not violate the galea.  Mild ecchymosis over the right maxillary region anteriorly without any tenderness to palpation.  Superficial abrasion as well as probably 1 cm without active bleeding at this time. Nose: No congestion/rhinnorhea. Mouth/Throat: Mucous membranes are moist.  Neck: No stridor.  No tenderness to palpation to the midline cervical spine.  No deformity or step-off. Cardiovascular: Normal rate, regular rhythm. Grossly normal heart sounds.   Respiratory: Normal respiratory effort.  No retractions. Lungs CTAB. Gastrointestinal: Soft and nontender. No distention. No CVA tenderness. Musculoskeletal: No lower extremity tenderness nor edema.  No joint effusions. Neurologic:  Normal speech and language. No gross focal neurologic deficits are appreciated. Skin:  Skin is warm, dry and intact. No rash noted. Psychiatric: Mood and affect are normal. Speech and behavior are normal.  ____________________________________________   LABS (all labs ordered are listed, but only abnormal results are displayed)  Labs Reviewed  CBC WITH  DIFFERENTIAL/PLATELET - Abnormal; Notable for the following components:      Result Value   WBC 10.7 (*)    Neutro Abs 7.8 (*)    All other components within normal limits  PROTIME-INR - Abnormal; Notable for the following components:   Prothrombin Time 31.2 (*)  All other components within normal limits  BASIC METABOLIC PANEL - Abnormal; Notable for the following components:   Glucose, Bld 122 (*)    BUN 31 (*)    Creatinine, Ser 1.57 (*)    Calcium 10.5 (*)    GFR calc non Af Amer 31 (*)    GFR calc Af Amer 36 (*)    All other components within normal limits   ____________________________________________  EKG   ____________________________________________  RADIOLOGY  CT head as well as maxillofacial without acute intracranial process. ____________________________________________   PROCEDURES  Procedure(s) performed:    Marland KitchenMarland KitchenLaceration Repair Date/Time: 08/04/2018 12:06 AM Performed by: Myrna Blazer, MD Authorized by: Myrna Blazer, MD   Consent:    Consent obtained:  Verbal   Consent given by:  Patient   Risks discussed:  Infection, pain, retained foreign body, poor cosmetic result and poor wound healing Anesthesia (see MAR for exact dosages):    Anesthesia method:  Local infiltration   Local anesthetic:  Lidocaine 1% WITH epi Laceration details:    Location:  Face   Face location:  Forehead   Length (cm):  4   Depth (mm):  5 Repair type:    Repair type:  Complex Pre-procedure details:    Preparation:  Patient was prepped and draped in usual sterile fashion Exploration:    Hemostasis achieved with:  Direct pressure   Wound exploration: entire depth of wound probed and visualized     Contaminated: no   Treatment:    Area cleansed with:  Saline   Amount of cleaning:  Extensive   Irrigation solution:  Sterile saline   Irrigation method:  Pressure wash and syringe   Visualized foreign bodies/material removed: no   Subcutaneous  repair:    Suture size:  5-0   Suture material:  Vicryl   Number of sutures:  2 Skin repair:    Repair method:  Sutures   Suture size:  5-0   Suture material:  Nylon   Number of sutures:  4 Approximation:    Approximation:  Close Post-procedure details:    Dressing:  Sterile dressing   Patient tolerance of procedure:  Tolerated well, no immediate complications    Critical Care performed:  ____________________________________________   INITIAL IMPRESSION / ASSESSMENT AND PLAN / ED COURSE  Pertinent labs & imaging results that were available during my care of the patient were reviewed by me and considered in my medical decision making (see chart for details).  DDX: Mechanical fall, scalp laceration, head injury, intracranial hemorrhage, facial bone fracture As part of my medical decision making, I reviewed the following data within the electronic MEDICAL RECORD NUMBER Notes from prior ED visits  ----------------------------------------- 12:09 AM on 08/04/2018 -----------------------------------------  Patient at this time with wounds repaired.  Reassuring imaging.  Coumadin just above goal of 3.  Patient aware of need to keep laceration dry for the next 24 hours and to have the sutures taken out in 5 days.  He is understanding the diagnosis as well as treatment and willing to comply.  Tetanus shot updated. ____________________________________________   FINAL CLINICAL IMPRESSION(S) / ED DIAGNOSES  Mechanical fall.  Facial laceration.  NEW MEDICATIONS STARTED DURING THIS VISIT:  New Prescriptions   No medications on file     Note:  This document was prepared using Dragon voice recognition software and may include unintentional dictation errors.     Myrna Blazer, MD 08/04/18 [phone]

## 2018-08-05 ENCOUNTER — Encounter: Payer: Self-pay | Admitting: Cardiology

## 2018-08-11 ENCOUNTER — Other Ambulatory Visit: Payer: Self-pay

## 2018-08-11 ENCOUNTER — Ambulatory Visit
Admission: EM | Admit: 2018-08-11 | Discharge: 2018-08-11 | Disposition: A | Discharge status: Home / Self Care | Payer: Medicare Other | Attending: Emergency Medicine | Admitting: Emergency Medicine

## 2018-08-11 DIAGNOSIS — S0181XD Laceration without foreign body of other part of head, subsequent encounter: Secondary | ICD-10-CM

## 2018-08-11 DIAGNOSIS — Z4802 Encounter for removal of sutures: Secondary | ICD-10-CM | POA: Diagnosis not present

## 2018-08-11 NOTE — ED Notes (Signed)
Scab removed with NS. 4 sutures removed over and into right eyebrow. Bacitracin applied

## 2018-08-11 NOTE — Discharge Instructions (Addendum)
This is still in the process of healing, so be careful with it, especially over the next few days.

## 2018-08-11 NOTE — ED Provider Notes (Signed)
HPI  SUBJECTIVE:  Rhonda Griffith is a 72 y.o. female who presents for suture removal.  She was in the ER 8 days ago for a mechanical fall where she sustained a 4 cm laceration to her right forehead.  Two 5-0 Vicryl deep sutures and 4 nylon 5-0 sutures were placed to close the wound.  Saw PMD 2 days ago, sutures were not ready to come out, patient currently has no complaints.  States it is healing well.  No purulent drainage, fevers, further bleeding.  She has been doing warm soaks to it for the past 2 days.  There are no aggravating or alleviating factors. She has an extensive past medical history including atrial fibrillation, stroke.  PMD: [name], MD   Past Medical History:  Diagnosis Date  . Atrial fibrillation, permanent    a. CHA2DS2VASc = 4-->coumadin;  b. 09/2014 Echo: EF 55-60%, normal wall motion, mild AI/AS, nl MV, mod dil LA, mildly dil RA, mild to mod TR, PASP .  [name] Chronic kidney disease, stage I   . CVA (cerebral infarction)    a. 04/2015 - anticoagulation switched from eliquis to xarelto to coumadin.  . Leukocytosis   . Pacemaker -Medtronic    a. implant for SSS  . PAD (peripheral artery disease) (HCC)    a. w/ left lower ext claudication s/p ABI's 04/2015 showing nl ABI on Right with abnl waveforms on left sugg of L SFA dzs.  [name] Restless leg syndrome   . 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    Past Surgical History:  Procedure Laterality Date  . bladder injection    . botox bladder    . INSERT / REPLACE / REMOVE PACEMAKER     implant for SSS  . MELANOMA EXCISION     face  . MITRAL VALVE REPLACEMENT     s/p mechanical valve replaced with bovine valve 2010  . NASAL SINUS SURGERY      Family History  Family history unknown: Yes    Social History   Tobacco Use  . Smoking status: Former Smoker    Packs/day: 1.00    Years: 5.00    Pack years: 5.00   Types: Cigarettes    Last attempt to quit: 12/29/1970    Years since quitting: 47.6  . Smokeless tobacco: Never Used  Substance Use Topics  . Alcohol use: No  . Drug use: No    No current facility-administered medications for this encounter.   Current Outpatient Medications:  .  albuterol (PROVENTIL HFA;VENTOLIN HFA) 108 (90 BASE) MCG/ACT inhaler, Inhale 2 puffs into the lungs every 6 (six) hours as needed for wheezing., Disp: , Rfl:  .  ascorbic acid (VITAMIN C) 500 MG tablet, Take 500 mg by mouth daily., Disp: , Rfl:  .  atorvastatin (LIPITOR) 80 MG tablet, Take 80 mg by mouth daily., Disp: , Rfl:  .  calcium-vitamin D (OSCAL-500) 500-400 MG-UNIT per tablet, Take 1 tablet by mouth 2 (two) times daily., Disp: , Rfl:  .  Cholecalciferol (VITAMIN D3) 1000 UNITS CAPS, Take 2,000 Units by mouth every 30 (thirty) days. , Disp: , Rfl:  .  diclofenac sodium (VOLTAREN) 1 % GEL, Apply 1 application topically 2 (two) times daily.  , Disp: , Rfl:  .  diltiazem (CARDIZEM CD) 120 MG 24 hr capsule, Take 1 capsule (120 mg total) by mouth daily., Disp: 90 capsule, Rfl: 3 .  docusate  sodium (COLACE) 100 MG capsule, Take 100-200 mg by mouth daily as needed for mild constipation or moderate constipation. , Disp: , Rfl:  .  DULoxetine (CYMBALTA) 60 MG capsule, Take 60 mg by mouth daily. , Disp: , Rfl:  .  fluticasone (FLONASE) 50 MCG/ACT nasal spray, Place 1-2 sprays into both nostrils daily. , Disp: , Rfl:  .  furosemide (LASIX) 20 MG tablet, Take 2 tablets (40 mg total) by mouth every other day. (Patient taking differently: Take 20 mg by mouth daily as needed for edema. ), Disp: 90 tablet, Rfl: 3 .  gabapentin (NEURONTIN) 100 MG capsule, See admin instructions. Take 1 capsule (100MG ) by mouth every morning and 2 capsules (200MG ) by mouth every night, Disp: , Rfl: 5 .  ipratropium (ATROVENT) 0.06 % nasal spray, Place 2 sprays into both nostrils 2 (two) times daily., Disp: 15 mL, Rfl: 12 .  magnesium oxide  (MAG-OX) 400 MG tablet, Take 400 mg by mouth 2 (two) times daily. , Disp: , Rfl:  .  mirabegron ER (MYRBETRIQ) 50 MG TB24 tablet, Take 50 mg by mouth daily., Disp: , Rfl:  .  Multiple Vitamin (MULTIVITAMIN) tablet, Take 1 tablet by mouth daily., Disp: , Rfl:  .  oxyCODONE-acetaminophen (PERCOCET) 5-325 MG tablet, Take 0.5 tablets by mouth every 8 (eight) hours as needed for severe pain., Disp: 6 tablet, Rfl: 0 .  pantoprazole (PROTONIX) 40 MG tablet, Take 40 mg by mouth daily. , Disp: , Rfl:  .  potassium chloride (K-DUR) 10 MEQ tablet, Take 1 tablet (10 mEq total) by mouth daily as needed. Take with lasix, Disp: 90 tablet, Rfl: 3 .  rOPINIRole (REQUIP) 2 MG tablet, Take 2 mg by mouth at bedtime. , Disp: , Rfl:  .  solifenacin (VESICARE) 10 MG tablet, Take 10 mg by mouth daily. , Disp: , Rfl:  .  warfarin (COUMADIN) 2 MG tablet, See admin instructions. Take 1 tablet (2MG ) by mouth every Monday, Wednesday and Friday evening and 2 tablets (4MG ) every Tuesday, Thursday, Saturday and Sunday evening, Disp: , Rfl:   Allergies  Allergen Reactions  . Morphine Nausea And Vomiting  . Other Other (See Comments)    Several different statins.  . Statins Other (See Comments)    Several different statins.  . Tizanidine Palpitations  . Tape Other (See Comments)    Breaks her skin Allergic to plastic/latex tape. Only use paper tape on patient.  . Tapentadol Other (See Comments)    Allergic to plastic/latex tape. Only use paper tape on patient.  . Cephalexin Rash  . Latex Rash  . Sulfa Antibiotics Rash and Other (See Comments)    Allergic to plastic/latex tape. Only use paper tape on patient. Allergic to plastic/latex tape. Only use paper tape on patient.     ROS  As noted in HPI.   Physical Exam  BP (!) 150/65 (BP Location: Right Arm)   Pulse 65   Temp 97.7 F (36.5 C) (Oral)   Resp 16   Ht 5\' 9"  (1.753 m)   Wt 77.1 kg   SpO2 97%   BMI 25.10 kg/m   Constitutional: Well developed, well  nourished, no acute distress Eyes:  EOMI, conjunctiva normal bilaterally HENT: Normocephalic, mucus membranes moist, well-healed 4 cm laceration superior to it extending through her right eyebrow with 4 sutures intact.  Large scab.  No evidence of infection no surrounding erythema, edema. Respiratory: Normal inspiratory effort Cardiovascular: Normal rate GI: nondistended skin: No rash, skin intact Musculoskeletal: no  deformities Neurologic: Alert & oriented x 3, no focal neuro deficits Psychiatric: Speech and behavior appropriate   ED Course   Medications - No data to display  No orders of the defined types were placed in this encounter.   No results found for this or any previous visit (from the past 24 hour(s)). No results found.  ED Clinical Impression  Visit for suture removal  Facial laceration, subsequent encounter   ED Assessment/Plan  ER records reviewed.  As noted in HPI.  Scab was soaked in warm water and bacitracin with removal.  Nurse then removed four sutures, trace and placed bcaitracin, patient tolerated procedure well.  Advised patient that she is still in the process of healing, so she will need to be careful with this, particularly over the next several days.  May continue bacitracin as needed.  No orders of the defined types were placed in this encounter.   *This clinic note was created using Dragon dictation software. Therefore, there may be occasional mistakes despite careful proofreading.   ?   Domenick Gong, MD 08/11/18 1019

## 2018-08-11 NOTE — ED Triage Notes (Addendum)
[facility]. Went to PCP for removal but was told it was too early and to come back in two days. Pt didn't go back yesterday as scheduled. Here for suture removal. Large scab over wound.

## 2018-08-21 ENCOUNTER — Ambulatory Visit (INDEPENDENT_AMBULATORY_CARE_PROVIDER_SITE_OTHER): Payer: Medicare Other | Admitting: Internal Medicine

## 2018-08-21 ENCOUNTER — Encounter: Payer: Self-pay | Admitting: Internal Medicine

## 2018-08-21 ENCOUNTER — Encounter: Payer: Self-pay | Admitting: *Deleted

## 2018-08-21 VITALS — BP 140/80 | HR 65 | Ht 69.0 in | Wt 164.2 lb

## 2018-08-21 DIAGNOSIS — Z950 Presence of cardiac pacemaker: Secondary | ICD-10-CM

## 2018-08-21 DIAGNOSIS — I495 Sick sinus syndrome: Secondary | ICD-10-CM

## 2018-08-21 DIAGNOSIS — Z01812 Encounter for preprocedural laboratory examination: Secondary | ICD-10-CM

## 2018-08-21 DIAGNOSIS — I10 Essential (primary) hypertension: Secondary | ICD-10-CM

## 2018-08-21 DIAGNOSIS — R001 Bradycardia, unspecified: Secondary | ICD-10-CM | POA: Diagnosis not present

## 2018-08-21 DIAGNOSIS — I4821 Permanent atrial fibrillation: Secondary | ICD-10-CM | POA: Diagnosis not present

## 2018-08-21 LAB — CUP PACEART INCLINIC DEVICE CHECK
Brady Statistic RV Percent Paced: 81 %
Implantable Lead Implant Date: 20090317
Implantable Lead Location: 753860
Lead Channel Impedance Value: 479 Ohm
Lead Channel Pacing Threshold Amplitude: 0.5 V
Lead Channel Pacing Threshold Pulse Width: 1 ms
Lead Channel Sensing Intrinsic Amplitude: 8 mV
Lead Channel Setting Pacing Pulse Width: 1 ms
MDC IDC MSMT BATTERY IMPEDANCE: 14052 Ohm
MDC IDC MSMT BATTERY VOLTAGE: 2.29 V
MDC IDC MSMT LEADCHNL RA IMPEDANCE VALUE: 0 Ohm
MDC IDC PG IMPLANT DT: 20090317
MDC IDC SESS DTM: 20191126163419
MDC IDC SET LEADCHNL RV PACING AMPLITUDE: 2.5 V
MDC IDC SET LEADCHNL RV SENSING SENSITIVITY: 2.8 mV

## 2018-08-21 NOTE — Progress Notes (Signed)
skf      Patient Care Team: Shapely-Quinn, Desiree Lucy, MD as PCP - General (Family Medicine) Antonieta Iba, MD as Consulting Physician (Cardiology) Duke Salvia, MD as Consulting Physician (Cardiology) Iran Ouch, MD as Consulting Physician (Cardiology)   HPI  Rhonda Griffith is a 72 y.o. female Former patient of Dr. Leonia Reeves who underwent pacemaker implantation 2000 for symptomatic bradycardia in the context of atrial fibrillation. Gen rep 3/09 with RV lead revision  She has reached ERI  History of mitral  valve disease status post bioprosthetic mitral valve replacement.    DATE TEST EF   1/16    Echo  69 % Moderate LAE   4/18    Echo  60 % LVH/ Severe LAE 52/2.6/73-- nl MV function          Intercurrent hx notable for stroke and was changed from apixoban to Rivaroxaban at [facility]   Now on coumadin  NO bleeding  She is semiambulatory.  She uses a walker.  She is at home.    She walks around without dyspnea or chest pain; mild edema.   Has had fall and foot fracture.  Also seen in ER for UTI and weakness Date Cr K Mg Hgb  12/16 1.27 3.4  11.9  6/18 1.0 3.3 1.6    12/18 1.2 4.3 2.1   11/19 1.57 4.2  13.9         Past Medical History:  Diagnosis Date  . Atrial fibrillation, permanent    a. CHA2DS2VASc = 4-->coumadin;  b. 09/2014 Echo: EF 55-60%, normal wall motion, mild AI/AS, nl MV, mod dil LA, mildly dil RA, mild to mod TR, PASP .  [name] Chronic kidney disease, stage I   . CVA (cerebral infarction)    a. 04/2015 - anticoagulation switched from eliquis to xarelto to coumadin.  . Leukocytosis   . Pacemaker -Medtronic    a. implant for SSS  . PAD (peripheral artery disease) (HCC)    a. w/ left lower ext claudication s/p ABI's 04/2015 showing nl ABI on Right with abnl waveforms on left sugg of L SFA dzs.  [name] Restless leg syndrome   . 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    Past  Surgical History:  Procedure Laterality Date  . bladder injection    . botox bladder    . INSERT / REPLACE / REMOVE PACEMAKER     implant for SSS  . MELANOMA EXCISION     face  . MITRAL VALVE REPLACEMENT     s/p mechanical valve replaced with bovine valve 2010  . NASAL SINUS SURGERY      Current Outpatient Medications  Medication Sig Dispense Refill  . albuterol (PROVENTIL HFA;VENTOLIN HFA) 108 (90 BASE) MCG/ACT inhaler Inhale 2 puffs into the lungs every 6 (six) hours as needed for wheezing.    [name] ascorbic acid (VITAMIN C) 500 MG tablet Take 500 mg by mouth daily.    [name] atorvastatin (LIPITOR) 80 MG tablet Take 80 mg by mouth daily.    . calcium-vitamin D (OSCAL-500) 500-400 MG-UNIT per tablet Take 1 tablet by mouth 2 (two) times daily.    . Cholecalciferol (VITAMIN D3) 1000 UNITS CAPS Take 2,000 Units by mouth every 30 (thirty) days.     . diclofenac sodium (VOLTAREN) 1 % GEL Apply 1 application topically 2 (two) times daily.      [name] diltiazem (CARDIZEM CD) 120  MG 24 hr capsule Take 1 capsule (120 mg total) by mouth daily. 90 capsule 3  . docusate sodium (COLACE) 100 MG capsule Take 100-200 mg by mouth daily as needed for mild constipation or moderate constipation.     . DULoxetine (CYMBALTA) 60 MG capsule Take 60 mg by mouth daily.     . fluticasone (FLONASE) 50 MCG/ACT nasal spray Place 1-2 sprays into both nostrils daily.     . furosemide (LASIX) 20 MG tablet Take 20 mg by mouth daily as needed.    . gabapentin (NEURONTIN) 100 MG capsule See admin instructions. Take 1 capsule (100MG ) by mouth every morning and 2 capsules (200MG ) by mouth every night  5  . ipratropium (ATROVENT) 0.06 % nasal spray Place 2 sprays into both nostrils 2 (two) times daily. 15 mL 12  . magnesium oxide (MAG-OX) 400 MG tablet Take 400 mg by mouth 2 (two) times daily.     . mirabegron ER (MYRBETRIQ) 50 MG TB24 tablet Take 50 mg by mouth daily.    . Multiple Vitamin (MULTIVITAMIN) tablet Take 1 tablet by mouth  daily.    [name] oxyCODONE-acetaminophen (PERCOCET) 5-325 MG tablet Take 0.5 tablets by mouth every 8 (eight) hours as needed for severe pain. 6 tablet 0  . pantoprazole (PROTONIX) 40 MG tablet Take 40 mg by mouth daily.     . potassium chloride (K-DUR) 10 MEQ tablet Take 1 tablet (10 mEq total) by mouth daily as needed. Take with lasix 90 tablet 3  . rOPINIRole (REQUIP) 2 MG tablet Take 2 mg by mouth at bedtime.     . solifenacin (VESICARE) 10 MG tablet Take 10 mg by mouth daily.     [name] warfarin (COUMADIN) 2 MG tablet See admin instructions. Take 1 tablet (2MG ) by mouth every Monday, Wednesday and Friday evening and 2 tablets (4MG ) every Tuesday, Thursday, Saturday and Sunday evening     No current facility-administered medications for this visit.     Allergies  Allergen Reactions  . Morphine Nausea And Vomiting  . Other Other (See Comments)    Several different statins.  . Statins Other (See Comments)    Several different statins.  . Tizanidine Palpitations  . Tape Other (See Comments)    Breaks her skin Allergic to plastic/latex tape. Only use paper tape on patient.  . Tapentadol Other (See Comments)    Allergic to plastic/latex tape. Only use paper tape on patient.  . Cephalexin Rash  . Latex Rash  . Sulfa Antibiotics Rash and Other (See Comments)    Allergic to plastic/latex tape. Only use paper tape on patient. Allergic to plastic/latex tape. Only use paper tape on patient.    Review of Systems negative except from HPI and PMH  Physical Exam BP 140/80 (BP Location: Left Arm, Patient Position: Sitting, Cuff Size: Normal)   Pulse 65   Ht 5\' 9"  (1.753 m)   Wt 164 lb 4 oz (74.5 kg)   BMI 24.26 kg/m  Well developed and nourished in no acute distress HENT normal Neck supple with JVP-flat Clear Device pocket well healed; without hematoma or erythema.  There is no tethering  Regular rate and rhythm, no murmurs or gallops Abd-soft with active BS No Clubbing cyanosis tr  edema Skin-warm and dry A & Oriented  Walks w walker     ECG afib with Vpacing @ 65   Assessment and plan  Atrial fibrillation -permanent  Bradycardia  Pacemaker-Medtronic with lead replacement  Device at [facility]  History of mitral valve replacement, mechanical followed by bioprosthetic most recently 2010  Stroke  Hypertension    BP well controlled  On Anticoagulation;  No bleeding issues   [facility] will anticipate AEGIS pouch.  With prior stroke, will do on warfarin, having her take 1/2 dose 3 and 2 nights before the procedure, and normal dose the night before the procedure

## 2018-08-21 NOTE — Patient Instructions (Signed)
Medication Instructions:  - Your physician recommends that you continue on your current medications as directed. Please refer to the Current Medication list given to you today.  If you need a refill on your cardiac medications before your next appointment, please call your pharmacy.   Lab work: - We will draw a PT/ INR out of your arm today and fax to your PCP  - Your physician recommends that you return for pre-procedure lab work: the week of December 16- BMP/ CBC/ INR- please go to the Medical Mall at [facility], 1st desk on the right to check in  If you have labs (blood work) drawn today and your tests are completely normal, you will receive your results only by: [name]. MyChart Message (if you have MyChart) OR . A paper copy in the mail If you have any lab test that is abnormal or we need to change your treatment, we will call you to review the results.  Testing/Procedures: - Your physician has recommended that you have a pacemaker generator (battery) change. Please see the instruction sheet given to you today for more information.  Follow-Up: At [facility], you and your health needs are our priority.  As part of our continuing mission to provide you with exceptional heart care, we have created designated Provider Care Teams.  These Care Teams include your primary Cardiologist (physician) and Advanced Practice Providers (APPs -  Physician Assistants and Nurse Practitioners) who all work together to provide you with the care you need, when you need it. . 10-14 days (from 09/21/18) with the Device Clinic for a wound check appointment   . 91 days (from 09/21/18) with Dr. Graciela HusbandsKlein  Any Other Special Instructions Will Be Listed Below (If Applicable). - N/A

## 2018-08-22 LAB — PROTIME-INR
INR: 4.6 — AB (ref 0.8–1.2)
Prothrombin Time: 42.1 s — ABNORMAL HIGH (ref 9.1–12.0)

## 2018-08-27 ENCOUNTER — Telehealth: Payer: Self-pay

## 2018-08-27 NOTE — Telephone Encounter (Signed)
Spoke with pt and reminded pt of remote transmission that is due today. Pt verbalized understanding.   

## 2018-08-30 ENCOUNTER — Encounter: Payer: Self-pay | Admitting: Cardiology

## 2018-08-30 DIAGNOSIS — L0390 Cellulitis, unspecified: Secondary | ICD-10-CM | POA: Insufficient documentation

## 2018-09-11 ENCOUNTER — Telehealth: Payer: Self-pay | Admitting: Internal Medicine

## 2018-09-11 NOTE — Telephone Encounter (Signed)
Pt's primary MD Dr Noel Journeyodd Shapley-Quinn manages and monitors pt's Warfarin.  They have received and addressed this result.  This is just a FYI for Dr Graciela HusbandsKlein per pt and pt's daughter's request given upcoming generator change out on 09/21/18.  Will forward message to Dr Graciela HusbandsKlein.

## 2018-09-11 NOTE — Telephone Encounter (Signed)
Elease Hashimotoatricia a home care nurse calling States she had received an order to complete INR Results were 2.5 Patient is currently taking 2.5 MG of coumadin every evening

## 2018-09-11 NOTE — Telephone Encounter (Signed)
Patient scheduled for a PPM generator change on 09/21/18.  She has already been given instructions for coumadin in regards to her changeout. The was documented in her pre procedure letter dated 08/21/18 and reviewed with the patient and her daughter:   "Coumadin (Warfarin)- take a 1/2 of your usual dose on 12/24 & 12/25, then take your usual dose on 12/26"

## 2018-09-14 ENCOUNTER — Telehealth: Payer: Self-pay

## 2018-09-14 NOTE — Telephone Encounter (Signed)
Perfect the way it is

## 2018-09-14 NOTE — Telephone Encounter (Signed)
Pt's daughter, Lupita LeashDonna, aware pt should report to short stay on 12/27 @ 1000 for her procedure. Lupita LeashDonna is aware all additional instructions will remain the same.

## 2018-09-21 ENCOUNTER — Ambulatory Visit (HOSPITAL_COMMUNITY)
Admission: RE | Admit: 2018-09-21 | Discharge: 2018-09-21 | Disposition: A | Discharge status: Home / Self Care | Payer: Medicare Other | Attending: Internal Medicine | Admitting: Internal Medicine

## 2018-09-21 ENCOUNTER — Other Ambulatory Visit: Payer: Self-pay

## 2018-09-21 ENCOUNTER — Ambulatory Visit (HOSPITAL_COMMUNITY)
Admission: RE | Disposition: A | Discharge status: Home / Self Care | Payer: Self-pay | Source: Home / Self Care | Attending: Internal Medicine

## 2018-09-21 DIAGNOSIS — Z881 Allergy status to other antibiotic agents status: Secondary | ICD-10-CM | POA: Insufficient documentation

## 2018-09-21 DIAGNOSIS — Z885 Allergy status to narcotic agent status: Secondary | ICD-10-CM | POA: Insufficient documentation

## 2018-09-21 DIAGNOSIS — I129 Hypertensive chronic kidney disease with stage 1 through stage 4 chronic kidney disease, or unspecified chronic kidney disease: Secondary | ICD-10-CM | POA: Diagnosis not present

## 2018-09-21 DIAGNOSIS — G2581 Restless legs syndrome: Secondary | ICD-10-CM | POA: Diagnosis not present

## 2018-09-21 DIAGNOSIS — I495 Sick sinus syndrome: Secondary | ICD-10-CM | POA: Diagnosis present

## 2018-09-21 DIAGNOSIS — Z882 Allergy status to sulfonamides status: Secondary | ICD-10-CM | POA: Insufficient documentation

## 2018-09-21 DIAGNOSIS — Z79899 Other long term (current) drug therapy: Secondary | ICD-10-CM | POA: Insufficient documentation

## 2018-09-21 DIAGNOSIS — Z87891 Personal history of nicotine dependence: Secondary | ICD-10-CM | POA: Insufficient documentation

## 2018-09-21 DIAGNOSIS — Z888 Allergy status to other drugs, medicaments and biological substances status: Secondary | ICD-10-CM | POA: Insufficient documentation

## 2018-09-21 DIAGNOSIS — I4821 Permanent atrial fibrillation: Secondary | ICD-10-CM

## 2018-09-21 DIAGNOSIS — N181 Chronic kidney disease, stage 1: Secondary | ICD-10-CM | POA: Insufficient documentation

## 2018-09-21 DIAGNOSIS — Z45010 Encounter for checking and testing of cardiac pacemaker pulse generator [battery]: Secondary | ICD-10-CM | POA: Diagnosis not present

## 2018-09-21 DIAGNOSIS — Z7901 Long term (current) use of anticoagulants: Secondary | ICD-10-CM | POA: Insufficient documentation

## 2018-09-21 DIAGNOSIS — Z8673 Personal history of transient ischemic attack (TIA), and cerebral infarction without residual deficits: Secondary | ICD-10-CM | POA: Diagnosis not present

## 2018-09-21 DIAGNOSIS — Z953 Presence of xenogenic heart valve: Secondary | ICD-10-CM | POA: Diagnosis not present

## 2018-09-21 HISTORY — PX: PPM GENERATOR CHANGEOUT: EP1233

## 2018-09-21 LAB — BASIC METABOLIC PANEL
Anion gap: 6 (ref 5–15)
BUN: 13 mg/dL (ref 8–23)
CO2: 28 mmol/L (ref 22–32)
CREATININE: 1.2 mg/dL — AB (ref 0.44–1.00)
Calcium: 10.1 mg/dL (ref 8.9–10.3)
Chloride: 106 mmol/L (ref 98–111)
GFR calc non Af Amer: 44 mL/min — ABNORMAL LOW (ref >60)
GFR, EST AFRICAN AMERICAN: 52 mL/min — AB (ref >60)
Glucose, Bld: 104 mg/dL — ABNORMAL HIGH (ref 70–99)
Potassium: 4.5 mmol/L (ref 3.5–5.1)
SODIUM: 140 mmol/L (ref 135–145)

## 2018-09-21 LAB — CBC
HCT: 37.5 % (ref 36.0–46.0)
Hemoglobin: 11 g/dL — ABNORMAL LOW (ref 12.0–15.0)
MCH: 27.6 pg (ref 26.0–34.0)
MCHC: 29.3 g/dL — ABNORMAL LOW (ref 30.0–36.0)
MCV: 94.2 fL (ref 80.0–100.0)
PLATELETS: 299 10*3/uL (ref 150–400)
RBC: 3.98 MIL/uL (ref 3.87–5.11)
RDW: 13.9 % (ref 11.5–15.5)
WBC: 6.7 10*3/uL (ref 4.0–10.5)
nRBC: 0 % (ref 0.0–0.2)

## 2018-09-21 LAB — PROTIME-INR
INR: 1.73
Prothrombin Time: 20 seconds — ABNORMAL HIGH (ref 11.4–15.2)

## 2018-09-21 LAB — SURGICAL PCR SCREEN
MRSA, PCR: NEGATIVE
Staphylococcus aureus: NEGATIVE

## 2018-09-21 SURGERY — PPM GENERATOR CHANGEOUT

## 2018-09-21 MED ORDER — SODIUM CHLORIDE 0.9 % IV SOLN
80.0000 mg | INTRAVENOUS | Status: AC
  Administered 2018-09-21: 80 mg
  Filled 2018-09-21: qty 2

## 2018-09-21 MED ORDER — LIDOCAINE HCL (PF) 1 % IJ SOLN
INTRAMUSCULAR | Status: DC | PRN
  Administered 2018-09-21: 60 mL

## 2018-09-21 MED ORDER — SODIUM CHLORIDE 0.9 % IV SOLN: INTRAVENOUS | Status: AC

## 2018-09-21 MED ORDER — FENTANYL CITRATE (PF) 100 MCG/2ML IJ SOLN
INTRAMUSCULAR | Status: AC
  Filled 2018-09-21: qty 2

## 2018-09-21 MED ORDER — SODIUM CHLORIDE 0.9 % IV SOLN
INTRAVENOUS | Status: DC
  Administered 2018-09-21: 10:00:00 via INTRAVENOUS

## 2018-09-21 MED ORDER — ACETAMINOPHEN 325 MG PO TABS: 325.0000 mg | ORAL_TABLET | ORAL | Status: DC | PRN

## 2018-09-21 MED ORDER — ONDANSETRON HCL 4 MG/2ML IJ SOLN: 4.0000 mg | Freq: Four times a day (QID) | INTRAMUSCULAR | Status: DC | PRN

## 2018-09-21 MED ORDER — CHLORHEXIDINE GLUCONATE 4 % EX LIQD
60.0000 mL | Freq: Once | CUTANEOUS | Status: DC
  Filled 2018-09-21: qty 60

## 2018-09-21 MED ORDER — MIDAZOLAM HCL 5 MG/5ML IJ SOLN
INTRAMUSCULAR | Status: AC
  Filled 2018-09-21: qty 5

## 2018-09-21 MED ORDER — CEFAZOLIN SODIUM-DEXTROSE 2-4 GM/100ML-% IV SOLN
INTRAVENOUS | Status: AC
  Filled 2018-09-21: qty 100

## 2018-09-21 MED ORDER — MUPIROCIN 2 % EX OINT
TOPICAL_OINTMENT | CUTANEOUS | Status: AC
  Filled 2018-09-21: qty 22

## 2018-09-21 MED ORDER — LIDOCAINE HCL 1 % IJ SOLN
INTRAMUSCULAR | Status: AC
  Filled 2018-09-21: qty 60

## 2018-09-21 MED ORDER — MUPIROCIN 2 % EX OINT
1.0000 "application " | TOPICAL_OINTMENT | Freq: Once | CUTANEOUS | Status: AC
  Administered 2018-09-21: 1 via TOPICAL

## 2018-09-21 MED ORDER — VANCOMYCIN HCL IN DEXTROSE 1-5 GM/200ML-% IV SOLN
1000.0000 mg | INTRAVENOUS | Status: AC
  Administered 2018-09-21: 1000 mg via INTRAVENOUS
  Filled 2018-09-21: qty 200

## 2018-09-21 MED ORDER — VANCOMYCIN HCL IN DEXTROSE 1-5 GM/200ML-% IV SOLN
INTRAVENOUS | Status: AC
  Filled 2018-09-21: qty 200

## 2018-09-21 MED ORDER — SODIUM CHLORIDE 0.9 % IV SOLN
INTRAVENOUS | Status: AC
  Filled 2018-09-21: qty 2

## 2018-09-21 SURGICAL SUPPLY — 7 items
CABLE SURGICAL S-101-97-12 (CABLE) ×3 IMPLANT
HEMOSTAT SURGICEL 2X4 FIBR (HEMOSTASIS) ×3 IMPLANT
IPG PACE AZUR XT SR MRI W1SR01 (Pacemaker) ×1 IMPLANT
PACE AZURE XT SR MRI W1SR01 (Pacemaker) ×3 IMPLANT
PAD PRO RADIOLUCENT 2001M-C (PAD) ×3 IMPLANT
POUCH AIGIS-R ANTIBACT ICD (Mesh General) ×3 IMPLANT
TRAY PACEMAKER INSERTION (PACKS) ×3 IMPLANT

## 2018-09-21 NOTE — Discharge Instructions (Signed)
RESUME COUMADIN TOMORROW   Dermabond will come off in next 10-14 days. If not, will remove at wound check appt. Keep wound dry until tomorrow evening. No driving for 4 days.   Pacemaker Battery Change, Care After This sheet gives you information about how to care for yourself after your procedure. Your health care provider may also give you more specific instructions. If you have problems or questions, contact your health care provider. What can I expect after the procedure? After your procedure, it is common to have:  Pain or soreness at the site where the pacemaker was inserted.  Swelling at the site where the pacemaker was inserted. Follow these instructions at home: Incision care   Keep the incision clean and dry. ? Do not take baths, swim, or use a hot tub until your health care provider approves. ? You may shower the day after your procedure, or as directed by your health care provider. ? Pat the area dry with a clean towel. Do not rub the area. This may cause bleeding.  Follow instructions from your health care provider about how to take care of your incision. Make sure you: ? Wash your hands with soap and water before you change your bandage (dressing). If soap and water are not available, use hand sanitizer. ? Change your dressing as told by your health care provider. ? Leave stitches (sutures), skin glue, or adhesive strips in place. These skin closures may need to stay in place for 2 weeks or longer. If adhesive strip edges start to loosen and curl up, you may trim the loose edges. Do not remove adhesive strips completely unless your health care provider tells you to do that.  Check your incision area every day for signs of infection. Check for: ? More redness, swelling, or pain. ? More fluid or blood. ? Warmth. ? Pus or a bad smell. Activity  Do not lift anything that is heavier than 10 lb (4.5 kg) until your health care provider says it is okay to do so.  For the  first 2 weeks, or as long as told by your health care provider: ? Avoid lifting your left arm higher than your shoulder. ? Be gentle when you move your arms over your head. It is okay to raise your arm to comb your hair. ? Avoid strenuous exercise.  Ask your health care provider when it is okay to: ? Resume your normal activities. ? Return to work or school. ? Resume sexual activity. Eating and drinking  Eat a heart-healthy diet. This should include plenty of fresh fruits and vegetables, whole grains, low-fat dairy products, and lean protein like chicken and fish.  Limit alcohol intake to no more than 1 drink a day for non-pregnant women and 2 drinks a day for men. One drink equals 12 oz of beer, 5 oz of wine, or 1 oz of hard liquor.  Check ingredients and nutrition facts on packaged foods and beverages. Avoid the following types of food: ? Food that is high in salt (sodium). ? Food that is high in saturated fat, like full-fat dairy or red meat. ? Food that is high in trans fat, like fried food. ? Food and drinks that are high in sugar. Lifestyle  Do not use any products that contain nicotine or tobacco, such as cigarettes and e-cigarettes. If you need help quitting, ask your health care provider.  Take steps to manage and control your weight.  Get regular exercise. Aim for 150 minutes of  moderate-intensity exercise (such as walking or yoga) or 75 minutes of vigorous exercise (such as running or swimming) each week.  Manage other health problems, such as diabetes or high blood pressure. Ask your health care provider how you can manage these conditions. General instructions  Do not drive for 24 hours after your procedure if you were given a medicine to help you relax (sedative).  Take over-the-counter and prescription medicines only as told by your health care provider.  Avoid putting pressure on the area where the pacemaker was placed.  If you need an MRI after your pacemaker  has been placed, be sure to tell the health care provider who orders the MRI that you have a pacemaker.  Avoid close and prolonged exposure to electrical devices that have strong magnetic fields. These include: ? Cell phones. Avoid keeping them in a pocket near the pacemaker, and try using the ear opposite the pacemaker. ? MP3 players. ? Household appliances, like microwaves. ? Metal detectors. ? Electric generators. ? High-tension wires.  Keep all follow-up visits as directed by your health care provider. This is important. Contact a health care provider if:  You have pain at the incision site that is not relieved by over-the-counter or prescription medicines.  You have any of these around your incision site or coming from it: ? More redness, swelling, or pain. ? Fluid or blood. ? Warmth to the touch. ? Pus or a bad smell.  You have a fever.  You feel brief, occasional palpitations, light-headedness, or any symptoms that you think might be related to your heart. Get help right away if:  You experience chest pain that is different from the pain at the pacemaker site.  You develop a red streak that extends above or below the incision site.  You experience shortness of breath.  You have palpitations or an irregular heartbeat.  You have light-headedness that does not go away quickly.  You faint or have dizzy spells.  Your pulse suddenly drops or increases rapidly and does not return to normal.  You begin to gain weight and your legs and ankles swell. Summary  After your procedure, it is common to have pain, soreness, and some swelling where the pacemaker was inserted.  Make sure to keep your incision clean and dry. Follow instructions from your health care provider about how to take care of your incision.  Check your incision every day for signs of infection, such as more pain or swelling, pus or a bad smell, warmth, or leaking fluid and blood.  Avoid strenuous exercise  and lifting your left arm higher than your shoulder for 2 weeks, or as long as told by your health care provider. This information is not intended to replace advice given to you by your health care provider. Make sure you discuss any questions you have with your health care provider. Document Released: 07/03/2013 Document Revised: 08/04/2016 Document Reviewed: 08/04/2016 Elsevier Interactive Patient Education  2019 [employer]

## 2018-09-21 NOTE — H&P (Signed)
Patient Care Team: [name], MD as PCP - General (Family Medicine) Antonieta Iba, MD as Consulting Physician (Cardiology) Duke Salvia, MD as Consulting Physician (Cardiology) Iran Ouch, MD as Consulting Physician (Cardiology)   HPI  Rhonda Griffith is a 72 y.o. female for device generator replacement originally implanted 2000 -- Gen rep 2009 with RV lead revision,  Device at [facility]  History of mitral  valve disease status post bioprosthetic mitral valve replacement.    DATE TEST EF   1/16    Echo  69 % Moderate LAE   4/18    Echo  60 % LVH/ Severe LAE 52/2.6/73-- nl MV function          Intercurrent hx notable for stroke and was changed from apixoban to Rivaroxaban at [facility]   Now on coumadin       Date Cr K Mg Hgb  12/16 1.27 3.4  11.9  6/18 1.0 3.3 1.6    12/18 1.2 4.3 2.1   11/19 1.57 4.2  13.9   Chronic gait instabiitiy No edema   No chest pain   SOB stable  Records and Results Reviewed   Past Medical History:  Diagnosis Date  . Atrial fibrillation, permanent    a. CHA2DS2VASc = 4-->coumadin;  b. 09/2014 Echo: EF 55-60%, normal wall motion, mild AI/AS, nl MV, mod dil LA, mildly dil RA, mild to mod TR, PASP .  [name] Chronic kidney disease, stage I   . CVA (cerebral infarction)    a. 04/2015 - anticoagulation switched from eliquis to xarelto to coumadin.  . Leukocytosis   . Pacemaker -Medtronic    a. implant for SSS  . PAD (peripheral artery disease) (HCC)    a. w/ left lower ext claudication s/p ABI's 04/2015 showing nl ABI on Right with abnl waveforms on left sugg of L SFA dzs.  [name] Restless leg syndrome   . 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    Past Surgical History:  Procedure Laterality Date  . bladder injection    . botox bladder    . INSERT / REPLACE / REMOVE PACEMAKER     implant for SSS  . MELANOMA EXCISION     face    . MITRAL VALVE REPLACEMENT     s/p mechanical valve replaced with bovine valve 2010  . NASAL SINUS SURGERY      Current Facility-Administered Medications  Medication Dose Route Frequency Provider Last Rate Last Dose  . 0.9 %  sodium chloride infusion   Intravenous Continuous Duke Salvia, MD 50 mL/hr at 09/21/18 1001    . chlorhexidine (HIBICLENS) 4 % liquid 4 application  60 mL Topical Once Duke Salvia, MD      . gentamicin (GARAMYCIN) 80 mg in sodium chloride 0.9 % 500 mL irrigation  80 mg Irrigation On Call Duke Salvia, MD      . mupirocin ointment (BACTROBAN) 2 %           . vancomycin (VANCOCIN) IVPB 1000 mg/200 mL premix  1,000 mg Intravenous On Call Duke Salvia, MD        Allergies  Allergen Reactions  . Morphine Nausea And Vomiting  . Other Other (See Comments)    Several different statins.  . Statins Other (See Comments)    Several different statins.  . Tizanidine Palpitations  . Tape Other (See Comments)  Breaks her skin Allergic to plastic/latex tape. Only use paper tape on patient.  . Tapentadol Other (See Comments)    Allergic to plastic/latex tape. Only use paper tape on patient.  . Cephalexin Rash  . Latex Rash  . Sulfa Antibiotics Rash and Other (See Comments)    Allergic to plastic/latex tape. Only use paper tape on patient. Allergic to plastic/latex tape. Only use paper tape on patient.      Social History   Tobacco Use  . Smoking status: Former Smoker    Packs/day: 1.00    Years: 5.00    Pack years: 5.00    Types: Cigarettes    Last attempt to quit: 12/29/1970    Years since quitting: 47.7  . Smokeless tobacco: Never Used  Substance Use Topics  . Alcohol use: No  . Drug use: No     Family History  Family history unknown: Yes     Current Meds  Medication Sig  . ascorbic acid (VITAMIN C) 500 MG tablet Take 500 mg by mouth daily.  [name]. atorvastatin (LIPITOR) 80 MG tablet Take 80 mg by mouth every evening.   . Cholecalciferol  (VITAMIN D3) 1000 UNITS CAPS Take 2,000 Units by mouth every 30 (thirty) days.   . cyclobenzaprine (FLEXERIL) 5 MG tablet Take 5 mg by mouth at bedtime.  . diazepam (VALIUM) 5 MG tablet Take 5 mg by mouth every 12 (twelve) hours as needed for anxiety.  . diclofenac sodium (VOLTAREN) 1 % GEL Apply 2-4 g topically 2 (two) times daily as needed (for knee pain.).   [name]. diltiazem (CARDIZEM CD) 120 MG 24 hr capsule Take 1 capsule (120 mg total) by mouth daily.  . DULoxetine (CYMBALTA) 60 MG capsule Take 60 mg by mouth at bedtime.   . fluticasone (FLONASE) 50 MCG/ACT nasal spray Place 2 sprays into both nostrils daily.   . furosemide (LASIX) 20 MG tablet Take 20 mg by mouth daily as needed (fluid retention/swelling.).   [name]. gabapentin (NEURONTIN) 100 MG capsule Take 100 mg by mouth See admin instructions. Take 1 capsule (100MG ) by mouth every morning and 2 capsules (200MG ) by mouth every night  . magnesium oxide (MAG-OX) 400 MG tablet Take 400 mg by mouth 2 (two) times daily.   . mirabegron ER (MYRBETRIQ) 50 MG TB24 tablet Take 50 mg by mouth daily.  [name]. oxyCODONE-acetaminophen (PERCOCET) 5-325 MG tablet Take 0.5 tablets by mouth every 8 (eight) hours as needed for severe pain.  . pantoprazole (PROTONIX) 40 MG tablet Take 40 mg by mouth daily.   . potassium chloride (K-DUR) 10 MEQ tablet Take 1 tablet (10 mEq total) by mouth daily as needed. Take with lasix (Patient taking differently: Take 10 mEq by mouth daily as needed (with lasix (fluid retention/swelling)). )  . rOPINIRole (REQUIP) 2 MG tablet Take 2 mg by mouth at bedtime.   . solifenacin (VESICARE) 10 MG tablet Take 10 mg by mouth daily.   . traMADol (ULTRAM) 50 MG tablet Take 50-100 mg by mouth every 6 (six) hours as needed (for pain.).  [name]. warfarin (COUMADIN) 2 MG tablet Take 2.5 mg by mouth every evening. Patient is taking 1.25 tablet     Review of Systems negative except from HPI and PMH  Physical Exam BP 99/80   Pulse 66   Temp 97.6 F (36.4  C) (Oral)   Ht 5\' 9"  (1.753 m)   Wt 74.8 kg   SpO2 98%   BMI 24.37 kg/m  Well developed and nourished  in no acute distress HENT normal Neck supple with JVP-flat Clear Regular rate and rhythm, no murmurs or gallops Abd-soft with active BS No Clubbing cyanosis edema Skin-warm and dry A & Oriented  Grossly normal sensory and motor function   Assessment and plan  Atrial fibrillation -permanent  Bradycardia  Pacemaker-Medtronic with lead replacement  Device at [facility]   History of mitral valve replacement, mechanical followed by bioprosthetic most recently 2010  Stroke  Hypertension      We have reviewed the benefits and risks of generator replacement.  These include but are not limited to lead fracture and infection.  The patient understands, agrees and is willing to proceed.     Will use aegis pouch as 3rd procedure

## 2018-09-24 ENCOUNTER — Encounter (HOSPITAL_COMMUNITY): Payer: Self-pay | Admitting: Internal Medicine

## 2018-09-24 MED FILL — Lidocaine HCl Local Inj 1%: INTRAMUSCULAR | Qty: 60 | Status: AC

## 2018-09-25 LAB — CUP PACEART REMOTE DEVICE CHECK
Battery Impedance: 10984 Ohm
Battery Voltage: 2.2 V
Brady Statistic RV Percent Paced: 81 %
Implantable Pulse Generator Implant Date: 20090317
Lead Channel Impedance Value: 0 Ohm
Lead Channel Impedance Value: 489 Ohm
Lead Channel Setting Pacing Amplitude: 5 V
Lead Channel Setting Pacing Pulse Width: 1 ms
Lead Channel Setting Sensing Sensitivity: 2.8 mV
MDC IDC SESS DTM: 20191031171056

## 2018-09-28 ENCOUNTER — Telehealth: Payer: Self-pay | Admitting: Internal Medicine

## 2018-09-28 NOTE — Telephone Encounter (Signed)
Returned call, Marchelle Folks states HH is going to check pt on 10/01/18 at home.  [facility] follow's pt, she is not a pt in our Coumadin Clinic.

## 2018-09-28 NOTE — Telephone Encounter (Signed)
Amanda home nurse, called wanting know to when [place] comes in on 1/6 for her wound check if her INR can be checked and results sent to them.  She can be reached at [phone] option 2 or her direct line [phone].

## 2018-10-01 ENCOUNTER — Ambulatory Visit (INDEPENDENT_AMBULATORY_CARE_PROVIDER_SITE_OTHER): Payer: Medicare Other | Admitting: Nurse Practitioner

## 2018-10-01 DIAGNOSIS — I4821 Permanent atrial fibrillation: Secondary | ICD-10-CM | POA: Diagnosis not present

## 2018-10-01 NOTE — Progress Notes (Signed)
Wound check appointment. Steri-strips removed. Wound without redness or edema. Incision edges approximated, wound well healed. Normal device function. Thresholds, sensing, and impedances consistent with implant measurements.  Histogram distribution appropriate for patient and level of activity. No mode switches or high ventricular rates noted. Patient educated about wound care, arm mobility, lifting restrictions. ROV in 3 months with implanting physician. 

## 2018-10-16 ENCOUNTER — Telehealth: Payer: Self-pay | Admitting: Internal Medicine

## 2018-10-16 NOTE — Telephone Encounter (Signed)
Patient daughter calling  States that the [employer] is giving a bad signal  Called the number on machine and they state the carelink network needs to be enrolled  Please call to discuss

## 2018-10-16 NOTE — Telephone Encounter (Signed)
Spoke with Lupita Leash, able to troubleshoot monitor. She agrees to keep monitor plugged in within 57ft of where pt sleeps so that monitor will work automatically from this point on. Advised lights on monitor will dim when room is darkened. Lupita Leash verbalizes understanding, denies questions or concerns at this time. She is aware of pt's next OV with Dr. Graciela Husbands on 11/27/18.

## 2018-11-05 ENCOUNTER — Ambulatory Visit (INDEPENDENT_AMBULATORY_CARE_PROVIDER_SITE_OTHER): Payer: Medicare Other | Admitting: Podiatry

## 2018-11-05 ENCOUNTER — Encounter: Payer: Self-pay | Admitting: Podiatry

## 2018-11-05 DIAGNOSIS — B351 Tinea unguium: Secondary | ICD-10-CM | POA: Diagnosis not present

## 2018-11-05 DIAGNOSIS — M79676 Pain in unspecified toe(s): Secondary | ICD-10-CM

## 2018-11-05 DIAGNOSIS — Q828 Other specified congenital malformations of skin: Secondary | ICD-10-CM | POA: Diagnosis not present

## 2018-11-05 NOTE — Progress Notes (Signed)
Complaint:  Visit Type: Patient returns to my office for continued preventative foot care services. Complaint: Patient states" my nails have grown long and thick and become painful to walk and wear shoes" Patient also has painful callus on both feet. The patient presents for preventative foot care services. No changes to ROS.  She says she has had medical problems since her last visit.  Podiatric Exam: Vascular: dorsalis pedis and posterior tibial pulses are palpable right . Dorsalis pedis and posterior tibial pulses are not palpable.. Capillary return is immediate. Temperature gradient is WNL. Skin turgor WNL  Sensorium: Normal Semmes Weinstein monofilament test. Normal tactile sensation bilaterally. Nail Exam: Pt has thick disfigured discolored nails with subungual debris noted bilateral entire nail hallux through fifth toenails Ulcer Exam: There is no evidence of ulcer or pre-ulcerative changes or infection. Orthopedic Exam: Muscle tone and strength are WNL. No limitations in general ROM. No crepitus or effusions noted. Foot type and digits show no abnormalities. Bony prominences are unremarkable. Plantarflexed fifth metatarsals  B/L Hammer toes 2-4  B/l.Significant muscle weakness left foot.  Skin:  Porokeratosis sub 5th met  B/l. No infection or ulcers.  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 The patient understood the discussion of treatment and procedures well. All questions were answered thoroughly reviewed. Debridement of mycotic and hypertrophic toenails, 1 through 5 bilateral and clearing of subungual debris. Debridement of porokeratosis.  RTC 3 months for nail care. Return Visit-Office Procedure: Patient instructed to return to the office for a follow up visit 3 months for continued evaluation and treatment.    Gardiner Barefoot  DPM

## 2018-11-26 NOTE — Progress Notes (Incomplete)
skf      Patient Care Team: Shapely-Quinn, Desiree Lucy, MD as PCP - General (Family Medicine) Antonieta Iba, MD as Consulting Physician (Cardiology) Duke Salvia, MD as Consulting Physician (Cardiology) Iran Ouch, MD as Consulting Physician (Cardiology)   HPI  Rhonda Griffith is a 73 y.o. female Former patient of Dr. Leonia Reeves who underwent pacemaker implantation 2000 for symptomatic bradycardia in the context of atrial fibrillation. Gen rep 3/09 with RV lead revision  She has reached ERI  History of mitral  valve disease status post bioprosthetic mitral valve replacement.    DATE TEST EF   1/16    Echo  69 % Moderate LAE   4/18    Echo  60 % LVH/ Severe LAE 52/2.6/73-- nl MV function          Intercurrent hx notable for stroke and was changed from apixoban to Rivaroxaban at [facility]   Now on coumadin  NO bleeding  She is semiambulatory.  She uses a walker.  She is at home.    {She walks around without dyspnea or chest pain; mild edema.   Has had fall and foot fracture.  Also seen in ER for UTI and weakness}  Date Cr K Mg Hgb  12/16 1.27 3.4  11.9  6/18 1.0 3.3 1.6    12/18 1.2 4.3 2.1   11/19 1.57 4.2  13.9  12/19 1.20 4.5  11   ***     Past Medical History:  Diagnosis Date   Atrial fibrillation, permanent    a. CHA2DS2VASc = 4-->coumadin;  b. 09/2014 Echo: EF 55-60%, normal wall motion, mild AI/AS, nl MV, mod dil LA, mildly dil RA, mild to mod TR, PASP .   Chronic kidney disease, stage I    CVA (cerebral infarction)    a. 04/2015 - anticoagulation switched from eliquis to xarelto to coumadin.   Leukocytosis    Pacemaker -Medtronic    a. implant for SSS   PAD (peripheral artery disease) (HCC)    a. w/ left lower ext claudication s/p ABI's 04/2015 showing nl ABI on Right with abnl waveforms on left sugg of L SFA dzs.   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 s/p mechanical valve replaced with  bovine valve 2010.    Past Surgical History:  Procedure Laterality Date   bladder injection     botox bladder     INSERT / REPLACE / REMOVE PACEMAKER     implant for SSS   MELANOMA EXCISION     face   MITRAL VALVE REPLACEMENT     s/p mechanical valve replaced with bovine valve 2010   NASAL SINUS SURGERY     PPM GENERATOR CHANGEOUT N/A 09/21/2018   Procedure: PPM GENERATOR CHANGEOUT;  Surgeon: Duke Salvia, MD;  Location: [facility] INVASIVE CV LAB;  Service: Cardiovascular;  Laterality: N/A;    Current Outpatient Medications  Medication Sig Dispense Refill   albuterol (ACCUNEB) 1.25 MG/3ML nebulizer solution Take 1 ampule by nebulization every 6 (six) hours as needed for wheezing.     albuterol (PROVENTIL HFA;VENTOLIN HFA) 108 (90 BASE) MCG/ACT inhaler Inhale 2 puffs into the lungs every 6 (six) hours as needed for wheezing.     ascorbic acid (VITAMIN C) 500 MG tablet Take 500 mg by mouth daily.     atorvastatin (LIPITOR) 80 MG tablet Take 80 mg by mouth every evening.      Cholecalciferol (VITAMIN  D3) 1000 UNITS CAPS Take 2,000 Units by mouth every 30 (thirty) days.      cyclobenzaprine (FLEXERIL) 5 MG tablet Take 5 mg by mouth at bedtime.     diazepam (VALIUM) 5 MG tablet Take 5 mg by mouth every 12 (twelve) hours as needed for anxiety.     diclofenac sodium (VOLTAREN) 1 % GEL Apply 2-4 g topically 2 (two) times daily as needed (for knee pain.).      diltiazem (CARDIZEM CD) 120 MG 24 hr capsule Take 1 capsule (120 mg total) by mouth daily. 90 capsule 3   DULoxetine (CYMBALTA) 60 MG capsule Take 60 mg by mouth at bedtime.      fluticasone (FLONASE) 50 MCG/ACT nasal spray Place 2 sprays into both nostrils daily.      furosemide (LASIX) 20 MG tablet Take 20 mg by mouth daily as needed (fluid retention/swelling.).      gabapentin (NEURONTIN) 100 MG capsule Take 100 mg by mouth See admin instructions. Take 1 capsule (100MG ) by mouth every morning and 2 capsules (200MG ) by  mouth every night  5   guaiFENesin (MUCINEX) 600 MG 12 hr tablet Take 600 mg by mouth 2 (two) times daily as needed (cough/congestion.).     ipratropium (ATROVENT) 0.06 % nasal spray Place 2 sprays into both nostrils 2 (two) times daily. (Patient not taking: Reported on 09/12/2018) 15 mL 12   magnesium oxide (MAG-OX) 400 MG tablet Take 400 mg by mouth 2 (two) times daily.      mirabegron ER (MYRBETRIQ) 50 MG TB24 tablet Take 50 mg by mouth daily.     oxyCODONE-acetaminophen (PERCOCET) 5-325 MG tablet Take 0.5 tablets by mouth every 8 (eight) hours as needed for severe pain. 6 tablet 0   pantoprazole (PROTONIX) 40 MG tablet Take 40 mg by mouth daily.      potassium chloride (K-DUR) 10 MEQ tablet Take 1 tablet (10 mEq total) by mouth daily as needed. Take with lasix (Patient taking differently: Take 10 mEq by mouth daily as needed (with lasix (fluid retention/swelling)). ) 90 tablet 3   rOPINIRole (REQUIP) 2 MG tablet Take 2 mg by mouth at bedtime.      solifenacin (VESICARE) 10 MG tablet Take 10 mg by mouth daily.      traMADol (ULTRAM) 50 MG tablet Take 50-100 mg by mouth every 6 (six) hours as needed (for pain.).     warfarin (COUMADIN) 2 MG tablet Take 2.5 mg by mouth every evening. Patient is taking 1.25 tablet     No current facility-administered medications for this visit.     Allergies  Allergen Reactions   Morphine Nausea And Vomiting   Other Other (See Comments)    Several different statins.   Statins Other (See Comments)    Several different statins.   Tizanidine Palpitations   Tape Other (See Comments)    Breaks her skin Allergic to plastic/latex tape. Only use paper tape on patient.   Tapentadol Other (See Comments)    Allergic to plastic/latex tape. Only use paper tape on patient.   Cephalexin Rash   Latex Rash   Sulfa Antibiotics Rash and Other (See Comments)    Allergic to plastic/latex tape. Only use paper tape on patient. Allergic to plastic/latex  tape. Only use paper tape on patient.    Review of Systems negative except from HPI and PMH  Physical Exam There were no vitals taken for this visit. *** {Well developed and nourished in no acute distress HENT normal  Neck supple with JVP-flat Clear Device pocket well healed; without hematoma or erythema.  There is no tethering  Regular rate and rhythm, no murmurs or gallops Abd-soft with active BS No Clubbing cyanosis tr edema Skin-warm and dry A & Oriented  Walks w walker}    ECG *** {afib with Vpacing @ 65}   Assessment and plan  Atrial fibrillation -permanent  Bradycardia  Pacemaker-Medtronic with lead replacement  Device at ERI   History of mitral valve replacement, mechanical followed by bioprosthetic most recently 2010  Stroke  Hypertension    {BP well controlled  On Anticoagulation;  No bleeding issues   Device at [facility] With 3 procedure will anticipate AEGIS pouch.  With prior stroke, will do on warfarin, having her take 1/2 dose 3 and 2 nights before the procedure, and normal dose the night before the procedure}   I, Diona Browner am acting as a scribe for Sherryl Manges, M.D. {Add scribe attestation statement}  Signed, Sherryl Manges, MD 11/26/18 [facility], [place] [phone]

## 2018-11-27 ENCOUNTER — Encounter: Payer: Medicare Other | Admitting: Internal Medicine

## 2018-12-11 ENCOUNTER — Telehealth: Payer: Self-pay | Admitting: Internal Medicine

## 2018-12-11 NOTE — Telephone Encounter (Signed)
Reviewed with Dr. Graciela Husbands- ok to r/s out 3 months if the patient is stable.  I spoke with Lupita Leash. The patient is doing well at this time. She has her transmitter box hooked up.  I advised we can r/s out 3 months with Dr. Graciela Husbands and they can certainly call in the interim if needed. We can always have the patient transmit first for evaluation.   Lupita Leash voices understanding and is agreeable. We will r/s to 02/26/19 at 11:30 am with Dr. Graciela Husbands.

## 2018-12-11 NOTE — Telephone Encounter (Signed)
Patient daughter would like to know if upcoming fu is essential now or if Graciela Husbands is ok with her waiting to be seen due to risk of illness from COVID 19  Please call.

## 2018-12-18 ENCOUNTER — Encounter: Payer: Medicare Other | Admitting: Internal Medicine

## 2019-01-01 ENCOUNTER — Ambulatory Visit (INDEPENDENT_AMBULATORY_CARE_PROVIDER_SITE_OTHER): Payer: Medicare Other | Admitting: *Deleted

## 2019-01-01 ENCOUNTER — Other Ambulatory Visit: Payer: Self-pay

## 2019-01-01 DIAGNOSIS — R001 Bradycardia, unspecified: Secondary | ICD-10-CM | POA: Diagnosis not present

## 2019-01-01 DIAGNOSIS — I4821 Permanent atrial fibrillation: Secondary | ICD-10-CM

## 2019-01-01 LAB — CUP PACEART REMOTE DEVICE CHECK
Battery Remaining Longevity: 144 mo
Battery Voltage: 3.17 V
Brady Statistic RV Percent Paced: 94.37 %
Date Time Interrogation Session: 20200407173038
Implantable Lead Implant Date: 20090317
Implantable Lead Location: 753860
Implantable Lead Model: 5076
Implantable Pulse Generator Implant Date: 20191227
Lead Channel Impedance Value: 361 Ohm
Lead Channel Impedance Value: 418 Ohm
Lead Channel Pacing Threshold Amplitude: 0.75 V
Lead Channel Pacing Threshold Pulse Width: 0.4 ms
Lead Channel Sensing Intrinsic Amplitude: 5.875 mV
Lead Channel Sensing Intrinsic Amplitude: 5.875 mV
Lead Channel Setting Pacing Amplitude: 2.5 V
Lead Channel Setting Pacing Pulse Width: 0.4 ms
Lead Channel Setting Sensing Sensitivity: 1.2 mV

## 2019-01-09 NOTE — Progress Notes (Signed)
Remote pacemaker transmission.   

## 2019-02-04 ENCOUNTER — Ambulatory Visit: Payer: Medicare Other | Admitting: Podiatry

## 2019-02-21 ENCOUNTER — Ambulatory Visit: Payer: Medicare Other | Admitting: Podiatry

## 2019-02-21 ENCOUNTER — Other Ambulatory Visit: Payer: Self-pay

## 2019-02-21 ENCOUNTER — Encounter: Payer: Self-pay | Admitting: Podiatry

## 2019-02-21 ENCOUNTER — Ambulatory Visit (INDEPENDENT_AMBULATORY_CARE_PROVIDER_SITE_OTHER): Payer: Medicare Other | Admitting: Podiatry

## 2019-02-21 VITALS — Temp 98.6°F

## 2019-02-21 DIAGNOSIS — B351 Tinea unguium: Secondary | ICD-10-CM

## 2019-02-21 DIAGNOSIS — M5136 Other intervertebral disc degeneration, lumbar region: Secondary | ICD-10-CM | POA: Insufficient documentation

## 2019-02-21 DIAGNOSIS — Q828 Other specified congenital malformations of skin: Secondary | ICD-10-CM | POA: Diagnosis not present

## 2019-02-21 DIAGNOSIS — L84 Corns and callosities: Secondary | ICD-10-CM

## 2019-02-21 DIAGNOSIS — M48061 Spinal stenosis, lumbar region without neurogenic claudication: Secondary | ICD-10-CM | POA: Insufficient documentation

## 2019-02-21 DIAGNOSIS — M79676 Pain in unspecified toe(s): Secondary | ICD-10-CM

## 2019-02-21 DIAGNOSIS — M5416 Radiculopathy, lumbar region: Secondary | ICD-10-CM | POA: Insufficient documentation

## 2019-02-21 NOTE — Progress Notes (Signed)
Complaint:  Visit Type: Patient returns to my office for continued preventative foot care services. Complaint: Patient states" my nails have grown long and thick and become painful to walk and wear shoes" Patient also has painful callus on both feet. The patient presents for preventative foot care services. No changes to ROS.  She says she has had medical problems since her last visit.  Podiatric Exam: Vascular: dorsalis pedis and posterior tibial pulses are palpable right . Dorsalis pedis and posterior tibial pulses are not palpable.. Capillary return is immediate. Temperature gradient is WNL. Skin turgor WNL  Sensorium: Normal Semmes Weinstein monofilament test. Normal tactile sensation bilaterally. Nail Exam: Pt has thick disfigured discolored nails with subungual debris noted bilateral entire nail hallux through fifth toenails Ulcer Exam: There is no evidence of ulcer or pre-ulcerative changes or infection. Orthopedic Exam: Muscle tone and strength are WNL. No limitations in general ROM. No crepitus or effusions noted. Foot type and digits show no abnormalities. Bony prominences are unremarkable. Plantarflexed fifth metatarsals  B/L Hammer toes 2-4  B/l.Significant muscle weakness left foot.  Skin:  Porokeratosis sub 5th met  B/l. No infection or ulcers.  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 The patient understood the discussion of treatment and procedures well. All questions were answered thoroughly reviewed. Debridement of mycotic and hypertrophic toenails, 1 through 5 bilateral and clearing of subungual debris. Debridement of porokeratosis.  RTC 3 months for nail care. Return Visit-Office Procedure: Patient instructed to return to the office for a follow up visit 3 months for continued evaluation and treatment.    Gardiner Barefoot  DPM

## 2019-02-26 ENCOUNTER — Telehealth (INDEPENDENT_AMBULATORY_CARE_PROVIDER_SITE_OTHER): Payer: Medicare Other | Admitting: Internal Medicine

## 2019-02-26 ENCOUNTER — Other Ambulatory Visit: Payer: Self-pay

## 2019-02-26 VITALS — Ht 69.0 in | Wt 160.0 lb

## 2019-02-26 DIAGNOSIS — I4821 Permanent atrial fibrillation: Secondary | ICD-10-CM | POA: Diagnosis not present

## 2019-02-26 DIAGNOSIS — Z79899 Other long term (current) drug therapy: Secondary | ICD-10-CM

## 2019-02-26 NOTE — Progress Notes (Signed)
Electrophysiology TeleHealth Note   Due to national recommendations of social distancing due to COVID 19, an audio/video telehealth visit is felt to be most appropriate for this patient at this time.  See MyChart message from today for the patient's consent to telehealth for [facility].   Date:  02/26/2019   ID:  Marykay Lex, DOB 04/09/1946, MRN [id number]  Location: patient's home  Provider location: [address] [place]  Evaluation Performed: Follow-up visit  PCP:  [name], MD  Cardiologist:    Electrophysiologist:  SK   Chief Complaint:  Atrial fib  History of Present Illness:    Rhonda Griffith is a 73 y.o. female who presents via audio/video conferencing for a telehealth visit today.  Since last being seen in our clinic  r prior to device replacement the patient reports doing very well.  Wound is healed nicely.  No shortness of breath or chest pain.  No peripheral edema.  Some chronic gait instability.  Echo EF 4/18 normal.  Permanent atrial fibrillation anticoagulated with warfarin no clinical bleeding.  Date Cr K Hgb  12/19 1.2 4.5 11.0<<13.9 (11/19)           The patient denies symptoms of fevers, chills, cough, or new SOB worrisome for COVID 19.   Past Medical History:  Diagnosis Date  . Atrial fibrillation, permanent    a. CHA2DS2VASc = 4-->coumadin;  b. 09/2014 Echo: EF 55-60%, normal wall motion, mild AI/AS, nl MV, mod dil LA, mildly dil RA, mild to mod TR, PASP .  [name] Chronic kidney disease, stage I   . CVA (cerebral infarction)    a. 04/2015 - anticoagulation switched from eliquis to xarelto to coumadin.  . Leukocytosis   . Pacemaker -Medtronic    a. implant for SSS  . PAD (peripheral artery disease) (HCC)    a. w/ left lower ext claudication s/p ABI's 04/2015 showing nl ABI on Right with abnl waveforms on left sugg of L SFA dzs.  [name] Restless leg syndrome   . Rheumatic mitral valve and  aortic valve stenosis    a. s/p mechanical valve replaced with bovine valve 2010.    Past Surgical History:  Procedure Laterality Date  . bladder injection    . botox bladder    . INSERT / REPLACE / REMOVE PACEMAKER     implant for SSS  . MELANOMA EXCISION     face  . MITRAL VALVE REPLACEMENT     s/p mechanical valve replaced with bovine valve 2010  . NASAL SINUS SURGERY    . PPM GENERATOR CHANGEOUT N/A 09/21/2018   Procedure: PPM GENERATOR CHANGEOUT;  Surgeon: Duke Salvia, MD;  Location: [facility] INVASIVE CV LAB;  Service: Cardiovascular;  Laterality: N/A;    Current Outpatient Medications  Medication Sig Dispense Refill  . albuterol (ACCUNEB) 1.25 MG/3ML nebulizer solution Take 1 ampule by nebulization every 6 (six) hours as needed for wheezing.    [name] albuterol (PROVENTIL HFA;VENTOLIN HFA) 108 (90 BASE) MCG/ACT inhaler Inhale 2 puffs into the lungs every 6 (six) hours as needed for wheezing.    [name] ascorbic acid (VITAMIN C) 500 MG tablet Take 500 mg by mouth daily.    [name] atorvastatin (LIPITOR) 80 MG tablet Take 80 mg by mouth every evening.     . Cholecalciferol (VITAMIN D3) 1000 UNITS CAPS Take 2,000 Units by mouth every 30 (thirty) days.     . cyclobenzaprine (FLEXERIL) 5 MG tablet Take 5 mg  by mouth at bedtime.    . diazepam (VALIUM) 5 MG tablet Take 5 mg by mouth every 12 (twelve) hours as needed for anxiety.    . diclofenac sodium (VOLTAREN) 1 % GEL Apply 2-4 g topically 2 (two) times daily as needed (for knee pain.).     [name] diltiazem (CARDIZEM CD) 120 MG 24 hr capsule Take 1 capsule (120 mg total) by mouth daily. 90 capsule 3  . DULoxetine (CYMBALTA) 60 MG capsule Take 60 mg by mouth at bedtime.     . fluticasone (FLONASE) 50 MCG/ACT nasal spray Place 2 sprays into both nostrils daily.     . furosemide (LASIX) 20 MG tablet Take 20 mg by mouth daily as needed (fluid retention/swelling.).     [name] gabapentin (NEURONTIN) 100 MG capsule Take 100 mg by mouth See admin instructions. Take 1  capsule (100MG ) by mouth every morning and 2 capsules (200MG ) by mouth every night  5  . magnesium oxide (MAG-OX) 400 MG tablet Take 400 mg by mouth 2 (two) times daily.     . mirabegron ER (MYRBETRIQ) 50 MG TB24 tablet Take 50 mg by mouth daily.    [name] oxyCODONE-acetaminophen (PERCOCET) 5-325 MG tablet Take 0.5 tablets by mouth every 8 (eight) hours as needed for severe pain. 6 tablet 0  . pantoprazole (PROTONIX) 40 MG tablet Take 40 mg by mouth daily.     . potassium chloride (K-DUR) 10 MEQ tablet Take 1 tablet (10 mEq total) by mouth daily as needed. Take with lasix (Patient taking differently: Take 10 mEq by mouth daily as needed (with lasix (fluid retention/swelling)). ) 90 tablet 3  . rOPINIRole (REQUIP) 2 MG tablet Take 2 mg by mouth at bedtime.     . solifenacin (VESICARE) 10 MG tablet Take 10 mg by mouth daily.     [name] warfarin (COUMADIN) 2 MG tablet Take 2.5 mg by mouth every evening. Patient is taking 1.25 tablet    . traMADol (ULTRAM) 50 MG tablet Take 50-100 mg by mouth every 6 (six) hours as needed (for pain.).     No current facility-administered medications for this visit.     Allergies:   Morphine; Other; Statins; Tizanidine; Tape; Tapentadol; Cephalexin; Latex; and Sulfa antibiotics   Social History:  The patient  reports that she quit smoking about 48 years ago. Her smoking use included cigarettes. She has a 5.00 pack-year smoking history. She has never used smokeless tobacco. She reports that she does not drink alcohol or use drugs.   Family History:  The patient's   Family history is unknown by patient.   ROS:  Please see the history of present illness.   All other systems are personally reviewed and negative.    Exam:    Vital Signs:  Ht 5\' 9"  (1.753 m)   Wt 160 lb (72.6 kg)   BMI 23.63 kg/m   *  Well appearing, alert and conversant, regular work of breathing,  good skin color Eyes- anicteric, neuro- grossly intact, skin- no apparent rash or lesions or cyanosis,  mouth- oral mucosa is pink Device pocket well healed; without hematoma or erythema.  There is no tethering  Labs/Other Tests and Data Reviewed:    Recent Labs: 05/31/2018: ALT 14 09/21/2018: BUN 13; Creatinine, Ser 1.20; Hemoglobin 11.0; Platelets 299; Potassium 4.5; Sodium 140   Wt Readings from Last 3 Encounters:  02/26/19 160 lb (72.6 kg)  09/21/18 165 lb (74.8 kg)  08/21/18 164 lb 4 oz (74.5 kg)  Other studies personally reviewed: Additional studies/ records that were reviewed today include:    Device interrogation most recently 4/20 demonstrated normal device function 95% ventricular pacing  ASSESSMENT & PLAN:    Atrial fibrillation -permanent  Bradycardia  Pacemaker-Medtronic with lead replacement     History of mitral valve replacement, mechanical followed by bioprosthetic most recently 2010  Stroke  Hypertension   On Anticoagulation;  No bleeding issues   Anemia evident at her last blood work.  No volume overload.  Feeling really good.   COVID 19 screen The patient denies symptoms of COVID 19 at this time.  The importance of social distancing was discussed today.  Follow-up: 9 months Remote follow-up scheduled for July    Current medicines are reviewed at length with the patient today.   The patient does not have concerns regarding her medicines.  The following changes were made today:  none  Labs/ tests ordered today include: CBC (failed to mention to the patient) No orders of the defined types were placed in this encounter.   Future tests ( post COVID )    months  Patient Risk:  after full review of this patients clinical status, I feel that they are at moderat risk at this time.  Today, I have spent 5 minutes with the patient with telehealth technology discussing the above.  Signed, Sherryl MangesSteven Klein, MD  02/26/2019 11:42 AM     [facility] [address] [place] [address] [phone] (office) [phone]  (fax)

## 2019-02-26 NOTE — Patient Instructions (Addendum)
Medication Instructions:  - Your physician recommends that you continue on your current medications as directed. Please refer to the Current Medication list given to you today.  If you need a refill on your cardiac medications before your next appointment, please call your pharmacy.   Lab work: - Your physician recommends you have lab work: The [employer] manager at [facility], 1st desk on the right to check in. You may go Monday-Friday (7:30 am-5 pm) on a walk-in basis.  If you have labs (blood work) drawn today and your tests are completely normal, you will receive your results only by: [name] MyChart Message (if you have MyChart) OR . A paper copy in the mail If you have any lab test that is abnormal or we need to change your treatment, we will call you to review the results.  Testing/Procedures: - none ordered  Follow-Up: At [facility], you and your health needs are our priority.  As part of our continuing mission to provide you with exceptional heart care, we have created designated Provider Care Teams.  These Care Teams include your primary Cardiologist (physician) and Advanced Practice Providers (APPs -  Physician Assistants and Nurse Practitioners) who all work together to provide you with the care you need, when you need it.  You will need a follow up appointment in 9 months (March 2021) with Dr. Graciela Husbands.   [name] Please call our office 2 months in advance to schedule this appointment.  (call in early January 2021 to schedule).   Remote monitoring is used to monitor your Pacemaker of ICD from home. This monitoring reduces the number of office visits required to check your device to one time per year. It allows [place] to keep an eye on the functioning of your device to ensure it is working properly. You are scheduled for a device check from home on 04/02/19. You may send your transmission at any time that day. If you have a wireless device, the transmission will be sent automatically. After your physician  reviews your transmission, you will receive a postcard with your next transmission date.   Any Other Special Instructions Will Be Listed Below (If Applicable). - N/A

## 2019-03-07 ENCOUNTER — Other Ambulatory Visit
Admission: RE | Admit: 2019-03-07 | Discharge: 2019-03-07 | Disposition: A | Discharge status: Home / Self Care | Payer: Medicare Other | Source: Ambulatory Visit | Attending: Internal Medicine | Admitting: Internal Medicine

## 2019-03-07 DIAGNOSIS — Z01812 Encounter for preprocedural laboratory examination: Secondary | ICD-10-CM | POA: Diagnosis present

## 2019-03-07 DIAGNOSIS — I495 Sick sinus syndrome: Secondary | ICD-10-CM

## 2019-03-07 DIAGNOSIS — Z79899 Other long term (current) drug therapy: Secondary | ICD-10-CM | POA: Diagnosis present

## 2019-03-07 DIAGNOSIS — I4821 Permanent atrial fibrillation: Secondary | ICD-10-CM

## 2019-03-07 LAB — BASIC METABOLIC PANEL
Anion gap: 7 (ref 5–15)
BUN: 28 mg/dL — ABNORMAL HIGH (ref 8–23)
CO2: 25 mmol/L (ref 22–32)
Calcium: 9.7 mg/dL (ref 8.9–10.3)
Chloride: 108 mmol/L (ref 98–111)
Creatinine, Ser: 1.02 mg/dL — ABNORMAL HIGH (ref 0.44–1.00)
GFR calc Af Amer: >60 mL/min (ref >60)
GFR calc non Af Amer: 54 mL/min — ABNORMAL LOW (ref >60)
Glucose, Bld: 109 mg/dL — ABNORMAL HIGH (ref 70–99)
Potassium: 3.9 mmol/L (ref 3.5–5.1)
Sodium: 140 mmol/L (ref 135–145)

## 2019-03-07 LAB — CBC WITH DIFFERENTIAL/PLATELET
Abs Immature Granulocytes: 0.03 10*3/uL (ref 0.00–0.07)
Basophils Absolute: 0 10*3/uL (ref 0.0–0.1)
Basophils Relative: 1 %
Eosinophils Absolute: 0.2 10*3/uL (ref 0.0–0.5)
Eosinophils Relative: 2 %
HCT: 36.9 % (ref 36.0–46.0)
Hemoglobin: 11.6 g/dL — ABNORMAL LOW (ref 12.0–15.0)
Immature Granulocytes: 0 %
Lymphocytes Relative: 16 %
Lymphs Abs: 1.2 10*3/uL (ref 0.7–4.0)
MCH: 30.6 pg (ref 26.0–34.0)
MCHC: 31.4 g/dL (ref 30.0–36.0)
MCV: 97.4 fL (ref 80.0–100.0)
Monocytes Absolute: 0.7 10*3/uL (ref 0.1–1.0)
Monocytes Relative: 9 %
Neutro Abs: 5.1 10*3/uL (ref 1.7–7.7)
Neutrophils Relative %: 72 %
Platelets: 236 10*3/uL (ref 150–400)
RBC: 3.79 MIL/uL — ABNORMAL LOW (ref 3.87–5.11)
RDW: 13.2 % (ref 11.5–15.5)
WBC: 7.2 10*3/uL (ref 4.0–10.5)
nRBC: 0 % (ref 0.0–0.2)

## 2019-03-07 LAB — PROTIME-INR
INR: 2.3 — ABNORMAL HIGH (ref 0.8–1.2)
Prothrombin Time: 25.1 seconds — ABNORMAL HIGH (ref 11.4–15.2)

## 2019-04-02 ENCOUNTER — Ambulatory Visit (INDEPENDENT_AMBULATORY_CARE_PROVIDER_SITE_OTHER): Payer: Medicare Other | Admitting: *Deleted

## 2019-04-02 DIAGNOSIS — I495 Sick sinus syndrome: Secondary | ICD-10-CM

## 2019-04-02 LAB — CUP PACEART REMOTE DEVICE CHECK
Battery Remaining Longevity: 140 mo
Battery Voltage: 3.12 V
Brady Statistic RV Percent Paced: 96.27 %
Date Time Interrogation Session: 20200707060152
Implantable Lead Implant Date: 20090317
Implantable Lead Location: 753860
Implantable Lead Model: 5076
Implantable Pulse Generator Implant Date: 20191227
Lead Channel Impedance Value: 361 Ohm
Lead Channel Impedance Value: 418 Ohm
Lead Channel Pacing Threshold Amplitude: 0.75 V
Lead Channel Pacing Threshold Pulse Width: 0.4 ms
Lead Channel Sensing Intrinsic Amplitude: 7 mV
Lead Channel Sensing Intrinsic Amplitude: 7 mV
Lead Channel Setting Pacing Amplitude: 2.5 V
Lead Channel Setting Pacing Pulse Width: 0.4 ms
Lead Channel Setting Sensing Sensitivity: 1.2 mV

## 2019-04-09 ENCOUNTER — Emergency Department
Admission: EM | Admit: 2019-04-09 | Discharge: 2019-04-09 | Disposition: A | Discharge status: Home / Self Care | Payer: Medicare Other | Source: Home / Self Care | Attending: Emergency Medicine | Admitting: Emergency Medicine

## 2019-04-09 ENCOUNTER — Emergency Department: Payer: Medicare Other

## 2019-04-09 ENCOUNTER — Encounter: Payer: Self-pay | Admitting: Emergency Medicine

## 2019-04-09 ENCOUNTER — Other Ambulatory Visit: Payer: Self-pay

## 2019-04-09 DIAGNOSIS — N189 Chronic kidney disease, unspecified: Secondary | ICD-10-CM | POA: Insufficient documentation

## 2019-04-09 DIAGNOSIS — M25551 Pain in right hip: Secondary | ICD-10-CM | POA: Insufficient documentation

## 2019-04-09 DIAGNOSIS — I129 Hypertensive chronic kidney disease with stage 1 through stage 4 chronic kidney disease, or unspecified chronic kidney disease: Secondary | ICD-10-CM | POA: Insufficient documentation

## 2019-04-09 DIAGNOSIS — Z7901 Long term (current) use of anticoagulants: Secondary | ICD-10-CM | POA: Insufficient documentation

## 2019-04-09 DIAGNOSIS — Z79899 Other long term (current) drug therapy: Secondary | ICD-10-CM | POA: Insufficient documentation

## 2019-04-09 DIAGNOSIS — S72001A Fracture of unspecified part of neck of right femur, initial encounter for closed fracture: Secondary | ICD-10-CM | POA: Diagnosis not present

## 2019-04-09 DIAGNOSIS — Z950 Presence of cardiac pacemaker: Secondary | ICD-10-CM | POA: Insufficient documentation

## 2019-04-09 DIAGNOSIS — Z85820 Personal history of malignant melanoma of skin: Secondary | ICD-10-CM | POA: Insufficient documentation

## 2019-04-09 DIAGNOSIS — Z01818 Encounter for other preprocedural examination: Secondary | ICD-10-CM

## 2019-04-09 DIAGNOSIS — Z8673 Personal history of transient ischemic attack (TIA), and cerebral infarction without residual deficits: Secondary | ICD-10-CM | POA: Insufficient documentation

## 2019-04-09 MED ORDER — DULCOLAX 5 MG PO TBEC: 5.0000 mg | DELAYED_RELEASE_TABLET | Freq: Every day | ORAL | 0 refills | Status: AC | PRN

## 2019-04-09 MED ORDER — OXYCODONE-ACETAMINOPHEN 5-325 MG PO TABS
1.0000 | ORAL_TABLET | Freq: Once | ORAL | Status: AC
  Administered 2019-04-09: 1 via ORAL
  Filled 2019-04-09: qty 1

## 2019-04-09 MED ORDER — OXYCODONE-ACETAMINOPHEN 5-325 MG PO TABS: 1.0000 | ORAL_TABLET | ORAL | 0 refills | Status: DC | PRN

## 2019-04-09 NOTE — ED Provider Notes (Signed)
[facility] Emergency Department Provider Note   ____________________________________________   I have reviewed the triage vital signs and the nursing notes.   HISTORY  Chief Complaint Hip Pain   History limited by: Not Limited   HPI [place] Kieth Brightlyleanor Blandon is a 73 y.o. female who presents to the emergency department today because of concern for right hip pain. The patient states that one week ago she had a fall and fell onto her right hip. The patient has been having pain since then. Located on the outside of her right hip. She has found it hurts worse when she tries to walk or sit down. She denies any numbness in her leg. Denies any recent fever, shortness of breath, chest pain.    Records reviewed. Per medical record review patient has a history of atrial fibrillation, on warfarin.   Past Medical History:  Diagnosis Date  . Atrial fibrillation, permanent    a. CHA2DS2VASc = 4-->coumadin;  b. 09/2014 Echo: EF 55-60%, normal wall motion, mild AI/AS, nl MV, mod dil LA, mildly dil RA, mild to mod TR, PASP 47mmHg.  [name]. Chronic kidney disease, stage I   . CVA (cerebral infarction)    a. 04/2015 - anticoagulation switched from eliquis to xarelto to coumadin.  . Leukocytosis   . Pacemaker -Medtronic    a. implant for SSS  . PAD (peripheral artery disease) (HCC)    a. w/ left lower ext claudication s/p ABI's 04/2015 showing nl ABI on Right with abnl waveforms on left sugg of L SFA dzs.  [name]. Restless leg syndrome   . 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    Patient Active Problem List   Diagnosis Date Noted  . Degeneration of lumbar intervertebral disc 02/21/2019  . Lumbar radiculopathy 02/21/2019  . Spinal stenosis of lumbar region 02/21/2019  . Cellulitis 08/30/2018  . Closed fracture of distal end of left fibula 06/28/2018  . Acute respiratory failure with hypoxia (HCC) 10/14/2016  .  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 Influenza A 10/14/2016  . Status post cardiac pacemaker procedure 10/14/2016  . Stroke (HCC) 10/14/2016  . ERRONEOUS ENCOUNTER--DISREGARD 07/03/2016  . Carotid bruit 12/22/2015  . Hyperlipidemia 10/23/2015  . Atrial fibrillation, permanent   . Pacemaker -Medtronic   . 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 Chronic kidney disease, unspecified   . Encounter for anticoagulation discussion and counseling 06/25/2015  . Acute cardioembolic stroke (HCC) 06/25/2015  . PAD (peripheral artery disease) (HCC) 06/25/2015  . Pure hypercholesterolemia 04/30/2015  . History of recent stroke 04/28/2015  . Essential hypertension 12/09/2014  . Gait instability 09/08/2014  . Status post total bilateral knee replacement 06/24/2014  . UTI (urinary tract infection) 02/03/2014  . Fatigue 10/01/2013  . Shortness of breath 09/12/2012  . Stress disorder, acute 09/12/2012  . Tachycardia 09/12/2012  . Atrial fibrillation (HCC) 09/12/2012  . Melanoma in situ (HCC) 07/11/2012  . Morphea 07/11/2012  . Long term (current) use of anticoagulants 07/02/2012  . History of malignant melanoma of skin 05/30/2012  . CMC arthritis 02/02/2012  . Sprain of MCL (medial collateral ligament) of knee 01/24/2012  . Squamous cell carcinoma 12/21/2011  . Carpal tunnel syndrome of left wrist 11/28/2011  . Sinoatrial node dysfunction (HCC)   . Movement disorder 08/08/2011  . Edema 06/07/2011  . Chronic diastolic heart failure (HCC) 05/27/2011  . Anxiety state 05/14/2011  . Esophageal reflux 05/14/2011  . Mitral  valve replaced 05/14/2011  . Myalgia and myositis, unspecified 05/14/2011  . OSA (obstructive sleep apnea) 05/14/2011  . Osteoarthritis 05/14/2011  . Pulmonary hypertension (HCC) 05/14/2011  . Cardiac pacemaker in situ 10/03/2010    Past Surgical History:  Procedure Laterality Date  . bladder injection    . botox bladder    . INSERT / REPLACE / REMOVE PACEMAKER     implant  for SSS  . MELANOMA EXCISION     face  . MITRAL VALVE REPLACEMENT     s/p mechanical valve replaced with bovine valve 2010  . NASAL SINUS SURGERY    . PPM GENERATOR CHANGEOUT N/A 09/21/2018   Procedure: PPM GENERATOR CHANGEOUT;  Surgeon: Duke SalviaKlein, Steven C, MD;  [facility];  Service: Cardiovascular;  Laterality: N/A;    Prior to Admission medications   Medication Sig Start Date End Date Taking? Authorizing Provider  albuterol (ACCUNEB) 1.25 MG/3ML nebulizer solution Take 1 ampule by nebulization every 6 (six) hours as needed for wheezing.    [provider]  albuterol (PROVENTIL HFA;VENTOLIN HFA) 108 (90 BASE) MCG/ACT inhaler Inhale 2 puffs into the lungs every 6 (six) hours as needed for wheezing.    [provider]  ascorbic acid (VITAMIN C) 500 MG tablet Take 500 mg by mouth daily.    [provider]  atorvastatin (LIPITOR) 80 MG tablet Take 80 mg by mouth every evening.     [provider]  Cholecalciferol (VITAMIN D3) 1000 UNITS CAPS Take 2,000 Units by mouth every 30 (thirty) days.     [provider]  cyclobenzaprine (FLEXERIL) 5 MG tablet Take 5 mg by mouth at bedtime. 08/24/18   [provider]  diazepam (VALIUM) 5 MG tablet Take 5 mg by mouth every 12 (twelve) hours as needed for anxiety.    [provider]  diclofenac sodium (VOLTAREN) 1 % GEL Apply 2-4 g topically 2 (two) times daily as needed (for knee pain.).     [provider]  diltiazem (CARDIZEM CD) 120 MG 24 hr capsule Take 1 capsule (120 mg total) by mouth daily. 09/06/16   Antonieta IbaGollan, Timothy J, MD  DULoxetine (CYMBALTA) 60 MG capsule Take 60 mg by mouth at bedtime.     [provider]  fluticasone (FLONASE) 50 MCG/ACT nasal spray Place 2 sprays into both nostrils daily.     [provider]  furosemide (LASIX) 20 MG tablet Take 20 mg by mouth daily as needed (fluid retention/swelling.).     [provider]   gabapentin (NEURONTIN) 100 MG capsule Take 100 mg by mouth See admin instructions. Take 1 capsule (100MG ) by mouth every morning and 2 capsules (200MG ) by mouth every night 03/17/18   [provider]  magnesium oxide (MAG-OX) 400 MG tablet Take 400 mg by mouth 2 (two) times daily.     [provider]  mirabegron ER (MYRBETRIQ) 50 MG TB24 tablet Take 50 mg by mouth daily. 09/02/16   [provider]  oxyCODONE-acetaminophen (PERCOCET) 5-325 MG tablet Take 0.5 tablets by mouth every 8 (eight) hours as needed for severe pain. 06/01/18 06/01/19  Sharyn CreamerQuale, Mark, MD  pantoprazole (PROTONIX) 40 MG tablet Take 40 mg by mouth daily.     [provider]  potassium chloride (K-DUR) 10 MEQ tablet Take 1 tablet (10 mEq total) by mouth daily as needed. Take with lasix Patient taking differently: Take 10 mEq by mouth daily as needed (with lasix (fluid retention/swelling)).  09/06/16   Julien NordmannGollan, Timothy  J, MD  rOPINIRole (REQUIP) 2 MG tablet Take 2 mg by mouth at bedtime.  05/29/15   [provider]  solifenacin (VESICARE) 10 MG tablet Take 10 mg by mouth daily.  04/03/18   [provider]  traMADol (ULTRAM) 50 MG tablet Take 50-100 mg by mouth every 6 (six) hours as needed (for pain.).    [provider]  warfarin (COUMADIN) 2 MG tablet Take 2.5 mg by mouth every evening. Patient is taking 1.25 tablet    [provider]    Allergies Morphine, Other, Statins, Tizanidine, Tape, Tapentadol, Cephalexin, Latex, and Sulfa antibiotics  Family History  Family history unknown: Yes    Social History Social History   Tobacco Use  . Smoking status: Former Smoker    Packs/day: 1.00    Years: 5.00    Pack years: 5.00    Types: Cigarettes    Quit date: 12/29/1970    Years since quitting: 48.3  . Smokeless tobacco: Never Used  Substance Use Topics  . Alcohol use: No  . Drug use: No    Review of Systems Constitutional: No fever/chills Eyes: No visual  changes. ENT: No sore throat. Cardiovascular: Denies chest pain. Respiratory: Denies shortness of breath. Gastrointestinal: No abdominal pain.  No nausea, no vomiting.  No diarrhea.   Genitourinary: Negative for dysuria. Musculoskeletal: Positive for right hip pain.  Skin: Negative for rash. Neurological: Negative for headaches, focal weakness or numbness.  ____________________________________________   PHYSICAL EXAM:  VITAL SIGNS: ED Triage Vitals [04/09/19 1500]  Enc Vitals Group     BP (!) 144/70     Pulse Rate 85     Resp 16     Temp 98.2 F (36.8 C)     Temp Source Oral     SpO2 98 %     Weight      Height      Head Circumference      Peak Flow      Pain Score 0   Constitutional: Alert and oriented.  Eyes: Conjunctivae are normal.  ENT      Head: Normocephalic and atraumatic.      Nose: No congestion/rhinnorhea.      Mouth/Throat: Mucous membranes are moist.      Neck: No stridor. Hematological/Lymphatic/Immunilogical: No cervical lymphadenopathy. Cardiovascular: Normal rate, regular rhythm.  No murmurs, rubs, or gallops.  Respiratory: Normal respiratory effort without tachypnea nor retractions. Breath sounds are clear and equal bilaterally. No wheezes/rales/rhonchi. Gastrointestinal: Soft and non tender. No rebound. No guarding.  Genitourinary: Deferred Musculoskeletal: Normal range of motion in all extremities. No lower extremity edema. Neurologic:  Normal speech and language. No gross focal neurologic deficits are appreciated.  Skin:  Skin is warm, dry and intact. No rash noted. Psychiatric: Mood and affect are normal. Speech and behavior are normal. Patient exhibits appropriate insight and judgment.  ____________________________________________    LABS (pertinent positives/negatives)  None  ____________________________________________   EKG  None  ____________________________________________    RADIOLOGY  Right hip No acute osseous  injury  CT right hip No fracture or dislocation ____________________________________________   PROCEDURES  Procedures  ____________________________________________   INITIAL IMPRESSION / ASSESSMENT AND PLAN / ED COURSE  Pertinent labs & imaging results that were available during my care of the patient were reviewed by me and considered in my medical decision making (see chart for details).   Patient presented to the emergency department today because of concerns for right hip pain after a fall about a week ago.  Initial x-ray did not show any osseous injury however given level of pain reported by patient CT was obtained to evaluate for occult fracture.  This also was negative for fracture.  There was some concerns for possible hemorrhage in the bone.  I discussed with Dr. Mack Guise with orthopedics.  Unfortunately patient cannot get an MRI at this time secondary to pacemaker.  However patient did feel better after pain medication was able to ambulate with walker.  Patient felt comfortable going home at this point.   ____________________________________________   FINAL CLINICAL IMPRESSION(S) / ED DIAGNOSES  Final diagnoses:  Right hip pain     Note: This dictation was prepared with Dragon dictation. Any transcriptional errors that result from this process are unintentional     Nance Pear, MD 04/09/19 [phone]

## 2019-04-09 NOTE — ED Notes (Signed)
PT ambulated several steps in the hall with this RN and Mitch, RN and a walker, pt ambulated slowly but states she feels okay and that she lives at home with her daughter who is able to help her. MD aware.

## 2019-04-09 NOTE — Discharge Instructions (Addendum)
Please seek medical attention for any high fevers, chest pain, shortness of breath, change in behavior, persistent vomiting, bloody stool or any other new or concerning symptoms.  

## 2019-04-09 NOTE — ED Triage Notes (Signed)
Pt from home via EMS with c/o RT hip pain after mechanical fall last week. PT was seen last week for injury. States she woke up this am and was unable to bear weight on affected side. PT is A&Ox4. NAD noted , no deformity noted

## 2019-04-09 NOTE — ED Notes (Signed)
Pt able to flex and extend R leg without pain noted. MD aware, per MD attempt to ambulate patient at this time.

## 2019-04-10 ENCOUNTER — Inpatient Hospital Stay
Admission: EM | Admit: 2019-04-10 | Discharge: 2019-04-15 | DRG: 470 | Disposition: A | Discharge status: Skilled Nursing Facility | Payer: Medicare Other | Attending: Internal Medicine | Admitting: Internal Medicine

## 2019-04-10 ENCOUNTER — Other Ambulatory Visit: Payer: Self-pay

## 2019-04-10 ENCOUNTER — Emergency Department: Payer: Medicare Other

## 2019-04-10 ENCOUNTER — Encounter: Payer: Self-pay | Admitting: Emergency Medicine

## 2019-04-10 DIAGNOSIS — Z79891 Long term (current) use of opiate analgesic: Secondary | ICD-10-CM

## 2019-04-10 DIAGNOSIS — I69349 Monoplegia of lower limb following cerebral infarction affecting unspecified side: Secondary | ICD-10-CM

## 2019-04-10 DIAGNOSIS — S72001A Fracture of unspecified part of neck of right femur, initial encounter for closed fracture: Secondary | ICD-10-CM | POA: Diagnosis present

## 2019-04-10 DIAGNOSIS — Z881 Allergy status to other antibiotic agents status: Secondary | ICD-10-CM

## 2019-04-10 DIAGNOSIS — I130 Hypertensive heart and chronic kidney disease with heart failure and stage 1 through stage 4 chronic kidney disease, or unspecified chronic kidney disease: Secondary | ICD-10-CM | POA: Diagnosis present

## 2019-04-10 DIAGNOSIS — Z20828 Contact with and (suspected) exposure to other viral communicable diseases: Secondary | ICD-10-CM | POA: Diagnosis present

## 2019-04-10 DIAGNOSIS — Z885 Allergy status to narcotic agent status: Secondary | ICD-10-CM

## 2019-04-10 DIAGNOSIS — I272 Pulmonary hypertension, unspecified: Secondary | ICD-10-CM | POA: Diagnosis present

## 2019-04-10 DIAGNOSIS — G2581 Restless legs syndrome: Secondary | ICD-10-CM | POA: Diagnosis present

## 2019-04-10 DIAGNOSIS — Z91048 Other nonmedicinal substance allergy status: Secondary | ICD-10-CM

## 2019-04-10 DIAGNOSIS — Z791 Long term (current) use of non-steroidal anti-inflammatories (NSAID): Secondary | ICD-10-CM

## 2019-04-10 DIAGNOSIS — I4821 Permanent atrial fibrillation: Secondary | ICD-10-CM | POA: Diagnosis present

## 2019-04-10 DIAGNOSIS — Z7951 Long term (current) use of inhaled steroids: Secondary | ICD-10-CM

## 2019-04-10 DIAGNOSIS — Z66 Do not resuscitate: Secondary | ICD-10-CM | POA: Diagnosis present

## 2019-04-10 DIAGNOSIS — Z87891 Personal history of nicotine dependence: Secondary | ICD-10-CM

## 2019-04-10 DIAGNOSIS — W19XXXA Unspecified fall, initial encounter: Secondary | ICD-10-CM

## 2019-04-10 DIAGNOSIS — G4733 Obstructive sleep apnea (adult) (pediatric): Secondary | ICD-10-CM | POA: Diagnosis present

## 2019-04-10 DIAGNOSIS — M5116 Intervertebral disc disorders with radiculopathy, lumbar region: Secondary | ICD-10-CM | POA: Diagnosis present

## 2019-04-10 DIAGNOSIS — M48061 Spinal stenosis, lumbar region without neurogenic claudication: Secondary | ICD-10-CM | POA: Diagnosis present

## 2019-04-10 DIAGNOSIS — F411 Generalized anxiety disorder: Secondary | ICD-10-CM | POA: Diagnosis present

## 2019-04-10 DIAGNOSIS — I5032 Chronic diastolic (congestive) heart failure: Secondary | ICD-10-CM | POA: Diagnosis present

## 2019-04-10 DIAGNOSIS — W1830XA Fall on same level, unspecified, initial encounter: Secondary | ICD-10-CM | POA: Diagnosis present

## 2019-04-10 DIAGNOSIS — Y92002 Bathroom of unspecified non-institutional (private) residence single-family (private) house as the place of occurrence of the external cause: Secondary | ICD-10-CM | POA: Diagnosis not present

## 2019-04-10 DIAGNOSIS — Z952 Presence of prosthetic heart valve: Secondary | ICD-10-CM

## 2019-04-10 DIAGNOSIS — K219 Gastro-esophageal reflux disease without esophagitis: Secondary | ICD-10-CM | POA: Diagnosis present

## 2019-04-10 DIAGNOSIS — I739 Peripheral vascular disease, unspecified: Secondary | ICD-10-CM | POA: Diagnosis present

## 2019-04-10 DIAGNOSIS — E785 Hyperlipidemia, unspecified: Secondary | ICD-10-CM | POA: Diagnosis present

## 2019-04-10 DIAGNOSIS — J449 Chronic obstructive pulmonary disease, unspecified: Secondary | ICD-10-CM | POA: Diagnosis present

## 2019-04-10 DIAGNOSIS — D62 Acute posthemorrhagic anemia: Secondary | ICD-10-CM | POA: Diagnosis not present

## 2019-04-10 DIAGNOSIS — N181 Chronic kidney disease, stage 1: Secondary | ICD-10-CM | POA: Diagnosis present

## 2019-04-10 DIAGNOSIS — Z950 Presence of cardiac pacemaker: Secondary | ICD-10-CM | POA: Diagnosis not present

## 2019-04-10 DIAGNOSIS — Z9181 History of falling: Secondary | ICD-10-CM | POA: Diagnosis not present

## 2019-04-10 DIAGNOSIS — Z7901 Long term (current) use of anticoagulants: Secondary | ICD-10-CM

## 2019-04-10 DIAGNOSIS — Z8781 Personal history of (healed) traumatic fracture: Secondary | ICD-10-CM | POA: Diagnosis not present

## 2019-04-10 DIAGNOSIS — Z96649 Presence of unspecified artificial hip joint: Secondary | ICD-10-CM

## 2019-04-10 DIAGNOSIS — Z79899 Other long term (current) drug therapy: Secondary | ICD-10-CM

## 2019-04-10 DIAGNOSIS — Z91040 Latex allergy status: Secondary | ICD-10-CM

## 2019-04-10 DIAGNOSIS — M25551 Pain in right hip: Secondary | ICD-10-CM | POA: Diagnosis present

## 2019-04-10 DIAGNOSIS — Z882 Allergy status to sulfonamides status: Secondary | ICD-10-CM

## 2019-04-10 LAB — COMPREHENSIVE METABOLIC PANEL
ALT: 12 U/L (ref 0–44)
AST: 19 U/L (ref 15–41)
Albumin: 4.3 g/dL (ref 3.5–5.0)
Alkaline Phosphatase: 99 U/L (ref 38–126)
Anion gap: 7 (ref 5–15)
BUN: 20 mg/dL (ref 8–23)
CO2: 26 mmol/L (ref 22–32)
Calcium: 10 mg/dL (ref 8.9–10.3)
Chloride: 104 mmol/L (ref 98–111)
Creatinine, Ser: 1.07 mg/dL — ABNORMAL HIGH (ref 0.44–1.00)
GFR calc Af Amer: 59 mL/min — ABNORMAL LOW (ref >60)
GFR calc non Af Amer: 51 mL/min — ABNORMAL LOW (ref >60)
Glucose, Bld: 98 mg/dL (ref 70–99)
Potassium: 4.1 mmol/L (ref 3.5–5.1)
Sodium: 137 mmol/L (ref 135–145)
Total Bilirubin: 1 mg/dL (ref 0.3–1.2)
Total Protein: 7.1 g/dL (ref 6.5–8.1)

## 2019-04-10 LAB — CBC WITH DIFFERENTIAL/PLATELET
Abs Immature Granulocytes: 0.05 10*3/uL (ref 0.00–0.07)
Basophils Absolute: 0.1 10*3/uL (ref 0.0–0.1)
Basophils Relative: 1 %
Eosinophils Absolute: 0.2 10*3/uL (ref 0.0–0.5)
Eosinophils Relative: 3 %
HCT: 38.6 % (ref 36.0–46.0)
Hemoglobin: 12 g/dL (ref 12.0–15.0)
Immature Granulocytes: 1 %
Lymphocytes Relative: 15 %
Lymphs Abs: 1.3 10*3/uL (ref 0.7–4.0)
MCH: 30.5 pg (ref 26.0–34.0)
MCHC: 31.1 g/dL (ref 30.0–36.0)
MCV: 98 fL (ref 80.0–100.0)
Monocytes Absolute: 0.8 10*3/uL (ref 0.1–1.0)
Monocytes Relative: 8 %
Neutro Abs: 6.8 10*3/uL (ref 1.7–7.7)
Neutrophils Relative %: 72 %
Platelets: 277 10*3/uL (ref 150–400)
RBC: 3.94 MIL/uL (ref 3.87–5.11)
RDW: 12.6 % (ref 11.5–15.5)
WBC: 9.2 10*3/uL (ref 4.0–10.5)
nRBC: 0 % (ref 0.0–0.2)

## 2019-04-10 LAB — PROTIME-INR
INR: 2.4 — ABNORMAL HIGH (ref 0.8–1.2)
Prothrombin Time: 26 seconds — ABNORMAL HIGH (ref 11.4–15.2)

## 2019-04-10 MED ORDER — ONDANSETRON HCL 4 MG/2ML IJ SOLN
4.0000 mg | Freq: Once | INTRAMUSCULAR | Status: AC
  Administered 2019-04-10: 22:00:00 4 mg via INTRAVENOUS
  Filled 2019-04-10: qty 2

## 2019-04-10 MED ORDER — ATORVASTATIN CALCIUM 20 MG PO TABS
80.0000 mg | ORAL_TABLET | Freq: Every evening | ORAL | Status: DC
  Administered 2019-04-11 – 2019-04-14 (×4): 80 mg via ORAL
  Filled 2019-04-10 (×4): qty 4

## 2019-04-10 MED ORDER — ROPINIROLE HCL 1 MG PO TABS
2.0000 mg | ORAL_TABLET | Freq: Every day | ORAL | Status: DC
  Administered 2019-04-11 – 2019-04-14 (×4): 2 mg via ORAL
  Filled 2019-04-10 (×4): qty 2

## 2019-04-10 MED ORDER — VITAMIN C 500 MG PO TABS
500.0000 mg | ORAL_TABLET | Freq: Every day | ORAL | Status: DC
  Administered 2019-04-11 – 2019-04-15 (×4): 500 mg via ORAL
  Filled 2019-04-10 (×4): qty 1

## 2019-04-10 MED ORDER — DARIFENACIN HYDROBROMIDE ER 7.5 MG PO TB24
7.5000 mg | ORAL_TABLET | Freq: Every day | ORAL | Status: DC
  Administered 2019-04-11 – 2019-04-15 (×5): 7.5 mg via ORAL
  Filled 2019-04-10 (×5): qty 1

## 2019-04-10 MED ORDER — HYDROCODONE-ACETAMINOPHEN 5-325 MG PO TABS
1.0000 | ORAL_TABLET | Freq: Four times a day (QID) | ORAL | Status: DC | PRN
  Administered 2019-04-10 – 2019-04-12 (×7): 2 via ORAL
  Filled 2019-04-10 (×7): qty 2

## 2019-04-10 MED ORDER — DICLOFENAC SODIUM 1 % TD GEL
2.0000 g | Freq: Two times a day (BID) | TRANSDERMAL | Status: DC | PRN
  Administered 2019-04-12: 2 g via TOPICAL
  Filled 2019-04-10 (×2): qty 100

## 2019-04-10 MED ORDER — VITAMIN D 25 MCG (1000 UNIT) PO TABS: 2000.0000 [IU] | ORAL_TABLET | ORAL | Status: DC

## 2019-04-10 MED ORDER — POLYETHYLENE GLYCOL 3350 17 G PO PACK: 17.0000 g | PACK | Freq: Every day | ORAL | Status: DC | PRN

## 2019-04-10 MED ORDER — FLUTICASONE PROPIONATE 50 MCG/ACT NA SUSP
2.0000 | Freq: Every day | NASAL | Status: DC
  Administered 2019-04-12 – 2019-04-15 (×2): 2 via NASAL
  Filled 2019-04-10 (×2): qty 16

## 2019-04-10 MED ORDER — KETOROLAC TROMETHAMINE 30 MG/ML IJ SOLN
30.0000 mg | Freq: Four times a day (QID) | INTRAMUSCULAR | Status: AC | PRN
  Administered 2019-04-10: 30 mg via INTRAVENOUS
  Filled 2019-04-10: qty 1

## 2019-04-10 MED ORDER — DULOXETINE HCL 60 MG PO CPEP
60.0000 mg | ORAL_CAPSULE | Freq: Every day | ORAL | Status: DC
  Administered 2019-04-11 – 2019-04-14 (×4): 60 mg via ORAL
  Filled 2019-04-10 (×5): qty 1

## 2019-04-10 MED ORDER — MORPHINE SULFATE (PF) 2 MG/ML IV SOLN: 0.5000 mg | INTRAVENOUS | Status: DC | PRN

## 2019-04-10 MED ORDER — MIRABEGRON ER 50 MG PO TB24: 50.0000 mg | ORAL_TABLET | Freq: Every day | ORAL | Status: DC

## 2019-04-10 MED ORDER — CLINDAMYCIN PHOSPHATE 600 MG/50ML IV SOLN
600.0000 mg | INTRAVENOUS | Status: AC
  Filled 2019-04-10: qty 50

## 2019-04-10 MED ORDER — PANTOPRAZOLE SODIUM 40 MG PO TBEC
40.0000 mg | DELAYED_RELEASE_TABLET | Freq: Every day | ORAL | Status: DC
  Administered 2019-04-11 – 2019-04-15 (×4): 40 mg via ORAL
  Filled 2019-04-10 (×4): qty 1

## 2019-04-10 MED ORDER — DILTIAZEM HCL ER COATED BEADS 120 MG PO CP24
120.0000 mg | ORAL_CAPSULE | Freq: Every day | ORAL | Status: DC
  Filled 2019-04-10 (×6): qty 1

## 2019-04-10 MED ORDER — MAGNESIUM OXIDE 400 (241.3 MG) MG PO TABS
400.0000 mg | ORAL_TABLET | Freq: Two times a day (BID) | ORAL | Status: DC
  Administered 2019-04-11 – 2019-04-15 (×8): 400 mg via ORAL
  Filled 2019-04-10 (×8): qty 1

## 2019-04-10 MED ORDER — HYDROMORPHONE HCL 1 MG/ML IJ SOLN
0.5000 mg | Freq: Once | INTRAMUSCULAR | Status: AC
  Administered 2019-04-10: 0.5 mg via INTRAVENOUS
  Filled 2019-04-10: qty 1

## 2019-04-10 MED ORDER — GABAPENTIN 100 MG PO CAPS
100.0000 mg | ORAL_CAPSULE | Freq: Two times a day (BID) | ORAL | Status: DC
  Administered 2019-04-11 – 2019-04-15 (×8): 100 mg via ORAL
  Filled 2019-04-10 (×8): qty 1

## 2019-04-10 NOTE — ED Notes (Signed)
ED Provider Malinda  at bedside. 

## 2019-04-10 NOTE — ED Triage Notes (Addendum)
Pt presents to ER from home via [facility], pt fell this evening in her bathroom denies hitting her head, reports right hip pain. Pt is awake, alert and oriented, per EMS pt was seen in ER yesterday due to right side weakness, history of stroke with deficit in left side, pt talks in complete sentences no respiratory distress noted

## 2019-04-10 NOTE — ED Provider Notes (Addendum)
[facility] Emergency Department Provider Note   ____________________________________________   None    (approximate)  I have reviewed the triage vital signs and the nursing notes.   HISTORY  Chief Complaint Hip Pain (Right ) and Fall    HPI Rhonda Griffith is a 73 y.o. female who was seen yesterday after falling and having some pain in her right hip went home was walking fell again and has more pain in her right hip.  She says it hurts a lot and she cannot move it.  The pain radiates from her hip towards her back.  Pain is severe   Patient reports right-sided weakness since yesterday when she fell initially.  She says is still weak today although of course I cannot test her right leg her right arm is not weak.  There is no incoordination either.   Past Medical History:  Diagnosis Date  . Atrial fibrillation, permanent    a. CHA2DS2VASc = 4-->coumadin;  b. 09/2014 Echo: EF 55-60%, normal wall motion, mild AI/AS, nl MV, mod dil LA, mildly dil RA, mild to mod TR, PASP 47mmHg.  [name]. Chronic kidney disease, stage I   . CVA (cerebral infarction)    a. 04/2015 - anticoagulation switched from eliquis to xarelto to coumadin.  . Leukocytosis   . Pacemaker -Medtronic    a. implant for SSS  . PAD (peripheral artery disease) (HCC)    a. w/ left lower ext claudication s/p ABI's 04/2015 showing nl ABI on Right with abnl waveforms on left sugg of L SFA dzs.  [name]. Restless leg syndrome   . 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    Patient Active Problem List   Diagnosis Date Noted  . Closed right hip fracture, initial encounter (HCC) 04/10/2019  . Degeneration of lumbar intervertebral disc 02/21/2019  . Lumbar radiculopathy 02/21/2019  . Spinal stenosis of lumbar region 02/21/2019  . Cellulitis 08/30/2018  . Closed fracture of distal end of left fibula 06/28/2018  . Acute respiratory  failure with hypoxia (HCC) 10/14/2016  . 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 Influenza A 10/14/2016  . Status post cardiac pacemaker procedure 10/14/2016  . Stroke (HCC) 10/14/2016  . ERRONEOUS ENCOUNTER--DISREGARD 07/03/2016  . Carotid bruit 12/22/2015  . Hyperlipidemia 10/23/2015  . Atrial fibrillation, permanent   . Pacemaker -Medtronic   . 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 Chronic kidney disease, unspecified   . Encounter for anticoagulation discussion and counseling 06/25/2015  . Acute cardioembolic stroke (HCC) 06/25/2015  . PAD (peripheral artery disease) (HCC) 06/25/2015  . Pure hypercholesterolemia 04/30/2015  . History of recent stroke 04/28/2015  . Essential hypertension 12/09/2014  . Gait instability 09/08/2014  . Status post total bilateral knee replacement 06/24/2014  . UTI (urinary tract infection) 02/03/2014  . Fatigue 10/01/2013  . Shortness of breath 09/12/2012  . Stress disorder, acute 09/12/2012  . Tachycardia 09/12/2012  . Atrial fibrillation (HCC) 09/12/2012  . Melanoma in situ (HCC) 07/11/2012  . Morphea 07/11/2012  . Long term (current) use of anticoagulants 07/02/2012  . History of malignant melanoma of skin 05/30/2012  . CMC arthritis 02/02/2012  . Sprain of MCL (medial collateral ligament) of knee 01/24/2012  . Squamous cell carcinoma 12/21/2011  . Carpal tunnel syndrome of left wrist 11/28/2011  . Sinoatrial node dysfunction (HCC)   . Movement disorder 08/08/2011  . Edema 06/07/2011  . Chronic diastolic heart failure (HCC)  05/27/2011  . Anxiety state 05/14/2011  . Esophageal reflux 05/14/2011  . Mitral valve replaced 05/14/2011  . Myalgia and myositis, unspecified 05/14/2011  . OSA (obstructive sleep apnea) 05/14/2011  . Osteoarthritis 05/14/2011  . Pulmonary hypertension (HCC) 05/14/2011  . Cardiac pacemaker in situ 10/03/2010    Past Surgical History:  Procedure Laterality Date  . bladder injection    . botox bladder    . INSERT /  REPLACE / REMOVE PACEMAKER     implant for SSS  . MELANOMA EXCISION     face  . MITRAL VALVE REPLACEMENT     s/p mechanical valve replaced with bovine valve 2010  . NASAL SINUS SURGERY    . PPM GENERATOR CHANGEOUT N/A 09/21/2018   Procedure: PPM GENERATOR CHANGEOUT;  Surgeon: Duke SalviaKlein, Steven C, MD;  [facility];  Service: Cardiovascular;  Laterality: N/A;    Prior to Admission medications   Medication Sig Start Date End Date Taking? Authorizing Provider  ascorbic acid (VITAMIN C) 500 MG tablet Take 500 mg by mouth daily.   Yes [provider]  atorvastatin (LIPITOR) 80 MG tablet Take 80 mg by mouth every evening.    Yes [provider]  bisacodyl (DULCOLAX) 5 MG EC tablet Take 1 tablet (5 mg total) by mouth daily as needed (if taking the narcotic pain medication). 04/09/19 04/08/20 Yes Phineas SemenGoodman, Graydon, MD  Cholecalciferol (VITAMIN D3) 1000 UNITS CAPS Take 2,000 Units by mouth every 30 (thirty) days.    Yes [provider]  cyclobenzaprine (FLEXERIL) 5 MG tablet Take 5 mg by mouth 2 (two) times daily as needed for muscle spasms. 02/26/19  Yes [provider]  diclofenac sodium (VOLTAREN) 1 % GEL Apply 2-4 g topically 2 (two) times daily as needed (for knee pain.).    Yes [provider]  diltiazem (CARDIZEM CD) 120 MG 24 hr capsule Take 1 capsule (120 mg total) by mouth daily. 09/06/16  Yes Gollan, Tollie Pizzaimothy J, MD  DULoxetine (CYMBALTA) 60 MG capsule Take 60 mg by mouth daily.    Yes [provider]  fluticasone (FLONASE) 50 MCG/ACT nasal spray Place 2 sprays into both nostrils daily.    Yes [provider]  furosemide (LASIX) 20 MG tablet Take 20 mg by mouth daily as needed (fluid retention/swelling.).    Yes [provider]  gabapentin (NEURONTIN) 100 MG capsule Take 100 mg by mouth as directed. Take one capsule (100mg ) twice a day, may gradually increase to three times a day if needed. 03/17/18  Yes [provider]  magnesium oxide (MAG-OX) 400 MG tablet Take 400 mg by mouth 2 (two) times daily.    Yes [provider]  oxyCODONE-acetaminophen (PERCOCET) 5-325 MG tablet Take 1 tablet by mouth every 4 (four) hours as needed for severe pain. 04/09/19 04/08/20 Yes Phineas SemenGoodman, Graydon, MD  pantoprazole (PROTONIX) 40 MG tablet Take 40 mg by mouth daily.    Yes [provider]  potassium chloride (K-DUR) 10 MEQ tablet Take 1 tablet (10 mEq total) by mouth daily as needed. Take with lasix Patient taking differently: Take 10 mEq by mouth daily as needed (with lasix (fluid retention/swelling)).  09/06/16  Yes Gollan, Tollie Pizzaimothy J, MD  rOPINIRole (REQUIP) 2 MG tablet Take 2 mg by mouth at bedtime.  05/29/15  Yes [provider]  solifenacin (VESICARE) 10 MG tablet Take 10 mg by mouth daily.  04/03/18  Yes [provider]  warfarin (COUMADIN) 5 MG tablet Take 2.5-5 mg by  mouth as directed. Take 2.5mg  on all days EXCEPT Tuesday. On Tuesday take 5mg .   Yes [provider]  albuterol (ACCUNEB) 1.25 MG/3ML nebulizer solution Take 1 ampule by nebulization every 6 (six) hours as needed for wheezing.    [provider]  albuterol (PROVENTIL HFA;VENTOLIN HFA) 108 (90 BASE) MCG/ACT inhaler Inhale 2 puffs into the lungs every 6 (six) hours as needed for wheezing.    [provider]  diazepam (VALIUM) 5 MG tablet Take 5 mg by mouth every 12 (twelve) hours as needed for anxiety.    [provider]  oxyCODONE-acetaminophen (PERCOCET) 5-325 MG tablet Take 0.5 tablets by mouth every 8 (eight) hours as needed for severe pain. Patient not taking: Reported on 04/11/2019 06/01/18 06/01/19  Sharyn CreamerQuale, Mark, MD    Allergies Morphine, Other, Statins, Tizanidine, Tape, Tapentadol, Cephalexin, Latex, and Sulfa antibiotics  Family History  Family history unknown: Yes    Social History Social History   Tobacco Use  . Smoking status: Former Smoker    Packs/day: 1.00     Years: 5.00    Pack years: 5.00    Types: Cigarettes    Quit date: 12/29/1970    Years since quitting: 48.3  . Smokeless tobacco: Never Used  Substance Use Topics  . Alcohol use: No  . Drug use: No    Review of Systems  Constitutional: No fever/chills Eyes: No visual changes. ENT: No sore throat. Cardiovascular: Denies chest pain. Respiratory: Denies shortness of breath. Gastrointestinal: No abdominal pain.  No nausea, no vomiting.  No diarrhea.  No constipation. Genitourinary: Negative for dysuria. Musculoskeletal: Negative for back pain.  Hip pain does radiate towards the back Skin: Negative for rash. Neurological: Negative for headaches, focal weakness  ____________________________________________   PHYSICAL EXAM:  VITAL SIGNS: ED Triage Vitals  Enc Vitals Group     BP      Pulse      Resp      Temp      Temp src      SpO2      Weight      Height      Head Circumference      Peak Flow      Pain Score      Pain Loc      Pain Edu?      Excl. in GC?     Constitutional: Alert and oriented.  In pain Eyes: Conjunctivae are normal.  Head: Atraumatic. Nose: No congestion/rhinnorhea. Mouth/Throat: Mucous membranes are moist.  Oropharynx non-erythematous. Neck: No stridor Cardiovascular: Normal rate, regular rhythm. Grossly normal heart sounds.  Good peripheral circulation. Respiratory: Normal respiratory effort.  No retractions. Lungs CTAB. Gastrointestinal: Soft and nontender. No distention. No abdominal bruits. No CVA tenderness. Musculoskeletal: Right hip pain and tenderness to palpation.  No edema.  Normal dorsalis pedis pulses bilaterally Neurologic:  Normal speech and language. No gross focal neurologic deficits are appreciated.  Pacifically there is no facial droop.  Other cranial nerves appear normal.  Visual fields were not checked.  Cerebellar finger-nose and rapid alternating movements and hands is normal on the right slightly slow on the left she has had  a left-sided stroke in the past.  Patient said that this is normal for her..  Motor strength in arms and hands is normal and equal bilaterally.  There is no numbness reported.  Except that the patient cannot move her right leg because of pain Skin:  Skin is warm, dry and intact. No rash noted.  ____________________________________________   LABS (all labs ordered are listed, but only abnormal results are displayed)  Labs Reviewed  COMPREHENSIVE METABOLIC PANEL - Abnormal; Notable for the following components:      Result Value   Creatinine, Ser 1.07 (*)    GFR calc non Af Amer 51 (*)    GFR calc Af Amer 59 (*)    All other components within normal limits  PROTIME-INR - Abnormal; Notable for the following components:   Prothrombin Time 26.0 (*)    INR 2.4 (*)    All other components within normal limits  CBC - Abnormal; Notable for the following components:   RBC 3.52 (*)    Hemoglobin 10.7 (*)    HCT 34.2 (*)    All other components within normal limits  BASIC METABOLIC PANEL - Abnormal; Notable for the following components:   Glucose, Bld 108 (*)    Creatinine, Ser 1.38 (*)    GFR calc non Af Amer 37 (*)    GFR calc Af Amer 43 (*)    All other components within normal limits  PROTIME-INR - Abnormal; Notable for the following components:   Prothrombin Time 28.7 (*)    INR 2.8 (*)    All other components within normal limits  APTT - Abnormal; Notable for the following components:   aPTT 46 (*)    All other components within normal limits  GLUCOSE, CAPILLARY - Abnormal; Notable for the following components:   Glucose-Capillary 150 (*)    All other components within normal limits  PROTIME-INR - Abnormal; Notable for the following components:   Prothrombin Time 17.4 (*)    INR 1.4 (*)    All other components within normal limits  SARS CORONAVIRUS 2 (HOSPITAL ORDER, [place] [facility])  MRSA PCR SCREENING  CBC WITH DIFFERENTIAL/PLATELET  URINALYSIS,  COMPLETE (UACMP) WITH MICROSCOPIC  TYPE AND SCREEN  ABO/RH   ____________________________________________  EKG  EKG read interpreted by me shows a fully paced rhythm nothing that looks acute. ____________________________________________  RADIOLOGY  ED MD interpretation: Hip x-ray shows a right-sided femoral neck fracture.  Chest x-ray shows a large heart otherwise normal there is also a median sternotomy of course and no mitral valve  Official radiology report(s): No results found.  ____________________________________________   PROCEDURES  Procedure(s) performed (including Critical Care):  Procedures   ____________________________________________   INITIAL IMPRESSION / ASSESSMENT AND PLAN / ED COURSE   We will get patient in the hospital to repair her hip.  We will also get a head CT which have not been at this point.         ____________________________________________   FINAL CLINICAL IMPRESSION(S) / ED DIAGNOSES  Final diagnoses:  Fall, initial encounter  Closed fracture of right hip, initial encounter [facility])     ED Discharge Orders    None       Note:  This document was prepared using Dragon voice recognition software and may include unintentional dictation errors.    Nena Polio, MD 04/10/19 [id number]    Nena Polio, MD 04/12/19 [phone]

## 2019-04-11 ENCOUNTER — Other Ambulatory Visit: Payer: Self-pay

## 2019-04-11 LAB — CBC
HCT: 34.2 % — ABNORMAL LOW (ref 36.0–46.0)
Hemoglobin: 10.7 g/dL — ABNORMAL LOW (ref 12.0–15.0)
MCH: 30.4 pg (ref 26.0–34.0)
MCHC: 31.3 g/dL (ref 30.0–36.0)
MCV: 97.2 fL (ref 80.0–100.0)
Platelets: 216 10*3/uL (ref 150–400)
RBC: 3.52 MIL/uL — ABNORMAL LOW (ref 3.87–5.11)
RDW: 12.7 % (ref 11.5–15.5)
WBC: 10.2 10*3/uL (ref 4.0–10.5)
nRBC: 0 % (ref 0.0–0.2)

## 2019-04-11 LAB — APTT: aPTT: 46 seconds — ABNORMAL HIGH (ref 24–36)

## 2019-04-11 LAB — BASIC METABOLIC PANEL
Anion gap: 6 (ref 5–15)
BUN: 22 mg/dL (ref 8–23)
CO2: 26 mmol/L (ref 22–32)
Calcium: 9.4 mg/dL (ref 8.9–10.3)
Chloride: 105 mmol/L (ref 98–111)
Creatinine, Ser: 1.38 mg/dL — ABNORMAL HIGH (ref 0.44–1.00)
GFR calc Af Amer: 43 mL/min — ABNORMAL LOW (ref >60)
GFR calc non Af Amer: 37 mL/min — ABNORMAL LOW (ref >60)
Glucose, Bld: 108 mg/dL — ABNORMAL HIGH (ref 70–99)
Potassium: 4.8 mmol/L (ref 3.5–5.1)
Sodium: 137 mmol/L (ref 135–145)

## 2019-04-11 LAB — SARS CORONAVIRUS 2 BY RT PCR (HOSPITAL ORDER, PERFORMED IN ~~LOC~~ HOSPITAL LAB): SARS Coronavirus 2: NEGATIVE

## 2019-04-11 LAB — PROTIME-INR
INR: 2.8 — ABNORMAL HIGH (ref 0.8–1.2)
Prothrombin Time: 28.7 seconds — ABNORMAL HIGH (ref 11.4–15.2)

## 2019-04-11 LAB — ABO/RH: ABO/RH(D): O POS

## 2019-04-11 LAB — MRSA PCR SCREENING: MRSA by PCR: NEGATIVE

## 2019-04-11 LAB — GLUCOSE, CAPILLARY: Glucose-Capillary: 150 mg/dL — ABNORMAL HIGH (ref 70–99)

## 2019-04-11 MED ORDER — SODIUM CHLORIDE 0.9 % IV SOLN
INTRAVENOUS | Status: DC
  Administered 2019-04-11 – 2019-04-13 (×4): via INTRAVENOUS

## 2019-04-11 MED ORDER — VITAMIN K1 10 MG/ML IJ SOLN
10.0000 mg | Freq: Once | INTRAVENOUS | Status: AC
  Administered 2019-04-11: 10 mg via INTRAVENOUS
  Filled 2019-04-11: qty 1

## 2019-04-11 MED ORDER — ENSURE ENLIVE PO LIQD
237.0000 mL | Freq: Two times a day (BID) | ORAL | Status: DC
  Administered 2019-04-11 – 2019-04-15 (×8): 237 mL via ORAL

## 2019-04-11 NOTE — Progress Notes (Signed)
[employer] [facility] [place] [facility]                                                                                                                                                                                  Patient Demographics   Myrtice LauthVirginia Arps, is a 73 y.o. female, DOB - 05/27/1946, ZOX:[id number]  Admit date - 04/10/2019   Admitting Physician Hannah BeatJan A Mansy, MD  Outpatient Primary MD for the patient is Shapely-Quinn, Desiree Lucyodd Goodhue, MD   LOS - 1  Subjective: Patient states that her pain is under control She denies any chest pain or shortness of breath   Review of Systems:   CONSTITUTIONAL: No documented fever. No fatigue, weakness. No weight gain, no weight loss.  EYES: No blurry or double vision.  ENT: No tinnitus. No postnasal drip. No redness of the oropharynx.  RESPIRATORY: No cough, no wheeze, no hemoptysis. No dyspnea.  CARDIOVASCULAR: No chest pain. No orthopnea. No palpitations. No syncope.  GASTROINTESTINAL: No nausea, no vomiting or diarrhea. No abdominal pain. No melena or hematochezia.  GENITOURINARY: No dysuria or hematuria.  ENDOCRINE: No polyuria or nocturia. No heat or cold intolerance.  HEMATOLOGY: No anemia. No bruising. No bleeding.  INTEGUMENTARY: No rashes. No lesions.  MUSCULOSKELETAL: Right hip pain NEUROLOGIC: No numbness, tingling, or ataxia. No seizure-type activity.  PSYCHIATRIC: No anxiety. No insomnia. No ADD.    Vitals:   Vitals:   04/11/19 0045 04/11/19 0100 04/11/19 0238 04/11/19 0732  BP:  (!) 122/50  (!) 120/47  Pulse: 66 67 61 63  Resp: 17 18  16   Temp:    (!) 97.5 F (36.4 C)  TempSrc:    Oral  SpO2: 96% 97% 98% 98%  Weight:      Height:        Wt Readings from Last 3 Encounters:  04/10/19 77.1 kg  02/26/19 72.6 kg  09/21/18 74.8 kg    No intake or output data in the 24 hours ending 04/11/19 1347  Physical Exam:   GENERAL: Pleasant-appearing in no apparent distress.  HEAD, EYES, EARS, NOSE AND THROAT:  Atraumatic, normocephalic. Extraocular muscles are intact. Pupils equal and reactive to light. Sclerae anicteric. No conjunctival injection. No oro-pharyngeal erythema.  NECK: Supple. There is no jugular venous distention. No bruits, no lymphadenopathy, no thyromegaly.  HEART: Regular rate and rhythm,. No murmurs, no rubs, no clicks.  LUNGS: Clear to auscultation bilaterally. No rales or rhonchi. No wheezes.  ABDOMEN: Soft, flat, nontender, nondistended. Has good bowel sounds. No hepatosplenomegaly appreciated.  EXTREMITIES: No evidence of any cyanosis, clubbing, or peripheral edema.  +2 pedal and radial pulses bilaterally.  NEUROLOGIC: The patient is alert, awake,  and oriented x3 with no focal motor or sensory deficits appreciated bilaterally.  SKIN: Moist and warm with no rashes appreciated.  Psych: Not anxious, depressed LN: No inguinal LN enlargement    Antibiotics   Anti-infectives (From admission, onward)   Start     Dose/Rate Route Frequency Ordered Stop   04/10/19 2330  clindamycin (CLEOCIN) IVPB 600 mg    Note to Pharmacy: [place] PATIENT TO THE OR. DO NOT ADMINISTER ON THE UNIT!   600 mg 100 mL/hr over 30 Minutes Intravenous To Surgery 04/10/19 2243 04/11/19 2330      Medications   Scheduled Meds: . atorvastatin  80 mg Oral QPM  . cholecalciferol  2,000 Units Oral Q30 days  . darifenacin  7.5 mg Oral Daily  . diltiazem  120 mg Oral Daily  . DULoxetine  60 mg Oral QHS  . feeding supplement (ENSURE ENLIVE)  237 mL Oral BID BM  . fluticasone  2 spray Each Nare Daily  . gabapentin  100 mg Oral BID  . magnesium oxide  400 mg Oral BID  . pantoprazole  40 mg Oral Daily  . rOPINIRole  2 mg Oral QHS  . ascorbic acid  500 mg Oral Daily   Continuous Infusions: . clindamycin (CLEOCIN) IV     PRN Meds:.diclofenac sodium, HYDROcodone-acetaminophen, ketorolac, polyethylene glycol   Data Review:   Micro Results Recent Results (from the past 240 hour(s))  SARS  Coronavirus 2 (CEPHEID- Performed in [place] hospital lab), Hosp Order     Status: None   Collection Time: 04/10/19 11:19 PM   Specimen: Nasopharyngeal Swab  Result Value Ref Range Status   SARS Coronavirus 2 NEGATIVE NEGATIVE Final    Comment: (NOTE) If result is NEGATIVE SARS-CoV-2 target nucleic acids are NOT DETECTED. The SARS-CoV-2 RNA is generally detectable in upper and lower  respiratory specimens during the acute phase of infection. The lowest  concentration of SARS-CoV-2 viral copies this assay can detect is 250  copies / mL. A negative result does not preclude SARS-CoV-2 infection  and should not be used as the sole basis for treatment or other  patient management decisions.  A negative result may occur with  improper specimen collection / handling, submission of specimen other  than nasopharyngeal swab, presence of viral mutation(s) within the  areas targeted by this assay, and inadequate number of viral copies  (<250 copies / mL). A negative result must be combined with clinical  observations, patient history, and epidemiological information. If result is POSITIVE SARS-CoV-2 target nucleic acids are DETECTED. The SARS-CoV-2 RNA is generally detectable in upper and lower  respiratory specimens dur ing the acute phase of infection.  Positive  results are indicative of active infection with SARS-CoV-2.  Clinical  correlation with patient history and other diagnostic information is  necessary to determine patient infection status.  Positive results do  not rule out bacterial infection or co-infection with other viruses. If result is PRESUMPTIVE POSTIVE SARS-CoV-2 nucleic acids MAY BE PRESENT.   A presumptive positive result was obtained on the submitted specimen  and confirmed on repeat testing.  While 2019 novel coronavirus  (SARS-CoV-2) nucleic acids may be present in the submitted sample  additional confirmatory testing may be necessary for epidemiological  and / or  clinical management purposes  to differentiate between  SARS-CoV-2 and other Sarbecovirus currently known to infect humans.  If clinically indicated additional testing with an alternate test  methodology [phone]) is advised. The SARS-CoV-2 RNA is generally  detectable in upper and lower respiratory sp ecimens during the acute  phase of infection. The expected result is Negative. Fact Sheet for Patients:  BoilerBrush.com.cyhttps://www.fda.gov/media/136312/download Fact Sheet for Healthcare Providers: https://pope.com/https://www.fda.gov/media/136313/download This test is not yet approved or cleared by the [place] FDA and has been authorized for detection and/or diagnosis of SARS-CoV-2 by FDA under an Emergency Use Authorization (EUA).  This EUA will remain in effect (meaning this test can be used) for the duration of the COVID-19 declaration under Section 564(b)(1) of the Act, 21 U.S.C. section 360bbb-3(b)(1), unless the authorization is terminated or revoked sooner. Performed at [facility], [address] [place], [place] [address]   MRSA PCR Screening     Status: None   Collection Time: 04/11/19  2:35 AM   Specimen: Nasal Mucosa; Nasopharyngeal  Result Value Ref Range Status   MRSA by PCR NEGATIVE NEGATIVE Final    Comment:        The GeneXpert MRSA Assay (FDA approved for NASAL specimens only), is one component of a comprehensive MRSA colonization surveillance program. It is not intended to diagnose MRSA infection nor to guide or monitor treatment for MRSA infections. Performed at [facility], [address] [place], [place] [address]     Radiology Reports Dg Chest 1 View  Result Date: 04/09/2019 CLINICAL DATA:  Right hip pain after fall. EXAM: CHEST  1 VIEW COMPARISON:  Radiographs of March 24, 2018. FINDINGS: Stable cardiomegaly. Status post cardiac valve repair. Left-sided pacemaker is unchanged in position. Atherosclerosis of thoracic aorta is noted. Both lungs are clear.  The visualized skeletal structures are unremarkable. IMPRESSION: No active disease. Aortic Atherosclerosis (ICD10-I70.0). Electronically Signed   By: Lupita RaiderJames  Green Jr M.D.   On: 04/09/2019 16:14   Ct Head Wo Contrast  Result Date: 04/10/2019 CLINICAL DATA:  73 y/o  F; fall with right hip pain. EXAM: CT HEAD WITHOUT CONTRAST TECHNIQUE: Contiguous axial images were obtained from the base of the skull through the vertex without intravenous contrast. COMPARISON:  08/03/2018 CT head. FINDINGS: Brain: No evidence of acute infarction, hemorrhage, extra-axial collection or mass lesion/mass effect. Stable chronic infarcts in right basal ganglia, left thalamus, and right cerebellum. Stable chronic hydrocephalus. Stable chronic microvascular ischemic changes of white matter and volume loss of the brain. Vascular: Calcific atherosclerosis of internal carotid arteries. No hyperdense vessel. Skull: Normal. Negative for fracture or focal lesion. Sinuses/Orbits: No acute finding. Other: None. IMPRESSION: 1. No acute intracranial abnormality identified. 2. Stable chronic microvascular ischemic changes and volume loss of the brain. 3. Stable chronic infarcts in right basal ganglia, left thalamus, and right cerebellum. 4. Stable chronic hydrocephalus. Electronically Signed   By: Mitzi HansenLance  Furusawa-Stratton M.D.   On: 04/10/2019 22:13   Ct Hip Right Wo Contrast  Result Date: 04/09/2019 CLINICAL DATA:  Right hip pain status post fall EXAM: CT OF THE RIGHT HIP WITHOUT CONTRAST TECHNIQUE: Multidetector CT imaging of the right hip was performed according to the standard protocol. Multiplanar CT image reconstructions were also generated. COMPARISON:  None. FINDINGS: Bones/Joint/Cartilage No fracture or dislocation. Normal alignment. No joint effusion. Mild osteoarthritis of the right hip. Heterogeneous marrow of the proximal femoral diaphysis which may reflect red marrow or hemorrhage. Ligaments Ligaments are suboptimally evaluated by  CT. Muscles and Tendons Muscles are normal.  No intramuscular fluid collection. Soft tissue No fluid collection or hematoma.  No soft tissue mass. IMPRESSION: No acute osseous injury of the right hip. Heterogeneous marrow of the proximal femoral diaphysis which may reflect  red marrow or hemorrhage. Recommend further evaluation with MR of the femur. Electronically Signed   By: Elige KoHetal  Junie Avilla   On: 04/09/2019 17:10   Dg Chest Portable 1 View  Result Date: 04/10/2019 CLINICAL DATA:  Status post fall with right hip pain. EXAM: PORTABLE CHEST 1 VIEW COMPARISON:  April 09, 2019 FINDINGS: The heart size and mediastinal contours are stable. Cardiac valvular replacement ring and cardiac pacemaker are unchanged. The heart size is enlarged. The lungs are hyperinflated. There is no focal infiltrate, pulmonary edema, or pleural effusion. The visualized skeletal structures are unremarkable. IMPRESSION: No active cardiopulmonary disease. Electronically Signed   By: Sherian ReinWei-Chen  Lin M.D.   On: 04/10/2019 21:48   Dg Hip Unilat W Or Wo Pelvis 2-3 Views Right  Result Date: 04/10/2019 CLINICAL DATA:  Status post fall with right hip pain. EXAM: DG HIP (WITH OR WITHOUT PELVIS) 2-3V RIGHT COMPARISON:  April 09, 2019 FINDINGS: There is deformity of the proximal right femur in the femoral neck region suspicious for fracture. No other acute fracture dislocation is identified. IMPRESSION: There is deformity of the proximal right femur in the femoral neck region suspicious for fracture. Electronically Signed   By: Sherian ReinWei-Chen  Lin M.D.   On: 04/10/2019 21:47   Dg Hip Unilat W Or Wo Pelvis 2-3 Views Right  Result Date: 04/09/2019 CLINICAL DATA:  Right hip pain after fall. EXAM: DG HIP (WITH OR WITHOUT PELVIS) 2-3V RIGHT COMPARISON:  None. FINDINGS: There is no evidence of hip fracture or dislocation. There is no evidence of arthropathy or other focal bone abnormality. IMPRESSION: Negative. Electronically Signed   By: Lupita RaiderJames  Green Jr M.D.    On: 04/09/2019 16:12     CBC Recent Labs  Lab 04/10/19 2051 04/11/19 0253  WBC 9.2 10.2  HGB 12.0 10.7*  HCT 38.6 34.2*  PLT 277 216  MCV 98.0 97.2  MCH 30.5 30.4  MCHC 31.1 31.3  RDW 12.6 12.7  LYMPHSABS 1.3  --   MONOABS 0.8  --   EOSABS 0.2  --   BASOSABS 0.1  --     Chemistries  Recent Labs  Lab 04/10/19 2051 04/11/19 0253  NA 137 137  K 4.1 4.8  CL 104 105  CO2 26 26  GLUCOSE 98 108*  BUN 20 22  CREATININE 1.07* 1.38*  CALCIUM 10.0 9.4  AST 19  --   ALT 12  --   ALKPHOS 99  --   BILITOT 1.0  --    ------------------------------------------------------------------------------------------------------------------ estimated creatinine clearance is 36.8 mL/min (A) (by C-G formula based on SCr of 1.38 mg/dL (H)). ------------------------------------------------------------------------------------------------------------------ No results for input(s): HGBA1C in the last 72 hours. ------------------------------------------------------------------------------------------------------------------ No results for input(s): CHOL, HDL, LDLCALC, TRIG, CHOLHDL, LDLDIRECT in the last 72 hours. ------------------------------------------------------------------------------------------------------------------ No results for input(s): TSH, T4TOTAL, T3FREE, THYROIDAB in the last 72 hours.  Invalid input(s): FREET3 ------------------------------------------------------------------------------------------------------------------ No results for input(s): VITAMINB12, FOLATE, FERRITIN, TIBC, IRON, RETICCTPCT in the last 72 hours.  Coagulation profile Recent Labs  Lab 04/10/19 2051 04/11/19 0253  INR 2.4* 2.8*    No results for input(s): DDIMER in the last 72 hours.  Cardiac Enzymes No results for input(s): CKMB, TROPONINI, MYOGLOBIN in the last 168 hours.  Invalid input(s):  CK ------------------------------------------------------------------------------------------------------------------ Invalid input(s): POCBNP    Assessment & Plan   1.  right hip fracture -  Seen by orthopedics -INR needs to be lower prior to any intervention -Patient has no cardio pulmonary symptoms okay to proceed to surgery once INR is  lower  2.  Atrial fibrillation - Also with a history of mitral valve and aortic valve stenosis as well as pacemaker placement -  Hold Coumadin for now   3.  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  CKD - Will monitor renal function closely. -Renal function stable  5.  Peripheral artery disease     Code Status Orders  (From admission, onward)         Start     Ordered   04/10/19 2246  Full code  Continuous     04/10/19 2245        Code Status History    Date Active Date Inactive Code Status Order ID Comments User Context   09/21/2018 1355 09/21/2018 1827 Full Code [id number]  Duke Salvia, MD Inpatient   Advance Care Planning Activity           Consults orthopedics  DVT Prophylaxis SCDs  Lab Results  Component Value Date   PLT 216 04/11/2019     Time Spent in minutes 45 minutes greater than 50% of time spent in care coordination and counseling patient regarding the condition and plan of care.   Auburn Bilberry M.D on 04/11/2019 at 1:47 PM  Between 7am to 6pm - Pager - [phone]  After 6pm go to [email or web address] - Social research officer, government  [employer]   Office  [phone]

## 2019-04-11 NOTE — H&P (Signed)
[employer] - [place] at [facility]   PATIENT NAME: Rhonda Griffith    MR#:  [id number]  DATE OF BIRTH:  12/25/1945  DATE OF ADMISSION:  04/10/2019  PRIMARY CARE PHYSICIAN: [name], Todd [place], MD   REQUESTING/REFERRING PHYSICIAN: Dorothea GlassmanPaul Malinda, MD CHIEF COMPLAINT:   Chief Complaint  Patient presents with  . Hip Pain    Right   . Fall    HISTORY OF PRESENT ILLNESS:  Rhonda Griffith  is a 73 y.o. female with a known history of COPD, peripheral artery disease, RLS, history of CVA in 1991, atrial fibrillation, mitral valve stenosis, aortic valve stenosis, CKD stage I.  Patient presented to the emergency room after a fall at home complaining of pain in her right hip reporting severe pain and inability to move her right hip.  She reports increased weakness over the last 2 to 3 days with a similar fall on yesterday which resulted in being seen in the emergency room as well.  She has a history of CVA with resulting weakness in her lower extremities.  Patient uses a walker for ambulatory assistance.  She denies recent illness.  She denies chest pain, cough, fever, chills, nausea, vomiting.  Patient reports she has been more short of breath over the last 2 days.  Chest x-ray demonstrates no acute pulmonary disease.  Right hip x-ray shows right femoral neck fracture.  Dr. Ernest PineHooten with orthopedic surgery was consulted by the ED physician planning surgical intervention when patient is medically stable and INR is less than 1.3.  Patient is currently on Coumadin for history of CVA and atrial fibrillation as well as valvular stenosis.  We have admitted her to the hospitalist service for further management.  PAST MEDICAL HISTORY:   Past Medical History:  Diagnosis Date  . Atrial fibrillation, permanent    a. CHA2DS2VASc = 4-->coumadin;  b. 09/2014 Echo: EF 55-60%, normal wall motion, mild AI/AS, nl MV, mod dil LA, mildly dil RA, mild to mod TR, PASP 47mmHg.  [name]. Chronic  kidney disease, stage I   . CVA (cerebral infarction)    a. 04/2015 - anticoagulation switched from eliquis to xarelto to coumadin.  . Leukocytosis   . Pacemaker -Medtronic    a. implant for SSS  . PAD (peripheral artery disease) (HCC)    a. w/ left lower ext claudication s/p ABI's 04/2015 showing nl ABI on Right with abnl waveforms on left sugg of L SFA dzs.  [name]. Restless leg syndrome   . 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    PAST SURGICAL HISTORY:   Past Surgical History:  Procedure Laterality Date  . bladder injection    . botox bladder    . INSERT / REPLACE / REMOVE PACEMAKER     implant for SSS  . MELANOMA EXCISION     face  . MITRAL VALVE REPLACEMENT     s/p mechanical valve replaced with bovine valve 2010  . NASAL SINUS SURGERY    . PPM GENERATOR CHANGEOUT N/A 09/21/2018   Procedure: PPM GENERATOR CHANGEOUT;  Surgeon: Duke SalviaKlein, Steven C, MD;  [facility];  Service: Cardiovascular;  Laterality: N/A;    SOCIAL HISTORY:   Social History   Tobacco Use  . Smoking status: Former Smoker    Packs/day: 1.00    Years: 5.00    Pack years: 5.00    Types: Cigarettes    Quit date: 12/29/1970  Years since quitting: 48.3  . Smokeless tobacco: Never Used  Substance Use Topics  . Alcohol use: No    FAMILY HISTORY:   Family History  Family history unknown: Yes    DRUG ALLERGIES:   Allergies  Allergen Reactions  . Morphine Nausea And Vomiting  . Other Other (See Comments)    Several different statins.  . Statins Other (See Comments)    Several different statins.  . Tizanidine Palpitations  . Tape Other (See Comments)    Breaks her skin Allergic to plastic/latex tape. Only use paper tape on patient.  . Tapentadol Other (See Comments)  . Cephalexin Rash  . Latex Rash  . Sulfa Antibiotics Rash and Other (See Comments)    REVIEW OF SYSTEMS:   Review of Systems  Constitutional:  Negative for chills, fever and malaise/fatigue.  HENT: Negative for hearing loss, sinus pain, sore throat and tinnitus.   Eyes: Negative for blurred vision and double vision.  Respiratory: Positive for shortness of breath. Negative for cough and wheezing.   Cardiovascular: Negative for chest pain and leg swelling.  Gastrointestinal: Negative for abdominal pain, constipation, diarrhea, heartburn, nausea and vomiting.  Genitourinary: Negative for dysuria, flank pain and hematuria.  Musculoskeletal: Positive for falls. Negative for myalgias.  Neurological: Positive for weakness. Negative for dizziness, loss of consciousness and headaches.  Psychiatric/Behavioral: Negative.  Negative for depression.     MEDICATIONS AT HOME:   Prior to Admission medications   Medication Sig Start Date End Date Taking? Authorizing Provider  atorvastatin (LIPITOR) 80 MG tablet Take 80 mg by mouth every evening.    Yes [provider]  bisacodyl (DULCOLAX) 5 MG EC tablet Take 1 tablet (5 mg total) by mouth daily as needed (if taking the narcotic pain medication). 04/09/19 04/08/20 Yes Phineas SemenGoodman, Graydon, MD  cyclobenzaprine (FLEXERIL) 5 MG tablet Take 5 mg by mouth 2 (two) times daily as needed for muscle spasms.  08/24/18  Yes [provider]  diltiazem (CARDIZEM CD) 120 MG 24 hr capsule Take 1 capsule (120 mg total) by mouth daily. 09/06/16  Yes Gollan, Tollie Pizzaimothy J, MD  DULoxetine (CYMBALTA) 60 MG capsule Take 60 mg by mouth daily.    Yes [provider]  gabapentin (NEURONTIN) 100 MG capsule Take 100 mg by mouth as directed. Take one capsule (100mg ) twice a day, may gradually increase to three times a day if needed. 03/17/18  Yes [provider]  oxyCODONE-acetaminophen (PERCOCET) 5-325 MG tablet Take 1 tablet by mouth every 4 (four) hours as needed for severe pain. 04/09/19 04/08/20 Yes Phineas SemenGoodman, Graydon, MD  pantoprazole (PROTONIX) 40 MG tablet Take 40 mg by mouth daily.    Yes  [provider]  rOPINIRole (REQUIP) 2 MG tablet Take 2 mg by mouth at bedtime.  05/29/15  Yes [provider]  solifenacin (VESICARE) 10 MG tablet Take 10 mg by mouth daily.  04/03/18  Yes [provider]  warfarin (COUMADIN) 5 MG tablet Take 2.5-5 mg by mouth as directed. Take 2.5mg  on all days EXCEPT Tuesday. On Tuesday take 5mg .   Yes [provider]  albuterol (ACCUNEB) 1.25 MG/3ML nebulizer solution Take 1 ampule by nebulization every 6 (six) hours as needed for wheezing.    [provider]  albuterol (PROVENTIL HFA;VENTOLIN HFA) 108 (90 BASE) MCG/ACT inhaler Inhale 2 puffs into the lungs every 6 (six) hours as needed for wheezing.    [provider]  ascorbic acid (VITAMIN C) 500 MG tablet Take 500 mg by  mouth daily.    [provider]  Cholecalciferol (VITAMIN D3) 1000 UNITS CAPS Take 2,000 Units by mouth every 30 (thirty) days.     [provider]  diazepam (VALIUM) 5 MG tablet Take 5 mg by mouth every 12 (twelve) hours as needed for anxiety.    [provider]  diclofenac sodium (VOLTAREN) 1 % GEL Apply 2-4 g topically 2 (two) times daily as needed (for knee pain.).     [provider]  fluticasone (FLONASE) 50 MCG/ACT nasal spray Place 2 sprays into both nostrils daily.     [provider]  furosemide (LASIX) 20 MG tablet Take 20 mg by mouth daily as needed (fluid retention/swelling.).     [provider]  magnesium oxide (MAG-OX) 400 MG tablet Take 400 mg by mouth 2 (two) times daily.     [provider]  mirabegron ER (MYRBETRIQ) 50 MG TB24 tablet Take 50 mg by mouth daily. 09/02/16   [provider]  oxyCODONE-acetaminophen (PERCOCET) 5-325 MG tablet Take 0.5 tablets by mouth every 8 (eight) hours as needed for severe pain. 06/01/18 06/01/19  Sharyn Creamer, MD  potassium chloride (K-DUR) 10 MEQ tablet Take 1 tablet (10 mEq total) by mouth daily as needed. Take with  lasix Patient taking differently: Take 10 mEq by mouth daily as needed (with lasix (fluid retention/swelling)).  09/06/16   Antonieta Iba, MD  traMADol (ULTRAM) 50 MG tablet Take 50-100 mg by mouth every 6 (six) hours as needed (for pain.).    [provider]      VITAL SIGNS:  Blood pressure (!) 122/50, pulse 61, temperature 98.2 F (36.8 C), temperature source Oral, resp. rate 18, height  (1.753 m), weight 77.1 kg, SpO2 98 %.  PHYSICAL EXAMINATION:  Physical Exam  GENERAL:  73 y.o.-year-old patient lying in the bed with no acute distress.  EYES: Pupils equal, round, reactive to light and accommodation. No scleral icterus. Extraocular muscles intact.  HEENT: Head atraumatic, normocephalic. Oropharynx and nasopharynx clear.  NECK:  Supple, no jugular venous distention. No thyroid enlargement, no tenderness.  LUNGS: Normal breath sounds bilaterally, no wheezing, rales,rhonchi or crepitation. No use of accessory muscles of respiration.  CARDIOVASCULAR: Regular rate and rhythm, S1, S2 normal. No murmurs, rubs, or gallops.  ABDOMEN: Soft, nondistended, nontender. Bowel sounds present. No organomegaly or mass.  EXTREMITIES: No pedal edema, cyanosis, or clubbing. Pain with movement of Right Lower extremity NEUROLOGIC: Cranial nerves II through XII are intact. Muscle strength 5/5 in all extremities. Sensation intact. Gait not checked.  PSYCHIATRIC: The patient is alert and oriented x 3.  Normal affect and good eye contact. SKIN: No obvious rash, lesion, or ulcer.   LABORATORY PANEL:   CBC Recent Labs  Lab 04/10/19 2051  WBC 9.2  HGB 12.0  HCT 38.6  PLT 277   ------------------------------------------------------------------------------------------------------------------  Chemistries  Recent Labs  Lab 04/10/19 2051  NA 137  K 4.1  CL 104  CO2 26  GLUCOSE 98  BUN 20  CREATININE 1.07*  CALCIUM 10.0  AST 19  ALT 12  ALKPHOS 99  BILITOT 1.0    ------------------------------------------------------------------------------------------------------------------  Cardiac Enzymes No results for input(s): TROPONINI in the last 168 hours. ------------------------------------------------------------------------------------------------------------------  RADIOLOGY:  Dg Chest 1 View  Result Date: 04/09/2019 CLINICAL DATA:  Right hip pain after fall. EXAM: CHEST  1 VIEW COMPARISON:  Radiographs of March 24, 2018. FINDINGS: Stable cardiomegaly. Status post cardiac valve repair. Left-sided pacemaker is unchanged in position. Atherosclerosis of  thoracic aorta is noted. Both lungs are clear. The visualized skeletal structures are unremarkable. IMPRESSION: No active disease. Aortic Atherosclerosis (ICD10-I70.0). Electronically Signed   By: Lupita RaiderJames  Green Jr M.D.   On: 04/09/2019 16:14   Ct Head Wo Contrast  Result Date: 04/10/2019 CLINICAL DATA:  73 y/o  F; fall with right hip pain. EXAM: CT HEAD WITHOUT CONTRAST TECHNIQUE: Contiguous axial images were obtained from the base of the skull through the vertex without intravenous contrast. COMPARISON:  08/03/2018 CT head. FINDINGS: Brain: No evidence of acute infarction, hemorrhage, extra-axial collection or mass lesion/mass effect. Stable chronic infarcts in right basal ganglia, left thalamus, and right cerebellum. Stable chronic hydrocephalus. Stable chronic microvascular ischemic changes of white matter and volume loss of the brain. Vascular: Calcific atherosclerosis of internal carotid arteries. No hyperdense vessel. Skull: Normal. Negative for fracture or focal lesion. Sinuses/Orbits: No acute finding. Other: None. IMPRESSION: 1. No acute intracranial abnormality identified. 2. Stable chronic microvascular ischemic changes and volume loss of the brain. 3. Stable chronic infarcts in right basal ganglia, left thalamus, and right cerebellum. 4. Stable chronic hydrocephalus. Electronically Signed   By: Mitzi HansenLance   Furusawa-Stratton M.D.   On: 04/10/2019 22:13   Ct Hip Right Wo Contrast  Result Date: 04/09/2019 CLINICAL DATA:  Right hip pain status post fall EXAM: CT OF THE RIGHT HIP WITHOUT CONTRAST TECHNIQUE: Multidetector CT imaging of the right hip was performed according to the standard protocol. Multiplanar CT image reconstructions were also generated. COMPARISON:  None. FINDINGS: Bones/Joint/Cartilage No fracture or dislocation. Normal alignment. No joint effusion. Mild osteoarthritis of the right hip. Heterogeneous marrow of the proximal femoral diaphysis which may reflect red marrow or hemorrhage. Ligaments Ligaments are suboptimally evaluated by CT. Muscles and Tendons Muscles are normal.  No intramuscular fluid collection. Soft tissue No fluid collection or hematoma.  No soft tissue mass. IMPRESSION: No acute osseous injury of the right hip. Heterogeneous marrow of the proximal femoral diaphysis which may reflect red marrow or hemorrhage. Recommend further evaluation with MR of the femur. Electronically Signed   By: Elige KoHetal  Patel   On: 04/09/2019 17:10   Dg Chest Portable 1 View  Result Date: 04/10/2019 CLINICAL DATA:  Status post fall with right hip pain. EXAM: PORTABLE CHEST 1 VIEW COMPARISON:  April 09, 2019 FINDINGS: The heart size and mediastinal contours are stable. Cardiac valvular replacement ring and cardiac pacemaker are unchanged. The heart size is enlarged. The lungs are hyperinflated. There is no focal infiltrate, pulmonary edema, or pleural effusion. The visualized skeletal structures are unremarkable. IMPRESSION: No active cardiopulmonary disease. Electronically Signed   By: Sherian ReinWei-Chen  Lin M.D.   On: 04/10/2019 21:48   Dg Hip Unilat W Or Wo Pelvis 2-3 Views Right  Result Date: 04/10/2019 CLINICAL DATA:  Status post fall with right hip pain. EXAM: DG HIP (WITH OR WITHOUT PELVIS) 2-3V RIGHT COMPARISON:  April 09, 2019 FINDINGS: There is deformity of the proximal right femur in the femoral neck  region suspicious for fracture. No other acute fracture dislocation is identified. IMPRESSION: There is deformity of the proximal right femur in the femoral neck region suspicious for fracture. Electronically Signed   By: Sherian ReinWei-Chen  Lin M.D.   On: 04/10/2019 21:47   Dg Hip Unilat W Or Wo Pelvis 2-3 Views Right  Result Date: 04/09/2019 CLINICAL DATA:  Right hip pain after fall. EXAM: DG HIP (WITH OR WITHOUT PELVIS) 2-3V RIGHT COMPARISON:  None. FINDINGS: There is no evidence of hip fracture or dislocation. There is no  evidence of arthropathy or other focal bone abnormality. IMPRESSION: Negative. Electronically Signed   By: Marijo Conception M.D.   On: 04/09/2019 16:12      IMPRESSION AND PLAN:   1.  right hip fracture - Dr. Marry Guan has been consulted by the ED physician planning surgical intervention when patient is medically stable with INR less than 1.3. - Coumadin is being held -We will repeat INR in the a.m. - Pain is being controlled with analgesic - Social service has been consulted for rehab placement at the time of discharge  2.  Atrial fibrillation - Also with a history of mitral valve and aortic valve stenosis as well as pacemaker placement - Patient has been on Coumadin since 1991 at the time of CVA - Will hold Coumadin for surgical intervention with right femoral neck fracture -Patient is on telemetry monitoring -We will consult cardiology for recommendations as well  3.  History of CVA - With residual lower extremity weakness since 1991 - Physical therapy and Occupational Therapy have been consulted for rehabilitation assistance - Telemetry monitoring -CBC and BMP in the a.m. - Patient will need to be monitored closely given her history of atrial fibrillation as well as valvular heart disease particularly during time where Coumadin is being held for surgical intervention.  4.  CKD - Will monitor renal function closely. -We will repeat BMP in the a.m. and continue to pay  close attention to BUN and creatinine. -Current BUN is 20 with creatinine 1.07  DVT prophylaxis with SCDs and PPI prophylaxis initiated    All the records are reviewed and case discussed with ED provider. The plan of care was discussed in details with the patient (and family). I answered all questions. The patient agreed to proceed with the above mentioned plan. Further management will depend upon hospital course.   CODE STATUS: Full code  TOTAL TIME TAKING CARE OF THIS PATIENT: 45 minutes.    [place] on 04/11/2019 at 2:51 AM  Pager - [phone]  After 6pm go to [email or web address] - [employer] [facility]  Office  [phone]  CC: Primary care physician; Shapely-Quinn, Okey Regal, MD   Note: This dictation was prepared with Dragon dictation along with smaller phrase technology. Any transcriptional errors that result from this process are unintentional.

## 2019-04-11 NOTE — Progress Notes (Signed)
Initial Nutrition Assessment  RD working remotely.  DOCUMENTATION CODES:   Not applicable  INTERVENTION:   - Ensure Enlive po BID, each supplement provides 350 kcal and 20 grams of protein (chocolate flavor)  - Discussed the importance of adequate PO intake in post-op healing  NUTRITION DIAGNOSIS:   Increased nutrient needs related to chronic illness (COPD) as evidenced by estimated needs.  GOAL:   Patient will meet greater than or equal to 90% of their needs  MONITOR:   PO intake, Labs, I & O's, Weight trends, Supplement acceptance  REASON FOR ASSESSMENT:   Malnutrition Screening Tool, Consult Hip fracture protocol  ASSESSMENT:   73 year old female who presented to the ED on 7/15 after a fall. PMH of COPD, PAD, history of CVA in 1991, atrial fibrillation, mitral valve stenosis, aortic valve stenosis, CKD stage I. X-ray showing right-sided femoral neck fracture.  Plan is for surgical intervention when pt is medically stable and INR is less than 1.3.  Spoke with pt via phone call to room. Pt in good spirits, telling RD she just ordered some more breakfast food to have for lunch. Pt states she ate about 50% of her breakfast this AM which included pancakes.  Pt states that at home, she eats 2 full meals daily, breakfast and supper. Pt states she does not normally eat lunch but may have a snack in the middle of the day. Pt reports that her son-in-law cooks large dinners each night.  Breakfast: 2 toaster strudels with icing, Sprite to drink Lunch: skips or has a small snack Dinner: chicken with stuffing, potatoes, peas OR steak with home fries and corn  Pt denies any weight loss. Pt reports that she does not check her weight "like I used to," but that her clothes are fitting her the same. Weights over the last year appear stable with fluctuations between 72-77 kg. Weight of 170 lbs on admission appears stated rather than measured.  Pt denies any issues chewing or swallowing  and denies any N/V. Pt reports that she has regular bowel movements at home.  Pt amenable to RD ordering an oral nutrition supplement to aid pt in meeting kcal and protein needs prior to surgery and to help with post-op healing. Pt prefers chocolate flavor.  Medications reviewed and include: cholecalciferol, magnesium oxide, Protonix, vitamin C 500 mg daily  Labs reviewed.   NUTRITION - FOCUSED PHYSICAL EXAM:  Unable to complete at this time. RD working remotely.  Diet Order:   Diet Order            Diet Heart Room service appropriate? Yes; Fluid consistency: Thin  Diet effective now              EDUCATION NEEDS:   Education needs have been addressed  Skin:  Skin Assessment: Reviewed RN Assessment  Last BM:  04/09/19  Height:   Ht Readings from Last 1 Encounters:  04/10/19 5\' 9"  (1.753 m)    Weight:   Wt Readings from Last 1 Encounters:  04/10/19 77.1 kg    Ideal Body Weight:  65.9 kg  BMI:  Body mass index is 25.1 kg/m.  Estimated Nutritional Needs:   Kcal:  1650-1850  Protein:  80-95 grams  Fluid:  1.7-1.9 L    Gaynell Face, MS, RD, LDN Inpatient Clinical Dietitian Pager: [phone] Weekend/After Hours: [phone]

## 2019-04-11 NOTE — Progress Notes (Signed)
PT Cancellation Note  Patient Details Name: Aleeta Schmaltz MRN: [id number] DOB: April 08, 1946   Cancelled Treatment:    Reason Eval/Treat Not Completed: Medical issues which prohibited therapy(Consult received and chart reviewed. Per notes, patient noted with acute hip fracture, recommended/planned for surgical repair.  Awaiting normalization of INR values.  Contraindicated for mobility at this time, and will require new orders post-op.  Will complete orders at this time; please re-consult as medically appropriate post-op.)   Mckinley Olheiser H. Owens Shark, PT, DPT, NCS 04/11/19, 7:32 AM [phone]

## 2019-04-11 NOTE — Consult Note (Signed)
ORTHOPAEDIC CONSULTATION  PATIENT NAME: Rhonda Griffith DOB: 09/08/1946  MRN: [id number]  REQUESTING PHYSICIAN: Pearletha AlfredSeals, Angela H, NP  Chief Complaint: Right hip pain  HPI: Rhonda Griffith is a 73 y.o. female who fell in her bathroom yesterday and landed on her right hip and side. She complains of  Severe right hip pain and was unable to stand or bear weight on the right lower extremity due to the right hip pain. She denied any other injuries. She denied any loss of consciousness. She did report a 2-3 day history of weakness and actually fell on the day prior to admission. She was evaluated in the Emergency Department on 04/09/2019 and a hip CT scan was negative for fracture at that time.  Prior to the fall she was ambulating with a walker.  Past Medical History:  Diagnosis Date  . Atrial fibrillation, permanent    a. CHA2DS2VASc = 4-->coumadin;  b. 09/2014 Echo: EF 55-60%, normal wall motion, mild AI/AS, nl MV, mod dil LA, mildly dil RA, mild to mod TR, PASP 47mmHg.  [name]. Chronic kidney disease, stage I   . CVA (cerebral infarction)    a. 04/2015 - anticoagulation switched from eliquis to xarelto to coumadin.  . Leukocytosis   . Pacemaker -Medtronic    a. implant for SSS  . PAD (peripheral artery disease) (HCC)    a. w/ left lower ext claudication s/p ABI's 04/2015 showing nl ABI on Right with abnl waveforms on left sugg of L SFA dzs.  [name]. Restless leg syndrome   . 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   Past Surgical History:  Procedure Laterality Date  . bladder injection    . botox bladder    . INSERT / REPLACE / REMOVE PACEMAKER     implant for SSS  . MELANOMA EXCISION     face  . MITRAL VALVE REPLACEMENT     s/p mechanical valve replaced with bovine valve 2010  . NASAL SINUS SURGERY    . PPM GENERATOR CHANGEOUT N/A 09/21/2018   Procedure: PPM GENERATOR CHANGEOUT;  Surgeon: Duke SalviaKlein, Steven C,  MD;  [facility];  Service: Cardiovascular;  Laterality: N/A;   Social History   Socioeconomic History  . Marital status: Married    Spouse name: Not on file  . Number of children: Not on file  . Years of education: Not on file  . Highest education level: Not on file  Occupational History  . Not on file  Social Needs  . Financial resource strain: Not on file  . Food insecurity    Worry: Not on file    Inability: Not on file  . Transportation needs    Medical: Not on file    Non-medical: Not on file  Tobacco Use  . Smoking status: Former Smoker    Packs/day: 1.00    Years: 5.00    Pack years: 5.00    Types: Cigarettes    Quit date: 12/29/1970    Years since quitting: 48.3  . Smokeless tobacco: Never Used  Substance and Sexual Activity  . Alcohol use: No  . Drug use: No  . Sexual activity: Not on file  Lifestyle  . Physical activity    Days per week: Not on file    Minutes per session: Not on file  . Stress: Not on file  Relationships  . Social Musicianconnections    Talks on phone: Not  on file    Gets together: Not on file    Attends religious service: Not on file    Active member of club or organization: Not on file    Attends meetings of clubs or organizations: Not on file    Relationship status: Not on file  Other Topics Concern  . Not on file  Social History Narrative   Only use paper tape on patient.   Family History  Family history unknown: Yes   Allergies  Allergen Reactions  . Morphine Nausea And Vomiting  . Other Other (See Comments)    Several different statins.  . Statins Other (See Comments)    Several different statins.  . Tizanidine Palpitations  . Tape Other (See Comments)    Breaks her skin Allergic to plastic/latex tape. Only use paper tape on patient.  . Tapentadol Other (See Comments)  . Cephalexin Rash  . Latex Rash  . Sulfa Antibiotics Rash and Other (See Comments)   Prior to Admission medications   Medication Sig Start  Date End Date Taking? Authorizing Provider  atorvastatin (LIPITOR) 80 MG tablet Take 80 mg by mouth every evening.    Yes [provider]  bisacodyl (DULCOLAX) 5 MG EC tablet Take 1 tablet (5 mg total) by mouth daily as needed (if taking the narcotic pain medication). 04/09/19 04/08/20 Yes Phineas SemenGoodman, Graydon, MD  cyclobenzaprine (FLEXERIL) 5 MG tablet Take 5 mg by mouth 2 (two) times daily as needed for muscle spasms.  08/24/18  Yes [provider]  diltiazem (CARDIZEM CD) 120 MG 24 hr capsule Take 1 capsule (120 mg total) by mouth daily. 09/06/16  Yes Gollan, Tollie Pizzaimothy J, MD  DULoxetine (CYMBALTA) 60 MG capsule Take 60 mg by mouth daily.    Yes [provider]  gabapentin (NEURONTIN) 100 MG capsule Take 100 mg by mouth as directed. Take one capsule (100mg ) twice a day, may gradually increase to three times a day if needed. 03/17/18  Yes [provider]  oxyCODONE-acetaminophen (PERCOCET) 5-325 MG tablet Take 1 tablet by mouth every 4 (four) hours as needed for severe pain. 04/09/19 04/08/20 Yes Phineas SemenGoodman, Graydon, MD  pantoprazole (PROTONIX) 40 MG tablet Take 40 mg by mouth daily.    Yes [provider]  rOPINIRole (REQUIP) 2 MG tablet Take 2 mg by mouth at bedtime.  05/29/15  Yes [provider]  solifenacin (VESICARE) 10 MG tablet Take 10 mg by mouth daily.  04/03/18  Yes [provider]  warfarin (COUMADIN) 5 MG tablet Take 2.5-5 mg by mouth as directed. Take 2.5mg  on all days EXCEPT Tuesday. On Tuesday take 5mg .   Yes [provider]  albuterol (ACCUNEB) 1.25 MG/3ML nebulizer solution Take 1 ampule by nebulization every 6 (six) hours as needed for wheezing.    [provider]  albuterol (PROVENTIL HFA;VENTOLIN HFA) 108 (90 BASE) MCG/ACT inhaler Inhale 2 puffs into the lungs every 6 (six) hours as needed for wheezing.    [provider]  ascorbic acid (VITAMIN C) 500 MG tablet Take 500 mg by mouth daily.    [provider]  Cholecalciferol (VITAMIN D3) 1000 UNITS CAPS Take 2,000 Units by mouth every 30 (thirty) days.     [provider]  diazepam (VALIUM) 5 MG tablet Take 5 mg by mouth every 12 (twelve) hours as needed for anxiety.    [provider]  diclofenac sodium (VOLTAREN) 1 % GEL Apply 2-4 g topically 2 (two) times daily as needed (for knee pain.).  [provider]  fluticasone (FLONASE) 50 MCG/ACT nasal spray Place 2 sprays into both nostrils daily.     [provider]  furosemide (LASIX) 20 MG tablet Take 20 mg by mouth daily as needed (fluid retention/swelling.).     [provider]  magnesium oxide (MAG-OX) 400 MG tablet Take 400 mg by mouth 2 (two) times daily.     [provider]  mirabegron ER (MYRBETRIQ) 50 MG TB24 tablet Take 50 mg by mouth daily. 09/02/16   [provider]  oxyCODONE-acetaminophen (PERCOCET) 5-325 MG tablet Take 0.5 tablets by mouth every 8 (eight) hours as needed for severe pain. 06/01/18 06/01/19  Delman Kitten, MD  potassium chloride (K-DUR) 10 MEQ tablet Take 1 tablet (10 mEq total) by mouth daily as needed. Take with lasix Patient taking differently: Take 10 mEq by mouth daily as needed (with lasix (fluid retention/swelling)).  09/06/16   Minna Merritts, MD  traMADol (ULTRAM) 50 MG tablet Take 50-100 mg by mouth every 6 (six) hours as needed (for pain.).    [provider]   Dg Chest 1 View  Result Date: 04/09/2019 CLINICAL DATA:  Right hip pain after fall. EXAM: CHEST  1 VIEW COMPARISON:  Radiographs of March 24, 2018. FINDINGS: Stable cardiomegaly. Status post cardiac valve repair. Left-sided pacemaker is unchanged in position. Atherosclerosis of thoracic aorta is noted. Both lungs are clear. The visualized skeletal structures are unremarkable. IMPRESSION: No active disease. Aortic Atherosclerosis (ICD10-I70.0). Electronically Signed   By: Marijo Conception M.D.   On: 04/09/2019 16:14   Ct Head  Wo Contrast  Result Date: 04/10/2019 CLINICAL DATA:  73 y/o  F; fall with right hip pain. EXAM: CT HEAD WITHOUT CONTRAST TECHNIQUE: Contiguous axial images were obtained from the base of the skull through the vertex without intravenous contrast. COMPARISON:  08/03/2018 CT head. FINDINGS: Brain: No evidence of acute infarction, hemorrhage, extra-axial collection or mass lesion/mass effect. Stable chronic infarcts in right basal ganglia, left thalamus, and right cerebellum. Stable chronic hydrocephalus. Stable chronic microvascular ischemic changes of white matter and volume loss of the brain. Vascular: Calcific atherosclerosis of internal carotid arteries. No hyperdense vessel. Skull: Normal. Negative for fracture or focal lesion. Sinuses/Orbits: No acute finding. Other: None. IMPRESSION: 1. No acute intracranial abnormality identified. 2. Stable chronic microvascular ischemic changes and volume loss of the brain. 3. Stable chronic infarcts in right basal ganglia, left thalamus, and right cerebellum. 4. Stable chronic hydrocephalus. Electronically Signed   By: Kristine Garbe M.D.   On: 04/10/2019 22:13   Ct Hip Right Wo Contrast  Result Date: 04/09/2019 CLINICAL DATA:  Right hip pain status post fall EXAM: CT OF THE RIGHT HIP WITHOUT CONTRAST TECHNIQUE: Multidetector CT imaging of the right hip was performed according to the standard protocol. Multiplanar CT image reconstructions were also generated. COMPARISON:  None. FINDINGS: Bones/Joint/Cartilage No fracture or dislocation. Normal alignment. No joint effusion. Mild osteoarthritis of the right hip. Heterogeneous marrow of the proximal femoral diaphysis which may reflect red marrow or hemorrhage. Ligaments Ligaments are suboptimally evaluated by CT. Muscles and Tendons Muscles are normal.  No intramuscular fluid collection. Soft tissue No fluid collection or hematoma.  No soft tissue mass. IMPRESSION: No acute osseous injury of the right hip.  Heterogeneous marrow of the proximal femoral diaphysis which may reflect red marrow or hemorrhage. Recommend further evaluation with MR of the femur. Electronically Signed   By: Kathreen Devoid   On: 04/09/2019 17:10   Dg Chest Portable 1  View  Result Date: 04/10/2019 CLINICAL DATA:  Status post fall with right hip pain. EXAM: PORTABLE CHEST 1 VIEW COMPARISON:  April 09, 2019 FINDINGS: The heart size and mediastinal contours are stable. Cardiac valvular replacement ring and cardiac pacemaker are unchanged. The heart size is enlarged. The lungs are hyperinflated. There is no focal infiltrate, pulmonary edema, or pleural effusion. The visualized skeletal structures are unremarkable. IMPRESSION: No active cardiopulmonary disease. Electronically Signed   By: Sherian ReinWei-Chen  Lin M.D.   On: 04/10/2019 21:48   Dg Hip Unilat W Or Wo Pelvis 2-3 Views Right  Result Date: 04/10/2019 CLINICAL DATA:  Status post fall with right hip pain. EXAM: DG HIP (WITH OR WITHOUT PELVIS) 2-3V RIGHT COMPARISON:  April 09, 2019 FINDINGS: There is deformity of the proximal right femur in the femoral neck region suspicious for fracture. No other acute fracture dislocation is identified. IMPRESSION: There is deformity of the proximal right femur in the femoral neck region suspicious for fracture. Electronically Signed   By: Sherian ReinWei-Chen  Lin M.D.   On: 04/10/2019 21:47   Dg Hip Unilat W Or Wo Pelvis 2-3 Views Right  Result Date: 04/09/2019 CLINICAL DATA:  Right hip pain after fall. EXAM: DG HIP (WITH OR WITHOUT PELVIS) 2-3V RIGHT COMPARISON:  None. FINDINGS: There is no evidence of hip fracture or dislocation. There is no evidence of arthropathy or other focal bone abnormality. IMPRESSION: Negative. Electronically Signed   By: Lupita RaiderJames  Green Jr M.D.   On: 04/09/2019 16:12    Positive ROS: All other systems have been reviewed and were otherwise negative with the exception of those mentioned in the HPI and as above.  Physical Exam: General: Well  developed, well nourished female seen in no acute distress. HEENT: Atraumatic and normocephalic. Sclera are clear. Extraocular motion is intact. Oropharynx is clear with moist mucosa. Neck: Supple, nontender, good range of motion. No JVD or carotid bruits. Lungs: Clear to auscultation bilaterally. Cardiovascular: Regular rate and rhythm with normal S1 and S2. 2-3/6 murmur. No gallops or rubs. Pedal pulses are palpable bilaterally. Homans test is negative bilaterally. No significant pretibial or ankle edema. Abdomen: Soft, nontender, and nondistended. Bowel sounds are present. Skin: No lesions in the area of chief complaint Neurologic: Awake, alert, and oriented. Sensory function is grossly intact. Motor strength is felt to be 5 over 5 bilaterally. No clonus or tremor. Good motor coordination. Lymphatic: No axillary or cervical lymphadenopathy  MUSCULOSKELETAL: Examination of the right lower extremity demonstrates shortening and slight rotation. Pain is elicited with attempted range of motion of the right hip. No tenderness to palpation of the right knee. No knee effusion. No tenderness or swelling about the right ankle.  Assessment: Right femoral neck fracture  Plan: The findings were discussed in detail with the patient. Recommendation was made for right hip hemiarthroplasty. The usual perioperative course was discussed. The risks and benefits of surgical intervention were reviewed. The patient expressed understanding of the risks and benefits and agreed with plans for surgical intervention.   The surgical site was signed as per the "right site surgery" protocol.   The patient was anticoagulated with coumadin due to her atrial fibrillation and valvular heart disease. INR this morning was 2.8. I would like to see normalization of the INR before surgery with a goal of 1.3 (to allow for the possibility of regional anesthesia and lower risk of blood loss).  Kionna Brier P. Angie FavaHooten, Jr. M.D.

## 2019-04-11 NOTE — ED Notes (Signed)
ED TO INPATIENT HANDOFF REPORT  ED Nurse Name and Phone #:  Reuel BoomDaniel [phone]  S Name/Age/Gender Rhonda Griffith 73 y.o. female Room/Bed: ED18A/ED18A  Code Status   Code Status: Full Code  Home/SNF/Other Home Patient oriented to: self, place, time and situation Is this baseline? Yes   Triage Complete: Triage complete  Chief Complaint Hip pain  Triage Note [facility], pt fell this evening in her bathroom denies hitting her head, reports right hip pain. Pt is awake, alert and oriented, per EMS pt was seen in ER yesterday due to right side weakness, history of stroke with deficit in left side, pt talks in complete sentences no respiratory distress noted    Allergies Allergies  Allergen Reactions  . Morphine Nausea And Vomiting  . Other Other (See Comments)    Several different statins.  . Statins Other (See Comments)    Several different statins.  . Tizanidine Palpitations  . Tape Other (See Comments)    Breaks her skin Allergic to plastic/latex tape. Only use paper tape on patient.  . Tapentadol Other (See Comments)  . Cephalexin Rash  . Latex Rash  . Sulfa Antibiotics Rash and Other (See Comments)    Level of Care/Admitting Diagnosis ED Disposition    ED Disposition Condition Comment   Admit  [facility] [[id number]]  Level of Care: Med-Surg [16]  Covid Evaluation: Asymptomatic Screening Protocol (No Symptoms)  Diagnosis: Closed right hip fracture, initial encounter [facility](HCC) [[id number]][[id number]]  Admitting Physician: Pearletha AlfredSEALS, ANGELA H [[id number]]  Attending Physician: Pearletha AlfredSEALS, ANGELA H [[id number]]  Estimated length of stay: past midnight tomorrow  Certification:: I certify this patient will need inpatient services for at least 2 midnights  PT Class (Do Not Modify): Inpatient [101]  PT Acc Code (Do Not Modify): Private [1]       B Medical/Surgery History Past Medical History:  Diagnosis Date  .  Atrial fibrillation, permanent    a. CHA2DS2VASc = 4-->coumadin;  b. 09/2014 Echo: EF 55-60%, normal wall motion, mild AI/AS, nl MV, mod dil LA, mildly dil RA, mild to mod TR, PASP 47mmHg.  [name]. Chronic kidney disease, stage I   . CVA (cerebral infarction)    a. 04/2015 - anticoagulation switched from eliquis to xarelto to coumadin.  . Leukocytosis   . Pacemaker -Medtronic    a. implant for SSS  . PAD (peripheral artery disease) (HCC)    a. w/ left lower ext claudication s/p ABI's 04/2015 showing nl ABI on Right with abnl waveforms on left sugg of L SFA dzs.  [name]. Restless leg syndrome   . 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   Past Surgical History:  Procedure Laterality Date  . bladder injection    . botox bladder    . INSERT / REPLACE / REMOVE PACEMAKER     implant for SSS  . MELANOMA EXCISION     face  . MITRAL VALVE REPLACEMENT     s/p mechanical valve replaced with bovine valve 2010  . NASAL SINUS SURGERY    . PPM GENERATOR CHANGEOUT N/A 09/21/2018   Procedure: PPM GENERATOR CHANGEOUT;  Surgeon: Duke SalviaKlein, Steven C, MD;  [facility];  Service: Cardiovascular;  Laterality: N/A;     A IV Location/Drains/Wounds Patient Lines/Drains/Airways Status   Active Line/Drains/Airways    Name:   Placement date:   Placement  time:   Site:   Days:   Peripheral IV 04/10/19 Right Antecubital   04/10/19    2055    Antecubital   1          Intake/Output Last 24 hours No intake or output data in the 24 hours ending 04/11/19 0056  Labs/Imaging Results for orders placed or performed during the hospital encounter of 04/10/19 (from the past 48 hour(s))  Comprehensive metabolic panel     Status: Abnormal   Collection Time: 04/10/19  8:51 PM  Result Value Ref Range   Sodium 137 135 - 145 mmol/L   Potassium 4.1 3.5 - 5.1 mmol/L   Chloride 104 98 - 111 mmol/L   CO2 26 22 - 32 mmol/L   Glucose, Bld 98 70 - 99  mg/dL   BUN 20 8 - 23 mg/dL   Creatinine, Ser 1.61 (H) 0.44 - 1.00 mg/dL   Calcium 09.6 8.9 - 04.5 mg/dL   Total Protein 7.1 6.5 - 8.1 g/dL   Albumin 4.3 3.5 - 5.0 g/dL   AST 19 15 - 41 U/L   ALT 12 0 - 44 U/L   Alkaline Phosphatase 99 38 - 126 U/L   Total Bilirubin 1.0 0.3 - 1.2 mg/dL   GFR calc non Af Amer 51 (L) >60 mL/min   GFR calc Af Amer 59 (L) >60 mL/min   Anion gap 7 5 - 15    Comment: Performed at [facility], [address] [place], [place] [address]  CBC with Differential     Status: None   Collection Time: 04/10/19  8:51 PM  Result Value Ref Range   WBC 9.2 4.0 - 10.5 K/uL   RBC 3.94 3.87 - 5.11 MIL/uL   Hemoglobin 12.0 12.0 - 15.0 g/dL   HCT 19.1 47.8 - 29.5 %   MCV 98.0 80.0 - 100.0 fL   MCH 30.5 26.0 - 34.0 pg   MCHC 31.1 30.0 - 36.0 g/dL   RDW 62.1 30.8 - 65.7 %   Platelets 277 150 - 400 K/uL   nRBC 0.0 0.0 - 0.2 %   Neutrophils Relative % 72 %   Neutro Abs 6.8 1.7 - 7.7 K/uL   Lymphocytes Relative 15 %   Lymphs Abs 1.3 0.7 - 4.0 K/uL   Monocytes Relative 8 %   Monocytes Absolute 0.8 0.1 - 1.0 K/uL   Eosinophils Relative 3 %   Eosinophils Absolute 0.2 0.0 - 0.5 K/uL   Basophils Relative 1 %   Basophils Absolute 0.1 0.0 - 0.1 K/uL   Immature Granulocytes 1 %   Abs Immature Granulocytes 0.05 0.00 - 0.07 K/uL    Comment: Performed at [facility], [address] [place], [place] [address]  Protime-INR     Status: Abnormal   Collection Time: 04/10/19  8:51 PM  Result Value Ref Range   Prothrombin Time 26.0 (H) 11.4 - 15.2 seconds   INR 2.4 (H) 0.8 - 1.2    Comment: (NOTE) INR goal varies based on device and disease states. Performed at [facility], [address] [place], [place] [address]   SARS Coronavirus 2 (CEPHEID- Performed in [facility] hospital lab), Hosp Order     Status: None   Collection Time: 04/10/19 11:19 PM   Specimen: Nasopharyngeal Swab  Result Value Ref Range   SARS Coronavirus 2 NEGATIVE NEGATIVE     Comment: (NOTE) If result is NEGATIVE SARS-CoV-2 target nucleic acids are NOT DETECTED. The SARS-CoV-2 RNA is  generally detectable in upper and lower  respiratory specimens during the acute phase of infection. The lowest  concentration of SARS-CoV-2 viral copies this assay can detect is 250  copies / mL. A negative result does not preclude SARS-CoV-2 infection  and should not be used as the sole basis for treatment or other  patient management decisions.  A negative result may occur with  improper specimen collection / handling, submission of specimen other  than nasopharyngeal swab, presence of viral mutation(s) within the  areas targeted by this assay, and inadequate number of viral copies  (<250 copies / mL). A negative result must be combined with clinical  observations, patient history, and epidemiological information. If result is POSITIVE SARS-CoV-2 target nucleic acids are DETECTED. The SARS-CoV-2 RNA is generally detectable in upper and lower  respiratory specimens dur ing the acute phase of infection.  Positive  results are indicative of active infection with SARS-CoV-2.  Clinical  correlation with patient history and other diagnostic information is  necessary to determine patient infection status.  Positive results do  not rule out bacterial infection or co-infection with other viruses. If result is PRESUMPTIVE POSTIVE SARS-CoV-2 nucleic acids MAY BE PRESENT.   A presumptive positive result was obtained on the submitted specimen  and confirmed on repeat testing.  While 2019 novel coronavirus  (SARS-CoV-2) nucleic acids may be present in the submitted sample  additional confirmatory testing may be necessary for epidemiological  and / or clinical management purposes  to differentiate between  SARS-CoV-2 and other Sarbecovirus currently known to infect humans.  If clinically indicated additional testing with an alternate test  methodology [phone]) is advised. The  SARS-CoV-2 RNA is generally  detectable in upper and lower respiratory sp ecimens during the acute  phase of infection. The expected result is Negative. Fact Sheet for Patients:  BoilerBrush.com.cyhttps://www.fda.gov/media/136312/download Fact Sheet for Healthcare Providers: https://pope.com/https://www.fda.gov/media/136313/download This test is not yet approved or cleared by the [place] FDA and has been authorized for detection and/or diagnosis of SARS-CoV-2 by FDA under an Emergency Use Authorization (EUA).  This EUA will remain in effect (meaning this test can be used) for the duration of the COVID-19 declaration under Section 564(b)(1) of the Act, 21 U.S.C. section 360bbb-3(b)(1), unless the authorization is terminated or revoked sooner. Performed at [facility], [address] [place], [place] [address]    Dg Chest 1 View  Result Date: 04/09/2019 CLINICAL DATA:  Right hip pain after fall. EXAM: CHEST  1 VIEW COMPARISON:  Radiographs of March 24, 2018. FINDINGS: Stable cardiomegaly. Status post cardiac valve repair. Left-sided pacemaker is unchanged in position. Atherosclerosis of thoracic aorta is noted. Both lungs are clear. The visualized skeletal structures are unremarkable. IMPRESSION: No active disease. Aortic Atherosclerosis (ICD10-I70.0). Electronically Signed   By: Lupita RaiderJames  Green Jr M.D.   On: 04/09/2019 16:14   Ct Head Wo Contrast  Result Date: 04/10/2019 CLINICAL DATA:  73 y/o  F; fall with right hip pain. EXAM: CT HEAD WITHOUT CONTRAST TECHNIQUE: Contiguous axial images were obtained from the base of the skull through the vertex without intravenous contrast. COMPARISON:  08/03/2018 CT head. FINDINGS: Brain: No evidence of acute infarction, hemorrhage, extra-axial collection or mass lesion/mass effect. Stable chronic infarcts in right basal ganglia, left thalamus, and right cerebellum. Stable chronic hydrocephalus. Stable chronic microvascular ischemic changes of white matter and volume loss of  the brain. Vascular: Calcific atherosclerosis of internal carotid arteries. No hyperdense vessel. Skull: Normal. Negative for fracture or focal lesion. Sinuses/Orbits: No  acute finding. Other: None. IMPRESSION: 1. No acute intracranial abnormality identified. 2. Stable chronic microvascular ischemic changes and volume loss of the brain. 3. Stable chronic infarcts in right basal ganglia, left thalamus, and right cerebellum. 4. Stable chronic hydrocephalus. Electronically Signed   By: Kristine Garbe M.D.   On: 04/10/2019 22:13   Ct Hip Right Wo Contrast  Result Date: 04/09/2019 CLINICAL DATA:  Right hip pain status post fall EXAM: CT OF THE RIGHT HIP WITHOUT CONTRAST TECHNIQUE: Multidetector CT imaging of the right hip was performed according to the standard protocol. Multiplanar CT image reconstructions were also generated. COMPARISON:  None. FINDINGS: Bones/Joint/Cartilage No fracture or dislocation. Normal alignment. No joint effusion. Mild osteoarthritis of the right hip. Heterogeneous marrow of the proximal femoral diaphysis which may reflect red marrow or hemorrhage. Ligaments Ligaments are suboptimally evaluated by CT. Muscles and Tendons Muscles are normal.  No intramuscular fluid collection. Soft tissue No fluid collection or hematoma.  No soft tissue mass. IMPRESSION: No acute osseous injury of the right hip. Heterogeneous marrow of the proximal femoral diaphysis which may reflect red marrow or hemorrhage. Recommend further evaluation with MR of the femur. Electronically Signed   By: Kathreen Devoid   On: 04/09/2019 17:10   Dg Chest Portable 1 View  Result Date: 04/10/2019 CLINICAL DATA:  Status post fall with right hip pain. EXAM: PORTABLE CHEST 1 VIEW COMPARISON:  April 09, 2019 FINDINGS: The heart size and mediastinal contours are stable. Cardiac valvular replacement ring and cardiac pacemaker are unchanged. The heart size is enlarged. The lungs are hyperinflated. There is no focal  infiltrate, pulmonary edema, or pleural effusion. The visualized skeletal structures are unremarkable. IMPRESSION: No active cardiopulmonary disease. Electronically Signed   By: Abelardo Diesel M.D.   On: 04/10/2019 21:48   Dg Hip Unilat W Or Wo Pelvis 2-3 Views Right  Result Date: 04/10/2019 CLINICAL DATA:  Status post fall with right hip pain. EXAM: DG HIP (WITH OR WITHOUT PELVIS) 2-3V RIGHT COMPARISON:  April 09, 2019 FINDINGS: There is deformity of the proximal right femur in the femoral neck region suspicious for fracture. No other acute fracture dislocation is identified. IMPRESSION: There is deformity of the proximal right femur in the femoral neck region suspicious for fracture. Electronically Signed   By: Abelardo Diesel M.D.   On: 04/10/2019 21:47   Dg Hip Unilat W Or Wo Pelvis 2-3 Views Right  Result Date: 04/09/2019 CLINICAL DATA:  Right hip pain after fall. EXAM: DG HIP (WITH OR WITHOUT PELVIS) 2-3V RIGHT COMPARISON:  None. FINDINGS: There is no evidence of hip fracture or dislocation. There is no evidence of arthropathy or other focal bone abnormality. IMPRESSION: Negative. Electronically Signed   By: Marijo Conception M.D.   On: 04/09/2019 16:12    Pending Labs Unresulted Labs (From admission, onward)    Start     Ordered   04/11/19 0500  CBC  Tomorrow morning,   STAT     04/10/19 2245   04/11/19 7106  Basic metabolic panel  Tomorrow morning,   STAT     04/10/19 2245   04/10/19 2246  Type and screen [place]  Once,   STAT    Comments: [place]    04/10/19 2245   04/10/19 2246  Protime-INR  Once,   STAT     04/10/19 2245   04/10/19 2246  APTT  Once,   STAT     04/10/19 2245   04/10/19 2244  Type and screen [facility]  Once,   STAT    Comments: [facility]    04/10/19 2243   04/10/19 2048  Urinalysis, Complete w Microscopic  ONCE - STAT,   STAT     04/10/19 2048           Vitals/Pain Today's Vitals   04/10/19 2215 04/10/19 2216 04/10/19 2315 04/11/19 0055  BP: (!) 174/72  (!) 156/57   Pulse: 70  69   Resp:   (!) 23   Temp:      TempSrc:      SpO2: 95%  94%   Weight:      Height:      PainSc:  8  10-Worst pain ever 5     Isolation Precautions No active isolations  Medications Medications  clindamycin (CLEOCIN) IVPB 600 mg (has no administration in time range)  diltiazem (CARDIZEM CD) 24 hr capsule 120 mg (has no administration in time range)  vitamin C (ASCORBIC ACID) tablet 500 mg (has no administration in time range)  atorvastatin (LIPITOR) tablet 80 mg (has no administration in time range)  Vitamin D3 CAPS 2,000 Units (has no administration in time range)  diclofenac sodium (VOLTAREN) 1 % transdermal gel 2-4 g (has no administration in time range)  DULoxetine (CYMBALTA) DR capsule 60 mg (has no administration in time range)  fluticasone (FLONASE) 50 MCG/ACT nasal spray 2 spray (has no administration in time range)  gabapentin (NEURONTIN) capsule 100 mg (has no administration in time range)  magnesium oxide (MAG-OX) tablet 400 mg (has no administration in time range)  mirabegron ER (MYRBETRIQ) tablet 50 mg (has no administration in time range)  pantoprazole (PROTONIX) EC tablet 40 mg (has no administration in time range)  rOPINIRole (REQUIP) tablet 2 mg (has no administration in time range)  darifenacin (ENABLEX) 24 hr tablet 7.5 mg (has no administration in time range)  HYDROcodone-acetaminophen (NORCO/VICODIN) 5-325 MG per tablet 1-2 tablet (2 tablets Oral Given 04/10/19 2344)  polyethylene glycol (MIRALAX / GLYCOLAX) packet 17 g (has no administration in time range)  ketorolac (TORADOL) 30 MG/ML injection 30 mg (30 mg Intravenous Given 04/10/19 2344)  HYDROmorphone (DILAUDID) injection 0.5 mg (0.5 mg Intravenous Given 04/10/19 2139)  ondansetron (ZOFRAN) injection 4 mg (4 mg Intravenous Given 04/10/19 2139)     Mobility non-ambulatory Moderate fall risk   Focused Assessments Cardiac Assessment Handoff:    Lab Results  Component Value Date   TROPONINI <0.03 05/30/2018   No results found for: DDIMER Does the Patient currently have chest pain? No  , Neuro Assessment Handoff:  Swallow screen pass? Yes          Neuro Assessment: Within Defined Limits Neuro Checks:      Last Documented NIHSS Modified Score:   Has TPA been given? No If patient is a Neuro Trauma and patient is going to OR before floor call report to 4N Charge nurse: [phone] or [phone]     R Recommendations: See Admitting Provider Note  Report given to:   Additional Notes:

## 2019-04-11 NOTE — Progress Notes (Signed)
Advanced care plan.  Purpose of the Encounter: CODE STATUS  Parties in Attendance: Patient himself  Patient's Decision Capacity: Intact  Subjective/Patient's story: Rhonda Griffith  is a 73 y.o. female with a known history of COPD, peripheral artery disease, RLS, history of CVA in 1991, atrial fibrillation, mitral valve stenosis, aortic valve stenosis, CKD stage I.  Patient presented to the emergency room after a fall at home complaining of pain in her right hip reporting severe pain and inability to move her right hip.   Objective/Medical story I discussed with the patient regarding her desire for cardiac and pulmonary resuscitation.   Goals of care determination:  I discussed with the patient regarding her desires from cardiac and pulmonary resuscitation Patient states that since her husband passed away she wants to be with them and does not want any heroic measures    CODE STATUS: DNR   Time spent discussing advanced care planning: 16 minutes

## 2019-04-11 NOTE — ED Notes (Signed)
Olivia to call when low bed is secured for patient.

## 2019-04-12 LAB — PROTIME-INR
INR: 1.4 — ABNORMAL HIGH (ref 0.8–1.2)
Prothrombin Time: 17.4 seconds — ABNORMAL HIGH (ref 11.4–15.2)

## 2019-04-12 MED ORDER — SENNOSIDES-DOCUSATE SODIUM 8.6-50 MG PO TABS
1.0000 | ORAL_TABLET | Freq: Two times a day (BID) | ORAL | Status: DC
  Filled 2019-04-12: qty 1

## 2019-04-12 MED ORDER — HYDROMORPHONE HCL 1 MG/ML IJ SOLN
0.5000 mg | Freq: Once | INTRAMUSCULAR | Status: AC
  Administered 2019-04-12: 22:00:00 0.5 mg via INTRAVENOUS
  Filled 2019-04-12: qty 1

## 2019-04-12 MED ORDER — DIAZEPAM 2 MG PO TABS
2.0000 mg | ORAL_TABLET | Freq: Every evening | ORAL | Status: DC | PRN
  Administered 2019-04-12: 2 mg via ORAL
  Filled 2019-04-12: qty 1

## 2019-04-12 NOTE — TOC Initial Note (Signed)
Transition of Care [facility]) - Initial/Assessment Note    Patient Details  Name: Rhonda Griffith MRN: [id number] Date of Birth: [birth year]  Transition of [facility]) CM/SW Contact:    Lizbeth Feijoo, Lenice Llamas Phone Number: [phone]  04/12/2019, 3:15 PM  Clinical Narrative:  Clinical Social Worker (CSW) met with patient today prior to surgery. Patient was alert and oriented X4 and was laying in the bed. CSW introduced self and explained role of CSW department. Per patient she lives in [place] with her daughter [name] and son in law Crane. CSW explained that after surgery PT will evaluate her and make a recommendation of home health or SNF. Patient is agreeable to SNF if needed. FL2 complete and faxed out. CSW explained that [facility] will have to approve SNF. CSW will continue to follow and assist as needed.                    Expected Discharge Plan: Skilled Nursing Facility Barriers to Discharge: Continued Medical Work up   Patient Goals and CMS Choice Patient states their goals for this hospitalization and ongoing recovery are:: Pain control   Choice offered to / list presented to : Patient  Expected Discharge Plan and Services Expected Discharge Plan: [place] In-house Referral: Clinical Social Work Discharge Planning Services: CM Consult Post Acute Care Choice: [place] Living arrangements for the past 2 months: [place]                                      Prior Living Arrangements/Services Living arrangements for the past 2 months: Single Family Home Lives with:: Adult Children Patient language and need for interpreter reviewed:: No Do you feel safe going back to the place where you live?: Yes      Need for Family Participation in Patient Care: Yes (Comment) Care giver support system in place?: Yes (comment)   Criminal Activity/Legal Involvement Pertinent to Current Situation/Hospitalization: No - Comment as  needed  Activities of Daily Living Home Assistive Devices/Equipment: [place] Rile (specify type) ADL Screening (condition at time of admission) Patient's cognitive ability adequate to safely complete daily activities?: Yes Is the patient deaf or have difficulty hearing?: No Does the patient have difficulty seeing, even when wearing glasses/contacts?: No Does the patient have difficulty concentrating, remembering, or making decisions?: No Patient able to express need for assistance with ADLs?: Yes Does the patient have difficulty dressing or bathing?: Yes Independently performs ADLs?: Yes (appropriate for developmental age) Does the patient have difficulty walking or climbing stairs?: No Weakness of Legs: Both Weakness of Arms/Hands: None  Permission Sought/Granted Permission sought to share information with : Chartered certified accountant granted to share information with : Yes, Verbal Permission Granted              Emotional Assessment Appearance:: Appears stated age   Affect (typically observed): Calm, Pleasant Orientation: : Oriented to Self, Oriented to Place, Oriented to  Time, Oriented to Situation Alcohol / Substance Use: Not Applicable Psych Involvement: No (comment)  Admission diagnosis:  Fall, initial encounter [W19.XXXA] Closed fracture of right hip, initial encounter [facility]) [S72.001A] Patient Active Problem List   Diagnosis Date Noted  . Closed right hip fracture, initial encounter ([place]) 04/10/2019  . Degeneration of lumbar intervertebral disc 02/21/2019  . Lumbar radiculopathy 02/21/2019  . Spinal stenosis of lumbar region 02/21/2019  . Cellulitis 08/30/2018  .  Closed fracture of distal end of left fibula 06/28/2018  . Acute respiratory failure with hypoxia ([place]) 10/14/2016  . 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 Influenza A 10/14/2016  . Status post cardiac pacemaker procedure 10/14/2016  . Stroke ([place]) 10/14/2016  . ERRONEOUS ENCOUNTER--DISREGARD 07/03/2016  .  Carotid bruit 12/22/2015  . Hyperlipidemia 10/23/2015  . Atrial fibrillation, permanent   . Pacemaker -Medtronic   . 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 Chronic kidney disease, unspecified   . Encounter for anticoagulation discussion and counseling 06/25/2015  . Acute cardioembolic stroke ([place]) 15/83/0940  . PAD (peripheral artery disease) ([place]) 06/25/2015  . Pure hypercholesterolemia 04/30/2015  . History of recent stroke 04/28/2015  . Essential hypertension 12/09/2014  . Gait instability 09/08/2014  . Status post total bilateral knee replacement 06/24/2014  . UTI (urinary tract infection) 02/03/2014  . Fatigue 10/01/2013  . Shortness of breath 09/12/2012  . Stress disorder, acute 09/12/2012  . Tachycardia 09/12/2012  . Atrial fibrillation ([place]) 09/12/2012  . Melanoma in situ ([place]) 07/11/2012  . [place] 07/11/2012  . Long term (current) use of anticoagulants 07/02/2012  . History of malignant melanoma of skin 05/30/2012  . [place] arthritis 02/02/2012  . Sprain of MCL (medial collateral ligament) of knee 01/24/2012  . Squamous cell carcinoma 12/21/2011  . Carpal tunnel syndrome of left wrist 11/28/2011  . Sinoatrial node dysfunction (HCC)   . Movement disorder 08/08/2011  . Edema 06/07/2011  . Chronic diastolic heart failure ([place]) 05/27/2011  . Anxiety state 05/14/2011  . Esophageal reflux 05/14/2011  . Mitral valve replaced 05/14/2011  . Myalgia and myositis, unspecified 05/14/2011  . OSA (obstructive sleep apnea) 05/14/2011  . Osteoarthritis 05/14/2011  . Pulmonary hypertension ([place]) 05/14/2011  . Cardiac pacemaker in situ 10/03/2010   PCP:  Cecile Sheerer, MD Pharmacy:   [id number] [place] [place] [address]Phone: [phone]: [phone]    Social Determinants of Health (SDOH) Interventions    Readmission Risk Interventions No flowsheet data found.

## 2019-04-12 NOTE — Progress Notes (Signed)
Patient states Lipitor is ok, Pain is improved.

## 2019-04-12 NOTE — NC FL2 (Addendum)
[place] LEVEL OF CARE SCREENING TOOL     IDENTIFICATION  Patient Name: Rhonda Griffith Birthdate: 1946-06-03 Sex: female Admission Date (Current Location): 04/10/2019  [place] and [place] Number:  Engineering geologist and Address:  [facility], [address], [place], [place] [address]      Provider Number: [id number]  Attending Physician Name and Address:  Dustin Flock, MD  Relative Name and Phone Number:       Current Level of Care: Hospital Recommended Level of Care: [place] Prior Approval Number:    Date Approved/Denied:   PASRR Number: [id number]  Discharge Plan: SNF    Current Diagnoses: Patient Active Problem List   Diagnosis Date Noted  . Closed right hip fracture, initial encounter ([place]) 04/10/2019  . Degeneration of lumbar intervertebral disc 02/21/2019  . Lumbar radiculopathy 02/21/2019  . Spinal stenosis of lumbar region 02/21/2019  . Cellulitis 08/30/2018  . Closed fracture of distal end of left fibula 06/28/2018  . Acute respiratory failure with hypoxia ([place]) 10/14/2016  . 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 Influenza A 10/14/2016  . Status post cardiac pacemaker procedure 10/14/2016  . Stroke ([place]) 10/14/2016  . ERRONEOUS ENCOUNTER--DISREGARD 07/03/2016  . Carotid bruit 12/22/2015  . Hyperlipidemia 10/23/2015  . Atrial fibrillation, permanent   . Pacemaker -[employer]tronic   . 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 Chronic kidney disease, unspecified   . Encounter for anticoagulation discussion and counseling 06/25/2015  . Acute cardioembolic stroke ([place]) 56/21/3086  . PAD (peripheral artery disease) ([place]) 06/25/2015  . Pure hypercholesterolemia 04/30/2015  . History of recent stroke 04/28/2015  . Essential hypertension 12/09/2014  . Gait instability 09/08/2014  . Status post total bilateral knee replacement 06/24/2014  . UTI (urinary tract infection) 02/03/2014  . Fatigue  10/01/2013  . Shortness of breath 09/12/2012  . Stress disorder, acute 09/12/2012  . Tachycardia 09/12/2012  . Atrial fibrillation ([place]) 09/12/2012  . Melanoma in situ ([place]) 07/11/2012  . [place] 07/11/2012  . Long term (current) use of anticoagulants 07/02/2012  . History of malignant melanoma of skin 05/30/2012  . [place] arthritis 02/02/2012  . Sprain of MCL (medial collateral ligament) of knee 01/24/2012  . Squamous cell carcinoma 12/21/2011  . Carpal tunnel syndrome of left wrist 11/28/2011  . Sinoatrial node dysfunction (HCC)   . Movement disorder 08/08/2011  . Edema 06/07/2011  . Chronic diastolic heart failure ([place]) 05/27/2011  . Anxiety state 05/14/2011  . Esophageal reflux 05/14/2011  . Mitral valve replaced 05/14/2011  . Myalgia and myositis, unspecified 05/14/2011  . OSA (obstructive sleep apnea) 05/14/2011  . Osteoarthritis 05/14/2011  . Pulmonary hypertension ([place]) 05/14/2011  . Cardiac pacemaker in situ 10/03/2010    Orientation RESPIRATION BLADDER Height & Weight     Self, Time, Situation, Place  O2(2 Liters Oxygen.) Continent Weight: 170 lb (77.1 kg) Height:  5\' 9"  (175.3 cm)  BEHAVIORAL SYMPTOMS/MOOD NEUROLOGICAL BOWEL NUTRITION STATUS      Continent Diet(Diet: Heart Healthy)  AMBULATORY STATUS COMMUNICATION OF NEEDS Skin   Extensive Assist Verbally Surgical wounds                       Personal Care Assistance Level of Assistance  Bathing, Feeding, Dressing Bathing Assistance: Limited assistance Feeding assistance: Independent Dressing Assistance: Limited assistance     Functional Limitations Info  Sight, Hearing, Speech Sight Info: Adequate Hearing Info: Adequate Speech Info: Adequate    SPECIAL CARE FACTORS FREQUENCY  PT (  By licensed PT), OT (By licensed OT)     PT Frequency: 5 OT Frequency: 5            Contractures      Additional Factors Info  Code Status, Allergies Code Status Info: DNR Allergies Info: Morphine, Other,  Statins, Tizanidine, Tape,Tapentadol, Cephalexin, Latex, Sulfa Antibiotics           Current Medications (04/12/2019):  This is the current hospital active medication list Current Facility-Administered Medications  Medication Dose Route Frequency Provider Last Rate Last Dose  . 0.9 %  sodium chloride infusion   Intravenous Continuous Auburn BilberryPatel, Shreyang, MD 75 mL/hr at 04/11/19 1758    . atorvastatin (LIPITOR) tablet 80 mg  80 mg Oral QPM Seals, Angela H, NP   80 mg at 04/11/19 1755  . cholecalciferol (VITAMIN D3) tablet 2,000 Units  2,000 Units Oral Q30 days Seals, Angela H, NP      . darifenacin (ENABLEX) 24 hr tablet 7.5 mg  7.5 mg Oral Daily Seals, Angela H, NP   7.5 mg at 04/12/19 1003  . diclofenac sodium (VOLTAREN) 1 % transdermal gel 2-4 g  2-4 g Topical BID PRN Janeann MerlSeals, Angela H, NP   2 g at 04/12/19 0327  . diltiazem (CARDIZEM CD) 24 hr capsule 120 mg  120 mg Oral Daily Seals, Milas KocherAngela H, NP      . DULoxetine (CYMBALTA) DR capsule 60 mg  60 mg Oral QHS Seals, Milas Kocherngela H, NP   60 mg at 04/11/19 2207  . feeding supplement (ENSURE ENLIVE) (ENSURE ENLIVE) liquid 237 mL  237 mL Oral BID BM Auburn BilberryPatel, Shreyang, MD   237 mL at 04/12/19 1004  . fluticasone (FLONASE) 50 MCG/ACT nasal spray 2 spray  2 spray Each Nare Daily Seals, Milas KocherAngela H, NP   2 spray at 04/12/19 1004  . gabapentin (NEURONTIN) capsule 100 mg  100 mg Oral BID Janeann MerlSeals, Angela H, NP   100 mg at 04/12/19 1003  . HYDROcodone-acetaminophen (NORCO/VICODIN) 5-325 MG per tablet 1-2 tablet  1-2 tablet Oral Q6H PRN Pearletha AlfredSeals, Angela H, NP   2 tablet at 04/12/19 1235  . magnesium oxide (MAG-OX) tablet 400 mg  400 mg Oral BID Janeann MerlSeals, Angela H, NP   400 mg at 04/12/19 1003  . pantoprazole (PROTONIX) EC tablet 40 mg  40 mg Oral Daily Seals, Angela H, NP   40 mg at 04/12/19 1003  . polyethylene glycol (MIRALAX / GLYCOLAX) packet 17 g  17 g Oral Daily PRN Seals, Marylene LandAngela H, NP      . rOPINIRole (REQUIP) tablet 2 mg  2 mg Oral QHS Seals, Milas KocherAngela H, NP   2 mg at  04/11/19 2207  . senna-docusate (Senokot-S) tablet 1 tablet  1 tablet Oral BID Auburn BilberryPatel, Shreyang, MD      . vitamin C (ASCORBIC ACID) tablet 500 mg  500 mg Oral Daily Seals, Angela H, NP   500 mg at 04/12/19 1003     Discharge Medications: Please see discharge summary for a list of discharge medications.  Relevant Imaging Results:  Relevant Lab Results:   Additional Information SSN: [id number]  Andreka Stucki, Darleen CrockerBailey M, LCSW

## 2019-04-12 NOTE — Progress Notes (Addendum)
[facility] - [place] at [facility]                                                                                                                                                                                  Patient Demographics   Rhonda Griffith, is a 73 y.o. female, DOB - 11/25/1945, RUE:[id number]  Admit date - 04/10/2019   Admitting Physician Hannah BeatJan A Mansy, MD  Outpatient Primary MD for the patient is Shapely-Quinn, Desiree Lucyodd [place], MD   LOS - 2  Subjective: Patient's INR is dropping 1.4 today states that her right hip pain is under control   Review of Systems:   CONSTITUTIONAL: No documented fever. No fatigue, weakness. No weight gain, no weight loss.  EYES: No blurry or double vision.  ENT: No tinnitus. No postnasal drip. No redness of the oropharynx.  RESPIRATORY: No cough, no wheeze, no hemoptysis. No dyspnea.  CARDIOVASCULAR: No chest pain. No orthopnea. No palpitations. No syncope.  GASTROINTESTINAL: No nausea, no vomiting or diarrhea. No abdominal pain. No melena or hematochezia.  GENITOURINARY: No dysuria or hematuria.  ENDOCRINE: No polyuria or nocturia. No heat or cold intolerance.  HEMATOLOGY: No anemia. No bruising. No bleeding.  INTEGUMENTARY: No rashes. No lesions.  MUSCULOSKELETAL: Right hip pain NEUROLOGIC: No numbness, tingling, or ataxia. No seizure-type activity.  PSYCHIATRIC: No anxiety. No insomnia. No ADD.    Vitals:   Vitals:   04/11/19 0732 04/11/19 1700 04/11/19 2247 04/12/19 0829  BP: (!) 120/47 (!) 132/56 (!) 128/49 (!) 143/59  Pulse: 63 61 60 62  Resp: 16 18 17 18   Temp: (!) 97.5 F (36.4 C) (!) 97.3 F (36.3 C) 98 F (36.7 C) 98.5 F (36.9 C)  TempSrc: Oral Oral  Oral  SpO2: 98% 99% 97% 97%  Weight:      Height:        Wt Readings from Last 3 Encounters:  04/10/19 77.1 kg  02/26/19 72.6 kg  09/21/18 74.8 kg     Intake/Output Summary (Last 24 hours) at 04/12/2019 1156 Last data filed at 04/12/2019 0900 Gross per  24 hour  Intake 956.68 ml  Output 700 ml  Net 256.68 ml    Physical Exam:   GENERAL: Pleasant-appearing in no apparent distress.  HEAD, EYES, EARS, NOSE AND THROAT: Atraumatic, normocephalic. Extraocular muscles are intact. Pupils equal and reactive to light. Sclerae anicteric. No conjunctival injection. No oro-pharyngeal erythema.  NECK: Supple. There is no jugular venous distention. No bruits, no lymphadenopathy, no thyromegaly.  HEART: Regular rate and rhythm,. No murmurs, no rubs, no clicks.  LUNGS: Clear to auscultation bilaterally. No rales or rhonchi. No wheezes.  ABDOMEN: Soft, flat, nontender, nondistended. Has  good bowel sounds. No hepatosplenomegaly appreciated.  EXTREMITIES: No evidence of any cyanosis, clubbing, or peripheral edema.  +2 pedal and radial pulses bilaterally.  NEUROLOGIC: The patient is alert, awake, and oriented x3 with no focal motor or sensory deficits appreciated bilaterally.  SKIN: Moist and warm with no rashes appreciated.  Psych: Not anxious, depressed LN: No inguinal LN enlargement    Antibiotics   Anti-infectives (From admission, onward)   Start     Dose/Rate Route Frequency Ordered Stop   04/10/19 2330  clindamycin (CLEOCIN) IVPB 600 mg    Note to Pharmacy: SEND WITH THE PATIENT TO THE OR. DO NOT ADMINISTER ON THE UNIT!   600 mg 100 mL/hr over 30 Minutes Intravenous To Surgery 04/10/19 2243 04/11/19 2330      Medications   Scheduled Meds: . atorvastatin  80 mg Oral QPM  . cholecalciferol  2,000 Units Oral Q30 days  . darifenacin  7.5 mg Oral Daily  . diltiazem  120 mg Oral Daily  . DULoxetine  60 mg Oral QHS  . feeding supplement (ENSURE ENLIVE)  237 mL Oral BID BM  . fluticasone  2 spray Each Nare Daily  . gabapentin  100 mg Oral BID  . magnesium oxide  400 mg Oral BID  . pantoprazole  40 mg Oral Daily  . rOPINIRole  2 mg Oral QHS  . ascorbic acid  500 mg Oral Daily   Continuous Infusions: . sodium chloride 75 mL/hr at 04/11/19  1758   PRN Meds:.diclofenac sodium, HYDROcodone-acetaminophen, polyethylene glycol   Data Review:   Micro Results Recent Results (from the past 240 hour(s))  SARS Coronavirus 2 (CEPHEID- Performed in [facility] lab), Hosp Order     Status: None   Collection Time: 04/10/19 11:19 PM   Specimen: Nasopharyngeal Swab  Result Value Ref Range Status   SARS Coronavirus 2 NEGATIVE NEGATIVE Final    Comment: (NOTE) If result is NEGATIVE SARS-CoV-2 target nucleic acids are NOT DETECTED. The SARS-CoV-2 RNA is generally detectable in upper and lower  respiratory specimens during the acute phase of infection. The lowest  concentration of SARS-CoV-2 viral copies this assay can detect is 250  copies / mL. A negative result does not preclude SARS-CoV-2 infection  and should not be used as the sole basis for treatment or other  patient management decisions.  A negative result may occur with  improper specimen collection / handling, submission of specimen other  than nasopharyngeal swab, presence of viral mutation(s) within the  areas targeted by this assay, and inadequate number of viral copies  (<250 copies / mL). A negative result must be combined with clinical  observations, patient history, and epidemiological information. If result is POSITIVE SARS-CoV-2 target nucleic acids are DETECTED. The SARS-CoV-2 RNA is generally detectable in upper and lower  respiratory specimens dur ing the acute phase of infection.  Positive  results are indicative of active infection with SARS-CoV-2.  Clinical  correlation with patient history and other diagnostic information is  necessary to determine patient infection status.  Positive results do  not rule out bacterial infection or co-infection with other viruses. If result is PRESUMPTIVE POSTIVE SARS-CoV-2 nucleic acids MAY BE PRESENT.   A presumptive positive result was obtained on the submitted specimen  and confirmed on repeat testing.  While  2019 novel coronavirus  (SARS-CoV-2) nucleic acids may be present in the submitted sample  additional confirmatory testing may be necessary for epidemiological  and / or clinical management purposes  to  differentiate between  SARS-CoV-2 and other Sarbecovirus currently known to infect humans.  If clinically indicated additional testing with an alternate test  methodology [id number]) is advised. The SARS-CoV-2 RNA is generally  detectable in upper and lower respiratory sp ecimens during the acute  phase of infection. The expected result is Negative. Fact Sheet for Patients:  BoilerBrush.com.cy Fact Sheet for Healthcare Providers: https://pope.com/ This test is not yet approved or cleared by the [place] FDA and has been authorized for detection and/or diagnosis of SARS-CoV-2 by FDA under an Emergency Use Authorization (EUA).  This EUA will remain in effect (meaning this test can be used) for the duration of the COVID-19 declaration under Section 564(b)(1) of the Act, 21 U.S.C. section 360bbb-3(b)(1), unless the authorization is terminated or revoked sooner. Performed at [facility], [address] [place], [place] [address]   MRSA PCR Screening     Status: None   Collection Time: 04/11/19  2:35 AM   Specimen: Nasal Mucosa; Nasopharyngeal  Result Value Ref Range Status   MRSA by PCR NEGATIVE NEGATIVE Final    Comment:        The GeneXpert MRSA Assay (FDA approved for NASAL specimens only), is one component of a comprehensive MRSA colonization surveillance program. It is not intended to diagnose MRSA infection nor to guide or monitor treatment for MRSA infections. Performed at [facility], [address], [place], [place] [address]     Radiology Reports Dg Chest 1 View  Result Date: 04/09/2019 CLINICAL DATA:  Right hip pain after fall. EXAM: CHEST  1 VIEW COMPARISON:  Radiographs of March 24, 2018.  FINDINGS: Stable cardiomegaly. Status post cardiac valve repair. Left-sided pacemaker is unchanged in position. Atherosclerosis of thoracic aorta is noted. Both lungs are clear. The visualized skeletal structures are unremarkable. IMPRESSION: No active disease. Aortic Atherosclerosis (ICD10-I70.0). Electronically Signed   By: Lupita Raider M.D.   On: 04/09/2019 16:14   Ct Head Wo Contrast  Result Date: 04/10/2019 CLINICAL DATA:  73 y/o  F; fall with right hip pain. EXAM: CT HEAD WITHOUT CONTRAST TECHNIQUE: Contiguous axial images were obtained from the base of the skull through the vertex without intravenous contrast. COMPARISON:  08/03/2018 CT head. FINDINGS: Brain: No evidence of acute infarction, hemorrhage, extra-axial collection or mass lesion/mass effect. Stable chronic infarcts in right basal ganglia, left thalamus, and right cerebellum. Stable chronic hydrocephalus. Stable chronic microvascular ischemic changes of white matter and volume loss of the brain. Vascular: Calcific atherosclerosis of internal carotid arteries. No hyperdense vessel. Skull: Normal. Negative for fracture or focal lesion. Sinuses/Orbits: No acute finding. Other: None. IMPRESSION: 1. No acute intracranial abnormality identified. 2. Stable chronic microvascular ischemic changes and volume loss of the brain. 3. Stable chronic infarcts in right basal ganglia, left thalamus, and right cerebellum. 4. Stable chronic hydrocephalus. Electronically Signed   By: Mitzi Hansen M.D.   On: 04/10/2019 22:13   Ct Hip Right Wo Contrast  Result Date: 04/09/2019 CLINICAL DATA:  Right hip pain status post fall EXAM: CT OF THE RIGHT HIP WITHOUT CONTRAST TECHNIQUE: Multidetector CT imaging of the right hip was performed according to the standard protocol. Multiplanar CT image reconstructions were also generated. COMPARISON:  None. FINDINGS: Bones/Joint/Cartilage No fracture or dislocation. Normal alignment. No joint effusion. Mild  osteoarthritis of the right hip. Heterogeneous marrow of the proximal femoral diaphysis which may reflect red marrow or hemorrhage. Ligaments Ligaments are suboptimally evaluated by CT. Muscles and Tendons Muscles are normal.  No intramuscular  fluid collection. Soft tissue No fluid collection or hematoma.  No soft tissue mass. IMPRESSION: No acute osseous injury of the right hip. Heterogeneous marrow of the proximal femoral diaphysis which may reflect red marrow or hemorrhage. Recommend further evaluation with MR of the femur. Electronically Signed   By: Elige KoHetal  Lashay Osborne   On: 04/09/2019 17:10   Dg Chest Portable 1 View  Result Date: 04/10/2019 CLINICAL DATA:  Status post fall with right hip pain. EXAM: PORTABLE CHEST 1 VIEW COMPARISON:  April 09, 2019 FINDINGS: The heart size and mediastinal contours are stable. Cardiac valvular replacement ring and cardiac pacemaker are unchanged. The heart size is enlarged. The lungs are hyperinflated. There is no focal infiltrate, pulmonary edema, or pleural effusion. The visualized skeletal structures are unremarkable. IMPRESSION: No active cardiopulmonary disease. Electronically Signed   By: Sherian ReinWei-Chen  Lin M.D.   On: 04/10/2019 21:48   Dg Hip Unilat W Or Wo Pelvis 2-3 Views Right  Result Date: 04/10/2019 CLINICAL DATA:  Status post fall with right hip pain. EXAM: DG HIP (WITH OR WITHOUT PELVIS) 2-3V RIGHT COMPARISON:  April 09, 2019 FINDINGS: There is deformity of the proximal right femur in the femoral neck region suspicious for fracture. No other acute fracture dislocation is identified. IMPRESSION: There is deformity of the proximal right femur in the femoral neck region suspicious for fracture. Electronically Signed   By: Sherian ReinWei-Chen  Lin M.D.   On: 04/10/2019 21:47   Dg Hip Unilat W Or Wo Pelvis 2-3 Views Right  Result Date: 04/09/2019 CLINICAL DATA:  Right hip pain after fall. EXAM: DG HIP (WITH OR WITHOUT PELVIS) 2-3V RIGHT COMPARISON:  None. FINDINGS: There is no  evidence of hip fracture or dislocation. There is no evidence of arthropathy or other focal bone abnormality. IMPRESSION: Negative. Electronically Signed   By: Lupita RaiderJames  Green Jr M.D.   On: 04/09/2019 16:12     CBC Recent Labs  Lab 04/10/19 2051 04/11/19 0253  WBC 9.2 10.2  HGB 12.0 10.7*  HCT 38.6 34.2*  PLT 277 216  MCV 98.0 97.2  MCH 30.5 30.4  MCHC 31.1 31.3  RDW 12.6 12.7  LYMPHSABS 1.3  --   MONOABS 0.8  --   EOSABS 0.2  --   BASOSABS 0.1  --     Chemistries  Recent Labs  Lab 04/10/19 2051 04/11/19 0253  NA 137 137  K 4.1 4.8  CL 104 105  CO2 26 26  GLUCOSE 98 108*  BUN 20 22  CREATININE 1.07* 1.38*  CALCIUM 10.0 9.4  AST 19  --   ALT 12  --   ALKPHOS 99  --   BILITOT 1.0  --    ------------------------------------------------------------------------------------------------------------------ estimated creatinine clearance is 36.8 mL/min (A) (by C-G formula based on SCr of 1.38 mg/dL (H)). ------------------------------------------------------------------------------------------------------------------ No results for input(s): HGBA1C in the last 72 hours. ------------------------------------------------------------------------------------------------------------------ No results for input(s): CHOL, HDL, LDLCALC, TRIG, CHOLHDL, LDLDIRECT in the last 72 hours. ------------------------------------------------------------------------------------------------------------------ No results for input(s): TSH, T4TOTAL, T3FREE, THYROIDAB in the last 72 hours.  Invalid input(s): FREET3 ------------------------------------------------------------------------------------------------------------------ No results for input(s): VITAMINB12, FOLATE, FERRITIN, TIBC, IRON, RETICCTPCT in the last 72 hours.  Coagulation profile Recent Labs  Lab 04/10/19 2051 04/11/19 0253 04/12/19 0425  INR 2.4* 2.8* 1.4*    No results for input(s): DDIMER in the last 72 hours.  Cardiac  Enzymes No results for input(s): CKMB, TROPONINI, MYOGLOBIN in the last 168 hours.  Invalid input(s): CK ------------------------------------------------------------------------------------------------------------------ Invalid input(s): POCBNP    Assessment &  Plan   1.  right hip fracture -  Seen by orthopedics plan for surgery tomorrow -INR is 1.4 today -Patient has no cardio pulmonary symptoms okay to proceed to surgery once INR is lower  2.  Atrial fibrillation - Also with a history of mitral valve and aortic valve stenosis as well as pacemaker placement -  Hold Coumadin for now, resume postop   3.  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  CKD - Will monitor renal function closely. -Renal function creatinine has increased will continue IV fluids  5.  Peripheral artery disease     Code Status Orders  (From admission, onward)         Start     Ordered   04/10/19 2246  Full code  Continuous     04/10/19 2245        Code Status History    Date Active Date Inactive Code Status Order ID Comments User Context   09/21/2018 1355 09/21/2018 1827 Full Code [id number]  Deboraha Sprang, MD Inpatient   Advance Care Planning Activity     Updated daughter on the plan of care     Consults orthopedics  DVT Prophylaxis SCDs  Lab Results  Component Value Date   PLT 216 04/11/2019     Time Spent in minutes 35 minutes Dustin Flock M.D on 04/12/2019 at 11:56 AM  Between 7am to 6pm - Pager - [phone]  After 6pm go to [email or web address] - Proofreader  [facility]   Office  [phone]

## 2019-04-13 ENCOUNTER — Encounter
Admission: EM | Disposition: A | Discharge status: Skilled Nursing Facility | Payer: Self-pay | Source: Home / Self Care | Attending: Internal Medicine

## 2019-04-13 ENCOUNTER — Inpatient Hospital Stay: Payer: Medicare Other

## 2019-04-13 ENCOUNTER — Inpatient Hospital Stay: Payer: Medicare Other | Admitting: Anesthesiology

## 2019-04-13 HISTORY — PX: HIP ARTHROPLASTY: SHX981

## 2019-04-13 LAB — BASIC METABOLIC PANEL
Anion gap: 7 (ref 5–15)
BUN: 32 mg/dL — ABNORMAL HIGH (ref 8–23)
CO2: 29 mmol/L (ref 22–32)
Calcium: 9.1 mg/dL (ref 8.9–10.3)
Chloride: 103 mmol/L (ref 98–111)
Creatinine, Ser: 1.2 mg/dL — ABNORMAL HIGH (ref 0.44–1.00)
GFR calc Af Amer: 51 mL/min — ABNORMAL LOW (ref >60)
GFR calc non Af Amer: 44 mL/min — ABNORMAL LOW (ref >60)
Glucose, Bld: 123 mg/dL — ABNORMAL HIGH (ref 70–99)
Potassium: 4.9 mmol/L (ref 3.5–5.1)
Sodium: 139 mmol/L (ref 135–145)

## 2019-04-13 LAB — BPAM RBC
Blood Product Expiration Date: 202007212359
Blood Product Expiration Date: 202007212359
Unit Type and Rh: 5100
Unit Type and Rh: 5100

## 2019-04-13 LAB — CBC
HCT: 33 % — ABNORMAL LOW (ref 36.0–46.0)
Hemoglobin: 10.2 g/dL — ABNORMAL LOW (ref 12.0–15.0)
MCH: 30.7 pg (ref 26.0–34.0)
MCHC: 30.9 g/dL (ref 30.0–36.0)
MCV: 99.4 fL (ref 80.0–100.0)
Platelets: 200 10*3/uL (ref 150–400)
RBC: 3.32 MIL/uL — ABNORMAL LOW (ref 3.87–5.11)
RDW: 12.2 % (ref 11.5–15.5)
WBC: 8.5 10*3/uL (ref 4.0–10.5)
nRBC: 0 % (ref 0.0–0.2)

## 2019-04-13 LAB — TYPE AND SCREEN
ABO/RH(D): O POS
Antibody Screen: POSITIVE
PT AG Type: POSITIVE
Unit division: 0
Unit division: 0

## 2019-04-13 LAB — PROTIME-INR
INR: 1.2 (ref 0.8–1.2)
Prothrombin Time: 15 seconds (ref 11.4–15.2)

## 2019-04-13 SURGERY — HEMIARTHROPLASTY, HIP, DIRECT ANTERIOR APPROACH, FOR FRACTURE
Anesthesia: Monitor Anesthesia Care | Laterality: Right

## 2019-04-13 MED ORDER — FENTANYL CITRATE (PF) 100 MCG/2ML IJ SOLN
25.0000 ug | INTRAMUSCULAR | Status: DC | PRN
  Administered 2019-04-13: 50 ug via INTRAVENOUS

## 2019-04-13 MED ORDER — FENTANYL CITRATE (PF) 100 MCG/2ML IJ SOLN
INTRAMUSCULAR | Status: AC
  Filled 2019-04-13: qty 2

## 2019-04-13 MED ORDER — HYDROMORPHONE BOLUS VIA INFUSION: 0.5000 mg | INTRAVENOUS | Status: DC | PRN

## 2019-04-13 MED ORDER — ONDANSETRON HCL 4 MG/2ML IJ SOLN: 4.0000 mg | Freq: Four times a day (QID) | INTRAMUSCULAR | Status: DC | PRN

## 2019-04-13 MED ORDER — NEOMYCIN-POLYMYXIN B GU 40-200000 IR SOLN
Status: DC | PRN
  Administered 2019-04-13: 16 mL

## 2019-04-13 MED ORDER — CLINDAMYCIN PHOSPHATE 600 MG/50ML IV SOLN
600.0000 mg | Freq: Four times a day (QID) | INTRAVENOUS | Status: AC
  Administered 2019-04-13 (×2): 600 mg via INTRAVENOUS
  Filled 2019-04-13 (×2): qty 50

## 2019-04-13 MED ORDER — ACETAMINOPHEN 10 MG/ML IV SOLN
INTRAVENOUS | Status: AC
  Filled 2019-04-13: qty 100

## 2019-04-13 MED ORDER — MAGNESIUM HYDROXIDE 400 MG/5ML PO SUSP
30.0000 mL | Freq: Every day | ORAL | Status: DC | PRN
  Administered 2019-04-13 – 2019-04-14 (×2): 30 mL via ORAL
  Filled 2019-04-13 (×2): qty 30

## 2019-04-13 MED ORDER — ACETAMINOPHEN 325 MG PO TABS: 325.0000 mg | ORAL_TABLET | ORAL | Status: DC | PRN

## 2019-04-13 MED ORDER — PROPOFOL 500 MG/50ML IV EMUL
INTRAVENOUS | Status: AC
  Filled 2019-04-13: qty 50

## 2019-04-13 MED ORDER — PROPOFOL 500 MG/50ML IV EMUL
INTRAVENOUS | Status: DC | PRN
  Administered 2019-04-13: 50 ug/kg/min via INTRAVENOUS

## 2019-04-13 MED ORDER — SODIUM CHLORIDE 0.9 % IV SOLN
INTRAVENOUS | Status: DC
  Administered 2019-04-13 – 2019-04-14 (×2): via INTRAVENOUS

## 2019-04-13 MED ORDER — DEXAMETHASONE SODIUM PHOSPHATE 10 MG/ML IJ SOLN
INTRAMUSCULAR | Status: DC | PRN
  Administered 2019-04-13: 5 mg via INTRAVENOUS

## 2019-04-13 MED ORDER — CLINDAMYCIN PHOSPHATE 600 MG/50ML IV SOLN
INTRAVENOUS | Status: AC
  Filled 2019-04-13: qty 50

## 2019-04-13 MED ORDER — BUPIVACAINE HCL (PF) 0.5 % IJ SOLN
INTRAMUSCULAR | Status: DC | PRN
  Administered 2019-04-13: 2.5 mL

## 2019-04-13 MED ORDER — PHENYLEPHRINE HCL (PRESSORS) 10 MG/ML IV SOLN
INTRAVENOUS | Status: AC
  Filled 2019-04-13: qty 1

## 2019-04-13 MED ORDER — EPINEPHRINE PF 1 MG/ML IJ SOLN
INTRAMUSCULAR | Status: AC
  Filled 2019-04-13: qty 1

## 2019-04-13 MED ORDER — ACETAMINOPHEN 325 MG PO TABS: 325.0000 mg | ORAL_TABLET | Freq: Four times a day (QID) | ORAL | Status: DC | PRN

## 2019-04-13 MED ORDER — NEOMYCIN-POLYMYXIN B GU 40-200000 IR SOLN
Status: AC
  Filled 2019-04-13: qty 20

## 2019-04-13 MED ORDER — WARFARIN - PHARMACIST DOSING INPATIENT
Freq: Every day | Status: DC
  Administered 2019-04-14: 17:00:00

## 2019-04-13 MED ORDER — OXYCODONE HCL 5 MG PO TABS
5.0000 mg | ORAL_TABLET | ORAL | Status: DC | PRN
  Administered 2019-04-13 – 2019-04-14 (×2): 5 mg via ORAL
  Filled 2019-04-13 (×2): qty 1

## 2019-04-13 MED ORDER — WARFARIN SODIUM 5 MG PO TABS
5.0000 mg | ORAL_TABLET | Freq: Once | ORAL | Status: AC
  Administered 2019-04-13: 18:00:00 5 mg via ORAL
  Filled 2019-04-13: qty 1

## 2019-04-13 MED ORDER — METOCLOPRAMIDE HCL 5 MG/ML IJ SOLN: 5.0000 mg | Freq: Three times a day (TID) | INTRAMUSCULAR | Status: DC | PRN

## 2019-04-13 MED ORDER — ONDANSETRON HCL 4 MG/2ML IJ SOLN
INTRAMUSCULAR | Status: DC | PRN
  Administered 2019-04-13: 4 mg via INTRAVENOUS

## 2019-04-13 MED ORDER — KETAMINE HCL 50 MG/ML IJ SOLN
INTRAMUSCULAR | Status: DC | PRN
  Administered 2019-04-13 (×4): 10 mg via INTRAMUSCULAR

## 2019-04-13 MED ORDER — HYDROMORPHONE HCL 1 MG/ML IJ SOLN: 0.5000 mg | INTRAMUSCULAR | Status: DC | PRN

## 2019-04-13 MED ORDER — MENTHOL 3 MG MT LOZG
1.0000 | LOZENGE | OROMUCOSAL | Status: DC | PRN
  Filled 2019-04-13: qty 9

## 2019-04-13 MED ORDER — ONDANSETRON HCL 4 MG PO TABS: 4.0000 mg | ORAL_TABLET | Freq: Four times a day (QID) | ORAL | Status: DC | PRN

## 2019-04-13 MED ORDER — METOCLOPRAMIDE HCL 10 MG PO TABS: 5.0000 mg | ORAL_TABLET | Freq: Three times a day (TID) | ORAL | Status: DC | PRN

## 2019-04-13 MED ORDER — KETAMINE HCL 50 MG/ML IJ SOLN
INTRAMUSCULAR | Status: AC
  Filled 2019-04-13: qty 10

## 2019-04-13 MED ORDER — METOCLOPRAMIDE HCL 10 MG PO TABS
10.0000 mg | ORAL_TABLET | Freq: Three times a day (TID) | ORAL | Status: AC
  Administered 2019-04-13 – 2019-04-15 (×8): 10 mg via ORAL
  Filled 2019-04-13 (×8): qty 1

## 2019-04-13 MED ORDER — FLEET ENEMA 7-19 GM/118ML RE ENEM: 1.0000 | ENEMA | Freq: Once | RECTAL | Status: DC | PRN

## 2019-04-13 MED ORDER — OXYCODONE HCL 5 MG PO TABS
10.0000 mg | ORAL_TABLET | ORAL | Status: DC | PRN
  Administered 2019-04-13: 10 mg via ORAL
  Filled 2019-04-13: qty 2

## 2019-04-13 MED ORDER — ENOXAPARIN SODIUM 40 MG/0.4ML ~~LOC~~ SOLN
40.0000 mg | SUBCUTANEOUS | Status: DC
  Administered 2019-04-13 – 2019-04-14 (×2): 40 mg via SUBCUTANEOUS
  Filled 2019-04-13 (×2): qty 0.4

## 2019-04-13 MED ORDER — BUPIVACAINE HCL (PF) 0.5 % IJ SOLN
INTRAMUSCULAR | Status: AC
  Filled 2019-04-13: qty 10

## 2019-04-13 MED ORDER — TRAMADOL HCL 50 MG PO TABS
50.0000 mg | ORAL_TABLET | ORAL | Status: DC | PRN
  Administered 2019-04-13: 50 mg via ORAL
  Administered 2019-04-14 (×2): 100 mg via ORAL
  Filled 2019-04-13: qty 1
  Filled 2019-04-13 (×2): qty 2

## 2019-04-13 MED ORDER — MEPERIDINE HCL 50 MG/ML IJ SOLN: 6.2500 mg | INTRAMUSCULAR | Status: DC | PRN

## 2019-04-13 MED ORDER — CLINDAMYCIN PHOSPHATE 600 MG/50ML IV SOLN
INTRAVENOUS | Status: DC | PRN
  Administered 2019-04-13: 600 mg via INTRAVENOUS

## 2019-04-13 MED ORDER — HYDROMORPHONE HCL 1 MG/ML IJ SOLN
0.5000 mg | INTRAMUSCULAR | Status: DC | PRN
  Administered 2019-04-13: 0.5 mg via INTRAVENOUS
  Filled 2019-04-13: qty 1

## 2019-04-13 MED ORDER — ACETAMINOPHEN 10 MG/ML IV SOLN
1000.0000 mg | Freq: Four times a day (QID) | INTRAVENOUS | Status: AC
  Administered 2019-04-13 – 2019-04-14 (×3): 1000 mg via INTRAVENOUS
  Filled 2019-04-13 (×3): qty 100

## 2019-04-13 MED ORDER — SODIUM CHLORIDE 0.9 % IV SOLN
INTRAVENOUS | Status: DC | PRN
  Administered 2019-04-13: 20 ug/min via INTRAVENOUS

## 2019-04-13 MED ORDER — SENNOSIDES-DOCUSATE SODIUM 8.6-50 MG PO TABS
1.0000 | ORAL_TABLET | Freq: Two times a day (BID) | ORAL | Status: DC
  Administered 2019-04-13 – 2019-04-15 (×4): 1 via ORAL
  Filled 2019-04-13 (×4): qty 1

## 2019-04-13 MED ORDER — FERROUS SULFATE 325 (65 FE) MG PO TABS
325.0000 mg | ORAL_TABLET | Freq: Two times a day (BID) | ORAL | Status: DC
  Administered 2019-04-13 – 2019-04-15 (×4): 325 mg via ORAL
  Filled 2019-04-13 (×4): qty 1

## 2019-04-13 MED ORDER — BISACODYL 10 MG RE SUPP: 10.0000 mg | Freq: Every day | RECTAL | Status: DC | PRN

## 2019-04-13 MED ORDER — ACETAMINOPHEN 160 MG/5ML PO SOLN: 325.0000 mg | ORAL | Status: DC | PRN

## 2019-04-13 MED ORDER — ONDANSETRON HCL 4 MG/2ML IJ SOLN: 4.0000 mg | Freq: Once | INTRAMUSCULAR | Status: DC | PRN

## 2019-04-13 MED ORDER — PHENOL 1.4 % MT LIQD
1.0000 | OROMUCOSAL | Status: DC | PRN
  Filled 2019-04-13: qty 177

## 2019-04-13 MED ORDER — FENTANYL CITRATE (PF) 100 MCG/2ML IJ SOLN
INTRAMUSCULAR | Status: DC | PRN
  Administered 2019-04-13: 50 ug via INTRAVENOUS

## 2019-04-13 MED ORDER — STERILE WATER FOR INJECTION IJ SOLN
INTRAMUSCULAR | Status: AC
  Filled 2019-04-13: qty 10

## 2019-04-13 SURGICAL SUPPLY — 60 items
BAG DECANTER FOR FLEXI CONT (MISCELLANEOUS) ×3 IMPLANT
BLADE SAW 90X25X1.19 OSCILLAT (BLADE) ×3 IMPLANT
CANISTER SUCT 1200ML W/VALVE (MISCELLANEOUS) IMPLANT
CANISTER SUCT 3000ML PPV (MISCELLANEOUS) ×12 IMPLANT
CEMENT HV SMART SET (Cement) ×6 IMPLANT
COVER BACK TABLE REUSABLE LG (DRAPES) ×3 IMPLANT
COVER WAND RF STERILE (DRAPES) ×3 IMPLANT
DRAPE 3/4 80X56 (DRAPES) ×3 IMPLANT
DRAPE INCISE IOBAN 66X60 STRL (DRAPES) ×3 IMPLANT
DRSG DERMACEA 8X12 NADH (GAUZE/BANDAGES/DRESSINGS) ×3 IMPLANT
DRSG OPSITE POSTOP 4X12 (GAUZE/BANDAGES/DRESSINGS) ×3 IMPLANT
DRSG OPSITE POSTOP 4X14 (GAUZE/BANDAGES/DRESSINGS) ×3 IMPLANT
DRSG TEGADERM 4X4.75 (GAUZE/BANDAGES/DRESSINGS) ×3 IMPLANT
DURAPREP 26ML APPLICATOR (WOUND CARE) ×6 IMPLANT
ELECT REM PT RETURN 9FT ADLT (ELECTROSURGICAL) ×3
ELECTRODE REM PT RTRN 9FT ADLT (ELECTROSURGICAL) ×1 IMPLANT
GAUZE PACK 2X3YD (GAUZE/BANDAGES/DRESSINGS) ×3 IMPLANT
GLOVE BIOGEL M STRL SZ7.5 (GLOVE) ×9 IMPLANT
GLOVE INDICATOR 8.0 STRL GRN (GLOVE) ×6 IMPLANT
GOWN STRL REUS W/ TWL LRG LVL3 (GOWN DISPOSABLE) ×2 IMPLANT
GOWN STRL REUS W/TWL LRG LVL3 (GOWN DISPOSABLE) ×4
HEAD FEM UNIPOLAR 47 OD STRL (Hips) ×3 IMPLANT
HEMOVAC 400CC 10FR (MISCELLANEOUS) ×3 IMPLANT
HOLDER FOLEY CATH W/STRAP (MISCELLANEOUS) ×3 IMPLANT
HOOD PEEL AWAY FLYTE STAYCOOL (MISCELLANEOUS) ×3 IMPLANT
IV NS 100ML SINGLE PACK (IV SOLUTION) ×3 IMPLANT
KIT TURNOVER KIT A (KITS) ×3 IMPLANT
MANIFOLD NEPTUNE II (INSTRUMENTS) ×3 IMPLANT
NDL SAFETY ECLIPSE 18X1.5 (NEEDLE) ×1 IMPLANT
NEEDLE FILTER BLUNT 18X 1/2SAF (NEEDLE) ×2
NEEDLE FILTER BLUNT 18X1 1/2 (NEEDLE) ×1 IMPLANT
NEEDLE HYPO 18GX1.5 SHARP (NEEDLE) ×2
NS IRRIG 1000ML POUR BTL (IV SOLUTION) ×3 IMPLANT
PACK HIP PROSTHESIS (MISCELLANEOUS) ×3 IMPLANT
PENCIL SMOKE ULTRAEVAC 22 CON (MISCELLANEOUS) ×3 IMPLANT
PRESSURIZER CEMENT PROX FEM SM (MISCELLANEOUS) ×3 IMPLANT
PRESSURIZER FEM CANAL M (MISCELLANEOUS) ×3 IMPLANT
PULSAVAC PLUS IRRIG FAN TIP (DISPOSABLE) ×3
RESTRICTOR CEMENT SZ 5 C-STEM (Cement) ×3 IMPLANT
SOL .9 NS 3000ML IRR  AL (IV SOLUTION) ×2
SOL .9 NS 3000ML IRR UROMATIC (IV SOLUTION) ×1 IMPLANT
SOL PREP PVP 2OZ (MISCELLANEOUS) ×3
SOLUTION PREP PVP 2OZ (MISCELLANEOUS) ×1 IMPLANT
SPACER FEM TAPERED +5 12/14 (Hips) ×3 IMPLANT
SPONGE DRAIN TRACH 4X4 STRL 2S (GAUZE/BANDAGES/DRESSINGS) ×3 IMPLANT
STAPLER SKIN PROX 35W (STAPLE) ×3 IMPLANT
STEM DIST FEM CENTRALIZR 13 (Hips) ×3 IMPLANT
STEM SUMMIT CEMENTED BASIC SZ3 (Hips) ×1 IMPLANT
SUMMIT CEMENT BASIC SZ3 (Hips) ×3 IMPLANT
SUT ETHIBOND #5 BRAIDED 30INL (SUTURE) ×3 IMPLANT
SUT VIC AB 0 CT1 36 (SUTURE) ×3 IMPLANT
SUT VIC AB 1 CT1 36 (SUTURE) ×6 IMPLANT
SUT VIC AB 2-0 CT1 27 (SUTURE) ×2
SUT VIC AB 2-0 CT1 TAPERPNT 27 (SUTURE) ×1 IMPLANT
SYR 20CC LL (SYRINGE) ×3 IMPLANT
SYR TB 1ML 27GX1/2 LL (SYRINGE) ×3 IMPLANT
TAPE TRANSPORE STRL 2 31045 (GAUZE/BANDAGES/DRESSINGS) ×3 IMPLANT
TIP BRUSH PULSAVAC PLUS 24.33 (MISCELLANEOUS) ×3 IMPLANT
TIP FAN IRRIG PULSAVAC PLUS (DISPOSABLE) ×1 IMPLANT
TOWER CARTRIDGE SMART MIX (DISPOSABLE) ×3 IMPLANT

## 2019-04-13 NOTE — Anesthesia Postprocedure Evaluation (Signed)
Anesthesia Post Note  Patient: Rhonda Griffith  Procedure(s) Performed: ARTHROPLASTY BIPOLAR HIP (HEMIARTHROPLASTY) (Right )  Patient location during evaluation: PACU Anesthesia Type: MAC and Spinal Level of consciousness: oriented and awake and alert Pain management: pain level controlled Vital Signs Assessment: post-procedure vital signs reviewed and stable Respiratory status: spontaneous breathing, respiratory function stable and patient connected to nasal cannula oxygen Cardiovascular status: blood pressure returned to baseline and stable Postop Assessment: no headache, no backache and no apparent nausea or vomiting Anesthetic complications: no     Last Vitals:  Vitals:   04/13/19 1354 04/13/19 1440  BP: (!) 145/67 (!) 155/47  Pulse: 60 (!) 57  Resp: 15 16  Temp:    SpO2: 100% 99%    Last Pain:  Vitals:   04/13/19 1359  TempSrc:   PainSc: (P) 6                  Alphonsus Sias

## 2019-04-13 NOTE — Anesthesia Procedure Notes (Signed)
Date/Time: 04/13/2019 8:30 AM Performed by: Allean Found, CRNA Pre-anesthesia Checklist: Patient identified, Emergency Drugs available, Suction available, Patient being monitored and Timeout performed Patient Re-evaluated:Patient Re-evaluated prior to induction Oxygen Delivery Method: Nasal cannula Placement Confirmation: positive ETCO2

## 2019-04-13 NOTE — Anesthesia Preprocedure Evaluation (Addendum)
Anesthesia Evaluation  Patient identified by MRN, date of birth, ID band Patient awake    Reviewed: Allergy & Precautions, H&P , NPO status , reviewed documented beta blocker date and time   Airway Mallampati: II  TM Distance: >3 FB Neck ROM: limited    Dental  (+) Missing, Chipped, Poor Dentition   Pulmonary shortness of breath, sleep apnea , former smoker,    Pulmonary exam normal        Cardiovascular hypertension, + Peripheral Vascular Disease  Atrial Fibrillation + pacemaker  Rhythm:regular  ECHO 01/16/2017   Normal LV function  No significant valve disease  Overall good study   Neuro/Psych PSYCHIATRIC DISORDERS Anxiety  Neuromuscular disease CVA    GI/Hepatic GERD  Controlled,  Endo/Other    Renal/GU Renal disease     Musculoskeletal  (+) Arthritis ,   Abdominal   Peds  Hematology   Anesthesia Other Findings Past Medical History: No date: Atrial fibrillation, permanent     Comment:  a. CHA2DS2VASc = 4-->coumadin;  b. 09/2014 Echo: EF               55-60%, normal wall motion, mild AI/AS, nl MV, mod dil               LA, mildly dil RA, mild to mod TR, PASP 71mHg. No date: Chronic kidney disease, stage I No date: CVA (cerebral infarction)     Comment:  a. 04/2015 - anticoagulation switched from eliquis to               xarelto to coumadin. No date: Leukocytosis No date: Pacemaker -[employer]     Comment:  a. implant for SSS No date: PAD (peripheral artery disease) ([facility]     Comment:  a. w/ left lower ext claudication s/p ABI's 04/2015               showing nl ABI on Right with abnl waveforms on left sugg               of L SFA dzs. 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 s/p mechanical valve replaced with bovine valve 2010.  Past Surgical History: No date: bladder injection No date: botox bladder No date: INSERT / REPLACE / REMOVE  PACEMAKER     Comment:  implant for SSS No date: MELANOMA EXCISION     Comment:  face No date: MITRAL VALVE REPLACEMENT     Comment:  s/p mechanical valve replaced with bovine valve 2010 No date: NASAL SINUS SURGERY 09/21/2018: PPM GENERATOR CHANGEOUT; N/A     Comment:  Procedure: PPM GENERATOR CHANGEOUT;  Surgeon: KDeboraha Sprang MD;  Location: [facility];  Service:               Cardiovascular;  Laterality: N/A;  BMI    Body Mass Index: 25.10 kg/m      Reproductive/Obstetrics                            Anesthesia Physical Anesthesia Plan  ASA: IV and emergent  Anesthesia Plan: Spinal and MAC   Post-op Pain Management:    Induction: Intravenous  PONV Risk Score and Plan: Treatment may vary due to age or medical condition and TIVA  Airway Management Planned: Nasal Cannula and  Natural Airway  Additional Equipment:   Intra-op Plan:   Post-operative Plan:   Informed Consent: I have reviewed the patients History and Physical, chart, labs and discussed the procedure including the risks, benefits and alternatives for the proposed anesthesia with the patient or authorized representative who has indicated his/her understanding and acceptance.     Dental Advisory Given  Plan Discussed with: CRNA  Anesthesia Plan Comments:         Anesthesia Quick Evaluation

## 2019-04-13 NOTE — Anesthesia Post-op Follow-up Note (Signed)
Anesthesia QCDR form completed.        

## 2019-04-13 NOTE — Plan of Care (Signed)
Patient found twice with o2 removed   Problem: Education: Goal: Knowledge of General Education information will improve Description: Including pain rating scale, medication(s)/side effects and non-pharmacologic comfort measures Outcome: Progressing   Problem: Pain Managment: Goal: General experience of comfort will improve Outcome: Progressing   Problem: Safety: Goal: Ability to remain free from injury will improve Outcome: Progressing   Problem: Education: Goal: Verbalization of understanding the information provided (i.e., activity precautions, restrictions, etc) will improve Outcome: Progressing Goal: Individualized Educational Video(s) Outcome: Progressing   Problem: Activity: Goal: Ability to ambulate and perform ADLs will improve Outcome: Progressing   Problem: Clinical Measurements: Goal: Postoperative complications will be avoided or minimized Outcome: Progressing   Problem: Self-Concept: Goal: Ability to maintain and perform role responsibilities to the fullest extent possible will improve Outcome: Progressing   Problem: Pain Management: Goal: Pain level will decrease Outcome: Progressing

## 2019-04-13 NOTE — Op Note (Signed)
OPERATIVE NOTE  DATE OF SURGERY:  04/13/2019  PATIENT NAME:  Rhonda Griffith   DOB: 02/20/1946  MRN: [id number]  PRE-OPERATIVE DIAGNOSIS: Right femoral neck fracture  POST-OPERATIVE DIAGNOSIS:  Same  PROCEDURE:  Right hip hemiarthroplasty  SURGEON:  Jena GaussJames P Randi College, Jr. M.D.  ANESTHESIA: spinal  ESTIMATED BLOOD LOSS: 100 mL  FLUIDS REPLACED: 1000 mL of crystalloid  DRAINS: 2 medium drains to a Hemovac reservoir  IMPLANTS UTILIZED: DePuy size 3 Summit femoral stem (cemented), 13 mm Cementralizer, 47 mm OD Cathcart hip ball, +5 mm tapered spacer, and a size 5 femoral cement restrictor  INDICATIONS FOR SURGERY: Rhonda Griffith is a 73 y.o. year old female who fell and sustained a displaced right femoral neck fracture. After discussion of the risks and benefits of surgical intervention, the patient expressed understanding of the risks benefits and agree with plans for hip hemiarthroplasty.   The risks, benefits, and alternatives were discussed at length including but not limited to the risks of infection, bleeding, nerve injury, stiffness, blood clots, the need for revision surgery, limb length inequality, dislocation, cardiopulmonary complications, among others, and they were willing to proceed.  PROCEDURE IN DETAIL: The patient was brought into the operating room and, after adequate spinal anesthesia was achieved, patient was placed in a left lateral decubitus position. Axillary roll was placed and all bony prominences were well-padded. The patient's right hip was cleaned and prepped with alcohol and DuraPrep and draped in the usual sterile fashion. A "timeout" was performed as per usual protocol. A lateral curvilinear incision was made gently curving towards the posterior superior iliac spine. The IT band was incised in line with the skin incision and the fibers of the gluteus maximus were split in line. The piriformis tendon was identified, skeletonized, and incised at  its insertion to the proximal femur and reflected posteriorly. A T type posterior capsulotomy was performed. The femoral head was then removed using a corkscrew device. The femoral head was measured using calipers and ring gauges and determined to be 47 mm in diameter.The femoral neck cut was performed using an oscillating saw. The acetabulum was inspected for any bony fragments. The articular surface was in good condition.  Attention was then directed to the proximal femur. A pilot hole for preparation of the proximal femoral canal was created using a high-speed bur. The femoral canal finder was inserted followed by insertion of the conical reamer. Serial broaches were inserted up to a size 3 broach. Calcar region was planed and a trial reduction was performed using a 47 mm OD Cathcart ball with a +5 mm neck length. Good equalization of limb lengths was appreciated and excellent stability was noted both anteriorly and posteriorly. Trial components were removed. The femoral canal was sized and was felt that a size 5 cement restrictor was appropriate. The cement restrictor was inserted to the appropriate depth in the femoral canal was irrigated with copious amounts of fluid using the pulse lavage and suctioned dry. The femoral canal was then packed with vaginal packing soaked in dilute Neo-Synephrine. Polymethylmethacrylate cement was prepared in the usual fashion using a vacuum mixer. Vaginal packing was removed and the canal again irrigated and suctioned dry. The polymethylmethacrylate cement was inserted in retrograde fashion and pressurized. The size 3 Summit femoral component with a 13 mm Cementralizer was positioned and impacted into place. Excess cement was removed using Personal assistantreer elevators. After adequate curing of the cement, the Morse taper was cleaned and dried. A 47 mm outer diameter Cathcart  hip ball with a +5 mm tapered spacer was placed on the trunnion and impacted into place. The acetabulum was again  irrigated and suctioned dry, making sure to inspect for any residal bony debris. The femoral head was then reduced and placed through a range of motion. Excellent stability was noted both anteriorly and posteriorly. Good equalization of limb lengths was appreciated.   The wound was irrigated with copious amounts of normal saline with antibiotic solution and suctioned dry. Good hemostasis was appreciated. The posterior capsulotomy was repaired using #5 Ethibond. Piriformis tendon was reapproximated to the undersurface of the gluteus medius tendon using #5 Ethibond. Two medium drains were placed in the wound bed and brought out through separate stab incisions to be attached to a Hemovac reservoir. The IT band was reapproximated using interrupted sutures of #1 Vicryl. Subcutaneous tissue was proximal phalanx using first #0 Vicryl followed by #2-0 Vicryl. The skin was closed with skin staples.  The patient tolerated the procedure well and was transported to the recovery room in stable condition.   Marciano Sequin., M.D.

## 2019-04-13 NOTE — Progress Notes (Signed)
[place] at [facility]                                                                                                                                                                                  Patient Demographics   Rhonda Griffith, is a 73 y.o. female, DOB - [birth year], GXQ:[id number]  Admit date - 04/10/2019   Admitting Physician Christel Mormon, MD  Outpatient Primary MD for the patient is Shapely-Quinn, Okey Regal, MD   LOS - 3  Subjective: Patient's INR is dropping 1.2 today states that her right hip pain is under control S/p right hip hemiarthroplasty- 04/13/19  Review of Systems:   CONSTITUTIONAL: No documented fever. No fatigue, weakness. No weight gain, no weight loss.  EYES: No blurry or double vision.  ENT: No tinnitus. No postnasal drip. No redness of the oropharynx.  RESPIRATORY: No cough, no wheeze, no hemoptysis. No dyspnea.  CARDIOVASCULAR: No chest pain. No orthopnea. No palpitations. No syncope.  GASTROINTESTINAL: No nausea, no vomiting or diarrhea. No abdominal pain. No melena or hematochezia.  GENITOURINARY: No dysuria or hematuria.  ENDOCRINE: No polyuria or nocturia. No heat or cold intolerance.  HEMATOLOGY: No anemia. No bruising. No bleeding.  INTEGUMENTARY: No rashes. No lesions.  MUSCULOSKELETAL: Right hip pain NEUROLOGIC: No numbness, tingling, or ataxia. No seizure-type activity.  PSYCHIATRIC: No anxiety. No insomnia. No ADD.    Vitals:   Vitals:   04/13/19 1327 04/13/19 1335 04/13/19 1354 04/13/19 1440  BP: (!) 159/66  (!) 145/67 (!) 155/47  Pulse: (!) 58 72 60 (!) 57  Resp: 15 10 15 16   Temp: 99.3 F (37.4 C)     TempSrc:      SpO2: 100% 100% 100% 99%  Weight:      Height:        Wt Readings from Last 3 Encounters:  04/10/19 77.1 kg  02/26/19 72.6 kg  09/21/18 74.8 kg     Intake/Output Summary (Last 24 hours) at 04/13/2019 1609 Last data filed at 04/13/2019 1334 Gross per 24 hour  Intake 2226.75 ml   Output 450 ml  Net 1776.75 ml    Physical Exam:   GENERAL: Pleasant-appearing in no apparent distress.  HEAD, EYES, EARS, NOSE AND THROAT: Atraumatic, normocephalic. Extraocular muscles are intact. Pupils equal and reactive to light. Sclerae anicteric. No conjunctival injection. No oro-pharyngeal erythema.  NECK: Supple. There is no jugular venous distention. No bruits, no lymphadenopathy, no thyromegaly.  HEART: Regular rate and rhythm,. No murmurs, no rubs, no clicks.  LUNGS: Clear to auscultation bilaterally. No rales or rhonchi. No wheezes.  ABDOMEN: Soft, flat, nontender, nondistended. Has good bowel sounds. No hepatosplenomegaly appreciated.  EXTREMITIES: No evidence of any cyanosis, clubbing, or peripheral edema.  +2 pedal and radial pulses bilaterally.  NEUROLOGIC: The patient is alert, awake, and oriented x3 with no focal motor or sensory deficits appreciated bilaterally.  SKIN: Moist and warm with no rashes appreciated.  Psych: Not anxious, depressed LN: No inguinal LN enlargement    Antibiotics   Anti-infectives (From admission, onward)   Start     Dose/Rate Route Frequency Ordered Stop   04/13/19 1400  clindamycin (CLEOCIN) IVPB 600 mg     600 mg 100 mL/hr over 30 Minutes Intravenous Every 6 hours 04/13/19 1356 04/14/19 0159   04/10/19 2330  clindamycin (CLEOCIN) IVPB 600 mg    Note to Pharmacy: SEND WITH THE PATIENT TO THE OR. DO NOT ADMINISTER ON THE UNIT!   600 mg 100 mL/hr over 30 Minutes Intravenous To Surgery 04/10/19 2243 04/11/19 2330      Medications   Scheduled Meds: . atorvastatin  80 mg Oral QPM  . cholecalciferol  2,000 Units Oral Q30 days  . darifenacin  7.5 mg Oral Daily  . diltiazem  120 mg Oral Daily  . DULoxetine  60 mg Oral QHS  . feeding supplement (ENSURE ENLIVE)  237 mL Oral BID BM  . fentaNYL      . ferrous sulfate  325 mg Oral BID WC  . fluticasone  2 spray Each Nare Daily  . gabapentin  100 mg Oral BID  . magnesium oxide  400 mg  Oral BID  . metoCLOPramide  10 mg Oral TID AC & HS  . pantoprazole  40 mg Oral Daily  . rOPINIRole  2 mg Oral QHS  . senna-docusate  1 tablet Oral BID  . sterile water (preservative free)      . ascorbic acid  500 mg Oral Daily  . warfarin  5 mg Oral ONCE-1800  . Warfarin - Pharmacist Dosing Inpatient   Does not apply q1800   Continuous Infusions: . sodium chloride 100 mL/hr at 04/13/19 1457  . acetaminophen    . clindamycin (CLEOCIN) IV 600 mg (04/13/19 1445)   PRN Meds:.[START ON 04/14/2019] acetaminophen, bisacodyl, diazepam, HYDROmorphone (DILAUDID) injection, magnesium hydroxide, menthol-cetylpyridinium **OR** phenol, metoCLOPramide **OR** metoCLOPramide (REGLAN) injection, ondansetron **OR** ondansetron (ZOFRAN) IV, oxyCODONE, oxyCODONE, sodium phosphate, traMADol   Data Review:   Micro Results Recent Results (from the past 240 hour(s))  SARS Coronavirus 2 (CEPHEID- Performed in [facility]), Hosp Order     Status: None   Collection Time: 04/10/19 11:19 PM   Specimen: Nasopharyngeal Swab  Result Value Ref Range Status   SARS Coronavirus 2 NEGATIVE NEGATIVE Final    Comment: (NOTE) If result is NEGATIVE SARS-CoV-2 target nucleic acids are NOT DETECTED. The SARS-CoV-2 RNA is generally detectable in upper and lower  respiratory specimens during the acute phase of infection. The lowest  concentration of SARS-CoV-2 viral copies this assay can detect is 250  copies / mL. A negative result does not preclude SARS-CoV-2 infection  and should not be used as the sole basis for treatment or other  patient management decisions.  A negative result may occur with  improper specimen collection / handling, submission of specimen other  than nasopharyngeal swab, presence of viral mutation(s) within the  areas targeted by this assay, and inadequate number of viral copies  (<250 copies / mL). A negative result must be combined with clinical  observations, patient history, and  epidemiological information. If result is POSITIVE SARS-CoV-2 target nucleic acids are DETECTED. The  SARS-CoV-2 RNA is generally detectable in upper and lower  respiratory specimens dur ing the acute phase of infection.  Positive  results are indicative of active infection with SARS-CoV-2.  Clinical  correlation with patient history and other diagnostic information is  necessary to determine patient infection status.  Positive results do  not rule out bacterial infection or co-infection with other viruses. If result is PRESUMPTIVE POSTIVE SARS-CoV-2 nucleic acids MAY BE PRESENT.   A presumptive positive result was obtained on the submitted specimen  and confirmed on repeat testing.  While 2019 novel coronavirus  (SARS-CoV-2) nucleic acids may be present in the submitted sample  additional confirmatory testing may be necessary for epidemiological  and / or clinical management purposes  to differentiate between  SARS-CoV-2 and other Sarbecovirus currently known to infect humans.  If clinically indicated additional testing with an alternate test  methodology [id number]) is advised. The SARS-CoV-2 RNA is generally  detectable in upper and lower respiratory sp ecimens during the acute  phase of infection. The expected result is Negative. Fact Sheet for Patients:  BoilerBrush.com.cyhttps://www.fda.gov/media/136312/download Fact Sheet for Healthcare Providers: https://pope.com/https://www.fda.gov/media/136313/download This test is not yet approved or cleared by the [place] FDA and has been authorized for detection and/or diagnosis of SARS-CoV-2 by FDA under an Emergency Use Authorization (EUA).  This EUA will remain in effect (meaning this test can be used) for the duration of the COVID-19 declaration under Section 564(b)(1) of the Act, 21 U.S.C. section 360bbb-3(b)(1), unless the authorization is terminated or revoked sooner. Performed at [facility], [address] [place], [place] [address]   MRSA PCR  Screening     Status: None   Collection Time: 04/11/19  2:35 AM   Specimen: Nasal Mucosa; Nasopharyngeal  Result Value Ref Range Status   MRSA by PCR NEGATIVE NEGATIVE Final    Comment:        The GeneXpert MRSA Assay (FDA approved for NASAL specimens only), is one component of a comprehensive MRSA colonization surveillance program. It is not intended to diagnose MRSA infection nor to guide or monitor treatment for MRSA infections. Performed at [facility], [address] [place], [place] [address]     Radiology Reports Dg Chest 1 View  Result Date: 04/09/2019 CLINICAL DATA:  Right hip pain after fall. EXAM: CHEST  1 VIEW COMPARISON:  Radiographs of March 24, 2018. FINDINGS: Stable cardiomegaly. Status post cardiac valve repair. Left-sided pacemaker is unchanged in position. Atherosclerosis of thoracic aorta is noted. Both lungs are clear. The visualized skeletal structures are unremarkable. IMPRESSION: No active disease. Aortic Atherosclerosis (ICD10-I70.0). Electronically Signed   By: Lupita RaiderJames  Green Jr M.D.   On: 04/09/2019 16:14   Ct Head Wo Contrast  Result Date: 04/10/2019 CLINICAL DATA:  73 y/o  F; fall with right hip pain. EXAM: CT HEAD WITHOUT CONTRAST TECHNIQUE: Contiguous axial images were obtained from the base of the skull through the vertex without intravenous contrast. COMPARISON:  08/03/2018 CT head. FINDINGS: Brain: No evidence of acute infarction, hemorrhage, extra-axial collection or mass lesion/mass effect. Stable chronic infarcts in right basal ganglia, left thalamus, and right cerebellum. Stable chronic hydrocephalus. Stable chronic microvascular ischemic changes of white matter and volume loss of the brain. Vascular: Calcific atherosclerosis of internal carotid arteries. No hyperdense vessel. Skull: Normal. Negative for fracture or focal lesion. Sinuses/Orbits: No acute finding. Other: None. IMPRESSION: 1. No acute intracranial abnormality identified. 2.  Stable chronic microvascular ischemic changes and volume loss of the brain. 3. Stable chronic infarcts  in right basal ganglia, left thalamus, and right cerebellum. 4. Stable chronic hydrocephalus. Electronically Signed   By: Mitzi Hansen M.D.   On: 04/10/2019 22:13   Ct Hip Right Wo Contrast  Result Date: 04/09/2019 CLINICAL DATA:  Right hip pain status post fall EXAM: CT OF THE RIGHT HIP WITHOUT CONTRAST TECHNIQUE: Multidetector CT imaging of the right hip was performed according to the standard protocol. Multiplanar CT image reconstructions were also generated. COMPARISON:  None. FINDINGS: Bones/Joint/Cartilage No fracture or dislocation. Normal alignment. No joint effusion. Mild osteoarthritis of the right hip. Heterogeneous marrow of the proximal femoral diaphysis which may reflect red marrow or hemorrhage. Ligaments Ligaments are suboptimally evaluated by CT. Muscles and Tendons Muscles are normal.  No intramuscular fluid collection. Soft tissue No fluid collection or hematoma.  No soft tissue mass. IMPRESSION: No acute osseous injury of the right hip. Heterogeneous marrow of the proximal femoral diaphysis which may reflect red marrow or hemorrhage. Recommend further evaluation with MR of the femur. Electronically Signed   By: Elige Ko   On: 04/09/2019 17:10   Dg Chest Portable 1 View  Result Date: 04/10/2019 CLINICAL DATA:  Status post fall with right hip pain. EXAM: PORTABLE CHEST 1 VIEW COMPARISON:  April 09, 2019 FINDINGS: The heart size and mediastinal contours are stable. Cardiac valvular replacement ring and cardiac pacemaker are unchanged. The heart size is enlarged. The lungs are hyperinflated. There is no focal infiltrate, pulmonary edema, or pleural effusion. The visualized skeletal structures are unremarkable. IMPRESSION: No active cardiopulmonary disease. Electronically Signed   By: Sherian Rein M.D.   On: 04/10/2019 21:48   Dg Hip Port Unilat With Pelvis 1v  Right  Result Date: 04/13/2019 CLINICAL DATA:  Postop right hip arthroplasty. EXAM: DG HIP (WITH OR WITHOUT PELVIS) 1V PORT RIGHT COMPARISON:  04/10/2019 FINDINGS: Evidence of patient's recent interval unipolar right hip arthroplasty with prosthesis intact and normally positioned. Surgical drain over the right hip joint and adjacent skin staples over the right hip. Remainder of the exam is unchanged. IMPRESSION: Expected changes post right hip arthroplasty. Electronically Signed   By: Elberta Fortis M.D.   On: 04/13/2019 13:35   Dg Hip Unilat W Or Wo Pelvis 2-3 Views Right  Result Date: 04/10/2019 CLINICAL DATA:  Status post fall with right hip pain. EXAM: DG HIP (WITH OR WITHOUT PELVIS) 2-3V RIGHT COMPARISON:  April 09, 2019 FINDINGS: There is deformity of the proximal right femur in the femoral neck region suspicious for fracture. No other acute fracture dislocation is identified. IMPRESSION: There is deformity of the proximal right femur in the femoral neck region suspicious for fracture. Electronically Signed   By: Sherian Rein M.D.   On: 04/10/2019 21:47   Dg Hip Unilat W Or Wo Pelvis 2-3 Views Right  Result Date: 04/09/2019 CLINICAL DATA:  Right hip pain after fall. EXAM: DG HIP (WITH OR WITHOUT PELVIS) 2-3V RIGHT COMPARISON:  None. FINDINGS: There is no evidence of hip fracture or dislocation. There is no evidence of arthropathy or other focal bone abnormality. IMPRESSION: Negative. Electronically Signed   By: Lupita Raider M.D.   On: 04/09/2019 16:12     CBC Recent Labs  Lab 04/10/19 2051 04/11/19 0253 04/13/19 0438  WBC 9.2 10.2 8.5  HGB 12.0 10.7* 10.2*  HCT 38.6 34.2* 33.0*  PLT 277 216 200  MCV 98.0 97.2 99.4  MCH 30.5 30.4 30.7  MCHC 31.1 31.3 30.9  RDW 12.6 12.7 12.2  LYMPHSABS 1.3  --   --  MONOABS 0.8  --   --   EOSABS 0.2  --   --   BASOSABS 0.1  --   --     Chemistries  Recent Labs  Lab 04/10/19 2051 04/11/19 0253 04/13/19 0438  NA 137 137 139  K 4.1 4.8  4.9  CL 104 105 103  CO2 26 26 29   GLUCOSE 98 108* 123*  BUN 20 22 32*  CREATININE 1.07* 1.38* 1.20*  CALCIUM 10.0 9.4 9.1  AST 19  --   --   ALT 12  --   --   ALKPHOS 99  --   --   BILITOT 1.0  --   --    ------------------------------------------------------------------------------------------------------------------ estimated creatinine clearance is 42.3 mL/min (A) (by C-G formula based on SCr of 1.2 mg/dL (H)). ------------------------------------------------------------------------------------------------------------------ No results for input(s): HGBA1C in the last 72 hours. ------------------------------------------------------------------------------------------------------------------ No results for input(s): CHOL, HDL, LDLCALC, TRIG, CHOLHDL, LDLDIRECT in the last 72 hours. ------------------------------------------------------------------------------------------------------------------ No results for input(s): TSH, T4TOTAL, T3FREE, THYROIDAB in the last 72 hours.  Invalid input(s): FREET3 ------------------------------------------------------------------------------------------------------------------ No results for input(s): VITAMINB12, FOLATE, FERRITIN, TIBC, IRON, RETICCTPCT in the last 72 hours.  Coagulation profile Recent Labs  Lab 04/10/19 2051 04/11/19 0253 04/12/19 0425 04/13/19 0438  INR 2.4* 2.8* 1.4* 1.2    No results for input(s): DDIMER in the last 72 hours.  Cardiac Enzymes No results for input(s): CKMB, TROPONINI, MYOGLOBIN in the last 168 hours.  Invalid input(s): CK ------------------------------------------------------------------------------------------------------------------ Invalid input(s): POCBNP    Assessment & Plan   1.  right hip fracture - s/p right hemiarthroplasty 04/13/19 -Pain management as per orthopedic team -We will give Lovenox subcu as now INR is subtherapeutic.  2.  Atrial fibrillation - Also with a history of  mitral valve and aortic valve stenosis as well as pacemaker placement -  Held Coumadin for surgery, resume postop   3.  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  CKD - Will monitor renal function closely. -Renal function creatinine has increased will continue IV fluids  5.  Peripheral artery disease     Code Status Orders  (From admission, onward)         Start     Ordered   04/10/19 2246  Full code  Continuous     04/10/19 2245        Code Status History    Date Active Date Inactive Code Status Order ID Comments User Context   09/21/2018 1355 09/21/2018 1827 Full Code [id number]  Duke SalviaKlein, Steven C, MD Inpatient   Advance Care Planning Activity       Consults orthopedics  DVT Prophylaxis SCDs  Lab Results  Component Value Date   PLT 200 04/13/2019     Time Spent in minutes 35 minutes Altamese DillingVaibhavkumar Bentlee Drier M.D on 04/13/2019 at 4:09 PM  Between 7am to 6pm - Pager - [phone]  After 6pm go to [email or web address] - Social research officer, governmentpassword [email or web address]  [employer]   Office  [phone]

## 2019-04-13 NOTE — Progress Notes (Signed)
[facility] for warfarin Indication: atrial fibrillation  Allergies  Allergen Reactions  . Morphine Nausea And Vomiting  . Other Other (See Comments)    Several different statins.  . Statins Other (See Comments)    Several different statins.  . Tizanidine Palpitations  . Tape Other (See Comments)    Breaks her skin Allergic to plastic/latex tape. Only use paper tape on patient.  . Tapentadol Other (See Comments)  . Cephalexin Rash  . Latex Rash  . Sulfa Antibiotics Rash and Other (See Comments)    Patient Measurements: Height: 5\' 9"  (175.3 cm) Weight: 170 lb (77.1 kg) IBW/kg (Calculated) : 66.2   Vital Signs: Temp: 97.4 F (36.3 C) (07/18 1230) BP: 163/62 (07/18 1246) Pulse Rate: 62 (07/18 1246)  Labs: Recent Labs    04/10/19 2051 04/11/19 0253 04/12/19 0425 04/13/19 0438  HGB 12.0 10.7*  --  10.2*  HCT 38.6 34.2*  --  33.0*  PLT 277 216  --  200  APTT  --  46*  --   --   LABPROT 26.0* 28.7* 17.4* 15.0  INR 2.4* 2.8* 1.4* 1.2  CREATININE 1.07* 1.38*  --  1.20*    Estimated Creatinine Clearance: 42.3 mL/min (A) (by C-G formula based on SCr of 1.2 mg/dL (H)).   Medical History: Past Medical History:  Diagnosis Date  . Atrial fibrillation, permanent    a. CHA2DS2VASc = 4-->coumadin;  b. 09/2014 Echo: EF 55-60%, normal wall motion, mild AI/AS, nl MV, mod dil LA, mildly dil RA, mild to mod TR, PASP 20mmHg.  [name] Chronic kidney disease, stage I   . CVA (cerebral infarction)    a. 04/2015 - anticoagulation switched from eliquis to xarelto to coumadin.  . Leukocytosis   . Pacemaker -Medtronic    a. implant for SSS  . PAD (peripheral artery disease) ([place])    a. w/ left lower ext claudication s/p ABI's 04/2015 showing nl ABI on Right with abnl waveforms on left sugg of L SFA dzs.  [name] Restless leg syndrome   . 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     Assessment: 73 year old female presented with hip pain, now s/p right hip hemiarthroplasty after right femoral neck fracture. Patient on warfarin PTA for afib. Home dose of 2.5 mg all days except 5 mg on Tuesday. Pharmacy consulted to dose warfarin. INR now sub-therapeutic after receiving Vit K prior to surgery.  Date INR Dose 7/18 1.2  Goal of Therapy:  INR 2-3 Monitor platelets by anticoagulation protocol: Yes   Plan:  Will give one time dose of warfarin 5 mg today at 1800.  Tawnya Crook, PharmD Clinical Pharmacist 04/13/2019,1:04 PM

## 2019-04-13 NOTE — Anesthesia Procedure Notes (Signed)
Spinal  Patient location during procedure: OR Start time: 04/13/2019 8:40 AM End time: 04/13/2019 8:47 AM Staffing Anesthesiologist: Alphonsus Sias, MD Performed: anesthesiologist  Preanesthetic Checklist Completed: patient identified, site marked, surgical consent, pre-op evaluation, timeout performed, IV checked, risks and benefits discussed and monitors and equipment checked Spinal Block Patient position: left lateral decubitus Prep: ChloraPrep Patient monitoring: heart rate, cardiac monitor, continuous pulse ox and blood pressure Approach: left paramedian Location: L3-4 Needle Needle type: Pencil-Tip and Pencan  Needle gauge: 24 G Needle length: 10 cm Needle insertion depth: 7 cm Additional Notes Hypobaric 0.3% bupiv, paramedian, clear CSF on 2nd pass, easy injection. Tol well

## 2019-04-13 NOTE — Transfer of Care (Signed)
Immediate Anesthesia Transfer of Care Note  Patient: Rhonda Griffith  Procedure(s) Performed: ARTHROPLASTY BIPOLAR HIP (HEMIARTHROPLASTY) (Right )  Patient Location: PACU  Anesthesia Type:Spinal  Level of Consciousness: sedated  Airway & Oxygen Therapy: Patient Spontanous Breathing and Patient connected to face mask oxygen  Post-op Assessment: Report given to RN and Post -op Vital signs reviewed and stable  Post vital signs: Reviewed and stable  Last Vitals:  Vitals Value Taken Time  BP 152/44 04/13/19 1231  Temp    Pulse 64 04/13/19 1231  Resp 15 04/13/19 1234  SpO2 100 % 04/13/19 1231  Vitals shown include unvalidated device data.  Last Pain:  Vitals:   04/13/19 0611  TempSrc:   PainSc: 8          Complications: No apparent anesthesia complications

## 2019-04-14 ENCOUNTER — Encounter: Payer: Self-pay | Admitting: *Deleted

## 2019-04-14 ENCOUNTER — Encounter: Payer: Self-pay | Admitting: Cardiology

## 2019-04-14 LAB — PROTIME-INR
INR: 1.4 — ABNORMAL HIGH (ref 0.8–1.2)
Prothrombin Time: 16.8 seconds — ABNORMAL HIGH (ref 11.4–15.2)

## 2019-04-14 MED ORDER — WARFARIN SODIUM 2.5 MG PO TABS
2.5000 mg | ORAL_TABLET | Freq: Once | ORAL | Status: AC
  Administered 2019-04-14: 2.5 mg via ORAL
  Filled 2019-04-14: qty 1

## 2019-04-14 NOTE — Progress Notes (Signed)
Patient noted to have intermittent confusion, Asking questions such as "when do I go to my room?" while in bed. A+Ox4 the other times. Patient states she feels confused.

## 2019-04-14 NOTE — Evaluation (Signed)
Physical Therapy Evaluation Patient Details Name: Rhonda Griffith MRN: [id number] DOB: 01/03/1946 Today's Date: 04/14/2019   History of Present Illness  73 yo female with onset of fall and resulting R intertrochanteric fracture was admitted for hemiarthroplasty with assumed posterior precautions, WBAT.  PMHx:  Pacemaker, CVA, leukocytosis, RLS, CKD, PAD, rheumatic mitral and aortic valve stenosis, a-fib  Clinical Impression  Pt was seen for mobility and strengthening with a limit to tolerance for standing related to struggle to use LE's to stand.  Had O2 in place 2L but has not been on oxygen at home.  Pt was left on O2 per nsg, and note 98% in bed and 100% sat sitting on side of bed.  Pt was returned to bed since she could not assist to scoot or fully stand, and will reattempt this at another time with a second staff member.  Follow acutely for goals of increasing standing tolerance and for instruction of precautions with training to use them with all bed mob and transfers.    Follow Up Recommendations SNF    Equipment Recommendations  None recommended by PT    Recommendations for Other Services       Precautions / Restrictions Precautions Precautions: Fall Restrictions Weight Bearing Restrictions: No RLE Weight Bearing: Weight bearing as tolerated Other Position/Activity Restrictions: posterior hip precautions      Mobility  Bed Mobility Overal bed mobility: Needs Assistance Bed Mobility: Supine to Sit;Sit to Supine     Supine to sit: Max assist;Mod assist Sit to supine: Mod assist   General bed mobility comments: mod to help RLE but then max for lifting trunk and scooting to EOB, then mod back to mainly assist LE's  Transfers Overall transfer level: Needs assistance Equipment used: Rolling walker (2 wheeled);1 person hand held assist Transfers: Sit to/from Stand Sit to Stand: Total assist         General transfer comment: pt is not able to activate LE's to  assist the attempt  Ambulation/Gait             General Gait Details: unable  Stairs            Wheelchair Mobility    Modified Rankin (Stroke Patients Only)       Balance Overall balance assessment: Needs assistance;History of Falls Sitting-balance support: Feet supported Sitting balance-Leahy Scale: Fair       Standing balance-Leahy Scale: Zero                               Pertinent Vitals/Pain Pain Assessment: Faces Faces Pain Scale: Hurts even more Pain Location: R hip  Pain Descriptors / Indicators: Operative site guarding Pain Intervention(s): Limited activity within patient's tolerance;Monitored during session;Repositioned;Ice applied;RN gave pain meds during session    Home Living Family/patient expects to be discharged to:: Private residence Living Arrangements: Children Available Help at Discharge: Family;Available 24 hours/day Type of Home: House Home Access: Stairs to enter Entrance Stairs-Rails: Right;Left;Can reach both Entrance Stairs-Number of Steps: 5 Home Layout: One level Home Equipment: Walker - 4 wheels      Prior Function Level of Independence: Independent with assistive device(s)         Comments: history of falls, used Rollator     Hand Dominance   Dominant Hand: Right    Extremity/Trunk Assessment   Upper Extremity Assessment Upper Extremity Assessment: Generalized weakness    Lower Extremity Assessment Lower Extremity Assessment: RLE deficits/detail RLE Deficits /  Details: R hemiarthroplasty with precautions RLE Coordination: decreased fine motor;decreased gross motor    Cervical / Trunk Assessment Cervical / Trunk Assessment: Kyphotic  Communication   Communication: No difficulties  Cognition Arousal/Alertness: Awake/alert Behavior During Therapy: Flat affect Overall Cognitive Status: No family/caregiver present to determine baseline cognitive functioning                                  General Comments: pt is somewhat slow to follow instructions, but is able to give some home details      General Comments General comments (skin integrity, edema, etc.): Pt was up to side of bed with help, has drain in place, ice was on R hip and nursing in to give meds.  Pt was 98% O2 sat in bed and 100% sitting side of bed    Exercises     Assessment/Plan    PT Assessment Patient needs continued PT services  PT Problem List Decreased strength;Decreased range of motion;Decreased activity tolerance;Decreased balance;Decreased mobility;Decreased coordination;Decreased safety awareness;Decreased skin integrity;Pain;Decreased knowledge of use of DME       PT Treatment Interventions DME instruction;Gait training;Stair training;Functional mobility training;Therapeutic activities;Therapeutic exercise;Balance training;Neuromuscular re-education;Patient/family education    PT Goals (Current goals can be found in the Care Plan section)  Acute Rehab PT Goals Patient Stated Goal: to go home and continue to feel better PT Goal Formulation: With patient Time For Goal Achievement: 04/28/19 Potential to Achieve Goals: Good    Frequency BID   Barriers to discharge Inaccessible home environment has 5 steps to enter house    Co-evaluation               AM-PAC PT "6 Clicks" Mobility  Outcome Measure Help needed turning from your back to your side while in a flat bed without using bedrails?: A Lot Help needed moving from lying on your back to sitting on the side of a flat bed without using bedrails?: A Lot Help needed moving to and from a bed to a chair (including a wheelchair)?: A Lot Help needed standing up from a chair using your arms (e.g., wheelchair or bedside chair)?: Total Help needed to walk in hospital room?: Total Help needed climbing 3-5 steps with a railing? : Total 6 Click Score: 9    End of Session Equipment Utilized During Treatment: Gait belt;Oxygen Activity  Tolerance: Patient limited by fatigue;Patient limited by pain Patient left: in bed;with call bell/phone within reach;with bed alarm set Nurse Communication: Mobility status PT Visit Diagnosis: Muscle weakness (generalized) (M62.81);Pain Pain - Right/Left: Right Pain - part of body: Hip    Time: 1610-9604 PT Time Calculation (min) (ACUTE ONLY): 26 min   Charges:   PT Evaluation $PT Eval Moderate Complexity: 1 Mod PT Treatments $Therapeutic Activity: 8-22 mins       Ramond Dial 04/14/2019, 1:14 PM   Mee Hives, PT MS Acute Rehab Dept. Number: [place] [facility] [place]

## 2019-04-14 NOTE — Plan of Care (Signed)
Patient reporting more pain this shift. She is wide awake watching TV. Needs to be reminded to keep oxygen on.   Problem: Education: Goal: Knowledge of General Education information will improve Description: Including pain rating scale, medication(s)/side effects and non-pharmacologic comfort measures Outcome: Progressing   Problem: Pain Managment: Goal: General experience of comfort will improve Outcome: Progressing   Problem: Safety: Goal: Ability to remain free from injury will improve Outcome: Progressing   Problem: Education: Goal: Verbalization of understanding the information provided (i.e., activity precautions, restrictions, etc) will improve Outcome: Progressing Goal: Individualized Educational Video(s) Outcome: Progressing   Problem: Activity: Goal: Ability to ambulate and perform ADLs will improve Outcome: Progressing   Problem: Clinical Measurements: Goal: Postoperative complications will be avoided or minimized Outcome: Progressing   Problem: Self-Concept: Goal: Ability to maintain and perform role responsibilities to the fullest extent possible will improve Outcome: Progressing   Problem: Pain Management: Goal: Pain level will decrease Outcome: Progressing

## 2019-04-14 NOTE — Progress Notes (Signed)
Rhonda Griffith for warfarin Indication: atrial fibrillation  Allergies  Allergen Reactions  . Morphine Nausea And Vomiting  . Other Other (See Comments)    Several different statins.  . Statins Other (See Comments)    Several different statins.  . Tizanidine Palpitations  . Tape Other (See Comments)    Breaks her skin Allergic to plastic/latex tape. Only use paper tape on patient.  . Tapentadol Other (See Comments)  . Cephalexin Rash  . Latex Rash  . Sulfa Antibiotics Rash and Other (See Comments)    Patient Measurements: Height: 5\' 9"  (175.3 cm) Weight: 170 lb (77.1 kg) IBW/kg (Calculated) : 66.2   Vital Signs: Temp: 98.3 F (36.8 C) (07/19 1156) Temp Source: Oral (07/19 1156) BP: 134/54 (07/19 1156) Pulse Rate: 70 (07/19 1156)  Labs: Recent Labs    04/12/19 0425 04/13/19 0438 04/14/19 0530  HGB  --  10.2*  --   HCT  --  33.0*  --   PLT  --  200  --   LABPROT 17.4* 15.0 16.8*  INR 1.4* 1.2 1.4*  CREATININE  --  1.20*  --     Estimated Creatinine Clearance: 42.3 mL/min (A) (by C-G formula based on SCr of 1.2 mg/dL (H)).   Medical History: Past Medical History:  Diagnosis Date  . Atrial fibrillation, permanent    a. CHA2DS2VASc = 4-->coumadin;  b. 09/2014 Echo: EF 55-60%, normal wall motion, mild AI/AS, nl MV, mod dil LA, mildly dil RA, mild to mod TR, PASP 70mmHg.  [name] Chronic kidney disease, stage I   . CVA (cerebral infarction)    a. 04/2015 - anticoagulation switched from eliquis to xarelto to coumadin.  . Leukocytosis   . Pacemaker -[employer]tronic    a. implant for SSS  . PAD (peripheral artery disease) ([place])    a. w/ left lower ext claudication s/p ABI's 04/2015 showing nl ABI on Right with abnl waveforms on left sugg of L SFA dzs.  [name] Restless leg syndrome   . 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    Assessment: 73 year old female presented with hip  pain, now s/p right hip hemiarthroplasty after right femoral neck fracture. Patient on warfarin PTA for afib. Home dose of 2.5 mg all days except 5 mg on Tuesday. Pharmacy consulted to dose warfarin. INR now sub-therapeutic after receiving Vit K prior to surgery.  Date INR Dose 7/18 1.2 5mg  7/19 1.4 2.5mg    Goal of Therapy:  INR 2-3 Monitor platelets by anticoagulation protocol: Yes   Plan:  Will order warfarin 2.5mg  x 1 as per home regimen. Recommend continuing to bridge with enoxaparin for minimum of 5 days.   Pharmacy will continue to monitor and adjust per consult.   Alicea Wente L, RPh 04/14/2019,12:40 PM

## 2019-04-14 NOTE — Progress Notes (Signed)
[employer] [facility] - [place] at [facility]                                                                                                                                                                                  Patient Demographics   Rhonda LauthVirginia Griffith, is a 73 y.o. female, DOB - 02/20/1946, NFA:[id number]  Admit date - 04/10/2019   Admitting Physician Hannah BeatJan A Mansy, MD  Outpatient Primary MD for the patient is Shapely-Quinn, Desiree Lucyodd Parcelas de Navarro, MD   LOS - 4  Subjective: Patient's INR is dropping 1.2 today states that her right hip pain is under control S/p right hip hemiarthroplasty- 04/13/19  Review of Systems:   CONSTITUTIONAL: No documented fever. No fatigue, weakness. No weight gain, no weight loss.  EYES: No blurry or double vision.  ENT: No tinnitus. No postnasal drip. No redness of the oropharynx.  RESPIRATORY: No cough, no wheeze, no hemoptysis. No dyspnea.  CARDIOVASCULAR: No chest pain. No orthopnea. No palpitations. No syncope.  GASTROINTESTINAL: No nausea, no vomiting or diarrhea. No abdominal pain. No melena or hematochezia.  GENITOURINARY: No dysuria or hematuria.  ENDOCRINE: No polyuria or nocturia. No heat or cold intolerance.  HEMATOLOGY: No anemia. No bruising. No bleeding.  INTEGUMENTARY: No rashes. No lesions.  MUSCULOSKELETAL: Right hip pain NEUROLOGIC: No numbness, tingling, or ataxia. No seizure-type activity.  PSYCHIATRIC: No anxiety. No insomnia. No ADD.    Vitals:   Vitals:   04/13/19 2341 04/14/19 0634 04/14/19 0745 04/14/19 1156  BP: (!) 128/47  (!) 134/50 (!) 134/54  Pulse: 67 62 61 70  Resp: 14  17 15   Temp: 97.8 F (36.6 C)  98.1 F (36.7 C) 98.3 F (36.8 C)  TempSrc:   Oral Oral  SpO2: 99% 100% 99% 98%  Weight:      Height:        Wt Readings from Last 3 Encounters:  04/10/19 77.1 kg  02/26/19 72.6 kg  09/21/18 74.8 kg     Intake/Output Summary (Last 24 hours) at 04/14/2019 1334 Last data filed at 04/14/2019 1016 Gross per  24 hour  Intake 1143.47 ml  Output 2475 ml  Net -1331.53 ml    Physical Exam:   GENERAL: Pleasant-appearing in no apparent distress.  HEAD, EYES, EARS, NOSE AND THROAT: Atraumatic, normocephalic. Extraocular muscles are intact. Pupils equal and reactive to light. Sclerae anicteric. No conjunctival injection. No oro-pharyngeal erythema.  NECK: Supple. There is no jugular venous distention. No bruits, no lymphadenopathy, no thyromegaly.  HEART: Regular rate and rhythm,. No murmurs, no rubs, no clicks.  LUNGS: Clear to auscultation bilaterally. No rales or rhonchi. No wheezes.  ABDOMEN: Soft, flat, nontender, nondistended. Has good bowel  sounds. No hepatosplenomegaly appreciated.  EXTREMITIES: No evidence of any cyanosis, clubbing, or peripheral edema.  +2 pedal and radial pulses bilaterally.  NEUROLOGIC: The patient is alert, awake, and oriented x3 with no focal motor or sensory deficits appreciated bilaterally.  SKIN: Moist and warm with no rashes appreciated.  Psych: Not anxious, depressed LN: No inguinal LN enlargement    Antibiotics   Anti-infectives (From admission, onward)   Start     Dose/Rate Route Frequency Ordered Stop   04/13/19 1400  clindamycin (CLEOCIN) IVPB 600 mg     600 mg 100 mL/hr over 30 Minutes Intravenous Every 6 hours 04/13/19 1356 04/13/19 2043   04/10/19 2330  clindamycin (CLEOCIN) IVPB 600 mg    Note to Pharmacy: [place] TO THE OR. DO NOT ADMINISTER ON THE UNIT!   600 mg 100 mL/hr over 30 Minutes Intravenous To Surgery 04/10/19 2243 04/11/19 2330      Medications   Scheduled Meds: . atorvastatin  80 mg Oral QPM  . cholecalciferol  2,000 Units Oral Q30 days  . darifenacin  7.5 mg Oral Daily  . diltiazem  120 mg Oral Daily  . DULoxetine  60 mg Oral QHS  . enoxaparin (LOVENOX) injection  40 mg Subcutaneous Q24H  . feeding supplement (ENSURE ENLIVE)  237 mL Oral BID BM  . ferrous sulfate  325 mg Oral BID WC  . fluticasone  2 spray Each  Nare Daily  . gabapentin  100 mg Oral BID  . magnesium oxide  400 mg Oral BID  . metoCLOPramide  10 mg Oral TID AC & HS  . pantoprazole  40 mg Oral Daily  . rOPINIRole  2 mg Oral QHS  . senna-docusate  1 tablet Oral BID  . ascorbic acid  500 mg Oral Daily  . warfarin  2.5 mg Oral ONCE-1800  . Warfarin - Pharmacist Dosing Inpatient   Does not apply q1800   Continuous Infusions:  PRN Meds:.acetaminophen, bisacodyl, diazepam, HYDROmorphone (DILAUDID) injection, magnesium hydroxide, menthol-cetylpyridinium **OR** phenol, metoCLOPramide **OR** metoCLOPramide (REGLAN) injection, ondansetron **OR** ondansetron (ZOFRAN) IV, oxyCODONE, oxyCODONE, sodium phosphate, traMADol   Data Review:   Micro Results Recent Results (from the past 240 hour(s))  SARS Coronavirus 2 (CEPHEID- Performed in [place] hospital lab), Hosp Order     Status: None   Collection Time: 04/10/19 11:19 PM   Specimen: Nasopharyngeal Swab  Result Value Ref Range Status   SARS Coronavirus 2 NEGATIVE NEGATIVE Final    Comment: (NOTE) If result is NEGATIVE SARS-CoV-2 target nucleic acids are NOT DETECTED. The SARS-CoV-2 RNA is generally detectable in upper and lower  respiratory specimens during the acute phase of infection. The lowest  concentration of SARS-CoV-2 viral copies this assay can detect is 250  copies / mL. A negative result does not preclude SARS-CoV-2 infection  and should not be used as the sole basis for treatment or other  patient management decisions.  A negative result may occur with  improper specimen collection / handling, submission of specimen other  than nasopharyngeal swab, presence of viral mutation(s) within the  areas targeted by this assay, and inadequate number of viral copies  (<250 copies / mL). A negative result must be combined with clinical  observations, patient history, and epidemiological information. If result is POSITIVE SARS-CoV-2 target nucleic acids are DETECTED. The  SARS-CoV-2 RNA is generally detectable in upper and lower  respiratory specimens dur ing the acute phase of infection.  Positive  results are indicative of active infection  with SARS-CoV-2.  Clinical  correlation with patient history and other diagnostic information is  necessary to determine patient infection status.  Positive results do  not rule out bacterial infection or co-infection with other viruses. If result is PRESUMPTIVE POSTIVE SARS-CoV-2 nucleic acids MAY BE PRESENT.   A presumptive positive result was obtained on the submitted specimen  and confirmed on repeat testing.  While 2019 novel coronavirus  (SARS-CoV-2) nucleic acids may be present in the submitted sample  additional confirmatory testing may be necessary for epidemiological  and / or clinical management purposes  to differentiate between  SARS-CoV-2 and other Sarbecovirus currently known to infect humans.  If clinically indicated additional testing with an alternate test  methodology [id number]) is advised. The SARS-CoV-2 RNA is generally  detectable in upper and lower respiratory sp ecimens during the acute  phase of infection. The expected result is Negative. Fact Sheet for Patients:  BoilerBrush.com.cyhttps://www.fda.gov/media/136312/download Fact Sheet for Healthcare Providers: https://pope.com/https://www.fda.gov/media/136313/download This test is not yet approved or cleared by the [place] FDA and has been authorized for detection and/or diagnosis of SARS-CoV-2 by FDA under an Emergency Use Authorization (EUA).  This EUA will remain in effect (meaning this test can be used) for the duration of the COVID-19 declaration under Section 564(b)(1) of the Act, 21 U.S.C. section 360bbb-3(b)(1), unless the authorization is terminated or revoked sooner. Performed at [facility], [address] [place], [place] [address]   MRSA PCR Screening     Status: None   Collection Time: 04/11/19  2:35 AM   Specimen: Nasal Mucosa;  Nasopharyngeal  Result Value Ref Range Status   MRSA by PCR NEGATIVE NEGATIVE Final    Comment:        The GeneXpert MRSA Assay (FDA approved for NASAL specimens only), is one component of a comprehensive MRSA colonization surveillance program. It is not intended to diagnose MRSA infection nor to guide or monitor treatment for MRSA infections. Performed at [facility], [address] [place], [place] [address]     Radiology Reports Dg Chest 1 View  Result Date: 04/09/2019 CLINICAL DATA:  Right hip pain after fall. EXAM: CHEST  1 VIEW COMPARISON:  Radiographs of March 24, 2018. FINDINGS: Stable cardiomegaly. Status post cardiac valve repair. Left-sided pacemaker is unchanged in position. Atherosclerosis of thoracic aorta is noted. Both lungs are clear. The visualized skeletal structures are unremarkable. IMPRESSION: No active disease. Aortic Atherosclerosis (ICD10-I70.0). Electronically Signed   By: Lupita RaiderJames  Green Jr M.D.   On: 04/09/2019 16:14   Ct Head Wo Contrast  Result Date: 04/10/2019 CLINICAL DATA:  73 y/o  F; fall with right hip pain. EXAM: CT HEAD WITHOUT CONTRAST TECHNIQUE: Contiguous axial images were obtained from the base of the skull through the vertex without intravenous contrast. COMPARISON:  08/03/2018 CT head. FINDINGS: Brain: No evidence of acute infarction, hemorrhage, extra-axial collection or mass lesion/mass effect. Stable chronic infarcts in right basal ganglia, left thalamus, and right cerebellum. Stable chronic hydrocephalus. Stable chronic microvascular ischemic changes of white matter and volume loss of the brain. Vascular: Calcific atherosclerosis of internal carotid arteries. No hyperdense vessel. Skull: Normal. Negative for fracture or focal lesion. Sinuses/Orbits: No acute finding. Other: None. IMPRESSION: 1. No acute intracranial abnormality identified. 2. Stable chronic microvascular ischemic changes and volume loss of the brain. 3. Stable chronic  infarcts in right basal ganglia, left thalamus, and right cerebellum. 4. Stable chronic hydrocephalus. Electronically Signed   By: Mitzi HansenLance  Furusawa-Stratton M.D.   On: 04/10/2019 22:13  Ct Hip Right Wo Contrast  Result Date: 04/09/2019 CLINICAL DATA:  Right hip pain status post fall EXAM: CT OF THE RIGHT HIP WITHOUT CONTRAST TECHNIQUE: Multidetector CT imaging of the right hip was performed according to the standard protocol. Multiplanar CT image reconstructions were also generated. COMPARISON:  None. FINDINGS: Bones/Joint/Cartilage No fracture or dislocation. Normal alignment. No joint effusion. Mild osteoarthritis of the right hip. Heterogeneous marrow of the proximal femoral diaphysis which may reflect red marrow or hemorrhage. Ligaments Ligaments are suboptimally evaluated by CT. Muscles and Tendons Muscles are normal.  No intramuscular fluid collection. Soft tissue No fluid collection or hematoma.  No soft tissue mass. IMPRESSION: No acute osseous injury of the right hip. Heterogeneous marrow of the proximal femoral diaphysis which may reflect red marrow or hemorrhage. Recommend further evaluation with MR of the femur. Electronically Signed   By: Elige KoHetal  Patel   On: 04/09/2019 17:10   Dg Chest Portable 1 View  Result Date: 04/10/2019 CLINICAL DATA:  Status post fall with right hip pain. EXAM: PORTABLE CHEST 1 VIEW COMPARISON:  April 09, 2019 FINDINGS: The heart size and mediastinal contours are stable. Cardiac valvular replacement ring and cardiac pacemaker are unchanged. The heart size is enlarged. The lungs are hyperinflated. There is no focal infiltrate, pulmonary edema, or pleural effusion. The visualized skeletal structures are unremarkable. IMPRESSION: No active cardiopulmonary disease. Electronically Signed   By: Sherian ReinWei-Chen  Lin M.D.   On: 04/10/2019 21:48   Dg Hip Port Unilat With Pelvis 1v Right  Result Date: 04/13/2019 CLINICAL DATA:  Postop right hip arthroplasty. EXAM: DG HIP (WITH OR  WITHOUT PELVIS) 1V PORT RIGHT COMPARISON:  04/10/2019 FINDINGS: Evidence of patient's recent interval unipolar right hip arthroplasty with prosthesis intact and normally positioned. Surgical drain over the right hip joint and adjacent skin staples over the right hip. Remainder of the exam is unchanged. IMPRESSION: Expected changes post right hip arthroplasty. Electronically Signed   By: Elberta Fortisaniel  Boyle M.D.   On: 04/13/2019 13:35   Dg Hip Unilat W Or Wo Pelvis 2-3 Views Right  Result Date: 04/10/2019 CLINICAL DATA:  Status post fall with right hip pain. EXAM: DG HIP (WITH OR WITHOUT PELVIS) 2-3V RIGHT COMPARISON:  April 09, 2019 FINDINGS: There is deformity of the proximal right femur in the femoral neck region suspicious for fracture. No other acute fracture dislocation is identified. IMPRESSION: There is deformity of the proximal right femur in the femoral neck region suspicious for fracture. Electronically Signed   By: Sherian ReinWei-Chen  Lin M.D.   On: 04/10/2019 21:47   Dg Hip Unilat W Or Wo Pelvis 2-3 Views Right  Result Date: 04/09/2019 CLINICAL DATA:  Right hip pain after fall. EXAM: DG HIP (WITH OR WITHOUT PELVIS) 2-3V RIGHT COMPARISON:  None. FINDINGS: There is no evidence of hip fracture or dislocation. There is no evidence of arthropathy or other focal bone abnormality. IMPRESSION: Negative. Electronically Signed   By: Lupita RaiderJames  Green Jr M.D.   On: 04/09/2019 16:12     CBC Recent Labs  Lab 04/10/19 2051 04/11/19 0253 04/13/19 0438  WBC 9.2 10.2 8.5  HGB 12.0 10.7* 10.2*  HCT 38.6 34.2* 33.0*  PLT 277 216 200  MCV 98.0 97.2 99.4  MCH 30.5 30.4 30.7  MCHC 31.1 31.3 30.9  RDW 12.6 12.7 12.2  LYMPHSABS 1.3  --   --   MONOABS 0.8  --   --   EOSABS 0.2  --   --   BASOSABS 0.1  --   --  Chemistries  Recent Labs  Lab 04/10/19 2051 04/11/19 0253 04/13/19 0438  NA 137 137 139  K 4.1 4.8 4.9  CL 104 105 103  CO2 GLUCOSE 98 108* 123*  BUN 20 22 32*  CREATININE 1.07* 1.38*  1.20*  CALCIUM 10.0 9.4 9.1  AST 19  --   --   ALT 12  --   --   ALKPHOS 99  --   --   BILITOT 1.0  --   --    ------------------------------------------------------------------------------------------------------------------ estimated creatinine clearance is 42.3 mL/min (A) (by C-G formula based on SCr of 1.2 mg/dL (H)). ------------------------------------------------------------------------------------------------------------------ No results for input(s): HGBA1C in the last 72 hours. ------------------------------------------------------------------------------------------------------------------ No results for input(s): CHOL, HDL, LDLCALC, TRIG, CHOLHDL, LDLDIRECT in the last 72 hours. ------------------------------------------------------------------------------------------------------------------ No results for input(s): TSH, T4TOTAL, T3FREE, THYROIDAB in the last 72 hours.  Invalid input(s): FREET3 ------------------------------------------------------------------------------------------------------------------ No results for input(s): VITAMINB12, FOLATE, FERRITIN, TIBC, IRON, RETICCTPCT in the last 72 hours.  Coagulation profile Recent Labs  Lab 04/10/19 2051 04/11/19 0253 04/12/19 0425 04/13/19 0438 04/14/19 0530  INR 2.4* 2.8* 1.4* 1.2 1.4*    No results for input(s): DDIMER in the last 72 hours.  Cardiac Enzymes No results for input(s): CKMB, TROPONINI, MYOGLOBIN in the last 168 hours.  Invalid input(s): CK ------------------------------------------------------------------------------------------------------------------ Invalid input(s): POCBNP    Assessment & Plan   1.  right hip fracture - s/p right hemiarthroplasty 04/13/19 -Pain management as per orthopedic team -We will give Lovenox subcu as now INR is subtherapeutic.  2.  Atrial fibrillation - Also with a history of mitral valve and aortic valve stenosis as well as pacemaker placement -  Held  Coumadin for surgery, resume postop   3.  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 - Telemetry monitoring   4.  CKD - Will monitor renal function closely. -Renal function creatinine has increased will continue IV fluids  5.  Peripheral artery disease     Code Status Orders  (From admission, onward)         Start     Ordered   04/10/19 2246  Full code  Continuous     04/10/19 2245        Code Status History    Date Active Date Inactive Code Status Order ID Comments User Context   09/21/2018 1355 09/21/2018 1827 Full Code [id number]  Duke Salvia, MD Inpatient   Advance Care Planning Activity       Consults orthopedics  DVT Prophylaxis SCDs  Lab Results  Component Value Date   PLT 200 04/13/2019     Time Spent in minutes 35 minutes Altamese Dilling M.D on 04/14/2019 at 1:34 PM  Between 7am to 6pm - Pager - [phone]  After 6pm go to [email or web address] - Social research officer, government  [employer]   Office  [phone]

## 2019-04-14 NOTE — Progress Notes (Signed)
Remote pacemaker transmission.   

## 2019-04-14 NOTE — Progress Notes (Signed)
  Subjective: 1 Day Post-Op Procedure(s) (LRB): ARTHROPLASTY BIPOLAR HIP (HEMIARTHROPLASTY) (Right) Patient reports pain as mild.   Patient seen in rounds with Dr. Rudene Christians. Patient is well, and has had no acute complaints or problems Plan is to go Rehab after hospital stay. Negative for chest pain and shortness of breath Fever: no Gastrointestinal: Negative for nausea and vomiting  Objective: Vital signs in last 24 hours: Temp:  [97.4 F (36.3 C)-99.3 F (37.4 C)] 97.8 F (36.6 C) (07/18 2341) Pulse Rate:  [57-72] 62 (07/19 0634) Resp:  [10-17] 14 (07/18 2341) BP: (118-163)/(44-67) 128/47 (07/18 2341) SpO2:  [96 %-100 %] 100 % (07/19 0634)  Intake/Output from previous day:  Intake/Output Summary (Last 24 hours) at 04/14/2019 0652 Last data filed at 04/14/2019 0500 Gross per 24 hour  Intake 2153.47 ml  Output 1700 ml  Net 453.47 ml    Intake/Output this shift: Total I/O In: 798.9 [I.V.:798.9] Out: 1350 [Urine:1100; Drains:250]  Labs: Recent Labs    04/13/19 0438  HGB 10.2*   Recent Labs    04/13/19 0438  WBC 8.5  RBC 3.32*  HCT 33.0*  PLT 200   Recent Labs    04/13/19 0438  NA 139  K 4.9  CL 103  CO2 29  BUN 32*  CREATININE 1.20*  GLUCOSE 123*  CALCIUM 9.1   Recent Labs    04/13/19 0438 04/14/19 0530  INR 1.2 1.4*     EXAM General - Patient is Alert and Oriented Extremity - Neurovascular intact Sensation intact distally Compartment soft Dressing/Incision - clean, dry, with the Hemovac intact Motor Function - intact, moving foot and toes well on exam.   Past Medical History:  Diagnosis Date  . Atrial fibrillation, permanent    a. CHA2DS2VASc = 4-->coumadin;  b. 09/2014 Echo: EF 55-60%, normal wall motion, mild AI/AS, nl MV, mod dil LA, mildly dil RA, mild to mod TR, PASP 92mmHg.  [name] Chronic kidney disease, stage I   . CVA (cerebral infarction)    a. 04/2015 - anticoagulation switched from eliquis to xarelto to coumadin.  . Leukocytosis   .  Pacemaker -Medtronic    a. implant for SSS  . PAD (peripheral artery disease) ([place])    a. w/ left lower ext claudication s/p ABI's 04/2015 showing nl ABI on Right with abnl waveforms on left sugg of L SFA dzs.  [name] Restless leg syndrome   . 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    Assessment/Plan: 1 Day Post-Op Procedure(s) (LRB): ARTHROPLASTY BIPOLAR HIP (HEMIARTHROPLASTY) (Right) Active Problems:   Closed right hip fracture, initial encounter ([place])  Estimated body mass index is 25.1 kg/m as calculated from the following:   Height as of this encounter: 5\' 9"  (1.753 m).   Weight as of this encounter: 77.1 kg. Advance diet Up with therapy D/C IV fluids Discharge to SNF when cleared by medicine  DVT Prophylaxis - Lovenox, Foot Pumps and TED hose Weight-Bearing as tolerated to right leg  Reche Dixon, PA-C Orthopaedic Surgery 04/14/2019, 6:52 AM

## 2019-04-15 ENCOUNTER — Encounter: Payer: Self-pay | Admitting: Orthopedic Surgery

## 2019-04-15 LAB — BASIC METABOLIC PANEL
Anion gap: 5 (ref 5–15)
BUN: 24 mg/dL — ABNORMAL HIGH (ref 8–23)
CO2: 26 mmol/L (ref 22–32)
Calcium: 9.1 mg/dL (ref 8.9–10.3)
Chloride: 105 mmol/L (ref 98–111)
Creatinine, Ser: 1.08 mg/dL — ABNORMAL HIGH (ref 0.44–1.00)
GFR calc Af Amer: 58 mL/min — ABNORMAL LOW (ref >60)
GFR calc non Af Amer: 50 mL/min — ABNORMAL LOW (ref >60)
Glucose, Bld: 142 mg/dL — ABNORMAL HIGH (ref 70–99)
Potassium: 4.5 mmol/L (ref 3.5–5.1)
Sodium: 136 mmol/L (ref 135–145)

## 2019-04-15 LAB — PROTIME-INR
INR: 1.6 — ABNORMAL HIGH (ref 0.8–1.2)
Prothrombin Time: 18.4 seconds — ABNORMAL HIGH (ref 11.4–15.2)

## 2019-04-15 LAB — CBC
HCT: 26.9 % — ABNORMAL LOW (ref 36.0–46.0)
Hemoglobin: 8.5 g/dL — ABNORMAL LOW (ref 12.0–15.0)
MCH: 30.6 pg (ref 26.0–34.0)
MCHC: 31.6 g/dL (ref 30.0–36.0)
MCV: 96.8 fL (ref 80.0–100.0)
Platelets: 238 10*3/uL (ref 150–400)
RBC: 2.78 MIL/uL — ABNORMAL LOW (ref 3.87–5.11)
RDW: 12.1 % (ref 11.5–15.5)
WBC: 10.1 10*3/uL (ref 4.0–10.5)
nRBC: 0 % (ref 0.0–0.2)

## 2019-04-15 LAB — URINALYSIS, COMPLETE (UACMP) WITH MICROSCOPIC
Bilirubin Urine: NEGATIVE
Glucose, UA: NEGATIVE mg/dL
Hgb urine dipstick: NEGATIVE
Ketones, ur: NEGATIVE mg/dL
Nitrite: NEGATIVE
Protein, ur: NEGATIVE mg/dL
Specific Gravity, Urine: 1.008 (ref 1.005–1.030)
pH: 5 (ref 5.0–8.0)

## 2019-04-15 MED ORDER — POLYETHYLENE GLYCOL 3350 17 G PO PACK
17.0000 g | PACK | Freq: Every day | ORAL | Status: DC
  Administered 2019-04-15: 17 g via ORAL
  Filled 2019-04-15: qty 1

## 2019-04-15 MED ORDER — DIAZEPAM 2 MG PO TABS: 2.0000 mg | ORAL_TABLET | Freq: Every evening | ORAL | 0 refills | Status: DC | PRN

## 2019-04-15 MED ORDER — FERROUS SULFATE 325 (65 FE) MG PO TABS: 325.0000 mg | ORAL_TABLET | Freq: Two times a day (BID) | ORAL | 3 refills | Status: DC

## 2019-04-15 MED ORDER — SENNOSIDES-DOCUSATE SODIUM 8.6-50 MG PO TABS
1.0000 | ORAL_TABLET | Freq: Two times a day (BID) | ORAL | Status: DC
  Administered 2019-04-15: 09:00:00 1 via ORAL
  Filled 2019-04-15: qty 1

## 2019-04-15 MED ORDER — POLYETHYLENE GLYCOL 3350 17 G PO PACK: 17.0000 g | PACK | Freq: Every day | ORAL | 0 refills | Status: DC

## 2019-04-15 MED ORDER — WARFARIN SODIUM 2.5 MG PO TABS
2.5000 mg | ORAL_TABLET | Freq: Once | ORAL | Status: DC
  Filled 2019-04-15: qty 1

## 2019-04-15 MED ORDER — OXYCODONE HCL 5 MG PO TABS: 5.0000 mg | ORAL_TABLET | Freq: Four times a day (QID) | ORAL | 0 refills | Status: DC | PRN

## 2019-04-15 MED ORDER — ENOXAPARIN SODIUM 40 MG/0.4ML ~~LOC~~ SOLN: 40.0000 mg | SUBCUTANEOUS | 0 refills | Status: DC

## 2019-04-15 MED ORDER — SENNOSIDES-DOCUSATE SODIUM 8.6-50 MG PO TABS: 1.0000 | ORAL_TABLET | Freq: Two times a day (BID) | ORAL | 0 refills | Status: DC

## 2019-04-15 MED ORDER — BISACODYL 10 MG RE SUPP
10.0000 mg | Freq: Once | RECTAL | Status: AC
  Administered 2019-04-15: 09:00:00 10 mg via RECTAL
  Filled 2019-04-15: qty 1

## 2019-04-15 MED ORDER — ACETAMINOPHEN 325 MG PO TABS: 325.0000 mg | ORAL_TABLET | Freq: Four times a day (QID) | ORAL | 0 refills | Status: DC | PRN

## 2019-04-15 NOTE — Progress Notes (Signed)
Report called and given to [place] at [place]. IVs removed. Patient in stable condition. EMS called and patient is 4th in line.

## 2019-04-15 NOTE — Evaluation (Signed)
Occupational Therapy Evaluation Patient Details Name: Rhonda Griffith MRN: [id number] DOB: 07-27-1946 Today's Date: 04/15/2019    History of Present Illness 73 yo female with onset of fall and resulting R intertrochanteric fracture was admitted for hemiarthroplasty with posterior precautions, WBAT.  PMHx:  Pacemaker, CVA, leukocytosis, RLS, CKD, PAD, rheumatic mitral and aortic valve stenosis, a-fib   Clinical Impression   Rhonda Griffith was seen for OT/PT co-evaluation/treatment on this date. Pt is POD#2  from above surgery. Pt was independent in all ADLs prior to surgery, using a 4WW for mobility. Pt is eager to return to PLOF with less pain and improved safety and independence. Pt currently requires moderate assist for LB dressing while in seated position due to pain and limited AROM of R hip. She requires +2 mod/max assist for STS and stand pivot transfers as well as max assist for peri-care after using the [facility] on this date. Pt unable to recall posterior hip precautions at start of session and unable to verbalize how to implement during ADL and mobility. Pt instructed in posterior hip precautions and how to implement, self care skills, falls prevention strategies, home/routines modifications, DME/AE for LB bathing and dressing tasks, & compression stocking mgt strategies. At end of session, pt able to recall 2/3 posterior hip precautions. Pt would benefit from additional instruction in self care skills and techniques to help maintain precautions with or without assistive devices to support recall and carryover prior to discharge. Recommend STR upon discharge.       Follow Up Recommendations  SNF    Equipment Recommendations  3 in 1 bedside commode    Recommendations for Other Services       Precautions / Restrictions Precautions Precautions: Fall;Posterior Hip Precaution Booklet Issued: No Precaution Comments: Per secure message with Dr. Marry Guan, pt to follow posterior hip  precautions. Restrictions Weight Bearing Restrictions: Yes RLE Weight Bearing: Weight bearing as tolerated      Mobility Bed Mobility Overal bed mobility: Needs Assistance Bed Mobility: Supine to Sit     Supine to sit: Mod assist        Transfers Overall transfer level: Needs assistance Equipment used: Rolling walker (2 wheeled);2 person hand held assist Transfers: Sit to/from Stand Sit to Stand: +2 physical assistance;Max assist;Mod assist         General transfer comment: Pt with improved activation of RLE. Required +2 assist for stand pivot transfer.    Balance Overall balance assessment: Needs assistance;History of Falls Sitting-balance support: Feet supported;Single extremity supported Sitting balance-Leahy Scale: Fair     Standing balance support: Bilateral upper extremity supported;During functional activity Standing balance-Leahy Scale: Poor Standing balance comment: Pt only able to maintain balance with assistance from therapists.                           ADL either performed or assessed with clinical judgement   ADL Overall ADL's : Needs assistance/impaired Eating/Feeding: Set up;Sitting   Grooming: Set up;Sitting;Cueing for sequencing   Upper Body Bathing: Sitting;Min guard;Set up   Lower Body Bathing: Set up;Moderate assistance;Sitting/lateral leans;Adhering to hip precautions;Cueing for safety   Upper Body Dressing : Set up;Sitting;Supervision/safety;Cueing for sequencing   Lower Body Dressing: Set up;Cueing for safety;Sit to/from stand;+2 for physical assistance;+2 for safety/equipment;Adhering to hip precautions;With adaptive equipment;Moderate assistance;Maximal assistance   Toilet Transfer: +2 for safety/equipment;+2 for physical assistance;Stand-pivot;Maximal assistance;BSC Toilet Transfer Details (indicate cue type and reason): +2 HH assist to stand pivot to [facility] on this  date. Atempted trial with RW, and pt unable to advance RW.  [facility], pt tolerated well. Toileting- Clothing Manipulation and Hygiene: +2 for physical assistance;+2 for safety/equipment;Adhering to hip precautions;Maximal assistance Toileting - Clothing Manipulation Details (indicate cue type and reason): Pt required max assist for peri care on this date.             Vision Baseline Vision/History: Wears glasses Wears Glasses: Reading only Patient Visual Report: Blurring of vision(Pt states some blurry vision, but also had this at baseline.) Vision Assessment?: Vision impaired- to be further tested in functional context     Perception     Praxis      Pertinent Vitals/Pain Pain Assessment: 0-10 Pain Score: 5  Pain Location: R hip with mobility. Pain Descriptors / Indicators: Operative site guarding;Grimacing;Sore Pain Intervention(s): Limited activity within patient's tolerance;Monitored during session;Repositioned     Hand Dominance Right   Extremity/Trunk Assessment Upper Extremity Assessment Upper Extremity Assessment: Generalized weakness   Lower Extremity Assessment Lower Extremity Assessment: RLE deficits/detail;Defer to PT evaluation RLE Deficits / Details: R hemiarthroplasty with precautions RLE: Unable to fully assess due to pain RLE Coordination: decreased fine motor;decreased gross motor   Cervical / Trunk Assessment Cervical / Trunk Assessment: Kyphotic   Communication Communication Communication: No difficulties   Cognition Arousal/Alertness: Awake/alert Behavior During Therapy: Flat affect Overall Cognitive Status: Within Functional Limits for tasks assessed                                 General Comments: Pt appears to be improving cognitively. States she, "was pretty confused yesterday". Is able to follow 1 step VCs with increased time to process at times.   General Comments       Exercises Other Exercises Other Exercises: Pt educated on falls prevention  strategies, adherence to posterior hip precautions x3, safe use AE for LB ADL and precaution adherence, and compression stocking mgt. Would benefit from reinforcement of all education provided as well as trial with AE for LB ADL. Other Exercises: [facility]. Required +2 HH assist with mod/max assist.   Shoulder Instructions      Home Living Family/patient expects to be discharged to:: Private residence Living Arrangements: Children Available Help at Discharge: Family;Available 24 hours/day Type of Home: House Home Access: Stairs to enter [employer] CorporationEntrance Stairs-Number of Steps: 5 Entrance Stairs-Rails: Right;Left;Can reach both Home Layout: One level         FirefighterBathroom Toilet: Standard     Home Equipment: Walker - 4 wheels          Prior Functioning/Environment Level of Independence: Independent with assistive device(s)        Comments: history of falls, used Rollator        OT Problem List: Decreased strength;Decreased coordination;Decreased range of motion;Decreased activity tolerance;Decreased safety awareness;Impaired balance (sitting and/or standing);Decreased knowledge of use of DME or AE;Decreased knowledge of precautions;Pain      OT Treatment/Interventions: Self-care/ADL training;Balance training;Therapeutic exercise;Therapeutic activities;DME and/or AE instruction;Patient/family education    OT Goals(Current goals can be found in the care plan section) Acute Rehab OT Goals Patient Stated Goal: to go home and continue to feel better OT Goal Formulation: With patient Time For Goal Achievement: 04/29/19 Potential to Achieve Goals: Good ADL Goals Pt Will Perform Lower Body Bathing: with adaptive equipment;sit to/from stand;with mod assist;with min assist(With LRAD PRN for improved safety and functional independence.) Pt  Will Perform Lower Body Dressing: with adaptive equipment;sit to/from stand;with min assist(With LRAD PRN for improved safety and  functional independence.) Pt Will Transfer to Toilet: with min assist;bedside commode;stand pivot transfer(With LRAD/DME PRN for improved safety and functional independence.) Additional ADL Goal #1: Pt will demonstrate understanding and adherence to posterior hip precatuions during ADL tasks with min cueing from a caregiver for safety and technique upon hospital DC.  OT Frequency: Min 2X/week   Barriers to D/C: Inaccessible home environment          Co-evaluation PT/OT/SLP Co-Evaluation/Treatment: Yes Reason for Co-Treatment: For patient/therapist safety;To address functional/ADL transfers PT goals addressed during session: Mobility/safety with mobility;Balance OT goals addressed during session: ADL's and self-care;Proper use of Adaptive equipment and DME      AM-PAC OT "6 Clicks" Daily Activity     Outcome Measure Help from another person eating meals?: None Help from another person taking care of personal grooming?: A Little Help from another person toileting, which includes using toliet, bedpan, or urinal?: Total Help from another person bathing (including washing, rinsing, drying)?: A Lot Help from another person to put on and taking off regular upper body clothing?: A Little Help from another person to put on and taking off regular lower body clothing?: A Lot 6 Click Score: 15   End of Session Equipment Utilized During Treatment: Gait belt;Rolling walker  Activity Tolerance: Patient tolerated treatment well Patient left: in chair;with call bell/phone within reach;with chair alarm set;with SCD's reapplied  OT Visit Diagnosis: Other abnormalities of gait and mobility (R26.89);History of falling (Z91.81);Pain Pain - Right/Left: Right Pain - part of body: Hip                Time: [id number] OT Time Calculation (min): 39 min Charges:  OT General Charges $OT Visit: 1 Visit OT Evaluation $OT Eval Moderate Complexity: 1 Mod OT Treatments $Self Care/Home Management : 8-22  mins  Rockney GheeSerenity Isack Lavalley, M.S., OTR/L Ascom: [phone] 04/15/19, 11:29 AM

## 2019-04-15 NOTE — Discharge Instructions (Signed)
We held coumadin for surgery and resumed after surgery. She is on Lovenox [place] [facility] until INR is > 2. Please check INR in next 2-3 days. It was 1.6 on day of discharge.

## 2019-04-15 NOTE — Plan of Care (Signed)
  Problem: Education: Goal: Knowledge of General Education information will improve Description: Including pain rating scale, medication(s)/side effects and non-pharmacologic comfort measures Outcome: Progressing   Problem: Pain Managment: Goal: General experience of comfort will improve Outcome: Progressing   Problem: Safety: Goal: Ability to remain free from injury will improve Outcome: Progressing   Problem: Education: Goal: Verbalization of understanding the information provided (i.e., activity precautions, restrictions, etc) will improve Outcome: Progressing Goal: Individualized Educational Video(s) Outcome: Progressing   Problem: Activity: Goal: Ability to ambulate and perform ADLs will improve Outcome: Progressing   Problem: Clinical Measurements: Goal: Postoperative complications will be avoided or minimized Outcome: Progressing   Problem: Self-Concept: Goal: Ability to maintain and perform role responsibilities to the fullest extent possible will improve Outcome: Progressing   Problem: Pain Management: Goal: Pain level will decrease Outcome: Progressing

## 2019-04-15 NOTE — Discharge Summary (Signed)
[facility] NAME: Rhonda Griffith    MR#:  [id number]  DATE OF BIRTH:  [birth year]  DATE OF ADMISSION:  04/10/2019 ADMITTING PHYSICIAN: Christel Mormon, MD  DATE OF DISCHARGE: 04/15/2019   PRIMARY CARE PHYSICIAN: Cecile Sheerer, MD    ADMISSION DIAGNOSIS:  Fall, initial encounter [phone].[id number]] Closed fracture of right hip, initial encounter ([place]) [S72.001A]  DISCHARGE DIAGNOSIS:  Active Problems:   Closed right hip fracture, initial encounter ([place])   SECONDARY DIAGNOSIS:   Past Medical History:  Diagnosis Date  . Atrial fibrillation, permanent    a. CHA2DS2VASc = 4-->coumadin;  b. 09/2014 Echo: EF 55-60%, normal wall motion, mild AI/AS, nl MV, mod dil LA, mildly dil RA, mild to mod TR, PASP 33mmHg.  [name] Chronic kidney disease, stage I   . CVA (cerebral infarction)    a. 04/2015 - anticoagulation switched from eliquis to xarelto to coumadin.  . Leukocytosis   . Pacemaker -Medtronic    a. implant for SSS  . PAD (peripheral artery disease) ([place])    a. w/ left lower ext claudication s/p ABI's 04/2015 showing nl ABI on Right with abnl waveforms on left sugg of L SFA dzs.  [name] Restless leg syndrome   . 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    HOSPITAL COURSE:   1.right hip fracture -s/p right hemiarthroplasty 04/13/19 -Pain management as per orthopedic team -We will give Lovenox subcu as now INR is subtherapeutic. Check INR in next 2-3 days.  2. Atrial fibrillation -Also with a history of mitral valve and aortic valve stenosis as well as pacemaker placement - Held Coumadin for surgery, resume postop- follow INR daily.   3. History of CVA -With residual lower extremity weakness since 1991 - PT evaluation after surgery- suggest SNF placemeent. -Telemetry monitoring   4. CKD -Will monitor renal function closely. -Renal  function creatinine has increased will continue IV fluids  5.  Anemia of blood loss Due to surgery, oral iron therapy.  DISCHARGE CONDITIONS:   Stable.  CONSULTS OBTAINED:  Treatment Team:  Dereck Leep, MD  DRUG ALLERGIES:   Allergies  Allergen Reactions  . Morphine Nausea And Vomiting  . Other Other (See Comments)    Several different statins.  . Statins Other (See Comments)    Several different statins.  . Tizanidine Palpitations  . Tape Other (See Comments)    Breaks her skin Allergic to plastic/latex tape. Only use paper tape on patient.  . Tapentadol Other (See Comments)  . Cephalexin Rash  . Latex Rash  . Sulfa Antibiotics Rash and Other (See Comments)    DISCHARGE MEDICATIONS:   Allergies as of 04/15/2019      Reactions   Morphine Nausea And Vomiting   Other Other (See Comments)   Several different statins.   Statins Other (See Comments)   Several different statins.   Tizanidine Palpitations   Tape Other (See Comments)   Breaks her skin Allergic to plastic/latex tape. Only use paper tape on patient.   Tapentadol Other (See Comments)   Cephalexin Rash   Latex Rash   Sulfa Antibiotics Rash, Other (See Comments)      Medication List    STOP taking these medications   oxyCODONE-acetaminophen 5-325 MG tablet Commonly known as: Percocet     TAKE these medications   acetaminophen 325 MG tablet Commonly known as: TYLENOL Take 1-2  tablets (325-650 mg total) by mouth every 6 (six) hours as needed for mild pain (pain score 1-3 or temp > 100.5).   albuterol 1.25 MG/3ML nebulizer solution Commonly known as: ACCUNEB Take 1 ampule by nebulization every 6 (six) hours as needed for wheezing.   albuterol 108 (90 Base) MCG/ACT inhaler Commonly known as: VENTOLIN HFA Inhale 2 puffs into the lungs every 6 (six) hours as needed for wheezing.   ascorbic acid 500 MG tablet Commonly known as: VITAMIN C Take 500 mg by mouth daily.   atorvastatin 80 MG  tablet Commonly known as: LIPITOR Take 80 mg by mouth every evening.   cyclobenzaprine 5 MG tablet Commonly known as: FLEXERIL Take 5 mg by mouth 2 (two) times daily as needed for muscle spasms.   diazepam 2 MG tablet Commonly known as: VALIUM Take 1 tablet (2 mg total) by mouth at bedtime as needed for anxiety. What changed:  medication strength how much to take when to take this   diclofenac sodium 1 % Gel Commonly known as: VOLTAREN Apply 2-4 g topically 2 (two) times daily as needed (for knee pain.).   diltiazem 120 MG 24 hr capsule Commonly known as: CARDIZEM CD Take 1 capsule (120 mg total) by mouth daily.   Dulcolax 5 MG EC tablet Generic drug: bisacodyl Take 1 tablet (5 mg total) by mouth daily as needed (if taking the narcotic pain medication).   DULoxetine 60 MG capsule Commonly known as: CYMBALTA Take 60 mg by mouth daily.   enoxaparin 40 MG/0.4ML injection Commonly known as: LOVENOX Inject 0.4 mLs (40 mg total) into the skin daily for 2 days.   ferrous sulfate 325 (65 FE) MG tablet Take 1 tablet (325 mg total) by mouth 2 (two) times daily with a meal.   fluticasone 50 MCG/ACT nasal spray Commonly known as: FLONASE Place 2 sprays into both nostrils daily.   furosemide 20 MG tablet Commonly known as: LASIX Take 20 mg by mouth daily as needed (fluid retention/swelling.).   gabapentin 100 MG capsule Commonly known as: NEURONTIN Take 100 mg by mouth as directed. Take one capsule (100mg ) twice a day, may gradually increase to three times a day if needed.   magnesium oxide 400 MG tablet Commonly known as: MAG-OX Take 400 mg by mouth 2 (two) times daily.   oxyCODONE 5 MG immediate release tablet Commonly known as: Oxy IR/ROXICODONE Take 1 tablet (5 mg total) by mouth every 6 (six) hours as needed for moderate pain or severe pain (pain score 4-6).   pantoprazole 40 MG tablet Commonly known as: PROTONIX Take 40 mg by mouth daily.   polyethylene glycol  17 g packet Commonly known as: MIRALAX / GLYCOLAX Take 17 g by mouth daily. Start taking on: April 16, 2019   potassium chloride 10 MEQ tablet Commonly known as: K-DUR Take 1 tablet (10 mEq total) by mouth daily as needed. Take with lasix What changed:  reasons to take this additional instructions   rOPINIRole 2 MG tablet Commonly known as: REQUIP Take 2 mg by mouth at bedtime.   senna-docusate 8.6-50 MG tablet Commonly known as: Senokot-S Take 1 tablet by mouth 2 (two) times daily.   solifenacin 10 MG tablet Commonly known as: VESICARE Take 10 mg by mouth daily.   Vitamin D3 25 MCG (1000 UT) Caps Take 2,000 Units by mouth every 30 (thirty) days.   warfarin 5 MG tablet Commonly known as: COUMADIN Take 2.5-5 mg by mouth as directed. Take 2.5mg  on  all days EXCEPT Tuesday. On Tuesday take 5mg .        DISCHARGE INSTRUCTIONS:    Follow INR In next 2-3 days- Once > 2- we can stop Lovenox Inj and follow with Ortho clinic in 1-2 weeks.  If you experience worsening of your admission symptoms, develop shortness of breath, life threatening emergency, suicidal or homicidal thoughts you must seek medical attention immediately by calling 911 or calling your MD immediately  if symptoms less severe.  You Must read complete instructions/literature along with all the possible adverse reactions/side effects for all the Medicines you take and that have been prescribed to you. Take any new Medicines after you have completely understood and accept all the possible adverse reactions/side effects.   Please note  You were cared for by a hospitalist during your hospital stay. If you have any questions about your discharge medications or the care you received while you were in the hospital after you are discharged, you can call the unit and asked to speak with the hospitalist on call if the hospitalist that took care of you is not available. Once you are discharged, your primary care physician will  handle any further medical issues. Please note that NO REFILLS for any discharge medications will be authorized once you are discharged, as it is imperative that you return to your primary care physician (or establish a relationship with a primary care physician if you do not have one) for your aftercare needs so that they can reassess your need for medications and monitor your lab values.    Today   CHIEF COMPLAINT:   Chief Complaint  Patient presents with  . Hip Pain    Right   . Fall    HISTORY OF PRESENT ILLNESS:  Rhonda LauthVirginia Griffith  is a 73 y.o. female with a known history of COPD, peripheral artery disease, RLS, history of CVA in 1991, atrial fibrillation, mitral valve stenosis, aortic valve stenosis, CKD stage I.  Patient presented to the emergency room after a fall at home complaining of pain in her right hip reporting severe pain and inability to move her right hip.  She reports increased weakness over the last 2 to 3 days with a similar fall on yesterday which resulted in being seen in the emergency room as well.  She has a history of CVA with resulting weakness in her lower extremities.  Patient uses a walker for ambulatory assistance.  She denies recent illness.  She denies chest pain, cough, fever, chills, nausea, vomiting.  Patient reports she has been more short of breath over the last 2 days.  Chest x-ray demonstrates no acute pulmonary disease.  Right hip x-ray shows right femoral neck fracture.  Dr. Ernest PineHooten with orthopedic surgery was consulted by the ED physician planning surgical intervention when patient is medically stable and INR is less than 1.3.  Patient is currently on Coumadin for history of CVA and atrial fibrillation as well as valvular stenosis.  We have admitted her to the hospitalist service for further management.   VITAL SIGNS:  Blood pressure (!) 137/49, pulse 71, temperature 98.8 F (37.1 C), temperature source Oral, resp. rate 19, height 5\' 9"  (1.753 m),  weight 77.1 kg, SpO2 96 %.  I/O:    Intake/Output Summary (Last 24 hours) at 04/15/2019 1424 Last data filed at 04/15/2019 1236 Gross per 24 hour  Intake 360 ml  Output 2235 ml  Net -1875 ml    PHYSICAL EXAMINATION:   GENERAL: Pleasant-appearing in no apparent  distress.  HEAD, EYES, EARS, NOSE AND THROAT: Atraumatic, normocephalic. Extraocular muscles are intact. Pupils equal and reactive to light. Sclerae anicteric. No conjunctival injection. No oro-pharyngeal erythema.  NECK: Supple. There is no jugular venous distention. No bruits, no lymphadenopathy, no thyromegaly.  HEART: Regular rate and rhythm,. No murmurs, no rubs, no clicks.  LUNGS: Clear to auscultation bilaterally. No rales or rhonchi. No wheezes.  ABDOMEN: Soft, flat, nontender, nondistended. Has good bowel sounds. No hepatosplenomegaly appreciated.  EXTREMITIES: No evidence of any cyanosis, clubbing, or peripheral edema.  +2 pedal and radial pulses bilaterally.  NEUROLOGIC: The patient is alert, awake, and oriented x3 with no focal motor or sensory deficits appreciated bilaterally.  SKIN: Moist and warm with no rashes appreciated.  Psych: Not anxious, depressed LN: No inguinal LN enlargement  DATA REVIEW:   CBC Recent Labs  Lab 04/15/19 0352  WBC 10.1  HGB 8.5*  HCT 26.9*  PLT 238    Chemistries  Recent Labs  Lab 04/10/19 2051  04/15/19 0352  NA 137   < > 136  K 4.1   < > 4.5  CL 104   < > 105  CO2 26   < > 26  GLUCOSE 98   < > 142*  BUN 20   < > 24*  CREATININE 1.07*   < > 1.08*  CALCIUM 10.0   < > 9.1  AST 19  --   --   ALT 12  --   --   ALKPHOS 99  --   --   BILITOT 1.0  --   --    < > = values in this interval not displayed.    Cardiac Enzymes No results for input(s): TROPONINI in the last 168 hours.  Microbiology Results  Results for orders placed or performed during the hospital encounter of 04/10/19  SARS Coronavirus 2 (CEPHEID- Performed in [facility] hospital lab), Hosp Order      Status: None   Collection Time: 04/10/19 11:19 PM   Specimen: Nasopharyngeal Swab  Result Value Ref Range Status   SARS Coronavirus 2 NEGATIVE NEGATIVE Final    Comment: (NOTE) If result is NEGATIVE SARS-CoV-2 target nucleic acids are NOT DETECTED. The SARS-CoV-2 RNA is generally detectable in upper and lower  respiratory specimens during the acute phase of infection. The lowest  concentration of SARS-CoV-2 viral copies this assay can detect is 250  copies / mL. A negative result does not preclude SARS-CoV-2 infection  and should not be used as the sole basis for treatment or other  patient management decisions.  A negative result may occur with  improper specimen collection / handling, submission of specimen other  than nasopharyngeal swab, presence of viral mutation(s) within the  areas targeted by this assay, and inadequate number of viral copies  (<250 copies / mL). A negative result must be combined with clinical  observations, patient history, and epidemiological information. If result is POSITIVE SARS-CoV-2 target nucleic acids are DETECTED. The SARS-CoV-2 RNA is generally detectable in upper and lower  respiratory specimens dur ing the acute phase of infection.  Positive  results are indicative of active infection with SARS-CoV-2.  Clinical  correlation with patient history and other diagnostic information is  necessary to determine patient infection status.  Positive results do  not rule out bacterial infection or co-infection with other viruses. If result is PRESUMPTIVE POSTIVE SARS-CoV-2 nucleic acids MAY BE PRESENT.   A presumptive positive result was obtained on the submitted specimen  and  confirmed on repeat testing.  While 2019 novel coronavirus  (SARS-CoV-2) nucleic acids may be present in the submitted sample  additional confirmatory testing may be necessary for epidemiological  and / or clinical management purposes  to differentiate between  SARS-CoV-2 and other  Sarbecovirus currently known to infect humans.  If clinically indicated additional testing with an alternate test  methodology [id number]) is advised. The SARS-CoV-2 RNA is generally  detectable in upper and lower respiratory sp ecimens during the acute  phase of infection. The expected result is Negative. Fact Sheet for Patients:  BoilerBrush.com.cyhttps://www.fda.gov/media/136312/download Fact Sheet for Healthcare Providers: https://pope.com/https://www.fda.gov/media/136313/download This test is not yet approved or cleared by the [place] FDA and has been authorized for detection and/or diagnosis of SARS-CoV-2 by FDA under an Emergency Use Authorization (EUA).  This EUA will remain in effect (meaning this test can be used) for the duration of the COVID-19 declaration under Section 564(b)(1) of the Act, 21 U.S.C. section 360bbb-3(b)(1), unless the authorization is terminated or revoked sooner. Performed at [facility], [address] [place], [place] [address]   MRSA PCR Screening     Status: None   Collection Time: 04/11/19  2:35 AM   Specimen: Nasal Mucosa; Nasopharyngeal  Result Value Ref Range Status   MRSA by PCR NEGATIVE NEGATIVE Final    Comment:        The GeneXpert MRSA Assay (FDA approved for NASAL specimens only), is one component of a comprehensive MRSA colonization surveillance program. It is not intended to diagnose MRSA infection nor to guide or monitor treatment for MRSA infections. Performed at [facility], [address] [place], [place] [address]     RADIOLOGY:  No results found.  EKG:   Orders placed or performed during the hospital encounter of 04/10/19  . ED EKG  . ED EKG  . EKG 12-Lead  . EKG 12-Lead      Management plans discussed with the patient, family and they are in agreement.  CODE STATUS:     Code Status Orders  (From admission, onward)         Start     Ordered   04/11/19 1401  Do not attempt resuscitation (DNR)  Continuous     Question Answer Comment  In the event of cardiac or respiratory ARREST Do not call a "code blue"   In the event of cardiac or respiratory ARREST Do not perform Intubation, CPR, defibrillation or ACLS   In the event of cardiac or respiratory ARREST Use medication by any route, position, wound care, and other measures to relive pain and suffering. May use oxygen, suction and manual treatment of airway obstruction as needed for comfort.      04/11/19 1401        Code Status History    Date Active Date Inactive Code Status Order ID Comments User Context   04/10/2019 2245 04/11/2019 1401 Full Code [id number]  Pearletha AlfredSeals, Angela H, NP ED   09/21/2018 1355 09/21/2018 1827 Full Code [id number]  Duke SalviaKlein, Steven C, MD Inpatient   Advance Care Planning Activity      TOTAL TIME TAKING CARE OF THIS PATIENT: 35 minutes.    Altamese DillingVaibhavkumar Ladarrious Kirksey M.D on 04/15/2019 at 2:24 PM  Between 7am to 6pm - Pager - [phone]  After 6pm go to [email or web address] - Social research officer, governmentpassword [email or web address]  [facility]  Office  [phone]  CC: Primary care physician; Shapely-Quinn, Desiree Lucyodd [place], MD   Note: This dictation was prepared with Dragon dictation along with  smaller phrase technology. Any transcriptional errors that result from this process are unintentional.

## 2019-04-15 NOTE — Progress Notes (Signed)
Physical Therapy Treatment Patient Details Name: Rhonda Griffith MRN: [id number] DOB: 01/09/1946 Today's Date: 04/15/2019    History of Present Illness 73 yo female with onset of fall and resulting R intertrochanteric fracture was admitted for hemiarthroplasty with posterior precautions, WBAT.  PMHx:  Pacemaker, CVA, leukocytosis, RLS, CKD, PAD, rheumatic mitral and aortic valve stenosis, a-fib    PT Comments    Pt up in recliner and ready to return to bed.  Max a x 2 transfer back to bed and positioned for comfort.  Generally fatigued with effort but was able to tolerate 2 1/2 hours up in chair today.   Follow Up Recommendations  SNF     Equipment Recommendations  None recommended by PT    Recommendations for Other Services       Precautions / Restrictions Precautions Precautions: Fall;Posterior Hip Precaution Booklet Issued: No Precaution Comments: Per secure message with Dr. Ernest PineHooten, pt to follow posterior hip precautions. Restrictions Weight Bearing Restrictions: Yes RLE Weight Bearing: Weight bearing as tolerated Other Position/Activity Restrictions: posterior hip precautions    Mobility  Bed Mobility Overal bed mobility: Needs Assistance Bed Mobility: Sit to Supine     Supine to sit: Mod assist Sit to supine: Max assist;+2 for physical assistance      Transfers Overall transfer level: Needs assistance Equipment used: None Transfers: Stand Pivot Transfers Sit to Stand: Max assist;+2 physical assistance;From elevated surface Stand pivot transfers: Max assist;+2 physical assistance;From elevated surface       General transfer comment: uanble to step with walker and max a x 2  Ambulation/Gait                 Stairs             Wheelchair Mobility    Modified Rankin (Stroke Patients Only)       Balance Overall balance assessment: Needs assistance;History of Falls Sitting-balance support: Feet supported;Single extremity  supported Sitting balance-Leahy Scale: Fair     Standing balance support: Bilateral upper extremity supported Standing balance-Leahy Scale: Zero Standing balance comment: Pt only able to maintain balance with assistance from therapists.                            Cognition Arousal/Alertness: Awake/alert Behavior During Therapy: WFL for tasks assessed/performed Overall Cognitive Status: Within Functional Limits for tasks assessed                                 General Comments: Pt appears to be improving cognitively. States she, "was pretty confused yesterday". Is able to follow 1 step VCs with increased time to process at times.      Exercises Other Exercises Other Exercises: to commode to attempt BM Other Exercises: [facility] to [facility] this date. Required +2 HH assist with mod/max assist.    General Comments        Pertinent Vitals/Pain Pain Assessment: Faces Pain Score: 5  Faces Pain Scale: Hurts little more Pain Location: R hip with mobility. Pain Descriptors / Indicators: Operative site guarding;Grimacing;Sore Pain Intervention(s): Limited activity within patient's tolerance;Monitored during session    Home Living Family/patient expects to be discharged to:: Private residence Living Arrangements: Children Available Help at Discharge: Family;Available 24 hours/day Type of Home: House Home Access: Stairs to enter Entrance Stairs-Rails: Right;Left;Can reach both Home Layout: One level Home Equipment: Environmental consultantWalker - 4 wheels  Prior Function Level of Independence: Independent with assistive device(s)      Comments: history of falls, used Rollator   PT Goals (current goals can now be found in the care plan section) Acute Rehab PT Goals Patient Stated Goal: to go home and continue to feel better Progress towards PT goals: Progressing toward goals    Frequency    BID      PT Plan Current plan remains appropriate    Co-evaluation  PT/OT/SLP Co-Evaluation/Treatment: Yes Reason for Co-Treatment: For patient/therapist safety;To address functional/ADL transfers PT goals addressed during session: Mobility/safety with mobility;Balance OT goals addressed during session: ADL's and self-care;Proper use of Adaptive equipment and DME      AM-PAC PT "6 Clicks" Mobility   Outcome Measure  Help needed turning from your back to your side while in a flat bed without using bedrails?: A Lot Help needed moving from lying on your back to sitting on the side of a flat bed without using bedrails?: A Lot Help needed moving to and from a bed to a chair (including a wheelchair)?: Total Help needed standing up from a chair using your arms (e.g., wheelchair or bedside chair)?: Total Help needed to walk in hospital room?: Total Help needed climbing 3-5 steps with a railing? : Total 6 Click Score: 8    End of Session Equipment Utilized During Treatment: Gait belt Activity Tolerance: Patient limited by fatigue;Patient limited by pain Patient left: in bed;with call bell/phone within reach;with bed alarm set   Pain - Right/Left: Right Pain - part of body: Hip     Time: 1300-1310 PT Time Calculation (min) (ACUTE ONLY): 10 min  Charges:  $Therapeutic Activity: 8-22 mins                    Chesley Noon, PTA 04/15/19, 1:54 PM

## 2019-04-15 NOTE — Progress Notes (Signed)
   Subjective: 2 Days Post-Op Procedure(s) (LRB): ARTHROPLASTY BIPOLAR HIP (HEMIARTHROPLASTY) (Right) Patient reports pain as mild.   Patient is well, and has had no acute complaints or problems Slow progression with rehab Plan is to go Rehab after hospital stay. no nausea and no vomiting Patient denies any chest pains or shortness of breath. Patient rested well during the night. Voicing no complaints  Objective: Vital signs in last 24 hours: Temp:  [98.1 F (36.7 C)-99 F (37.2 C)] 99 F (37.2 C) (07/19 2318) Pulse Rate:  [47-73] 59 (07/19 2318) Resp:  [15-19] 19 (07/19 2318) BP: (127-147)/(46-54) 141/47 (07/19 2318) SpO2:  [97 %-100 %] 99 % (07/19 2318) well approximated incision Heels are non tender and elevated off the bed using rolled towels Intake/Output from previous day: 07/19 0701 - 07/20 0700 In: 240 [P.O.:240] Out: 2660 [Urine:2575; Drains:85] Intake/Output this shift: No intake/output data recorded.  Recent Labs    04/13/19 0438 04/15/19 0352  HGB 10.2* 8.5*   Recent Labs    04/13/19 0438 04/15/19 0352  WBC 8.5 10.1  RBC 3.32* 2.78*  HCT 33.0* 26.9*  PLT 200 238   Recent Labs    04/13/19 0438 04/15/19 0352  NA 139 136  K 4.9 4.5  CL 103 105  CO2 29 26  BUN 32* 24*  CREATININE 1.20* 1.08*  GLUCOSE 123* 142*  CALCIUM 9.1 9.1   Recent Labs    04/14/19 0530 04/15/19 0352  INR 1.4* 1.6*    EXAM General - Patient is Alert, Appropriate and Oriented Extremity - Neurologically intact Neurovascular intact Sensation intact distally Intact pulses distally Dorsiflexion/Plantar flexion intact No cellulitis present Compartment soft Dressing - scant drainage Motor Function - intact, moving foot and toes well on exam.    Past Medical History:  Diagnosis Date  . Atrial fibrillation, permanent    a. CHA2DS2VASc = 4-->coumadin;  b. 09/2014 Echo: EF 55-60%, normal wall motion, mild AI/AS, nl MV, mod dil LA, mildly dil RA, mild to mod TR, PASP  46mmHg.  [name] Chronic kidney disease, stage I   . CVA (cerebral infarction)    a. 04/2015 - anticoagulation switched from eliquis to xarelto to coumadin.  . Leukocytosis   . Pacemaker -Medtronic    a. implant for SSS  . PAD (peripheral artery disease) ([place])    a. w/ left lower ext claudication s/p ABI's 04/2015 showing nl ABI on Right with abnl waveforms on left sugg of L SFA dzs.  [name] Restless leg syndrome   . 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    Assessment/Plan: 2 Days Post-Op Procedure(s) (LRB): ARTHROPLASTY BIPOLAR HIP (HEMIARTHROPLASTY) (Right) Active Problems:   Closed right hip fracture, initial encounter ([place])  Estimated body mass index is 25.1 kg/m as calculated from the following:   Height as of this encounter: 5\' 9"  (1.753 m).   Weight as of this encounter: 77.1 kg. Up with therapy Discharge to SNF  Labs: Hemoglobin 8.5 with INR 1.6 DVT Prophylaxis - Lovenox, Coumadin, TED hose and SCDs Weight-Bearing as tolerated to right leg Patient needs bowel movement Patient will need to follow-up in clinical clinic in 2 weeks Will discontinue Lovenox once INR is therapeutic at 2.0 Hemovac was discontinued today.  Into the drain appeared to be intact  Rhonda Griffith R. [place] 04/15/2019, 7:13 AM

## 2019-04-15 NOTE — Progress Notes (Signed)
Physical Therapy Treatment Patient Details Name: Dalisha Shively MRN: [id number] DOB: 1946-06-24 Today's Date: 04/15/2019    History of Present Illness 73 yo female with onset of fall and resulting R intertrochanteric fracture was admitted for hemiarthroplasty with posterior precautions, WBAT.  PMHx:  Pacemaker, CVA, leukocytosis, RLS, CKD, PAD, rheumatic mitral and aortic valve stenosis, a-fib    PT Comments    Co-tx with OT during eval 2 units billed.  To edge of bed with mod a x 1 and good effort.  Sitting EOB with supervision.  Stood to walker with max a x 2 and was unable to gain her balance leaning backwards on bed.  Pt felt she was able to step despite positioning and assist but was unable and assisted back to bed.  Stand pivot with max a x 2 to commode.  Very small BM noted and lots of gas.  Stood for care and transferred to recliner.     Follow Up Recommendations  SNF     Equipment Recommendations  None recommended by PT    Recommendations for Other Services       Precautions / Restrictions Precautions Precautions: Fall;Posterior Hip Precaution Booklet Issued: No Precaution Comments: Per secure message with Dr. Marry Guan, pt to follow posterior hip precautions. Restrictions Weight Bearing Restrictions: Yes RLE Weight Bearing: Weight bearing as tolerated Other Position/Activity Restrictions: posterior hip precautions    Mobility  Bed Mobility Overal bed mobility: Needs Assistance Bed Mobility: Supine to Sit     Supine to sit: Mod assist        Transfers Overall transfer level: Needs assistance Equipment used: Rolling walker (2 wheeled);None Transfers: Sit to/from [facility] Sit to Stand: Max assist;+2 physical assistance;From elevated surface Stand pivot transfers: Max assist;+2 physical assistance;From elevated surface       General transfer comment: uanble to step with walker and max a x 2  Ambulation/Gait                  Stairs             Wheelchair Mobility    Modified Rankin (Stroke Patients Only)       Balance Overall balance assessment: Needs assistance;History of Falls Sitting-balance support: Feet supported;Single extremity supported Sitting balance-Leahy Scale: Fair     Standing balance support: Bilateral upper extremity supported Standing balance-Leahy Scale: Zero Standing balance comment: Pt only able to maintain balance with assistance from therapists.                            Cognition Arousal/Alertness: Awake/alert Behavior During Therapy: Flat affect Overall Cognitive Status: Within Functional Limits for tasks assessed                                 General Comments: Pt appears to be improving cognitively. States she, "was pretty confused yesterday". Is able to follow 1 step VCs with increased time to process at times.      Exercises Other Exercises Other Exercises: to commode to attempt BM Other Exercises: Pt assisted to [facility] on this date. Required +2 HH assist with mod/max assist.    General Comments        Pertinent Vitals/Pain Pain Assessment: Faces Pain Score: 5  Faces Pain Scale: Hurts little more Pain Location: R hip with mobility. Pain Descriptors / Indicators: Operative site guarding;Grimacing;Sore Pain Intervention(s): Limited activity within patient's tolerance;Monitored  during session    Home Living Family/patient expects to be discharged to:: Private residence Living Arrangements: Children Available Help at Discharge: Family;Available 24 hours/day Type of Home: House Home Access: Stairs to enter Entrance Stairs-Rails: Right;Left;Can reach both Home Layout: One level Home Equipment: Environmental consultantWalker - 4 wheels      Prior Function Level of Independence: Independent with assistive device(s)      Comments: history of falls, used Rollator   PT Goals (current goals can now be found in the care plan section) Acute Rehab PT  Goals Patient Stated Goal: to go home and continue to feel better Progress towards PT goals: Progressing toward goals    Frequency           PT Plan Current plan remains appropriate    Co-evaluation PT/OT/SLP Co-Evaluation/Treatment: Yes Reason for Co-Treatment: For patient/therapist safety;To address functional/ADL transfers PT goals addressed during session: Mobility/safety with mobility;Balance OT goals addressed during session: ADL's and self-care;Proper use of Adaptive equipment and DME      AM-PAC PT "6 Clicks" Mobility   Outcome Measure  Help needed turning from your back to your side while in a flat bed without using bedrails?: A Lot Help needed moving from lying on your back to sitting on the side of a flat bed without using bedrails?: A Lot Help needed moving to and from a bed to a chair (including a wheelchair)?: Total Help needed standing up from a chair using your arms (e.g., wheelchair or bedside chair)?: Total Help needed to walk in hospital room?: Total Help needed climbing 3-5 steps with a railing? : Total 6 Click Score: 8    End of Session Equipment Utilized During Treatment: Gait belt Activity Tolerance: Patient limited by fatigue;Patient limited by pain Patient left: in chair;with call bell/phone within reach;with chair alarm set   Pain - Right/Left: Right Pain - part of body: Hip     Time: [id number] PT Time Calculation (min) (ACUTE ONLY): 25 min  Charges:  $Therapeutic Activity: 23-37 mins                     Danielle DessSarah Karuna Balducci, PTA 04/15/19, 1:49 PM

## 2019-04-15 NOTE — Care Management Important Message (Signed)
Important Message  Patient Details  Name: Rhonda Griffith MRN: [id number] Date of Birth: 1946-06-02   Medicare Important Message Given:  Yes     [name] 04/15/2019, 11:41 AM

## 2019-04-15 NOTE — Progress Notes (Signed)
[employer] - [place] [facility]                                                                                                                                                                                  Patient Demographics   Myrtice LauthVirginia Garbers, is a 73 y.o. female, DOB - 11/29/1945, ZOX:[id number]  Admit date - 04/10/2019   Admitting Physician Hannah BeatJan A Mansy, MD  Outpatient Primary MD for the patient is Shapely-Quinn, Desiree Lucyodd [place], MD   LOS - 5  Subjective:  right hip pain is under control S/p right hip hemiarthroplasty- 04/13/19 Did not have BM yet.  Review of Systems:   CONSTITUTIONAL: No documented fever. No fatigue, weakness. No weight gain, no weight loss.  EYES: No blurry or double vision.  ENT: No tinnitus. No postnasal drip. No redness of the oropharynx.  RESPIRATORY: No cough, no wheeze, no hemoptysis. No dyspnea.  CARDIOVASCULAR: No chest pain. No orthopnea. No palpitations. No syncope.  GASTROINTESTINAL: No nausea, no vomiting or diarrhea. No abdominal pain. No melena or hematochezia.  GENITOURINARY: No dysuria or hematuria.  ENDOCRINE: No polyuria or nocturia. No heat or cold intolerance.  HEMATOLOGY: No anemia. No bruising. No bleeding.  INTEGUMENTARY: No rashes. No lesions.  MUSCULOSKELETAL: Right hip pain NEUROLOGIC: No numbness, tingling, or ataxia. No seizure-type activity.  PSYCHIATRIC: No anxiety. No insomnia. No ADD.    Vitals:   Vitals:   04/14/19 2018 04/14/19 2110 04/14/19 2318 04/15/19 0758  BP:  (!) 147/47 (!) 141/47 (!) 137/49  Pulse: (!) 47 73 (!) 59 71  Resp:   19   Temp:   99 F (37.2 C) 98.8 F (37.1 C)  TempSrc:   Oral Oral  SpO2: 97% 100% 99% 96%  Weight:      Height:        Wt Readings from Last 3 Encounters:  04/10/19 77.1 kg  02/26/19 72.6 kg  09/21/18 74.8 kg     Intake/Output Summary (Last 24 hours) at 04/15/2019 1259 Last data filed at 04/15/2019 1236 Gross per 24 hour  Intake 360 ml  Output 2235 ml   Net -1875 ml    Physical Exam:   GENERAL: Pleasant-appearing in no apparent distress.  HEAD, EYES, EARS, NOSE AND THROAT: Atraumatic, normocephalic. Extraocular muscles are intact. Pupils equal and reactive to light. Sclerae anicteric. No conjunctival injection. No oro-pharyngeal erythema.  NECK: Supple. There is no jugular venous distention. No bruits, no lymphadenopathy, no thyromegaly.  HEART: Regular rate and rhythm,. No murmurs, no rubs, no clicks.  LUNGS: Clear to auscultation bilaterally. No rales or rhonchi. No wheezes.  ABDOMEN: Soft, flat, nontender, nondistended. Has good bowel sounds. No hepatosplenomegaly appreciated.  EXTREMITIES: No evidence of any cyanosis, clubbing, or peripheral edema.  +2 pedal and radial pulses bilaterally.  NEUROLOGIC: The patient is alert, awake, and oriented x3 with no focal motor or sensory deficits appreciated bilaterally.  SKIN: Moist and warm with no rashes appreciated.  Psych: Not anxious, depressed LN: No inguinal LN enlargement    Antibiotics   Anti-infectives (From admission, onward)   Start     Dose/Rate Route Frequency Ordered Stop   04/13/19 1400  clindamycin (CLEOCIN) IVPB 600 mg     600 mg 100 mL/hr over 30 Minutes Intravenous Every 6 hours 04/13/19 1356 04/13/19 2043   04/10/19 2330  clindamycin (CLEOCIN) IVPB 600 mg    Note to Pharmacy: SEND WITH THE PATIENT TO THE OR. DO NOT ADMINISTER ON THE UNIT!   600 mg 100 mL/hr over 30 Minutes Intravenous To Surgery 04/10/19 2243 04/11/19 2330      Medications   Scheduled Meds: . atorvastatin  80 mg Oral QPM  . cholecalciferol  2,000 Units Oral Q30 days  . darifenacin  7.5 mg Oral Daily  . diltiazem  120 mg Oral Daily  . DULoxetine  60 mg Oral QHS  . enoxaparin (LOVENOX) injection  40 mg Subcutaneous Q24H  . feeding supplement (ENSURE ENLIVE)  237 mL Oral BID BM  . ferrous sulfate  325 mg Oral BID WC  . fluticasone  2 spray Each Nare Daily  . gabapentin  100 mg Oral BID  .  magnesium oxide  400 mg Oral BID  . pantoprazole  40 mg Oral Daily  . polyethylene glycol  17 g Oral Daily  . rOPINIRole  2 mg Oral QHS  . senna-docusate  1 tablet Oral BID  . senna-docusate  1 tablet Oral BID  . ascorbic acid  500 mg Oral Daily  . warfarin  2.5 mg Oral ONCE-1800  . Warfarin - Pharmacist Dosing Inpatient   Does not apply q1800   Continuous Infusions:  PRN Meds:.acetaminophen, bisacodyl, diazepam, HYDROmorphone (DILAUDID) injection, magnesium hydroxide, menthol-cetylpyridinium **OR** phenol, metoCLOPramide **OR** metoCLOPramide (REGLAN) injection, ondansetron **OR** ondansetron (ZOFRAN) IV, oxyCODONE, oxyCODONE, sodium phosphate, traMADol   Data Review:   Micro Results Recent Results (from the past 240 hour(s))  SARS Coronavirus 2 (CEPHEID- Performed [facility]), Hosp Order     Status: None   Collection Time: 04/10/19 11:19 PM   Specimen: Nasopharyngeal Swab  Result Value Ref Range Status   SARS Coronavirus 2 NEGATIVE NEGATIVE Final    Comment: (NOTE) If result is NEGATIVE SARS-CoV-2 target nucleic acids are NOT DETECTED. The SARS-CoV-2 RNA is generally detectable in upper and lower  respiratory specimens during the acute phase of infection. The lowest  concentration of SARS-CoV-2 viral copies this assay can detect is 250  copies / mL. A negative result does not preclude SARS-CoV-2 infection  and should not be used as the sole basis for treatment or other  patient management decisions.  A negative result may occur with  improper specimen collection / handling, submission of specimen other  than nasopharyngeal swab, presence of viral mutation(s) within the  areas targeted by this assay, and inadequate number of viral copies  (<250 copies / mL). A negative result must be combined with clinical  observations, patient history, and epidemiological information. If result is POSITIVE SARS-CoV-2 target nucleic acids are DETECTED. The SARS-CoV-2 RNA is  generally detectable in upper and lower  respiratory specimens dur ing the acute phase of infection.  Positive  results are indicative of active  infection with SARS-CoV-2.  Clinical  correlation with patient history and other diagnostic information is  necessary to determine patient infection status.  Positive results do  not rule out bacterial infection or co-infection with other viruses. If result is PRESUMPTIVE POSTIVE SARS-CoV-2 nucleic acids MAY BE PRESENT.   A presumptive positive result was obtained on the submitted specimen  and confirmed on repeat testing.  While 2019 novel coronavirus  (SARS-CoV-2) nucleic acids may be present in the submitted sample  additional confirmatory testing may be necessary for epidemiological  and / or clinical management purposes  to differentiate between  SARS-CoV-2 and other Sarbecovirus currently known to infect humans.  If clinically indicated additional testing with an alternate test  methodology [id number]) is advised. The SARS-CoV-2 RNA is generally  detectable in upper and lower respiratory sp ecimens during the acute  phase of infection. The expected result is Negative. Fact Sheet for Patients:  BoilerBrush.com.cy Fact Sheet for Healthcare Providers: https://pope.com/ This test is not yet approved or cleared by the [place] FDA and has been authorized for detection and/or diagnosis of SARS-CoV-2 by FDA under an Emergency Use Authorization (EUA).  This EUA will remain in effect (meaning this test can be used) for the duration of the COVID-19 declaration under Section 564(b)(1) of the Act, 21 U.S.C. section 360bbb-3(b)(1), unless the authorization is terminated or revoked sooner. Performed at [facility], [address] [place], [place] [address]   MRSA PCR Screening     Status: None   Collection Time: 04/11/19  2:35 AM   Specimen: Nasal Mucosa; Nasopharyngeal  Result Value  Ref Range Status   MRSA by PCR NEGATIVE NEGATIVE Final    Comment:        The GeneXpert MRSA Assay (FDA approved for NASAL specimens only), is one component of a comprehensive MRSA colonization surveillance program. It is not intended to diagnose MRSA infection nor to guide or monitor treatment for MRSA infections. Performed at [facility], [address] [place], [place] [address]     Radiology Reports Dg Chest 1 View  Result Date: 04/09/2019 CLINICAL DATA:  Right hip pain after fall. EXAM: CHEST  1 VIEW COMPARISON:  Radiographs of March 24, 2018. FINDINGS: Stable cardiomegaly. Status post cardiac valve repair. Left-sided pacemaker is unchanged in position. Atherosclerosis of thoracic aorta is noted. Both lungs are clear. The visualized skeletal structures are unremarkable. IMPRESSION: No active disease. Aortic Atherosclerosis (ICD10-I70.0). Electronically Signed   By: Lupita Raider M.D.   On: 04/09/2019 16:14   Ct Head Wo Contrast  Result Date: 04/10/2019 CLINICAL DATA:  73 y/o  F; fall with right hip pain. EXAM: CT HEAD WITHOUT CONTRAST TECHNIQUE: Contiguous axial images were obtained from the base of the skull through the vertex without intravenous contrast. COMPARISON:  08/03/2018 CT head. FINDINGS: Brain: No evidence of acute infarction, hemorrhage, extra-axial collection or mass lesion/mass effect. Stable chronic infarcts in right basal ganglia, left thalamus, and right cerebellum. Stable chronic hydrocephalus. Stable chronic microvascular ischemic changes of white matter and volume loss of the brain. Vascular: Calcific atherosclerosis of internal carotid arteries. No hyperdense vessel. Skull: Normal. Negative for fracture or focal lesion. Sinuses/Orbits: No acute finding. Other: None. IMPRESSION: 1. No acute intracranial abnormality identified. 2. Stable chronic microvascular ischemic changes and volume loss of the brain. 3. Stable chronic infarcts in right basal ganglia,  left thalamus, and right cerebellum. 4. Stable chronic hydrocephalus. Electronically Signed   By: Mitzi Hansen M.D.   On: 04/10/2019 22:13  Ct Hip Right Wo Contrast  Result Date: 04/09/2019 CLINICAL DATA:  Right hip pain status post fall EXAM: CT OF THE RIGHT HIP WITHOUT CONTRAST TECHNIQUE: Multidetector CT imaging of the right hip was performed according to the standard protocol. Multiplanar CT image reconstructions were also generated. COMPARISON:  None. FINDINGS: Bones/Joint/Cartilage No fracture or dislocation. Normal alignment. No joint effusion. Mild osteoarthritis of the right hip. Heterogeneous marrow of the proximal femoral diaphysis which may reflect red marrow or hemorrhage. Ligaments Ligaments are suboptimally evaluated by CT. Muscles and Tendons Muscles are normal.  No intramuscular fluid collection. Soft tissue No fluid collection or hematoma.  No soft tissue mass. IMPRESSION: No acute osseous injury of the right hip. Heterogeneous marrow of the proximal femoral diaphysis which may reflect red marrow or hemorrhage. Recommend further evaluation with MR of the femur. Electronically Signed   By: Elige KoHetal  Patel   On: 04/09/2019 17:10   Dg Chest Portable 1 View  Result Date: 04/10/2019 CLINICAL DATA:  Status post fall with right hip pain. EXAM: PORTABLE CHEST 1 VIEW COMPARISON:  April 09, 2019 FINDINGS: The heart size and mediastinal contours are stable. Cardiac valvular replacement ring and cardiac pacemaker are unchanged. The heart size is enlarged. The lungs are hyperinflated. There is no focal infiltrate, pulmonary edema, or pleural effusion. The visualized skeletal structures are unremarkable. IMPRESSION: No active cardiopulmonary disease. Electronically Signed   By: Sherian ReinWei-Chen  Lin M.D.   On: 04/10/2019 21:48   Dg Hip Port Unilat With Pelvis 1v Right  Result Date: 04/13/2019 CLINICAL DATA:  Postop right hip arthroplasty. EXAM: DG HIP (WITH OR WITHOUT PELVIS) 1V PORT RIGHT  COMPARISON:  04/10/2019 FINDINGS: Evidence of patient's recent interval unipolar right hip arthroplasty with prosthesis intact and normally positioned. Surgical drain over the right hip joint and adjacent skin staples over the right hip. Remainder of the exam is unchanged. IMPRESSION: Expected changes post right hip arthroplasty. Electronically Signed   By: Elberta Fortisaniel  Boyle M.D.   On: 04/13/2019 13:35   Dg Hip Unilat W Or Wo Pelvis 2-3 Views Right  Result Date: 04/10/2019 CLINICAL DATA:  Status post fall with right hip pain. EXAM: DG HIP (WITH OR WITHOUT PELVIS) 2-3V RIGHT COMPARISON:  April 09, 2019 FINDINGS: There is deformity of the proximal right femur in the femoral neck region suspicious for fracture. No other acute fracture dislocation is identified. IMPRESSION: There is deformity of the proximal right femur in the femoral neck region suspicious for fracture. Electronically Signed   By: Sherian ReinWei-Chen  Lin M.D.   On: 04/10/2019 21:47   Dg Hip Unilat W Or Wo Pelvis 2-3 Views Right  Result Date: 04/09/2019 CLINICAL DATA:  Right hip pain after fall. EXAM: DG HIP (WITH OR WITHOUT PELVIS) 2-3V RIGHT COMPARISON:  None. FINDINGS: There is no evidence of hip fracture or dislocation. There is no evidence of arthropathy or other focal bone abnormality. IMPRESSION: Negative. Electronically Signed   By: Lupita RaiderJames  Green Jr M.D.   On: 04/09/2019 16:12     CBC Recent Labs  Lab 04/10/19 2051 04/11/19 0253 04/13/19 0438 04/15/19 0352  WBC 9.2 10.2 8.5 10.1  HGB 12.0 10.7* 10.2* 8.5*  HCT 38.6 34.2* 33.0* 26.9*  PLT 277 216 200 238  MCV 98.0 97.2 99.4 96.8  MCH 30.5 30.4 30.7 30.6  MCHC 31.1 31.3 30.9 31.6  RDW 12.6 12.7 12.2 12.1  LYMPHSABS 1.3  --   --   --   MONOABS 0.8  --   --   --  EOSABS 0.2  --   --   --   BASOSABS 0.1  --   --   --     Chemistries  Recent Labs  Lab 04/10/19 2051 04/11/19 0253 04/13/19 0438 04/15/19 0352  NA 137 137 139 136  K 4.1 4.8 4.9 4.5  CL 104 105 103 105  CO2 26 26  29 26   GLUCOSE 98 108* 123* 142*  BUN 20 22 32* 24*  CREATININE 1.07* 1.38* 1.20* 1.08*  CALCIUM 10.0 9.4 9.1 9.1  AST 19  --   --   --   ALT 12  --   --   --   ALKPHOS 99  --   --   --   BILITOT 1.0  --   --   --    ------------------------------------------------------------------------------------------------------------------ estimated creatinine clearance is 47 mL/min (A) (by C-G formula based on SCr of 1.08 mg/dL (H)). ------------------------------------------------------------------------------------------------------------------ No results for input(s): HGBA1C in the last 72 hours. ------------------------------------------------------------------------------------------------------------------ No results for input(s): CHOL, HDL, LDLCALC, TRIG, CHOLHDL, LDLDIRECT in the last 72 hours. ------------------------------------------------------------------------------------------------------------------ No results for input(s): TSH, T4TOTAL, T3FREE, THYROIDAB in the last 72 hours.  Invalid input(s): FREET3 ------------------------------------------------------------------------------------------------------------------ No results for input(s): VITAMINB12, FOLATE, FERRITIN, TIBC, IRON, RETICCTPCT in the last 72 hours.  Coagulation profile Recent Labs  Lab 04/11/19 0253 04/12/19 0425 04/13/19 0438 04/14/19 0530 04/15/19 0352  INR 2.8* 1.4* 1.2 1.4* 1.6*    No results for input(s): DDIMER in the last 72 hours.  Cardiac Enzymes No results for input(s): CKMB, TROPONINI, MYOGLOBIN in the last 168 hours.  Invalid input(s): CK ------------------------------------------------------------------------------------------------------------------ Invalid input(s): [place]   1.  right hip fracture - s/p right hemiarthroplasty 04/13/19 -Pain management as per orthopedic team -We will give Lovenox subcu as now INR is subtherapeutic.  2.  Atrial fibrillation -  Also with a history of mitral valve and aortic valve stenosis as well as pacemaker placement -  Held Coumadin for surgery, resume postop- follow INR daily.   3.  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 - Telemetry monitoring   4.  CKD - Will monitor renal function closely. -Renal function creatinine has increased will continue IV fluids  5.  Anemia of blood loss Due to surgery, oral iron therapy.     Code Status Orders  (From admission, onward)         Start     Ordered   04/10/19 2246  Full code  Continuous     04/10/19 2245        Code Status History    Date Active Date Inactive Code Status Order ID Comments User Context   09/21/2018 1355 09/21/2018 1827 Full Code [id number]  Deboraha Sprang, MD Inpatient   Advance Care Planning Activity       Consults orthopedics  DVT Prophylaxis SCDs  Lab Results  Component Value Date   PLT 238 04/15/2019     Time Spent in minutes 66 minutes Vaughan Basta M.D on 04/15/2019 at 12:59 PM  Between 7am to 6pm - Pager - [phone]  After 6pm go to [email or web address] - Proofreader  [employer]   Office  [phone]

## 2019-04-15 NOTE — TOC Progression Note (Signed)
Transition of Care [facility]) - Progression Note    Patient Details  Name: Rhonda Griffith MRN: [id number] Date of Birth: 1945/12/02  Transition of Care [facility]) CM/SW Contact  Zachariah Pavek, Lenice Llamas Phone Number: [phone] 04/15/2019, 11:11 AM  Clinical Narrative:  PT is recommending SNF. Clinical Social Worker (CSW) met with patient and presented SNF bed offers. Patient chose [facility] in [place] and reported that she has been there before. Patient understands that [facility] will have to approve SNF. Per [facility] admissions coordinator at [place] he will start [facility] SNF authorization today. CSW contacted patient's daughter [name] and made her aware of above. CSW will continue to follow and assist as needed.        Expected Discharge Plan: Skilled Nursing Facility Barriers to Discharge: Continued Medical Work up  Expected Discharge Plan and Services Expected Discharge Plan: [place] In-house Referral: Clinical Social Work Discharge Planning Services: CM Consult Post Acute Care Choice: [place] arrangements for the past 2 months: Single Family Home                                       Social Determinants of Health (SDOH) Interventions    Readmission Risk Interventions Readmission Risk Prevention Plan 04/14/2019  Transportation Screening Complete  PCP or Specialist Appt within 3-5 Days Complete  HRI or [place] Not Complete  HRI or Home Care Consult comments Pending PT recs  Social Work Consult for [place] Planning/Counseling Complete  Palliative Care Screening Not Applicable  Medication Review Press photographer) Not Complete  Med Review Comments To be done at time of discharge  Some recent data might be hidden

## 2019-04-15 NOTE — TOC Transition Note (Signed)
Transition of Care [facility]) - CM/SW Discharge Note   Patient Details  Name: Rhonda Griffith MRN: [id number] Date of Birth: 1946-03-25  Transition of Care [facility]) CM/SW Contact:  Pharaoh Pio, Lenice Llamas Phone Number: [phone]  04/15/2019, 3:12 PM   Clinical Narrative: Patient is medically stable for D/C to [facility] today. Per [facility] admissions coordinator at [facility] SNF authorization has been received and patient can come today to room E-13. RN will call report and arrange EMS for transport. Clinical Education officer, museum (CSW) sent D/C orders to [employer] via [employer]. Patient is aware of above. CSW contacted patient's daughter [name] and made her aware of above. Please reconsult if future social work needs arise. CSW signing off.      Final next level of care: [place] Barriers to Discharge: Barriers Resolved   Patient Goals and CMS Choice Patient states their goals for this hospitalization and ongoing recovery are:: Pain control   Choice offered to / list presented to : Patient  Discharge Placement   Existing PASRR number confirmed : 04/12/19          Patient chooses bed at: [facility]) Patient to be transferred to facility by: [facility] Name of family member notified: Patient's daughter [name] is aware of D/C today. Patient and family notified of of transfer: 04/15/19  Discharge Plan and Services In-house Referral: Clinical Social Work Discharge Planning Services: CM Consult Post Acute Care Choice: [place]                               Social Determinants of Health (SDOH) Interventions     Readmission Risk Interventions Readmission Risk Prevention Plan 04/14/2019  Transportation Screening Complete  PCP or Specialist Appt within 3-5 Days Complete  HRI or [place] Not Complete  HRI or Home Care Consult comments Pending PT recs  Social Work Consult for [place] Planning/Counseling  Complete  Palliative Care Screening Not Applicable  Medication Review Press photographer) Not Complete  Med Review Comments To be done at time of discharge  Some recent data might be hidden

## 2019-04-15 NOTE — Progress Notes (Signed)
[place] for warfarin Indication: atrial fibrillation  Allergies  Allergen Reactions  . Morphine Nausea And Vomiting  . Other Other (See Comments)    Several different statins.  . Statins Other (See Comments)    Several different statins.  . Tizanidine Palpitations  . Tape Other (See Comments)    Breaks her skin Allergic to plastic/latex tape. Only use paper tape on patient.  . Tapentadol Other (See Comments)  . Cephalexin Rash  . Latex Rash  . Sulfa Antibiotics Rash and Other (See Comments)    Patient Measurements: Height: 5\' 9"  (175.3 cm) Weight: 170 lb (77.1 kg) IBW/kg (Calculated) : 66.2   Vital Signs: Temp: 98.8 F (37.1 C) (07/20 0758) Temp Source: Oral (07/20 0758) BP: 137/49 (07/20 0758) Pulse Rate: 71 (07/20 0758)  Labs: Recent Labs    04/13/19 0438 04/14/19 0530 04/15/19 0352  HGB 10.2*  --  8.5*  HCT 33.0*  --  26.9*  PLT 200  --  238  LABPROT 15.0 16.8* 18.4*  INR 1.2 1.4* 1.6*  CREATININE 1.20*  --  1.08*    Estimated Creatinine Clearance: 47 mL/min (A) (by C-G formula based on SCr of 1.08 mg/dL (H)).   Medical History: Past Medical History:  Diagnosis Date  . Atrial fibrillation, permanent    a. CHA2DS2VASc = 4-->coumadin;  b. 09/2014 Echo: EF 55-60%, normal wall motion, mild AI/AS, nl MV, mod dil LA, mildly dil RA, mild to mod TR, PASP 52mmHg.  [name] Chronic kidney disease, stage I   . CVA (cerebral infarction)    a. 04/2015 - anticoagulation switched from eliquis to xarelto to coumadin.  . Leukocytosis   . Pacemaker -Medtronic    a. implant for SSS  . PAD (peripheral artery disease) ([place])    a. w/ left lower ext claudication s/p ABI's 04/2015 showing nl ABI on Right with abnl waveforms on left sugg of L SFA dzs.  [name] Restless leg syndrome   . 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    Assessment: 73 year old female presented with hip  pain, now s/p right hip hemiarthroplasty after right femoral neck fracture. Patient on warfarin PTA for afib. Home dose of 2.5 mg all days except 5 mg on Tuesday. Pharmacy consulted to dose warfarin. INR now sub-therapeutic after receiving Vit K prior to surgery.  Date INR Dose 7/18 1.2 5mg  7/19 1.4 2.5mg   7/20     1.6  Goal of Therapy:  INR 2-3 Monitor platelets by anticoagulation protocol: Yes   Plan:  Will order warfarin 2.5mg  x 1 as per home regimen. Recommend continuing to bridge with enoxaparin for minimum of 5 days. Daily INR to guide therapy.  Pharmacy will continue to monitor and adjust per consult.   Paulina Fusi, PharmD, BCPS 04/15/2019 10:16 AM

## 2019-04-16 LAB — SURGICAL PATHOLOGY

## 2019-04-17 LAB — URINE CULTURE: Culture: 80000 — AB

## 2019-04-26 ENCOUNTER — Encounter: Payer: Self-pay | Admitting: *Deleted

## 2019-04-26 ENCOUNTER — Telehealth: Payer: Self-pay | Admitting: Internal Medicine

## 2019-04-26 NOTE — Telephone Encounter (Signed)
Spoke w/ pt daughter, answered all questions. Informed her that she does not need to bring her home monitor to the rehab facility.

## 2019-04-26 NOTE — Telephone Encounter (Signed)
Patient daughter calling Wanted to make office aware that patient has broken hip and will be in a rehab facility for some time Was not sure if it would affect her home remote check Mentioned to her the next home remote check was in Oct  Please call if there are any concerns

## 2019-04-26 NOTE — Progress Notes (Signed)
Opened in error

## 2019-05-30 ENCOUNTER — Ambulatory Visit: Payer: Medicare Other | Admitting: Podiatry

## 2019-06-20 ENCOUNTER — Ambulatory Visit: Payer: Medicare Other | Admitting: Podiatry

## 2019-07-02 ENCOUNTER — Ambulatory Visit (INDEPENDENT_AMBULATORY_CARE_PROVIDER_SITE_OTHER): Payer: Medicare Other | Admitting: *Deleted

## 2019-07-02 DIAGNOSIS — I4821 Permanent atrial fibrillation: Secondary | ICD-10-CM | POA: Diagnosis not present

## 2019-07-02 DIAGNOSIS — I5032 Chronic diastolic (congestive) heart failure: Secondary | ICD-10-CM

## 2019-07-03 LAB — CUP PACEART REMOTE DEVICE CHECK
Battery Remaining Longevity: 137 mo
Battery Voltage: 3.07 V
Brady Statistic RV Percent Paced: 92.95 %
Date Time Interrogation Session: 20201006051550
Implantable Lead Implant Date: 20090317
Implantable Lead Location: 753860
Implantable Lead Model: 5076
Implantable Pulse Generator Implant Date: 20191227
Lead Channel Impedance Value: 342 Ohm
Lead Channel Impedance Value: 418 Ohm
Lead Channel Pacing Threshold Amplitude: 0.75 V
Lead Channel Pacing Threshold Pulse Width: 0.4 ms
Lead Channel Sensing Intrinsic Amplitude: 6.375 mV
Lead Channel Sensing Intrinsic Amplitude: 6.375 mV
Lead Channel Setting Pacing Amplitude: 2.5 V
Lead Channel Setting Pacing Pulse Width: 0.4 ms
Lead Channel Setting Sensing Sensitivity: 1.2 mV

## 2019-07-12 NOTE — Progress Notes (Signed)
Remote pacemaker transmission.   

## 2019-07-15 DIAGNOSIS — S32592A Other specified fracture of left pubis, initial encounter for closed fracture: Secondary | ICD-10-CM | POA: Insufficient documentation

## 2019-07-15 DIAGNOSIS — R296 Repeated falls: Secondary | ICD-10-CM | POA: Insufficient documentation

## 2019-07-22 ENCOUNTER — Ambulatory Visit: Payer: Medicare Other | Admitting: Podiatry

## 2019-08-19 ENCOUNTER — Ambulatory Visit: Payer: Medicare Other | Admitting: Podiatry

## 2019-09-26 ENCOUNTER — Encounter: Payer: Self-pay | Admitting: Podiatry

## 2019-09-26 ENCOUNTER — Ambulatory Visit (INDEPENDENT_AMBULATORY_CARE_PROVIDER_SITE_OTHER): Payer: Medicare Other | Admitting: Podiatry

## 2019-09-26 ENCOUNTER — Other Ambulatory Visit: Payer: Self-pay

## 2019-09-26 DIAGNOSIS — B351 Tinea unguium: Secondary | ICD-10-CM | POA: Diagnosis not present

## 2019-09-26 DIAGNOSIS — M79676 Pain in unspecified toe(s): Secondary | ICD-10-CM | POA: Diagnosis not present

## 2019-09-26 DIAGNOSIS — Q828 Other specified congenital malformations of skin: Secondary | ICD-10-CM

## 2019-09-26 NOTE — Progress Notes (Signed)
Complaint:  Visit Type: Patient returns to my office for continued preventative foot care services. Complaint: Patient states" my nails have grown long and thick and become painful to walk and wear shoes" Patient has been diagnosed with DM with no foot complications. The patient presents for preventative foot care services.  Podiatric Exam: Vascular: dorsalis pedis and posterior tibial pulses are palpable bilateral. Capillary return is immediate. Temperature gradient is WNL. Skin turgor WNL  Sensorium: Normal Semmes Weinstein monofilament test. Normal tactile sensation bilaterally. Nail Exam: Pt has thick disfigured discolored nails with subungual debris noted bilateral entire nail hallux through fifth toenails Ulcer Exam: There is no evidence of ulcer or pre-ulcerative changes or infection. Orthopedic Exam: Muscle tone and strength are WNL. No limitations in general ROM. No crepitus or effusions noted. Foot type and digits show no abnormalities. Plantar flexed metatarsal left foot. Skin:  Porokeratosis sub 5th met left foot. Asymptomatic.[name] No infection or ulcers  Diagnosis:  Onychomycosis, , Pain in right toe, pain in left toes  Treatment & Plan Procedures and Treatment: Consent by patient was obtained for treatment procedures.   Debridement of mycotic and hypertrophic toenails, 1 through 5 bilateral and clearing of subungual debris. No ulceration, no infection noted.  Return Visit-Office Procedure: Patient instructed to return to the office for a follow up visit 3 months for continued evaluation and treatment.    Gardiner Barefoot DPM

## 2019-10-01 ENCOUNTER — Ambulatory Visit (INDEPENDENT_AMBULATORY_CARE_PROVIDER_SITE_OTHER): Payer: Medicare HMO | Admitting: *Deleted

## 2019-10-01 DIAGNOSIS — I4821 Permanent atrial fibrillation: Secondary | ICD-10-CM | POA: Diagnosis not present

## 2019-10-01 LAB — CUP PACEART REMOTE DEVICE CHECK
Battery Remaining Longevity: 134 mo
Battery Voltage: 3.04 V
Brady Statistic RV Percent Paced: 96.2 %
Date Time Interrogation Session: 20210104184813
Implantable Lead Implant Date: 20090317
Implantable Lead Location: 753860
Implantable Lead Model: 5076
Implantable Pulse Generator Implant Date: 20191227
Lead Channel Impedance Value: 361 Ohm
Lead Channel Impedance Value: 418 Ohm
Lead Channel Pacing Threshold Amplitude: 0.75 V
Lead Channel Pacing Threshold Pulse Width: 0.4 ms
Lead Channel Sensing Intrinsic Amplitude: 7.75 mV
Lead Channel Sensing Intrinsic Amplitude: 7.75 mV
Lead Channel Setting Pacing Amplitude: 2.5 V
Lead Channel Setting Pacing Pulse Width: 0.4 ms
Lead Channel Setting Sensing Sensitivity: 1.2 mV

## 2019-12-02 ENCOUNTER — Ambulatory Visit: Payer: Medicare HMO | Attending: Internal Medicine

## 2019-12-02 DIAGNOSIS — Z23 Encounter for immunization: Secondary | ICD-10-CM | POA: Insufficient documentation

## 2019-12-02 NOTE — Progress Notes (Signed)
   Covid-19 Vaccination Clinic  Name:  Rhonda Griffith    MRN: [id number] DOB: [birth year]  12/02/2019  Rhonda Griffith was observed post Covid-19 immunization for 15 minutes without incident. She was provided with Vaccine Information Sheet and instruction to access the V-Safe system.   Rhonda Griffith was instructed to call 911 with any severe reactions post vaccine: [name] Difficulty breathing  . Swelling of face and throat  . A fast heartbeat  . A bad rash all over body  . Dizziness and weakness   Immunizations Administered    Name Date Dose VIS Date Route   Pfizer COVID-19 Vaccine 12/02/2019  9:01 AM 0.3 mL 09/06/2019 Intramuscular   Manufacturer: [employer], [employer]   Lot: [id number]   NDC: [address]-5

## 2019-12-24 ENCOUNTER — Ambulatory Visit: Payer: Medicare HMO | Attending: Internal Medicine

## 2019-12-24 DIAGNOSIS — Z23 Encounter for immunization: Secondary | ICD-10-CM

## 2019-12-24 NOTE — Progress Notes (Signed)
   Covid-19 Vaccination Clinic  Name:  Rhonda Griffith    MRN: [id number] DOB: [birth year]  12/24/2019  [name] was observed post Covid-19 immunization for 15 minutes without incident. She was provided with Vaccine Information Sheet and instruction to access the V-Safe system.   [name] was instructed to call 911 with any severe reactions post vaccine: [name] Difficulty breathing  . Swelling of face and throat  . A fast heartbeat  . A bad rash all over body  . Dizziness and weakness   Immunizations Administered    Name Date Dose VIS Date Route   Pfizer COVID-19 Vaccine 12/24/2019  3:41 PM 0.3 mL 09/06/2019 Intramuscular   Manufacturer: [employer], [employer]   Lot: [id number]   NDC: [address]-2

## 2019-12-26 ENCOUNTER — Ambulatory Visit: Payer: Medicare Other | Admitting: Podiatry

## 2019-12-26 ENCOUNTER — Other Ambulatory Visit: Payer: Self-pay

## 2019-12-26 ENCOUNTER — Encounter: Payer: Self-pay | Admitting: Podiatry

## 2019-12-26 VITALS — Temp 97.2°F

## 2019-12-26 DIAGNOSIS — B351 Tinea unguium: Secondary | ICD-10-CM

## 2019-12-26 DIAGNOSIS — Q828 Other specified congenital malformations of skin: Secondary | ICD-10-CM | POA: Diagnosis not present

## 2019-12-26 DIAGNOSIS — M79676 Pain in unspecified toe(s): Secondary | ICD-10-CM | POA: Diagnosis not present

## 2019-12-26 DIAGNOSIS — N189 Chronic kidney disease, unspecified: Secondary | ICD-10-CM | POA: Diagnosis not present

## 2019-12-26 DIAGNOSIS — I739 Peripheral vascular disease, unspecified: Secondary | ICD-10-CM | POA: Diagnosis not present

## 2019-12-26 NOTE — Progress Notes (Signed)
This patient returns to my office for at risk foot care.  This patient requires this care by a professional since this patient will be at risk due to having PAD, CKD and coagulation defect.   Patient is taking coumadin.  This patient is unable to cut nails himself since the patient cannot reach his nails.These nails are painful walking and wearing shoes.  This patient presents for at risk foot care today.  General Appearance  Alert, conversant and in no acute stress.  Vascular  Dorsalis pedis and posterior tibial  pulses are weakly  palpable  bilaterally.  Capillary return is within normal limits  bilaterally. Temperature is within normal limits  bilaterally.  Neurologic  Senn-Weinstein monofilament wire test within normal limits  bilaterally. Muscle power within normal limits bilaterally.  Nails Thick disfigured discolored nails with subungual debris  from hallux to fifth toes bilaterally. No evidence of bacterial infection or drainage bilaterally.  Orthopedic  No limitations of motion  feet .  No crepitus or effusions noted.  No bony pathology or digital deformities noted.  Skin  Porokeratosis sub 5th met left foot.  No signs of infections or ulcers noted.     Onychomycosis  Pain in right toes  Pain in left toes Porokeratosis sub 5th left foot.  Consent was obtained for treatment procedures.   Mechanical debridement of nails 1-5  bilaterally performed with a nail nipper.  Filed with dremel without incident. Debride porokeratosis sub 5th with # 15 blade.  Return office visit   3 months                   Told patient to return for periodic foot care and evaluation due to potential at risk complications.   Samir Ishaq DPM  

## 2019-12-31 ENCOUNTER — Ambulatory Visit (INDEPENDENT_AMBULATORY_CARE_PROVIDER_SITE_OTHER): Payer: Medicare HMO | Admitting: *Deleted

## 2019-12-31 DIAGNOSIS — I4821 Permanent atrial fibrillation: Secondary | ICD-10-CM

## 2019-12-31 LAB — CUP PACEART REMOTE DEVICE CHECK
Battery Remaining Longevity: 131 mo
Battery Voltage: 3.03 V
Brady Statistic RV Percent Paced: 97.43 %
Date Time Interrogation Session: 20210406013157
Implantable Lead Implant Date: 20090317
Implantable Lead Location: 753860
Implantable Lead Model: 5076
Implantable Pulse Generator Implant Date: 20191227
Lead Channel Impedance Value: 361 Ohm
Lead Channel Impedance Value: 418 Ohm
Lead Channel Pacing Threshold Amplitude: 0.625 V
Lead Channel Pacing Threshold Pulse Width: 0.4 ms
Lead Channel Sensing Intrinsic Amplitude: 5.875 mV
Lead Channel Sensing Intrinsic Amplitude: 5.875 mV
Lead Channel Setting Pacing Amplitude: 2.5 V
Lead Channel Setting Pacing Pulse Width: 0.4 ms
Lead Channel Setting Sensing Sensitivity: 1.2 mV

## 2020-01-01 NOTE — Progress Notes (Signed)
PPM Remote  

## 2020-02-11 ENCOUNTER — Other Ambulatory Visit: Payer: Self-pay

## 2020-02-11 ENCOUNTER — Ambulatory Visit (INDEPENDENT_AMBULATORY_CARE_PROVIDER_SITE_OTHER): Payer: Medicare Other | Admitting: Family

## 2020-02-11 ENCOUNTER — Encounter: Payer: Self-pay | Admitting: Family

## 2020-02-11 VITALS — BP 150/74 | HR 62 | Ht 69.5 in | Wt 165.5 lb

## 2020-02-11 DIAGNOSIS — I4821 Permanent atrial fibrillation: Secondary | ICD-10-CM | POA: Diagnosis not present

## 2020-02-11 DIAGNOSIS — I739 Peripheral vascular disease, unspecified: Secondary | ICD-10-CM

## 2020-02-11 DIAGNOSIS — I10 Essential (primary) hypertension: Secondary | ICD-10-CM | POA: Diagnosis not present

## 2020-02-11 DIAGNOSIS — I5032 Chronic diastolic (congestive) heart failure: Secondary | ICD-10-CM | POA: Diagnosis not present

## 2020-02-11 DIAGNOSIS — Z79899 Other long term (current) drug therapy: Secondary | ICD-10-CM

## 2020-02-11 DIAGNOSIS — Z950 Presence of cardiac pacemaker: Secondary | ICD-10-CM

## 2020-02-11 DIAGNOSIS — I080 Rheumatic disorders of both mitral and aortic valves: Secondary | ICD-10-CM

## 2020-02-11 NOTE — Progress Notes (Signed)
Office Visit    Patient Name: Rhonda Griffith Date of Encounter: 02/11/2020  Primary Care Provider:  Cecile Sheerer, MD Primary Cardiologist:  Ida Rogue, MD Electrophysiologist:  None   Chief Complaint    [name] is a 74 y.o. 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 Atrial fibrillation, permanent ([place])    a. CHA2DS2VASc = 4-->coumadin;  b. 09/2014 Echo: EF 55-60%, normal wall motion, mild AI/AS, nl MV, mod dil LA, mildly dil RA, mild to mod TR, PASP 27mmHg.  [name] Chronic kidney disease, stage I   . CVA (cerebral infarction)    a. 04/2015 - anticoagulation switched from eliquis to xarelto to coumadin.  . Leukocytosis   . Pacemaker -Medtronic    a. implant for SSS  . PAD (peripheral artery disease) ([place])    a. w/ left lower ext claudication s/p ABI's 04/2015 showing nl ABI on Right with abnl waveforms on left sugg of L SFA dzs.  [name] Restless leg syndrome   . 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   Past Surgical History:  Procedure Laterality Date  . bladder injection    . botox bladder    . HIP ARTHROPLASTY Right 04/13/2019   Procedure: ARTHROPLASTY BIPOLAR HIP (HEMIARTHROPLASTY);  Surgeon: Dereck Leep, MD;  Location: [facility] ORS;  Service: Orthopedics;  Laterality: Right;  . INSERT / REPLACE / REMOVE PACEMAKER     implant for SSS  . MELANOMA EXCISION     face  . MITRAL VALVE REPLACEMENT     s/p mechanical valve replaced with bovine valve 2010  . NASAL SINUS SURGERY    . PPM GENERATOR CHANGEOUT N/A 09/21/2018   Procedure: PPM GENERATOR CHANGEOUT;  Surgeon: Deboraha Sprang, MD;  Location: [place] [facility];  Service:  Cardiovascular;  Laterality: N/A;    Allergies  Allergies  Allergen Reactions  . Cephalexin Rash  . Latex Rash  . Morphine Nausea And Vomiting  . Tizanidine Palpitations  . Liothyronine Other (See Comments)  . Other Other (See Comments)    Several different statins.  . Statins Other (See Comments)    Several different statins.  . Tape Other (See Comments)    Breaks her skin Allergic to plastic/latex tape. Only use paper tape on patient. Breaks her skin: Allergic to plastic/latex tape. Only use paper tape on patient.  . Tapentadol Other (See Comments)  . Sulfa Antibiotics Rash and Other (See Comments)    History of Present Illness    Rhonda Griffith is a 74 y.o. 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 She was last seen by Dr. Caryl Comes 02/2019.  Echo 2018 LVEF 55-60%, normal wall motion, trivial AI, MV bioprosthesis with normal function, LA moderately dilated, PASP upper limit of normal.   Presents today with her dad for follow-up.  Daughter shares with me that her mother has been having some memory problems and they have an upcoming follow-up with primary care.  Stays busy watching TV and doing puzzles.   Tells me her left leg feels like it is asleep all the time. It has  not "given out on her". It hurts both at rest and with activity. Has been going on for the last year. Tells me her left foot will turn blue.  We discussed the likely diagnosis of neuropathy.  She denies pain with ambulation.  Tells me it does not get better with rest.  We discussed this is not likely peripheral arterial disease and she prefers to follow-up with her primary care before pursuing additional testing.  Her swelling is primarily in her ankle. Sometimes will swell sometimes partway up her calves. Tells me it is worse if she is sitting around a lot.  Does get  better with elevation.  We discussed a venous insufficiency.  Reports no orthopnea or PND suggestive of heart failure.  She does have Lasix at home to take as needed but her daughter tells me she has not needed it recently.  EKGs/Labs/Other Studies Reviewed:   The following studies were reviewed today:  Echo 12/2016 Left ventricle: The cavity size was normal. There was moderate    concentric hypertrophy. Systolic function was normal. The    estimated ejection fraction was in the range of 55% to 60%. Wall    motion was normal; there were no regional wall motion    abnormalities. The study is not technically sufficient to allow    evaluation of LV diastolic function.  - Aortic valve: There was trivial regurgitation. Mean gradient (S):    6 mm Hg. Valve area (VTI): 2.13 cm^2.  - Mitral valve: A bioprosthesis was present and functioning    normally. Mean gradient (D): 3 mm Hg. Valve area by pressure    half-time: 1.76 cm^2.  - Left atrium: The atrium was moderately dilated.  - Pulmonary arteries: Systolic pressure was at the upper limits of    normal. PA peak pressure: 35 mm Hg (S).    EKG:  EKG is  ordered today.  The ekg ordered today demonstrates ventricular paced rhythm 62 bpm  Recent Labs: 04/10/2019: ALT 12 04/15/2019: BUN 24; Creatinine, Ser 1.08; Hemoglobin 8.5; Platelets 238; Potassium 4.5; Sodium 136  Recent Lipid Panel No results found for: CHOL, TRIG, HDL, CHOLHDL, VLDL, LDLCALC, LDLDIRECT  Home Medications   Current Meds  Medication Sig  . acetaminophen (TYLENOL) 500 MG tablet Take 500 mg by mouth 2 (two) times daily.   [name] albuterol (PROVENTIL) (2.5 MG/3ML) 0.083% nebulizer solution Inhale into the lungs as needed.   [name] alendronate (FOSAMAX) 70 MG tablet Take 70 mg by mouth once a week.   [name] ascorbic acid (VITAMIN C) 500 MG tablet Take 500 mg by mouth daily.  [name] atorvastatin (LIPITOR) 80 MG tablet Take 80 mg by mouth every evening.   . bisacodyl (DULCOLAX) 5 MG EC tablet  Take 1 tablet (5 mg total) by mouth daily as needed (if taking the narcotic pain medication).  . Calcium Carb-Cholecalciferol (OYSTER SHELL CALCIUM) 500-400 MG-UNIT TABS Take by mouth daily.   . Cholecalciferol (VITAMIN D3) 1000 UNITS CAPS Take 2,000 Units by mouth every 30 (thirty) days.   . cyclobenzaprine (FLEXERIL) 5 MG tablet Take by mouth as needed.   . diazepam (VALIUM) 5 MG tablet Take 5 mg by mouth as needed.   . diclofenac Sodium (VOLTAREN) 1 % GEL Apply topically as needed.   . diltiazem (CARDIZEM CD) 120 MG 24 hr capsule Take 1 capsule (120 mg total) by mouth daily.  . DULoxetine (CYMBALTA) 60 MG capsule Take 60 mg by mouth daily.   . ferrous sulfate 325 (65 FE)  MG tablet TAKE 1 TABLET (325 MG TOTAL) BY MOUTH 2 (TWO) TIMES DAILY WITH A MEAL.  . fluticasone (FLONASE) 50 MCG/ACT nasal spray 2 sprays by Each Nare route daily as needed for allergies.  . furosemide (LASIX) 20 MG tablet Take 20 mg by mouth daily as needed (fluid retention/swelling.).   [name] gabapentin (NEURONTIN) 100 MG capsule Take 100 mg in the morning and 200 mg in the evening  . guaiFENesin (MUCINEX) 600 MG 12 hr tablet Take by mouth as needed.   [name] ipratropium-albuterol (DUONEB) 0.5-2.5 (3) MG/3ML SOLN Inhale 3 mLs into the lungs as needed.   . magnesium oxide (MAG-OX) 400 MG tablet Take 400 mg by mouth daily.   [name] oxyCODONE (OXY IR/ROXICODONE) 5 MG immediate release tablet Take 5 mg by mouth as needed.   . pantoprazole (PROTONIX) 40 MG tablet Take 40 mg by mouth daily.   . potassium chloride (KLOR-CON) 10 MEQ tablet Take 10 mEq by mouth as needed. With furosemide  . rOPINIRole (REQUIP) 2 MG tablet Take 2 mg by mouth at bedtime.   . senna (SENOKOT) 8.6 MG tablet Take 1 tablet by mouth as needed.   . warfarin (COUMADIN) 3 MG tablet Take by mouth as directed. 3 mg M,W, F and 2 mg all other days      Review of Systems       Review of Systems  Constitution: Negative for chills, fever and malaise/fatigue.    Cardiovascular: Positive for leg swelling. Negative for chest pain, dyspnea on exertion, near-syncope, orthopnea, palpitations and syncope.  Respiratory: Negative for cough, shortness of breath and wheezing.   Gastrointestinal: Negative for nausea and vomiting.  Neurological: Negative for dizziness, light-headedness and weakness.   All other systems reviewed and are otherwise negative except as noted above.  Physical Exam    VS:  BP (!) 150/74 (BP Location: Right Arm, Patient Position: Sitting, Cuff Size: Normal)   Pulse 62   Ht 5' 9.5" (1.765 m)   Wt 165 lb 8 oz (75.1 kg)   SpO2 99%   BMI 24.09 kg/m  , BMI Body mass index is 24.09 kg/m. GEN: Well nourished, well developed, in no acute distress. HEENT: normal. Neck: Supple, no JVD, carotid bruits, or masses. Cardiac: RRR, no murmurs, rubs, or gallops. No clubbing, cyanosis. Trace edema to bilateral ankle.  Radials/DP/PT 2+ and equal bilaterally.  Respiratory:  Respirations regular and unlabored, clear to auscultation bilaterally. GI: Soft, nontender, nondistended, BS + x 4. MS: No deformity or atrophy. Skin: Warm and dry, no rash.  Varicose veins noted bilateral lower extremity Neuro:  Strength and sensation are intact. Psych: Normal affect.  Assessment & Plan    1. Permanent atrial fibrillation -EKG today shows paced rhythm.  Appropriately anticoagulated on warfarin. 2. CHB s/p PPM -remote device check in March was appropriate.  Upcoming in person device check with Dr. Graciela Husbands. 3. Venous insufficiency -reports intermittent lower extremity edema.  Encouraged to elevate her lower extremities when sitting, wear compression stockings, eat a low-sodium diet.  She does have Lasix to use as needed but has not needed recently.  Anticipate lifestyle changes will give her good management. 4. Left leg numbness -reports sensation of left leg numbness and tingling.  Occurs both at rest and with activity.  Not suspicious of PAD.  Symptoms sound  consistent with neuropathy.  She is on gabapentin but unclear why.  She was encouraged to discuss with her PCP.  Vascular studies were performed in 2016 showing possible left SFA  disease but as her symptoms are not consistent with claudication only would not pursue additional testing at this time.  Encouraged her to follow-up with her PCP and follow-up with [place] (Dr. Kirke Corin) as needed. 5. HTN -reports home BP routinely 120s to 130s.  Educated to report BP consistently greater than 130.  Continue present antihypertensive regimen 6. Chronic anticoagulation -due to chronic atrial fibrillation.  Denies bleeding complications.  Follows with Coumadin clinic and her primary care provider. 7. Valvular heart disease -s/p bioprosthetic MVR.  Euvolemic and well compensated on exam.  No indication for repeat echo at this time.  Disposition: Follow up in July with Dr. Graciela Husbands as scheduled. Follow up in 6 month(s) with Dr. Mariah Milling or APP.    Alver Sorrow, NP 02/11/2020, 10:18 PM

## 2020-02-11 NOTE — Patient Instructions (Addendum)
Medication Instructions:  No medication changes today.   *If you need a refill on your cardiac medications before your next appointment, please call your pharmacy*   Lab Work: No lab work ordered today.   Testing/Procedures: EKG today shows your pacemaker is functioning appropriately.   Follow-Up: At [facility], you and your health needs are our priority.  As part of our continuing mission to provide you with exceptional heart care, we have created designated Provider Care Teams.  These Care Teams include your primary Cardiologist (physician) and Advanced Practice Providers (APPs -  Physician Assistants and Nurse Practitioners) who all work together to provide you with the care you need, when you need it.  We recommend signing up for the patient portal called "MyChart".  Sign up information is provided on this After Visit Summary.  MyChart is used to connect with patients for Virtual Visits (Telemedicine).  Patients are able to view lab/test results, encounter notes, upcoming appointments, etc.  Non-urgent messages can be sent to your provider as well.   To learn more about what you can do with MyChart, go to [email or web address].    Your next appointment:   6 month(s)  The format for your next appointment:   In Person  Provider:   You may see Ida Rogue, MD or one of the following Advanced Practice Providers on your designated Care Team:    Murray Hodgkins, NP  Christell Faith, PA-C  Marrianne Mood, PA-C  Other Instructions:    Chronic Venous Insufficiency Chronic venous insufficiency is a condition where the leg veins cannot effectively pump blood from the legs to the heart. This happens when the vein walls are either stretched, weakened, or damaged, or when the valves inside the vein are damaged. With the right treatment, you should be able to continue with an active life. This condition is also called venous stasis. What are the causes? Common causes of this  condition include:  High blood pressure inside the veins (venous hypertension).  Sitting or standing too long, causing increased blood pressure in the leg veins.  A blood clot that blocks blood flow in a vein (deep vein thrombosis, DVT).  Inflammation of a vein (phlebitis) that causes a blood clot to form.  Tumors in the pelvis that cause blood to back up. What increases the risk? The following factors may make you more likely to develop this condition:  Having a family history of this condition.  Obesity.  Pregnancy.  Living without enough regular physical activity or exercise (sedentary lifestyle).  Smoking.  Having a job that requires long periods of standing or sitting in one place.  Being a certain age. Women in their 43s and 94s and men in their 25s are more likely to develop this condition. What are the signs or symptoms? Symptoms of this condition include:  Veins that are enlarged, bulging, or twisted (varicose veins).  Skin breakdown or ulcers.  Reddened skin or dark discoloration of skin on the leg between the knee and ankle.  Brown, smooth, tight, and painful skin just above the ankle, usually on the inside of the leg (lipodermatosclerosis).  Swelling of the legs. How is this diagnosed? This condition may be diagnosed based on:  Your medical history.  A physical exam. How is this treated? The goals of treatment are to help you return to an active life and to minimize pain or disability. Treatment depends on the severity of your condition, and it may include:  Wearing compression stockings. These can help relieve  symptoms and help prevent your condition from getting worse. However, they do not cure the condition.  Sclerotherapy. This procedure involves an injection of a solution that shrinks damaged veins. Follow these instructions at home:      Wear compression stockings as told by your health care provider. These stockings help to prevent blood  clots and reduce swelling in your legs.  Take over-the-counter and prescription medicines only as told by your health care provider.  Stay active by exercising, walking, or doing different activities. Ask your health care provider what activities are safe for you and how much exercise you need.  Drink enough fluid to keep your urine pale yellow.  Do not use any products that contain nicotine or tobacco, such as cigarettes, e-cigarettes, and chewing tobacco. If you need help quitting, ask your health care provider.  Keep all follow-up visits as told by your health care provider. This is important. Contact a health care provider if you:  Have redness, swelling, or more pain in the affected area.  See a red streak or line that goes up or down from the affected area.  Have skin breakdown or skin loss in the affected area, even if the breakdown is small.  Get an injury in the affected area. Get help right away if:  You get an injury and an open wound in the affected area.  You have: ? Severe pain that does not get better with medicine. ? Sudden numbness or weakness in the foot or ankle below the affected area. ? Trouble moving your foot or ankle. ? A fever. ? Worse or persistent symptoms. ? Chest pain. ? Shortness of breath. Summary  Chronic venous insufficiency is a condition where the leg veins cannot effectively pump blood from the legs to the heart.  Chronic venous insufficiency occurs when the vein walls become stretched, weakened, or damaged, or when valves within the vein are damaged.  Treatment depends on how severe your condition is. It often involves wearing compression stockings and may involve having a procedure.  Make sure you stay active by exercising, walking, or doing different activities. Ask your health care provider what activities are safe for you and how much exercise you need. This information is not intended to replace advice given to you by your health care  provider. Make sure you discuss any questions you have with your health care provider. Document Revised: 06/05/2018 Document Reviewed: 06/05/2018 Elsevier Patient Education  2020 [employer].

## 2020-03-26 ENCOUNTER — Ambulatory Visit: Payer: Medicare HMO | Admitting: Podiatry

## 2020-03-31 ENCOUNTER — Ambulatory Visit (INDEPENDENT_AMBULATORY_CARE_PROVIDER_SITE_OTHER): Payer: Medicare Other | Admitting: *Deleted

## 2020-03-31 ENCOUNTER — Encounter: Payer: Self-pay | Admitting: Internal Medicine

## 2020-03-31 ENCOUNTER — Ambulatory Visit (INDEPENDENT_AMBULATORY_CARE_PROVIDER_SITE_OTHER): Payer: Medicare Other | Admitting: Internal Medicine

## 2020-03-31 ENCOUNTER — Other Ambulatory Visit: Payer: Self-pay

## 2020-03-31 VITALS — BP 130/70 | HR 66 | Ht 69.0 in | Wt 165.0 lb

## 2020-03-31 DIAGNOSIS — R001 Bradycardia, unspecified: Secondary | ICD-10-CM

## 2020-03-31 DIAGNOSIS — Z950 Presence of cardiac pacemaker: Secondary | ICD-10-CM

## 2020-03-31 DIAGNOSIS — I4821 Permanent atrial fibrillation: Secondary | ICD-10-CM

## 2020-03-31 DIAGNOSIS — I495 Sick sinus syndrome: Secondary | ICD-10-CM | POA: Diagnosis not present

## 2020-03-31 DIAGNOSIS — I10 Essential (primary) hypertension: Secondary | ICD-10-CM

## 2020-03-31 LAB — CUP PACEART REMOTE DEVICE CHECK
Battery Remaining Longevity: 127 mo
Battery Voltage: 3.02 V
Brady Statistic RV Percent Paced: 97.25 %
Date Time Interrogation Session: 20210705215317
Implantable Lead Implant Date: 20090317
Implantable Lead Location: 753860
Implantable Lead Model: 5076
Implantable Pulse Generator Implant Date: 20191227
Lead Channel Impedance Value: 361 Ohm
Lead Channel Impedance Value: 418 Ohm
Lead Channel Pacing Threshold Amplitude: 0.75 V
Lead Channel Pacing Threshold Pulse Width: 0.4 ms
Lead Channel Sensing Intrinsic Amplitude: 6.875 mV
Lead Channel Sensing Intrinsic Amplitude: 6.875 mV
Lead Channel Setting Pacing Amplitude: 2.5 V
Lead Channel Setting Pacing Pulse Width: 0.4 ms
Lead Channel Setting Sensing Sensitivity: 1.2 mV

## 2020-03-31 NOTE — Progress Notes (Signed)
skf      Patient Care Team: Shapely-Quinn, Desiree Lucy, MD as PCP - General (Family Medicine) Mariah Milling Tollie Pizza, MD as PCP - Cardiology (Cardiology) Antonieta Iba, MD as Consulting Physician (Cardiology) [facility] Salvia, MD as Consulting Physician (Cardiology) Iran Ouch, MD as Consulting Physician (Cardiology)   HPI  Rhonda Griffith is a 74 y.o. female Former patient of Dr. Leonia Reeves who underwent single chamber pacemaker implantation 2000 for symptomatic bradycardia in the context of permanent atrial fibrillation. Gen rep 3/09 with RV lead revision; gen change 2019  History of mitral  valve disease s/p bioprosthetic mitral valve replacement.    DATE TEST EF   1/16    Echo  69 % Moderate LAE   4/18    Echo  60 % LVH/ Severe LAE 52/2.6/73-- nl MV function          Intercurrent hx notable for stroke and was changed from apixoban to Rivaroxaban at [facility]>>coumadin  No bleeding   She is semiambulatory but limited by severe leg pain  No chest pain or dyspnea or edema  .    No palps or syncope  Date Cr K Mg Hgb  12/16 1.27 3.4  11.9  6/18 1.0 3.3 1.6    12/18 1.2 4.3 2.1   11/19 1.57 4.2  13.9  4/21 1.2 4.0  12.1     Thromboembolic risk factors ( age  -2, HTN-1, TIA/CVA-2, Vasc disease -1, Gender-1) for a CHADSVASc Score of >=7    Past Medical History:  Diagnosis Date  . Atrial fibrillation, permanent (HCC)    a. CHA2DS2VASc = 4-->coumadin;  b. 09/2014 Echo: EF 55-60%, normal wall motion, mild AI/AS, nl MV, mod dil LA, mildly dil RA, mild to mod TR, PASP .  [name] Chronic kidney disease, stage I   . CVA (cerebral infarction)    a. 04/2015 - anticoagulation switched from eliquis to xarelto to coumadin.  . Leukocytosis   . Pacemaker -Medtronic    a. implant for SSS  . PAD (peripheral artery disease) (HCC)    a. w/ left lower ext claudication s/p ABI's 04/2015 showing nl ABI on Right with abnl waveforms on left sugg of L SFA dzs.  [name] Restless leg syndrome   .  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    Past Surgical History:  Procedure Laterality Date  . bladder injection    . botox bladder    . HIP ARTHROPLASTY Right 04/13/2019   Procedure: ARTHROPLASTY BIPOLAR HIP (HEMIARTHROPLASTY);  Surgeon: Donato Heinz, MD;  Location: [facility] ORS;  Service: Orthopedics;  Laterality: Right;  . INSERT / REPLACE / REMOVE PACEMAKER     implant for SSS  . MELANOMA EXCISION     face  . MITRAL VALVE REPLACEMENT     s/p mechanical valve replaced with bovine valve 2010  . NASAL SINUS SURGERY    . PPM GENERATOR CHANGEOUT N/A 09/21/2018   Procedure: PPM GENERATOR CHANGEOUT;  Surgeon: [facility] Salvia, MD;  Location: [facility] INVASIVE CV LAB;  Service: Cardiovascular;  Laterality: N/A;    Current Outpatient Medications  Medication Sig Dispense Refill  . acetaminophen (TYLENOL) 500 MG tablet Take 500 mg by mouth 2 (two) times daily.     [name] albuterol (PROVENTIL) (2.5 MG/3ML) 0.083% nebulizer solution Inhale into the lungs as needed.     [name] alendronate (FOSAMAX) 70 MG tablet Take 70 mg by mouth once a  week.     . ascorbic acid (VITAMIN C) 500 MG tablet Take 500 mg by mouth daily.    [name] atorvastatin (LIPITOR) 80 MG tablet Take 80 mg by mouth every evening.     . bisacodyl (DULCOLAX) 5 MG EC tablet Take 1 tablet (5 mg total) by mouth daily as needed (if taking the narcotic pain medication). 20 tablet 0  . Calcium Carb-Cholecalciferol (OYSTER SHELL CALCIUM) 500-400 MG-UNIT TABS Take by mouth daily.     . Cholecalciferol (VITAMIN D3) 1000 UNITS CAPS Take 2,000 Units by mouth every 30 (thirty) days.     . cyclobenzaprine (FLEXERIL) 5 MG tablet Take by mouth as needed.     . diazepam (VALIUM) 5 MG tablet Take 5 mg by mouth as needed.     . diclofenac Sodium (VOLTAREN) 1 % GEL Apply topically as needed.     . diltiazem (CARDIZEM CD) 120 MG 24 hr capsule Take 1 capsule (120 mg total) by mouth daily. 90 capsule 3    . DULoxetine (CYMBALTA) 60 MG capsule Take 60 mg by mouth daily.     . ferrous sulfate 325 (65 FE) MG tablet TAKE 1 TABLET (325 MG TOTAL) BY MOUTH 2 (TWO) TIMES DAILY WITH A MEAL.    [name] FLUoxetine (PROZAC) 10 MG capsule Take 10 mg by mouth daily.     . fluticasone (FLONASE) 50 MCG/ACT nasal spray 2 sprays by Each Nare route daily as needed for allergies.    . furosemide (LASIX) 20 MG tablet Take 20 mg by mouth daily as needed (fluid retention/swelling.).     [name] gabapentin (NEURONTIN) 100 MG capsule Take 100 mg in the morning and 200 mg in the evening    . guaiFENesin (MUCINEX) 600 MG 12 hr tablet Take by mouth as needed.     [name] ipratropium-albuterol (DUONEB) 0.5-2.5 (3) MG/3ML SOLN Inhale 3 mLs into the lungs as needed.     . magnesium oxide (MAG-OX) 400 MG tablet Take 400 mg by mouth daily.     [name] oxyCODONE (OXY IR/ROXICODONE) 5 MG immediate release tablet Take 5 mg by mouth as needed.     . pantoprazole (PROTONIX) 40 MG tablet Take 40 mg by mouth daily.     . potassium chloride (KLOR-CON) 10 MEQ tablet Take 10 mEq by mouth as needed. With furosemide    . rOPINIRole (REQUIP) 2 MG tablet Take 2 mg by mouth at bedtime.     . senna (SENOKOT) 8.6 MG tablet Take 1 tablet by mouth as needed.     . warfarin (COUMADIN) 3 MG tablet Take by mouth as directed. 3 mg M,W, F and 2 mg all other days     No current facility-administered medications for this visit.    Allergies  Allergen Reactions  . Cephalexin Rash  . Latex Rash  . Morphine Nausea And Vomiting  . Tizanidine Palpitations  . Liothyronine Other (See Comments)  . Other Other (See Comments)    Several different statins.  . Statins Other (See Comments)    Several different statins.  . Tape Other (See Comments)    Breaks her skin Allergic to plastic/latex tape. Only use paper tape on patient. Breaks her skin: Allergic to plastic/latex tape. Only use paper tape on patient.  . Tapentadol Other (See Comments)  . Sulfa Antibiotics Rash and  Other (See Comments)    Review of Systems negative except from HPI and PMH  Physical Exam BP 130/70 (BP Location: Right Arm, Patient Position: Sitting, Cuff  Size: Normal)   Pulse 66   Ht 5\' 9"  (1.753 m)   Wt 165 lb (74.8 kg)   SpO2 98%   BMI 24.37 kg/m  Well developed and well nourished in no acute distress HENT normal Neck supple with JVP-flat Clear Device pocket well healed; without hematoma or erythema.  There is no tethering  Regular rate and rhythm, no  gallop No murmur Abd-soft with active BS No Clubbing cyanosis  edema Skin-warm and dry A & Oriented  Grossly normal sensory and motor function sotting in a wheel chair  ECG Atrial fib with V pacing   Assessment and plan  Atrial fibrillation -permanent  Bradycardia  Pacemaker-Medtronic with lead replacement    History of mitral valve replacement, mechanical followed by bioprosthetic most recently 2010  Stroke  Hypertension     On Anticoagulation;  No bleeding issues   INR Hgb followed at [facility] and normal  Euvolemic continue current meds  BP well controlled   Chronic pain   Will ask PCP re pain clinic as narcotics for pain are not tolerated

## 2020-03-31 NOTE — Patient Instructions (Signed)

## 2020-04-02 NOTE — Progress Notes (Signed)
Remote pacemaker transmission.   

## 2020-04-07 ENCOUNTER — Telehealth: Payer: Self-pay | Admitting: Internal Medicine

## 2020-04-07 NOTE — Telephone Encounter (Signed)
Spoke with patient daughter, I will send her out a return kit for the old monitor.

## 2020-04-07 NOTE — Telephone Encounter (Signed)
Patient daughter states they have an old Visual merchandiser monitor. Please advise what they should do with it.

## 2020-04-27 ENCOUNTER — Ambulatory Visit: Payer: Medicare Other | Admitting: Podiatry

## 2020-04-27 ENCOUNTER — Emergency Department: Payer: Medicare Other

## 2020-04-27 ENCOUNTER — Encounter: Payer: Self-pay | Admitting: Emergency Medicine

## 2020-04-27 ENCOUNTER — Emergency Department
Admission: EM | Admit: 2020-04-27 | Discharge: 2020-04-27 | Disposition: A | Discharge status: Home / Self Care | Payer: Medicare Other | Attending: Emergency Medicine | Admitting: Emergency Medicine

## 2020-04-27 ENCOUNTER — Other Ambulatory Visit: Payer: Self-pay

## 2020-04-27 DIAGNOSIS — Z91040 Latex allergy status: Secondary | ICD-10-CM | POA: Diagnosis not present

## 2020-04-27 DIAGNOSIS — I129 Hypertensive chronic kidney disease with stage 1 through stage 4 chronic kidney disease, or unspecified chronic kidney disease: Secondary | ICD-10-CM | POA: Diagnosis not present

## 2020-04-27 DIAGNOSIS — Z96641 Presence of right artificial hip joint: Secondary | ICD-10-CM | POA: Insufficient documentation

## 2020-04-27 DIAGNOSIS — N189 Chronic kidney disease, unspecified: Secondary | ICD-10-CM | POA: Diagnosis not present

## 2020-04-27 DIAGNOSIS — I5023 Acute on chronic systolic (congestive) heart failure: Secondary | ICD-10-CM | POA: Diagnosis not present

## 2020-04-27 DIAGNOSIS — Z950 Presence of cardiac pacemaker: Secondary | ICD-10-CM | POA: Diagnosis not present

## 2020-04-27 DIAGNOSIS — Z79899 Other long term (current) drug therapy: Secondary | ICD-10-CM | POA: Insufficient documentation

## 2020-04-27 DIAGNOSIS — I5033 Acute on chronic diastolic (congestive) heart failure: Secondary | ICD-10-CM

## 2020-04-27 DIAGNOSIS — Z87891 Personal history of nicotine dependence: Secondary | ICD-10-CM | POA: Insufficient documentation

## 2020-04-27 DIAGNOSIS — R0602 Shortness of breath: Secondary | ICD-10-CM | POA: Diagnosis present

## 2020-04-27 LAB — BASIC METABOLIC PANEL
Anion gap: 8 (ref 5–15)
BUN: 28 mg/dL — ABNORMAL HIGH (ref 8–23)
CO2: 26 mmol/L (ref 22–32)
Calcium: 10.2 mg/dL (ref 8.9–10.3)
Chloride: 104 mmol/L (ref 98–111)
Creatinine, Ser: 1.34 mg/dL — ABNORMAL HIGH (ref 0.44–1.00)
GFR calc Af Amer: 44 mL/min — ABNORMAL LOW (ref >60)
GFR calc non Af Amer: 38 mL/min — ABNORMAL LOW (ref >60)
Glucose, Bld: 101 mg/dL — ABNORMAL HIGH (ref 70–99)
Potassium: 4.2 mmol/L (ref 3.5–5.1)
Sodium: 138 mmol/L (ref 135–145)

## 2020-04-27 LAB — PROTIME-INR
INR: 2.1 — ABNORMAL HIGH (ref 0.8–1.2)
Prothrombin Time: 22.9 seconds — ABNORMAL HIGH (ref 11.4–15.2)

## 2020-04-27 LAB — CBC
HCT: 39.9 % (ref 36.0–46.0)
Hemoglobin: 12.7 g/dL (ref 12.0–15.0)
MCH: 30.4 pg (ref 26.0–34.0)
MCHC: 31.8 g/dL (ref 30.0–36.0)
MCV: 95.5 fL (ref 80.0–100.0)
Platelets: 226 10*3/uL (ref 150–400)
RBC: 4.18 MIL/uL (ref 3.87–5.11)
RDW: 13.1 % (ref 11.5–15.5)
WBC: 8.9 10*3/uL (ref 4.0–10.5)
nRBC: 0 % (ref 0.0–0.2)

## 2020-04-27 LAB — TROPONIN I (HIGH SENSITIVITY)
Troponin I (High Sensitivity): 38 ng/L — ABNORMAL HIGH (ref <18)
Troponin I (High Sensitivity): 42 ng/L — ABNORMAL HIGH (ref <18)

## 2020-04-27 LAB — BRAIN NATRIURETIC PEPTIDE: B Natriuretic Peptide: 56 pg/mL (ref 0.0–100.0)

## 2020-04-27 MED ORDER — FUROSEMIDE 10 MG/ML IJ SOLN
40.0000 mg | Freq: Once | INTRAMUSCULAR | Status: AC
  Administered 2020-04-27: 40 mg via INTRAVENOUS
  Filled 2020-04-27: qty 4

## 2020-04-27 NOTE — ED Provider Notes (Signed)
[facility] Emergency Department Provider Note   ____________________________________________   First MD Initiated Contact with Patient 04/27/20 1117     (approximate)  I have reviewed the triage vital signs and the nursing notes.   HISTORY  Chief Complaint Shortness of Breath    HPI [name] is a 74 y.o. female with past medical history of permanent atrial fibrillation on Coumadin, hypertension, stroke, CKD, and diastolic CHF who presents to the ED complaining of shortness of breath.  Patient reports that she woke up this morning gasping for air and feeling like she could not take a deep breath.  She states she has had a dry cough recently but denies any fevers, also denies any chest pain with this episode.  She states her breathing has gradually improved since then, but does not feel all the way back to her baseline.  She also feels like her ankles have been more swollen than usual despite taking her Lasix as prescribed.  She reports making a normal amount of urine, has not had any dysuria or hematuria.  She has not had any fevers, abdominal pain, vomiting, or diarrhea.        Past Medical History:  Diagnosis Date  . Atrial fibrillation, permanent (HCC)    a. CHA2DS2VASc = 4-->coumadin;  b. 09/2014 Echo: EF 55-60%, normal wall motion, mild AI/AS, nl MV, mod dil LA, mildly dil RA, mild to mod TR, PASP .  [name] Chronic kidney disease, stage I   . CVA (cerebral infarction)    a. 04/2015 - anticoagulation switched from eliquis to xarelto to coumadin.  . Leukocytosis   . Pacemaker -Medtronic    a. implant for SSS  . PAD (peripheral artery disease) (HCC)    a. w/ left lower ext claudication s/p ABI's 04/2015 showing nl ABI on Right with abnl waveforms on left sugg of L SFA dzs.  [name] Restless leg syndrome   . 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    Patient Active Problem  List   Diagnosis Date Noted  . Closed right hip fracture, initial encounter (HCC) 04/10/2019  . Degeneration of lumbar intervertebral disc 02/21/2019  . Lumbar radiculopathy 02/21/2019  . Spinal stenosis of lumbar region 02/21/2019  . Cellulitis 08/30/2018  . Closed fracture of distal end of left fibula 06/28/2018  . Acute respiratory failure with hypoxia (HCC) 10/14/2016  . 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 Influenza A 10/14/2016  . Status post cardiac pacemaker procedure 10/14/2016  . Stroke (HCC) 10/14/2016  . ERRONEOUS ENCOUNTER--DISREGARD 07/03/2016  . Carotid bruit 12/22/2015  . Hyperlipidemia 10/23/2015  . Atrial fibrillation, permanent (HCC)   . Pacemaker -Medtronic   . 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 Chronic kidney disease, unspecified   . Encounter for anticoagulation discussion and counseling 06/25/2015  . Acute cardioembolic stroke (HCC) 06/25/2015  . PAD (peripheral artery disease) (HCC) 06/25/2015  . Pure hypercholesterolemia 04/30/2015  . History of recent stroke 04/28/2015  . Essential hypertension 12/09/2014  . Gait instability 09/08/2014  . Status post total bilateral knee replacement 06/24/2014  . UTI (urinary tract infection) 02/03/2014  . Fatigue 10/01/2013  . Shortness of breath 09/12/2012  . Stress disorder, acute 09/12/2012  . Tachycardia 09/12/2012  . Atrial fibrillation (HCC) 09/12/2012  . Melanoma in situ (HCC) 07/11/2012  . Morphea 07/11/2012  . Long term (current) use of anticoagulants 07/02/2012  . History of malignant melanoma of  skin 05/30/2012  . CMC arthritis 02/02/2012  . Sprain of MCL (medial collateral ligament) of knee 01/24/2012  . Squamous cell carcinoma 12/21/2011  . Carpal tunnel syndrome of left wrist 11/28/2011  . Sinoatrial node dysfunction (HCC)   . Movement disorder 08/08/2011  . Edema 06/07/2011  . Chronic diastolic heart failure (HCC) 05/27/2011  . Anxiety state 05/14/2011  . Esophageal reflux 05/14/2011  .  Mitral valve replaced 05/14/2011  . Myalgia and myositis, unspecified 05/14/2011  . OSA (obstructive sleep apnea) 05/14/2011  . Osteoarthritis 05/14/2011  . Pulmonary hypertension (HCC) 05/14/2011  . Cardiac pacemaker in situ 10/03/2010    Past Surgical History:  Procedure Laterality Date  . bladder injection    . botox bladder    . HIP ARTHROPLASTY Right 04/13/2019   Procedure: ARTHROPLASTY BIPOLAR HIP (HEMIARTHROPLASTY);  Surgeon: Donato Heinz, MD;  Location: [facility] ORS;  Service: Orthopedics;  Laterality: Right;  . INSERT / REPLACE / REMOVE PACEMAKER     implant for SSS  . MELANOMA EXCISION     face  . MITRAL VALVE REPLACEMENT     s/p mechanical valve replaced with bovine valve 2010  . NASAL SINUS SURGERY    . PPM GENERATOR CHANGEOUT N/A 09/21/2018   Procedure: PPM GENERATOR CHANGEOUT;  Surgeon: [facility] Salvia, MD;  Location[facility] INVASIVE CV LAB;  Service: Cardiovascular;  Laterality: N/A;    Prior to Admission medications   Medication Sig Start Date End Date Taking? Authorizing Provider  acetaminophen (TYLENOL) 500 MG tablet Take 500 mg by mouth 2 (two) times daily.  07/18/19   [provider]  albuterol (PROVENTIL) (2.5 MG/3ML) 0.083% nebulizer solution Inhale into the lungs as needed.  07/18/19 07/17/20  [provider]  alendronate (FOSAMAX) 70 MG tablet Take 70 mg by mouth once a week.  08/19/19 09/09/20  [provider]  ascorbic acid (VITAMIN C) 500 MG tablet Take 500 mg by mouth daily.    [provider]  atorvastatin (LIPITOR) 80 MG tablet Take 80 mg by mouth every evening.     [provider]  Calcium Carb-Cholecalciferol (OYSTER SHELL CALCIUM) 500-400 MG-UNIT TABS Take by mouth daily.  07/18/19 09/09/20  [provider]  Cholecalciferol (VITAMIN D3) 1000 UNITS CAPS Take 2,000 Units by mouth every 30 (thirty) days.     [provider]  cyclobenzaprine (FLEXERIL) 5 MG tablet Take by mouth as needed.  07/18/19    [provider]  diazepam (VALIUM) 5 MG tablet Take 5 mg by mouth as needed.  07/18/19   [provider]  diclofenac Sodium (VOLTAREN) 1 % GEL Apply topically as needed.  09/17/18   [provider]  diltiazem (CARDIZEM CD) 120 MG 24 hr capsule Take 1 capsule (120 mg total) by mouth daily. 09/06/16   Antonieta Iba, MD  DULoxetine (CYMBALTA) 60 MG capsule Take 60 mg by mouth daily.  05/13/19   [provider]  ferrous sulfate 325 (65 FE) MG tablet TAKE 1 TABLET (325 MG TOTAL) BY MOUTH 2 (TWO) TIMES DAILY WITH A MEAL. 04/15/19   [provider]  FLUoxetine (PROZAC) 10 MG capsule Take 10 mg by mouth daily.  03/04/20 03/04/21  [provider]  fluticasone (FLONASE) 50 MCG/ACT nasal spray 2 sprays by Each Nare route daily as needed for allergies. 06/27/18   [provider]  furosemide (LASIX) 20 MG tablet Take 20 mg by mouth daily as needed (fluid retention/swelling.).     [provider]  gabapentin (NEURONTIN) 100  MG capsule Take 100 mg in the morning and 200 mg in the evening 04/30/19   [provider]  guaiFENesin (MUCINEX) 600 MG 12 hr tablet Take by mouth as needed.     [provider]  ipratropium-albuterol (DUONEB) 0.5-2.5 (3) MG/3ML SOLN Inhale 3 mLs into the lungs as needed.     [provider]  magnesium oxide (MAG-OX) 400 MG tablet Take 400 mg by mouth daily.  07/30/18   [provider]  oxyCODONE (OXY IR/ROXICODONE) 5 MG immediate release tablet Take 5 mg by mouth as needed.     [provider]  pantoprazole (PROTONIX) 40 MG tablet Take 40 mg by mouth daily.     [provider]  potassium chloride (KLOR-CON) 10 MEQ tablet Take 10 mEq by mouth as needed. With furosemide    [provider]  rOPINIRole (REQUIP) 2 MG tablet Take 2 mg by mouth at bedtime.  05/29/15   [provider]  senna (SENOKOT) 8.6 MG tablet Take 1 tablet by mouth as needed.  07/18/19    [provider]  warfarin (COUMADIN) 3 MG tablet Take by mouth as directed. 3 mg M,W, F and 2 mg all other days    [provider]    Allergies Cephalexin, Latex, Morphine, Tizanidine, Liothyronine, Other, Statins, Tape, Tapentadol, and Sulfa antibiotics  Family History  Family history unknown: Yes    Social History Social History   Tobacco Use  . Smoking status: Former Smoker    Packs/day: 1.00    Years: 5.00    Pack years: 5.00    Types: Cigarettes    Quit date: 12/29/1970    Years since quitting: 49.3  . Smokeless tobacco: Never Used  Vaping Use  . Vaping Use: Never used  Substance Use Topics  . Alcohol use: No  . Drug use: No    Review of Systems  Constitutional: No fever/chills Eyes: No visual changes. ENT: No sore throat. Cardiovascular: Denies chest pain. Respiratory: Positive for shortness of breath. Gastrointestinal: No abdominal pain.  No nausea, no vomiting.  No diarrhea.  No constipation. Genitourinary: Negative for dysuria. Musculoskeletal: Negative for back pain. Skin: Negative for rash. Neurological: Negative for headaches, focal weakness or numbness.  ____________________________________________   PHYSICAL EXAM:  VITAL SIGNS: ED Triage Vitals  Enc Vitals Group     BP 04/27/20 0858 (!) 152/73     Pulse Rate 04/27/20 0858 78     Resp 04/27/20 0858 21     Temp 04/27/20 0858 97.8 F (36.6 C)     Temp Source 04/27/20 0858 Oral     SpO2 04/27/20 0858 100 %     Weight 04/27/20 0859 165 lb (74.8 kg)     Height 04/27/20 0859 5\' 9"  (1.753 m)     Head Circumference --      Peak Flow --      Pain Score 04/27/20 0859 0     Pain Loc --      Pain Edu? --      Excl. in GC? --     Constitutional: Alert and oriented. Eyes: Conjunctivae are normal. Head: Atraumatic. Nose: No congestion/rhinnorhea. Mouth/Throat: Mucous membranes are moist. Neck: Normal ROM Cardiovascular: Normal rate, regular rhythm. Grossly normal heart  sounds. Respiratory: Normal respiratory effort.  No retractions.  Crackles to bilateral bases. Gastrointestinal: Soft and nontender. No distention. Genitourinary: deferred Musculoskeletal: No lower extremity tenderness, trace edema to lower legs bilaterally. Neurologic:  Normal speech and language. No gross focal neurologic  deficits are appreciated. Skin:  Skin is warm, dry and intact. No rash noted. Psychiatric: Mood and affect are normal. Speech and behavior are normal.  ____________________________________________   LABS (all labs ordered are listed, but only abnormal results are displayed)  Labs Reviewed  BASIC METABOLIC PANEL - Abnormal; Notable for the following components:      Result Value   Glucose, Bld 101 (*)    BUN 28 (*)    Creatinine, Ser 1.34 (*)    GFR calc non Af Amer 38 (*)    GFR calc Af Amer 44 (*)    All other components within normal limits  PROTIME-INR - Abnormal; Notable for the following components:   Prothrombin Time 22.9 (*)    INR 2.1 (*)    All other components within normal limits  TROPONIN I (HIGH SENSITIVITY) - Abnormal; Notable for the following components:   Troponin I (High Sensitivity) 38 (*)    All other components within normal limits  CBC  BRAIN NATRIURETIC PEPTIDE  TROPONIN I (HIGH SENSITIVITY)   ____________________________________________  EKG  ED ECG REPORT I, Chesley Noonharles Sante Biedermann, the attending physician, personally viewed and interpreted this ECG.   Date: 04/27/2020  EKG Time: 8:57  Rate: 77  Rhythm: Ventricular paced  Axis: LAD  Intervals:none  ST&T Change: None   PROCEDURES  Procedure(s) performed (including Critical Care):  Procedures   ____________________________________________   INITIAL IMPRESSION / ASSESSMENT AND PLAN / ED COURSE       74 year old female with possible history of permanent atrial fibrillation on Coumadin, hypertension, stroke, CKD, and diastolic CHF presents to the ED with shortness of  breath waking her up from sleep earlier this morning.  Her breathing has now improved and she is not in any respiratory distress, maintaining O2 sats on room air.  She does clinically appear fluid overloaded with trace edema to bilateral lower extremities and crackles on lung exam, chest x-ray shows mild pulmonary edema.  Lab work thus far is reassuring, shows patient's baseline chronic kidney disease.  We will add on troponin and INR given patient is on Coumadin, but low suspicion for ACS.  We will diurese with IV Lasix and reassess.  Patient reports feeling much better following diuresis, feels like her breathing is improved and she remained stable on room air.  Initial troponin is mildly elevated, however this appears consistent with her chronic kidney disease.  Repeat troponin is pending, but if this remains stable she will be appropriate for discharge home with PCP and CHF clinic follow-up.  Patient given referral to CHF clinic and counseled to continue her usual Lasix.  Patient turned over to oncoming provider pending repeat troponin results.      ____________________________________________   FINAL CLINICAL IMPRESSION(S) / ED DIAGNOSES  Final diagnoses:  Acute on chronic diastolic congestive heart failure [facility](HCC)     ED Discharge Orders         Ordered    AMB referral to CHF clinic     Discontinue  Reprint     04/27/20 1554           Note:  This document was prepared using Dragon voice recognition software and may include unintentional dictation errors.   Chesley NoonJessup, Irving Lubbers, MD 04/27/20 [phone]

## 2020-04-27 NOTE — ED Provider Notes (Signed)
-----------------------------------------   4:09 PM on 04/27/2020 -----------------------------------------  No significant change in troponin.  Patient is feeling better.  We will discharge home with PCP follow-up.  Discussed return precautions.   Minna Antis, MD 04/27/20 [phone]

## 2020-04-27 NOTE — ED Notes (Signed)
E-signature not working at this time. Pt verbalized understanding of D/C instructions, prescriptions and follow up care with no further questions at this time. Pt in NAD and wheeled to lobby at time of D/C. Pt daughter Lupita Leash called and is going to pick patient up from lobby. Patient advocate keith aware pt waiting in lobby for ride.

## 2020-04-27 NOTE — ED Triage Notes (Signed)
Pt in via [facility] with c/o SOB. Pt denies pain or other sx's just reports she is SOB and has CHF

## 2020-04-27 NOTE — ED Notes (Signed)
PT given phone to call daughter

## 2020-04-29 ENCOUNTER — Telehealth: Payer: Self-pay | Admitting: Family

## 2020-04-29 NOTE — Telephone Encounter (Signed)
Unable to reach patient after I attempted to call twice to schedule a new patient CHF Clinic appointment after we received an ED Referral.     [name], NT

## 2020-05-04 ENCOUNTER — Ambulatory Visit: Payer: Medicare Other | Admitting: Podiatry

## 2020-05-05 ENCOUNTER — Ambulatory Visit: Payer: Medicare Other | Admitting: Family

## 2020-05-05 ENCOUNTER — Telehealth: Payer: Self-pay | Admitting: Family

## 2020-05-05 NOTE — Telephone Encounter (Signed)
Patient did not show for her Heart Failure Clinic appointment on 05/05/20. Will attempt to reschedule.

## 2020-05-12 ENCOUNTER — Telehealth: Payer: Self-pay | Admitting: Cardiovascular Disease

## 2020-05-12 NOTE — Telephone Encounter (Signed)
Pt c/o swelling: STAT is pt has developed SOB within 24 hours  1) How much weight have you gained and in what time span? 6 lb overnight  2) If swelling, where is the swelling located? ankles  3) Are you currently taking a fluid pill? Not at the moment, pt has some available but does not know how much to take  4) Are you currently SOB? no  5) Do you have a log of your daily weights (if so, list)?  8/16 159 8/17 165  6) Have you gained 3 pounds in a day or 5 pounds in a week? 6 overnight  7) Have you traveled recently? no

## 2020-05-12 NOTE — Telephone Encounter (Signed)
Spoke with patients daughter per release form. She did not know that patient could take her furosemide daily due to swelling or weight gain. Reviewed instructions for that to take Furosemide 20 mg once daily as needed and to also take Potassium 10 mEq once daily as needed with the furosemide. Advised that if weight did not go down or if she has a 3 lb weight gain then to please call us back. She verbalized understanding of our conversation, agreement with plan, and had no further questions at this time.

## 2020-05-14 ENCOUNTER — Encounter: Payer: Self-pay | Admitting: Podiatry

## 2020-05-14 ENCOUNTER — Other Ambulatory Visit: Payer: Self-pay

## 2020-05-14 ENCOUNTER — Ambulatory Visit: Payer: Medicare Other | Admitting: Podiatry

## 2020-05-14 DIAGNOSIS — N189 Chronic kidney disease, unspecified: Secondary | ICD-10-CM

## 2020-05-14 DIAGNOSIS — M79676 Pain in unspecified toe(s): Secondary | ICD-10-CM

## 2020-05-14 DIAGNOSIS — Q828 Other specified congenital malformations of skin: Secondary | ICD-10-CM

## 2020-05-14 DIAGNOSIS — I739 Peripheral vascular disease, unspecified: Secondary | ICD-10-CM | POA: Diagnosis not present

## 2020-05-14 DIAGNOSIS — B351 Tinea unguium: Secondary | ICD-10-CM

## 2020-05-14 NOTE — Progress Notes (Signed)
This patient returns to my office for at risk foot care.  This patient requires this care by a professional since this patient will be at risk due to having PAD, CKD and coagulation defect.   Patient is taking coumadin.  This patient is unable to cut nails himself since the patient cannot reach his nails.These nails are painful walking and wearing shoes.  This patient presents for at risk foot care today.  General Appearance  Alert, conversant and in no acute stress.  Vascular  Dorsalis pedis and posterior tibial  pulses are weakly  palpable  bilaterally.  Capillary return is within normal limits  bilaterally. Temperature is within normal limits  bilaterally.  Neurologic  Senn-Weinstein monofilament wire test within normal limits  bilaterally. Muscle power within normal limits bilaterally.  Nails Thick disfigured discolored nails with subungual debris  from hallux to fifth toes bilaterally. No evidence of bacterial infection or drainage bilaterally.  Orthopedic  No limitations of motion  feet .  No crepitus or effusions noted.  No bony pathology or digital deformities noted.  Skin  Porokeratosis sub 5th met left foot.  No signs of infections or ulcers noted.     Onychomycosis  Pain in right toes  Pain in left toes Porokeratosis sub 5th left foot.  Consent was obtained for treatment procedures.   Mechanical debridement of nails 1-5  bilaterally performed with a nail nipper.  Filed with dremel without incident. Debride porokeratosis sub 5th with # 15 blade.  Return office visit   3 months                   Told patient to return for periodic foot care and evaluation due to potential at risk complications.   Gardiner Barefoot DPM

## 2020-06-12 ENCOUNTER — Ambulatory Visit: Payer: Medicare Other | Admitting: Family

## 2020-06-18 NOTE — Progress Notes (Signed)
Patient ID: Rhonda Griffith, female    DOB: 04-22-1946, 74 y.o.   MRN: [id number]  HPI  Rhonda Griffith is a 74 y/o female with a history of HTN, CKD, atrial fibrillation, mitral/ aortic valve stenosis,  restless leg, CVA, PAD, previous tobacco use and chronic heart failure.   Echo report from 01/10/17 reviewed and showed an EF of 55-60% along with moderate LVH and trivial MR.   Was in the ED 04/27/20 due to shortness of breath. IV lasix given with improvement of her symptoms and Rhonda Griffith was released.   Rhonda Griffith presents today for her initial visit with a chief complaint of minimal shortness of breath upon moderate exertion. Rhonda Griffith describes this as chronic in nature having been present for several years. Rhonda Griffith has associated fatigue and pedal edema along with this. Rhonda Griffith denies any difficulty sleeping, dizziness, abdominal distention, palpitations, chest pain, cough or weight gain.   Past Medical History:  Diagnosis Date   Atrial fibrillation, permanent (HCC)    a. CHA2DS2VASc = 4-->coumadin;  b. 09/2014 Echo: EF 55-60%, normal wall motion, mild AI/AS, nl MV, mod dil LA, mildly dil RA, mild to mod TR, PASP .   CHF (congestive heart failure) (HCC)    Chronic kidney disease, stage I    CVA (cerebral infarction)    a. 04/2015 - anticoagulation switched from eliquis to xarelto to coumadin.   Hypertension    Leukocytosis    Pacemaker -Medtronic    a. implant for SSS   PAD (peripheral artery disease) (HCC)    a. w/ left lower ext claudication s/p ABI's 04/2015 showing nl ABI on Right with abnl waveforms on left sugg of L SFA dzs.   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   Past Surgical History:  Procedure Laterality Date   bladder injection     botox bladder     HIP ARTHROPLASTY Right 04/13/2019   Procedure: ARTHROPLASTY BIPOLAR HIP (HEMIARTHROPLASTY);  Surgeon: Donato Heinz, MD;   Location: [facility] ORS;  Service: Orthopedics;  Laterality: Right;   INSERT / REPLACE / REMOVE PACEMAKER     implant for SSS   MELANOMA EXCISION     face   MITRAL VALVE REPLACEMENT     s/p mechanical valve replaced with bovine valve 2010   NASAL SINUS SURGERY     PPM GENERATOR CHANGEOUT N/A 09/21/2018   Procedure: PPM GENERATOR CHANGEOUT;  Surgeon: Duke Salvia, MD;  Location: [facility] INVASIVE CV LAB;  Service: Cardiovascular;  Laterality: N/A;   Family History  Family history unknown: Yes   Social History   Tobacco Use   Smoking status: Former Smoker    Packs/day: 1.00    Years: 5.00    Pack years: 5.00    Types: Cigarettes    Quit date: 12/29/1970    Years since quitting: 49.5   Smokeless tobacco: Never Used  Substance Use Topics   Alcohol use: No   Allergies  Allergen Reactions   Cephalexin Rash   Latex Rash   Morphine Nausea And Vomiting   Tizanidine Palpitations   Liothyronine Other (See Comments)   Other Other (See Comments)    Several different statins.   Statins Other (See Comments)    Several different statins.   Tape Other (See Comments)    Breaks her skin Allergic to plastic/latex tape. Only use paper tape on patient. Breaks her skin: Allergic to plastic/latex  tape. Only use paper tape on patient.   Tapentadol Other (See Comments)   Sulfa Antibiotics Rash and Other (See Comments)   Prior to Admission medications   Medication Sig Start Date End Date Taking? Authorizing Provider  acetaminophen (TYLENOL) 500 MG tablet Take 500 mg by mouth 2 (two) times daily.  07/18/19  Yes [provider]  albuterol (PROVENTIL) (2.5 MG/3ML) 0.083% nebulizer solution Inhale into the lungs as needed.  07/18/19 07/17/20 Yes [provider]  alendronate (FOSAMAX) 70 MG tablet Take 70 mg by mouth once a week.  08/19/19 09/09/20 Yes [provider]  ascorbic acid (VITAMIN C) 500 MG tablet Take 500 mg by mouth daily.   Yes [provider]  atorvastatin (LIPITOR) 80 MG tablet Take 80 mg by mouth every evening.    Yes [provider]  Calcium Carb-Cholecalciferol (OYSTER SHELL CALCIUM) 500-400 MG-UNIT TABS Take by mouth daily.  07/18/19 09/09/20 Yes [provider]  Cholecalciferol (VITAMIN D3) 1000 UNITS CAPS Take 2,000 Units by mouth every 30 (thirty) days.    Yes [provider]  cyclobenzaprine (FLEXERIL) 5 MG tablet Take by mouth as needed.  07/18/19  Yes [provider]  diazepam (VALIUM) 5 MG tablet Take 5 mg by mouth as needed.  07/18/19  Yes [provider]  diclofenac Sodium (VOLTAREN) 1 % GEL Apply topically as needed.  09/17/18  Yes [provider]  diltiazem (CARDIZEM CD) 120 MG 24 hr capsule Take 1 capsule (120 mg total) by mouth daily. 09/06/16  Yes Antonieta Iba, MD  docusate sodium (COLACE) 100 MG capsule Take 100 mg by mouth daily as needed for mild constipation.   Yes [provider]  DULoxetine (CYMBALTA) 60 MG capsule Take 60 mg by mouth daily.  05/13/19  Yes [provider]  ferrous sulfate 325 (65 FE) MG tablet TAKE 1 TABLET (325 MG TOTAL) BY MOUTH 2 (TWO) TIMES DAILY WITH A MEAL. 04/15/19  Yes [provider]  FLUoxetine (PROZAC) 20 MG capsule Take 20 mg by mouth daily. 05/01/20  Yes [provider]  fluticasone (FLONASE) 50 MCG/ACT nasal spray 2 sprays by Each Nare route daily as needed for allergies. 06/27/18  Yes [provider]  furosemide (LASIX) 20 MG tablet Take 20 mg by mouth daily as needed (fluid retention/swelling.).    Yes [provider]  gabapentin (NEURONTIN) 100 MG capsule Take 100 mg in the morning and 200 mg in the evening 04/30/19  Yes [provider]  guaiFENesin (MUCINEX) 600 MG 12 hr tablet Take by mouth as needed.    Yes [provider]  ipratropium-albuterol (DUONEB) 0.5-2.5 (3) MG/3ML SOLN Inhale 3 mLs into the lungs as needed.    Yes [provider]   magnesium oxide (MAG-OX) 400 MG tablet Take 400 mg by mouth daily.  07/30/18  Yes [provider]  oxyCODONE (OXY IR/ROXICODONE) 5 MG immediate release tablet Take 5 mg by mouth as needed.    Yes [provider]  pantoprazole (PROTONIX) 40 MG tablet Take 40 mg by mouth daily.    Yes [provider]  potassium chloride (KLOR-CON) 10 MEQ tablet Take 10 mEq by mouth as needed. With furosemide   Yes [provider]  rOPINIRole (REQUIP) 2 MG tablet Take 2 mg by mouth at bedtime.  05/29/15  Yes [provider]  senna (SENOKOT) 8.6 MG tablet Take 1 tablet by mouth as needed.  07/18/19  Yes [provider]  warfarin (COUMADIN) 2  MG tablet TAKE 1-2 TABS BY MOUTH EACH EVENING AS DIRECTED BY COUMADIN CLINIC. 03/31/20  Yes [provider]  warfarin (COUMADIN) 3 MG tablet Take by mouth as directed. 3 mg M,W, F and 2 mg all other days   Yes [provider]    Review of Systems  Constitutional: Positive for fatigue. Negative for appetite change.  HENT: Negative for congestion, postnasal drip and sore throat.   Eyes: Negative.   Respiratory: Positive for shortness of breath (with moderate exertion). Negative for cough and chest tightness.   Cardiovascular: Positive for leg swelling. Negative for chest pain and palpitations.  Gastrointestinal: Negative for abdominal distention and abdominal pain.  Endocrine: Negative.   Genitourinary: Negative.   Musculoskeletal: Negative for back pain and neck pain.  Skin: Negative.   Allergic/Immunologic: Negative.   Neurological: Negative for dizziness and light-headedness.  Hematological: Negative for adenopathy. Does not bruise/bleed easily.  Psychiatric/Behavioral: Negative for dysphoric mood and sleep disturbance (sleeping on 1 wedge and 1 pillow). The patient is not nervous/anxious.    Vitals:   06/19/20 1124  BP: 137/62  Pulse: 97  Resp: 16  SpO2: 100%  Weight: 176 lb 6 oz (80 kg)  Height:  5\' 9"  (1.753 m)   Wt Readings from Last 3 Encounters:  06/19/20 176 lb 6 oz (80 kg)  04/27/20 165 lb (74.8 kg)  03/31/20 165 lb (74.8 kg)   Lab Results  Component Value Date   CREATININE 1.34 (H) 04/27/2020   CREATININE 1.08 (H) 04/15/2019   CREATININE 1.20 (H) 04/13/2019    Physical Exam Vitals and nursing note reviewed.  Constitutional:      Appearance: Normal appearance.  HENT:     Head: Normocephalic and atraumatic.  Cardiovascular:     Rate and Rhythm: Normal rate. Rhythm irregular.  Pulmonary:     Effort: Pulmonary effort is normal. No respiratory distress.     Breath sounds: Wheezing (few expiratory wheezing bilateral upper lobes) present. No rales.  Abdominal:     General: There is no distension.     Palpations: Abdomen is soft.  Musculoskeletal:        General: No tenderness.     Cervical back: Normal range of motion and neck supple.     Right lower leg: Edema (1+ pitting) present.     Left lower leg: Edema (1+ pitting) present.  Skin:    General: Skin is warm and dry.  Neurological:     General: No focal deficit present.     Mental Status: Rhonda Griffith is alert and oriented to person, place, and time.  Psychiatric:        Mood and Affect: Mood normal.        Behavior: Behavior normal.        Thought Content: Thought content normal.    Assessment & Plan:  1: Chronic heart failure with preserved ejection fraction with structural changes (LVH)- - NYHA class II - euvolemic today - weighing daily; reminded to call for an overnight weight gain of >2 pounds or a weekly weight gain of >5 pounds; has orders in place that should weight gain occur, take furosemide/ potassium daily for 3 days in a row - not adding salt and does read food labels for sodium content; explained that Rhonda Griffith could have 2000mg  sodium/ day - saw cardiology Dan Humphreys([facility]) 02/11/20 & returns 06/30/20 (may need updated echo scheduled) - instructed to get compression socks and put them on every morning with removal  at bedtime - reports receiving both  covid vaccines - BNP 04/27/20 was 56.0  2: HTN- - BP looks good today - saw PCP ([facility]) 05/29/20 - BMP 04/27/20 reviewed and showed sodium 138, potassium 4.2, creatinine 1.34 and GFR 38  3: Permanent atrial fibrillation- - pacemaker present - saw EP Graciela Husbands) 03/31/20 - INR 05/29/20 was 2.0   Medication bottles reviewed.   Return in 2 months or sooner for any questions/problems before then.

## 2020-06-19 ENCOUNTER — Ambulatory Visit: Payer: Medicare Other | Attending: Family | Admitting: Family

## 2020-06-19 ENCOUNTER — Other Ambulatory Visit: Payer: Self-pay

## 2020-06-19 ENCOUNTER — Encounter: Payer: Self-pay | Admitting: Family

## 2020-06-19 VITALS — BP 137/62 | HR 97 | Resp 16 | Ht 69.0 in | Wt 176.4 lb

## 2020-06-19 DIAGNOSIS — Z950 Presence of cardiac pacemaker: Secondary | ICD-10-CM | POA: Insufficient documentation

## 2020-06-19 DIAGNOSIS — Z8673 Personal history of transient ischemic attack (TIA), and cerebral infarction without residual deficits: Secondary | ICD-10-CM | POA: Diagnosis not present

## 2020-06-19 DIAGNOSIS — I130 Hypertensive heart and chronic kidney disease with heart failure and stage 1 through stage 4 chronic kidney disease, or unspecified chronic kidney disease: Secondary | ICD-10-CM | POA: Insufficient documentation

## 2020-06-19 DIAGNOSIS — Z79899 Other long term (current) drug therapy: Secondary | ICD-10-CM | POA: Diagnosis not present

## 2020-06-19 DIAGNOSIS — Z87891 Personal history of nicotine dependence: Secondary | ICD-10-CM | POA: Diagnosis not present

## 2020-06-19 DIAGNOSIS — Z952 Presence of prosthetic heart valve: Secondary | ICD-10-CM | POA: Insufficient documentation

## 2020-06-19 DIAGNOSIS — G2581 Restless legs syndrome: Secondary | ICD-10-CM | POA: Insufficient documentation

## 2020-06-19 DIAGNOSIS — I10 Essential (primary) hypertension: Secondary | ICD-10-CM

## 2020-06-19 DIAGNOSIS — I5032 Chronic diastolic (congestive) heart failure: Secondary | ICD-10-CM

## 2020-06-19 DIAGNOSIS — Z791 Long term (current) use of non-steroidal anti-inflammatories (NSAID): Secondary | ICD-10-CM | POA: Diagnosis not present

## 2020-06-19 DIAGNOSIS — Z7901 Long term (current) use of anticoagulants: Secondary | ICD-10-CM | POA: Insufficient documentation

## 2020-06-19 DIAGNOSIS — N181 Chronic kidney disease, stage 1: Secondary | ICD-10-CM | POA: Diagnosis not present

## 2020-06-19 DIAGNOSIS — I4821 Permanent atrial fibrillation: Secondary | ICD-10-CM

## 2020-06-19 DIAGNOSIS — I080 Rheumatic disorders of both mitral and aortic valves: Secondary | ICD-10-CM | POA: Diagnosis not present

## 2020-06-19 NOTE — Patient Instructions (Addendum)
Continue weighing daily and call for an overnight weight gain of > 2 pounds or a weekly weight gain of >5 pounds.   Put on compression socks daily with removal at bedtime

## 2020-06-29 NOTE — Progress Notes (Deleted)
Cardiology Office Note  Date:  06/29/2020   ID:  Rhonda Griffith, Rhonda Griffith 1946-06-26, MRN [id number]  PCP:  [name], MD   No chief complaint on file.   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 Graciela Husbands July 2021 Underwent generator change 2019  Limited by severe leg pain, sedentary lifestyle    Some SOB Does not take lasix, urine incontinence  only takes as needed for ankle swelling weight stable Presents today in a wheelchair  Napping a lot, often sleeps through lunch Denies any leg swelling Continued gait instability Chronic leg pain Denies any symptoms concerning for claudication  Echo 12/2016 Ef normal Well functioning mitral valve  EKG showing V paced rhythm, rate 66 bpm  Other past medical history reviewed History of heavy vaginal bleeding, none recently Chronic left lower extremity toe drop, foot drop.  Previously had shortness of breath, improved with Lasix.  spends significant time playing on the computer  She does take a pain pill occasionally to sleep better.  Valium for anxiety. severe bilateral knee pain.  Statin was held in the past for leg pain, Zocor.  Previous spinal tap at [facility] for high pressure in her ventricle. She reports no significant improvement after this procedure (drain?).  Placement of a bladder stimulator implant for overactive bladder was denied despite  cardiac clearance . 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  PSH:    Past Surgical History:  Procedure Laterality Date  . bladder injection    . botox bladder    . HIP ARTHROPLASTY Right 04/13/2019   Procedure: ARTHROPLASTY BIPOLAR HIP (HEMIARTHROPLASTY);  Surgeon: Donato Heinz, MD;  Location: [facility] ORS;  Service: Orthopedics;  Laterality: Right;  . INSERT / REPLACE / REMOVE PACEMAKER     implant for SSS  . MELANOMA EXCISION     face  . MITRAL VALVE REPLACEMENT     s/p mechanical valve replaced with bovine valve 2010  . NASAL SINUS SURGERY    . PPM GENERATOR CHANGEOUT N/A 09/21/2018   Procedure: PPM GENERATOR CHANGEOUT;  Surgeon: Duke Salvia, MD;  Location: [facility] INVASIVE CV LAB;  Service: Cardiovascular;  Laterality: N/A;    Current Outpatient Medications  Medication Sig Dispense Refill  . acetaminophen (TYLENOL) 500 MG tablet Take 500 mg by mouth 2 (two) times daily.     [name] albuterol (PROVENTIL) (2.5 MG/3ML) 0.083% nebulizer solution Inhale into the lungs as needed.     [name] alendronate (FOSAMAX) 70 MG tablet Take 70 mg by mouth once a week.     [name] ascorbic acid (VITAMIN C) 500 MG tablet Take 500 mg by mouth daily.    [name]  atorvastatin (LIPITOR) 80 MG tablet Take 80 mg by mouth every evening.     . Calcium Carb-Cholecalciferol (OYSTER SHELL CALCIUM) 500-400 MG-UNIT TABS Take by mouth daily.     . Cholecalciferol (VITAMIN D3) 1000 UNITS CAPS Take 2,000 Units by mouth every 30 (thirty) days.     . cyclobenzaprine (FLEXERIL) 5 MG tablet Take by mouth as needed.     . diazepam (VALIUM) 5 MG tablet Take 5 mg by mouth as needed.     . diclofenac Sodium  (VOLTAREN) 1 % GEL Apply topically as needed.     . diltiazem (CARDIZEM CD) 120 MG 24 hr capsule Take 1 capsule (120 mg total) by mouth daily. 90 capsule 3  . docusate sodium (COLACE) 100 MG capsule Take 100 mg by mouth daily as needed for mild constipation.    . DULoxetine (CYMBALTA) 60 MG capsule Take 60 mg by mouth daily.     . ferrous sulfate 325 (65 FE) MG tablet TAKE 1 TABLET (325 MG TOTAL) BY MOUTH 2 (TWO) TIMES DAILY WITH A MEAL.    [name] FLUoxetine (PROZAC) 20 MG capsule Take 20 mg by mouth daily.    . fluticasone (FLONASE) 50 MCG/ACT nasal spray 2 sprays by Each Nare route daily as needed for allergies.    . furosemide (LASIX) 20 MG tablet Take 20 mg by mouth daily as needed (fluid retention/swelling.).     [name] gabapentin (NEURONTIN) 100 MG capsule Take 100 mg in the morning and 200 mg in the evening    . guaiFENesin (MUCINEX) 600 MG 12 hr tablet Take by mouth as needed.     [name] ipratropium-albuterol (DUONEB) 0.5-2.5 (3) MG/3ML SOLN Inhale 3 mLs into the lungs as needed.     . magnesium oxide (MAG-OX) 400 MG tablet Take 400 mg by mouth daily.     [name] oxyCODONE (OXY IR/ROXICODONE) 5 MG immediate release tablet Take 5 mg by mouth as needed.     . pantoprazole (PROTONIX) 40 MG tablet Take 40 mg by mouth daily.     . potassium chloride (KLOR-CON) 10 MEQ tablet Take 10 mEq by mouth as needed. With furosemide    . rOPINIRole (REQUIP) 2 MG tablet Take 2 mg by mouth at bedtime.     . senna (SENOKOT) 8.6 MG tablet Take 1 tablet by mouth as needed.     . warfarin (COUMADIN) 2 MG tablet TAKE 1-2 TABS BY MOUTH EACH EVENING AS DIRECTED BY COUMADIN CLINIC.    [name] warfarin (COUMADIN) 3 MG tablet Take by mouth as directed. 3 mg M,W, F and 2 mg all other days     No current facility-administered medications for this visit.     Allergies:   Cephalexin, Latex, Morphine, Tizanidine, Liothyronine, Other, Statins, Tape, Tapentadol, and Sulfa antibiotics   Social History:  The patient  reports that she quit smoking  about 49 years ago. Her smoking use included cigarettes. She has a 5.00 pack-year smoking history. She has never used smokeless tobacco. She reports that she does not drink alcohol and does not use drugs.   Family History:   Family history is unknown by patient.    Review of Systems: Review of Systems  Constitutional: Negative.   Respiratory: Negative.   Cardiovascular: Negative.   Gastrointestinal: Negative.   Musculoskeletal: Positive for joint pain and myalgias.       Gait instability  Neurological: Negative.   Psychiatric/Behavioral: Negative.   All other systems reviewed and are negative.    PHYSICAL EXAM:  VS:  There were no vitals taken for this visit. , BMI There is no height or weight on file to calculate BMI. GEN: Well nourished, well developed, in no acute distress , presenting in a wheelchair HEENT: normal  Neck: no JVD, carotid bruits, or masses Cardiac: Irregularly irregular,  no murmurs, rubs, or gallops,no edema  Respiratory:  clear to auscultation bilaterally, normal work of breathing GI: soft, nontender, nondistended, + BS MS: no deformity or atrophy  Skin: warm and dry, no rash Neuro:  Strength and sensation are intact Psych: euthymic mood, full affect    Recent Labs: 04/27/2020: B Natriuretic Peptide 56.0; BUN 28; Creatinine, Ser 1.34; Hemoglobin 12.7; Platelets 226; Potassium 4.2; Sodium 138    Lipid Panel No results found for: CHOL, HDL, LDLCALC, TRIG    Wt Readings from Last 3 Encounters:  06/19/20 176 lb 6 oz (80 kg)  04/27/20 165 lb (74.8 kg)  03/31/20 165 lb (74.8 kg)       ASSESSMENT AND PLAN:  Atrial fibrillation, unspecified type (HCC) - Plan: EKG 12-Lead On anticoagulation,  paced rhythm Followed by Dr. Graciela Husbands  Chronic diastolic heart failure (HCC) - Plan: EKG 12-Lead Previously stopped Lasix on her own given urinary incontinence No leg edema on today's visit, appears euvolemic but she does report some shortness of breath Recommend  she take Lasix for symptoms of shortness of breath, leg swelling, abdominal bloating  Essential hypertension - Plan: EKG 12-Lead Blood pressure elevated today.  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 - Plan: EKG 12-Lead Recent echocardiogram April 2018 with stable valve disease  PAD (peripheral artery disease) (HCC) Mild depression of her ABI lower extremities, She has stable lower extremity pulses Leg pain likely noncardiac/noncardiovascular, chronic arthritis pain  Pure hypercholesterolemia Lipitor is on her list, will confirm she is taking We previously recommended she take half pill  Mitral valve replaced Stable on echo April 2018   Total encounter time more than 25 minutes  Greater than 50% was spent in counseling and coordination of care with the patient   Disposition:   F/U  12 months   No orders of the defined types were placed in this encounter.    Signed, Dossie Arbour, M.D., Ph.D. 06/29/2020  [facility] [place], [place] [phone]

## 2020-06-30 ENCOUNTER — Ambulatory Visit (INDEPENDENT_AMBULATORY_CARE_PROVIDER_SITE_OTHER): Payer: Medicare Other

## 2020-06-30 ENCOUNTER — Ambulatory Visit: Payer: Medicare Other | Admitting: Cardiovascular Disease

## 2020-06-30 DIAGNOSIS — I639 Cerebral infarction, unspecified: Secondary | ICD-10-CM

## 2020-06-30 LAB — CUP PACEART REMOTE DEVICE CHECK
Battery Remaining Longevity: 124 mo
Battery Voltage: 3.02 V
Brady Statistic RV Percent Paced: 98.45 %
Date Time Interrogation Session: 20211005043739
Implantable Lead Implant Date: 20090317
Implantable Lead Location: 753860
Implantable Lead Model: 5076
Implantable Pulse Generator Implant Date: 20191227
Lead Channel Impedance Value: 361 Ohm
Lead Channel Impedance Value: 418 Ohm
Lead Channel Pacing Threshold Amplitude: 0.625 V
Lead Channel Pacing Threshold Pulse Width: 0.4 ms
Lead Channel Sensing Intrinsic Amplitude: 7.25 mV
Lead Channel Sensing Intrinsic Amplitude: 7.25 mV
Lead Channel Setting Pacing Amplitude: 2.5 V
Lead Channel Setting Pacing Pulse Width: 0.4 ms
Lead Channel Setting Sensing Sensitivity: 1.2 mV

## 2020-07-03 NOTE — Progress Notes (Signed)
Remote pacemaker transmission.   

## 2020-07-28 NOTE — Progress Notes (Signed)
Cardiology Office Note  Date:  07/29/2020   ID:  Rhonda Griffith, Rhonda Griffith 11/20/1945, MRN [id number]  PCP:  [name], MD   Chief Complaint  Patient presents with  . other    6 month follow up. Meds reviewed by the pt. verbally. Pt. c/o shortness of breath with little exertion.     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 Graciela Husbands July 2021 Underwent generator change 2019  Presents with daughter, Activity limited by leg weakness which has been getting worse over the past year or 2.  Lots of time in the wheelchair Family getting concerned if legs get weaker they would not be able to care for her at home No falls  Over the summer, not taking lasix Denied eating out, no processed foods,  Denied excess fluid intake   in hospital, 04/27/2020 Woke up with SOB, ankle swelling  CR 1.34, BUN 28 Dry weight now 162 to 165, Taking lasix 20 daily, no recent BMP  No recent echo Echo 12/2016 Ef normal Well functioning mitral valve  EKG showing V paced rhythm, rate 81 bpm  Other past medical history reviewed History of heavy vaginal bleeding, none recently Chronic left lower extremity toe drop, foot drop.  Statin was held in the past for leg pain, Zocor.  Previous spinal tap [facility] for high pressure in her ventricle. She reports no significant improvement after this procedure (drain?).  Placement of a  bladder stimulator implant for overactive bladder was denied despite cardiac clearance .    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  PSH:    Past Surgical History:  Procedure Laterality Date  . bladder injection    . botox bladder    . HIP ARTHROPLASTY Right 04/13/2019   Procedure: ARTHROPLASTY BIPOLAR HIP (HEMIARTHROPLASTY);  Surgeon: Donato Heinz, MD;  Location: [facility] ORS;  Service: Orthopedics;  Laterality: Right;  . INSERT / REPLACE / REMOVE PACEMAKER     implant for SSS  . MELANOMA EXCISION     face  . MITRAL VALVE REPLACEMENT     s/p mechanical valve replaced with bovine valve 2010  . NASAL SINUS SURGERY    . PPM GENERATOR CHANGEOUT N/A 09/21/2018   Procedure: PPM GENERATOR CHANGEOUT;  Surgeon: Duke Salvia, MD;  Location: [facility] INVASIVE CV LAB;  Service: Cardiovascular;  Laterality: N/A;    Current Outpatient Medications  Medication Sig Dispense Refill  . acetaminophen (TYLENOL) 650 MG CR tablet Take 650 mg by mouth every 8 (eight) hours as needed for pain.    [name] albuterol (PROVENTIL) (2.5 MG/3ML) 0.083% nebulizer solution Inhale into the lungs as needed.     [name] alendronate (FOSAMAX) 70 MG tablet  Take 70 mg by mouth once a week.     [name] ascorbic acid (VITAMIN C) 500 MG tablet Take 500 mg by mouth daily.    [name] atorvastatin (LIPITOR) 80 MG tablet Take 80 mg by mouth every evening.     . Calcium Carb-Cholecalciferol (OYSTER SHELL CALCIUM) 500-400 MG-UNIT TABS Take by mouth daily.     . Cholecalciferol (VITAMIN D3) 1000 UNITS CAPS Take 2,000 Units by mouth every 30 (thirty) days.     . cyclobenzaprine (FLEXERIL) 5 MG tablet Take by mouth as needed.     . diazepam (VALIUM) 5 MG tablet Take  5 mg by mouth as needed.     . diclofenac Sodium (VOLTAREN) 1 % GEL Apply topically as needed.     . diltiazem (CARDIZEM CD) 120 MG 24 hr capsule Take 1 capsule (120 mg total) by mouth daily. 90 capsule 3  . docusate sodium (COLACE) 100 MG capsule Take 100 mg by mouth daily as needed for mild constipation.    . DULoxetine (CYMBALTA) 60 MG capsule Take 60 mg by mouth daily.     . ferrous sulfate 325 (65 FE) MG tablet TAKE 1 TABLET (325 MG TOTAL) BY MOUTH 2 (TWO) TIMES DAILY WITH A MEAL.    [name] FLUoxetine (PROZAC) 20 MG capsule Take 20 mg by mouth daily.    . fluticasone (FLONASE) 50 MCG/ACT nasal spray 2 sprays by Each Nare route daily as needed for allergies.    . furosemide (LASIX) 20 MG tablet Take 20 mg by mouth daily as needed (fluid retention/swelling.).     [name] gabapentin (NEURONTIN) 100 MG capsule Take 100 mg in the morning and 200 mg in the evening    . guaiFENesin (MUCINEX) 600 MG 12 hr tablet Take by mouth as needed.     [name] ipratropium-albuterol (DUONEB) 0.5-2.5 (3) MG/3ML SOLN Inhale 3 mLs into the lungs as needed.     . magnesium oxide (MAG-OX) 400 MG tablet Take 400 mg by mouth daily.     [name] oxyCODONE-acetaminophen (PERCOCET/ROXICET) 5-325 MG tablet 1/4-1/2 tab QID prn severe leg pain.    . pantoprazole (PROTONIX) 40 MG tablet Take 40 mg by mouth daily.     . potassium chloride (KLOR-CON) 10 MEQ tablet Take 10 mEq by mouth as needed. With furosemide    . rOPINIRole (REQUIP) 2 MG tablet Take 2 mg by mouth at bedtime.     . senna (SENOKOT) 8.6 MG tablet Take 1 tablet by mouth as needed.     . warfarin (COUMADIN) 2 MG tablet TAKE 1-2 TABS BY MOUTH EACH EVENING AS DIRECTED BY COUMADIN CLINIC.    [name] warfarin (COUMADIN) 3 MG tablet Take by mouth as directed. 3 mg M,W, F and 2 mg all other days    . oxyCODONE (OXY IR/ROXICODONE) 5 MG immediate release tablet Take 5 mg by mouth as needed.  (Patient not taking: Reported on 07/29/2020)     No current facility-administered medications for this visit.      Allergies:   Cephalexin, Latex, Morphine, Tizanidine, Liothyronine, Other, Statins, Tape, Tapentadol, and Sulfa antibiotics   Social History:  The patient  reports that she quit smoking about 49 years ago. Her smoking use included cigarettes. She has a 5.00 pack-year smoking history. She has never used smokeless tobacco. She reports that she does not drink alcohol and does not use drugs.   Family History:   Family history is unknown by patient.    Review of Systems: Review of Systems  Constitutional: Negative.   Respiratory: Negative.   Cardiovascular: Negative.   Gastrointestinal: Negative.   Musculoskeletal: Positive for joint pain and myalgias.       Gait instability  Neurological: Negative.   Psychiatric/Behavioral: Negative.   All other systems reviewed and are negative.    PHYSICAL EXAM: VS:  BP 140/80 (BP Location: Left Arm, Patient Position: Sitting, Cuff Size: Normal)   Pulse 81   Ht 5\' 9"  (1.753 m)   Wt 168 lb (76.2 kg)   SpO2 98%   BMI 24.81 kg/m  , BMI Body mass index is 24.81 kg/m. Constitutional:  oriented to person, place, and time. No distress.  HENT:  Head: Grossly normal Eyes:  no discharge. No scleral icterus.  Neck: No JVD, no carotid bruits  Cardiovascular: Regular rate and rhythm, no murmurs appreciated Pulmonary/Chest: Clear to auscultation bilaterally, no wheezes or rails Abdominal: Soft.  no distension.  no tenderness.  Musculoskeletal: Normal range of motion Neurological:  normal muscle tone. Coordination normal. No atrophy Skin: Skin warm and dry Psychiatric: normal affect, pleasant   Recent Labs: 04/27/2020: B Natriuretic Peptide 56.0; BUN 28; Creatinine, Ser 1.34; Hemoglobin 12.7; Platelets 226; Potassium 4.2; Sodium 138    Lipid Panel No results found for: CHOL, HDL, LDLCALC, TRIG    Wt Readings from Last 3 Encounters:  07/29/20 168 lb (76.2 kg)  06/19/20 176 lb 6 oz (80 kg)  04/27/20 165 lb (74.8 kg)       ASSESSMENT AND  PLAN:  Atrial fibrillation, unspecified type (HCC) - Plan: EKG 12-Lead On anticoagulation,  paced rhythm Followed by Dr. 06/27/20  Chronic diastolic heart failure [facility]) - Plan: EKG 12-Lead Previously stopped Lasix on her own given urinary incontinence Recent lower extremity edema and shortness of breath, now back on Lasix daily Baseline weight appears 162 up to 165, this has been maintained Weights daily at home Appears weight was 176 back in September 2021 now down 10 pounds Long discussion concerning need to restrict some of her fluids, salt intake but they do not sound excessive -Repeat echocardiogram pending  Essential hypertension - Plan: EKG 12-Lead Blood pressure is well controlled on today's visit. No changes made to the medications.  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 - Plan: EKG 12-Lead Recent echocardiogram April 2018 with stable valve disease Repeat echocardiogram pending  PAD (peripheral artery disease) (HCC) Mild depression of her ABI lower extremities, She has stable lower extremity pulses Chronic leg pain noncardiac/noncardiovascular, chronic arthritis pain Very sedentary at baseline Previously took herself off statin, appears back on Lipitor  Pure hypercholesterolemia Continue Lipitor as above  Mitral valve replaced Stable on echo April 2018 Repeat echo pending given trip to the emergency room for acute shortness of breath, weight gain   Total encounter time more than 25 minutes  Greater than 50% was spent in counseling and coordination of care with the patient    Orders Placed This Encounter  Procedures  . Basic metabolic panel  . EKG 12-Lead  . ECHOCARDIOGRAM COMPLETE     Signed, May 2018, M.D., Ph.D. 07/29/2020  [facility] [place], [place]

## 2020-07-29 ENCOUNTER — Encounter: Payer: Self-pay | Admitting: Cardiovascular Disease

## 2020-07-29 ENCOUNTER — Ambulatory Visit (INDEPENDENT_AMBULATORY_CARE_PROVIDER_SITE_OTHER): Payer: Medicare Other | Admitting: Cardiovascular Disease

## 2020-07-29 ENCOUNTER — Other Ambulatory Visit: Payer: Self-pay

## 2020-07-29 VITALS — BP 140/80 | HR 81 | Ht 69.0 in | Wt 168.0 lb

## 2020-07-29 DIAGNOSIS — I5032 Chronic diastolic (congestive) heart failure: Secondary | ICD-10-CM | POA: Diagnosis not present

## 2020-07-29 DIAGNOSIS — I272 Pulmonary hypertension, unspecified: Secondary | ICD-10-CM | POA: Diagnosis not present

## 2020-07-29 DIAGNOSIS — R600 Localized edema: Secondary | ICD-10-CM | POA: Diagnosis not present

## 2020-07-29 DIAGNOSIS — I059 Rheumatic mitral valve disease, unspecified: Secondary | ICD-10-CM

## 2020-07-29 NOTE — Patient Instructions (Addendum)
BMP today  Echocardiogram for MVR, leg edema  Medication Instructions:  No changes  If you need a refill on your cardiac medications before your next appointment, please call your pharmacy.    Lab work: As above   If you have labs (blood work) drawn today and your tests are completely normal, you will receive your results only by: [name] MyChart Message (if you have MyChart) OR . A paper copy in the mail If you have any lab test that is abnormal or we need to change your treatment, we will call you to review the results.   Testing/Procedures:  Your physician has requested that you have an echocardiogram in January 2022.  Echocardiography is a painless test that uses sound waves to create images of your heart. It provides your doctor with information about the size and shape of your heart and how well your heart's chambers and valves are working. This procedure takes approximately one hour. There are no restrictions for this procedure.    Follow-Up: At [facility], you and your health needs are our priority.  As part of our continuing mission to provide you with exceptional heart care, we have created designated Provider Care Teams.  These Care Teams include your primary Cardiologist (physician) and Advanced Practice Providers (APPs -  Physician Assistants and Nurse Practitioners) who all work together to provide you with the care you need, when you need it.  . You will need a follow up appointment in 6 months  . Providers on your designated Care Team:   . Rhonda Ducking, NP . Rhonda Listen, PA-C . Rhonda Ivan, PA-C  Any Other Special Instructions Will Be Listed Below (If Applicable).  COVID-19 Vaccine Information can be found at: [email or web address] For questions related to vaccine distribution or appointments, please email [email or web address] or call [phone].

## 2020-07-30 LAB — BASIC METABOLIC PANEL
BUN/Creatinine Ratio: 15 (ref 12–28)
BUN: 18 mg/dL (ref 8–27)
CO2: 26 mmol/L (ref 20–29)
Calcium: 10.3 mg/dL (ref 8.7–10.3)
Chloride: 107 mmol/L — ABNORMAL HIGH (ref 96–106)
Creatinine, Ser: 1.23 mg/dL — ABNORMAL HIGH (ref 0.57–1.00)
GFR calc Af Amer: 49 mL/min/{1.73_m2} — ABNORMAL LOW (ref >59)
GFR calc non Af Amer: 43 mL/min/{1.73_m2} — ABNORMAL LOW (ref >59)
Glucose: 86 mg/dL (ref 65–99)
Potassium: 4.7 mmol/L (ref 3.5–5.2)
Sodium: 143 mmol/L (ref 134–144)

## 2020-08-05 ENCOUNTER — Telehealth: Payer: Self-pay | Admitting: Cardiovascular Disease

## 2020-08-05 NOTE — Telephone Encounter (Signed)
Attempted to call the patient. No answer- I left a message to please call back.  

## 2020-08-05 NOTE — Telephone Encounter (Signed)
The patient's daughter, Lupita Leash called back. I have reviewed the patient's lab results with her (OK per DPR).  Lupita Leash advised lasix is currently only being taken PRN.

## 2020-08-05 NOTE — Telephone Encounter (Signed)
Antonieta Iba, MD  08/02/2020 3:00 PM EST     Labs Stable renal function on lasix 20 daily Could continue for now, hold lasix for a day if not eating or drinking well

## 2020-08-13 ENCOUNTER — Telehealth: Payer: Self-pay | Admitting: Cardiovascular Disease

## 2020-08-13 ENCOUNTER — Ambulatory Visit: Payer: Medicare Other | Admitting: Family

## 2020-08-13 NOTE — Telephone Encounter (Signed)
Patient daughter calling Wanted to make office aware that she is up 2.4 lbs Patient was given Lasix 20MG  and the potassium  States there doesn't seem to be any swelling  Please call to discuss

## 2020-08-13 NOTE — Telephone Encounter (Signed)
DPR on file. Spoke with [name]. Rhonda Griffith sts that the patient has had a 2.5lb weight gain overnight. Patient denies sob or swelling. Rhonda Griffith sts that the patient is taking Lasix 20 mg prn for weight gain, swelling, or increase sob.  The patient did have cold cuts last night for dinner. Avanell Shackleton that the patient should avoid processed meats and foods since they contain lots of sodium.  The patient was given her Lasix and potassium today and reports an increase in her urine output. Avanell Shackleton that the patient should continue daily weights. Rhonda Griffith sts that they were advised by the HF clinic that the patient should take Lasix 3 days in a row if the pt weight is up. She is to contact the office if the patients weight continues to go up.  Rhonda Griffith agreeable with the plan and voiced appreciation for the call back.

## 2020-08-17 ENCOUNTER — Ambulatory Visit: Payer: Medicare Other | Admitting: Podiatry

## 2020-09-09 ENCOUNTER — Emergency Department: Payer: Medicare Other

## 2020-09-09 ENCOUNTER — Other Ambulatory Visit: Payer: Self-pay | Admitting: Radiology

## 2020-09-09 ENCOUNTER — Encounter: Payer: Self-pay | Admitting: Emergency Medicine

## 2020-09-09 ENCOUNTER — Emergency Department
Admission: EM | Admit: 2020-09-09 | Discharge: 2020-09-09 | Disposition: A | Discharge status: Home / Self Care | Payer: Medicare Other | Source: Home / Self Care | Attending: Emergency Medicine | Admitting: Emergency Medicine

## 2020-09-09 ENCOUNTER — Other Ambulatory Visit: Payer: Self-pay

## 2020-09-09 DIAGNOSIS — W19XXXA Unspecified fall, initial encounter: Secondary | ICD-10-CM

## 2020-09-09 DIAGNOSIS — G2581 Restless legs syndrome: Secondary | ICD-10-CM | POA: Diagnosis present

## 2020-09-09 DIAGNOSIS — I272 Pulmonary hypertension, unspecified: Secondary | ICD-10-CM | POA: Diagnosis present

## 2020-09-09 DIAGNOSIS — I130 Hypertensive heart and chronic kidney disease with heart failure and stage 1 through stage 4 chronic kidney disease, or unspecified chronic kidney disease: Secondary | ICD-10-CM | POA: Insufficient documentation

## 2020-09-09 DIAGNOSIS — I4821 Permanent atrial fibrillation: Secondary | ICD-10-CM | POA: Diagnosis present

## 2020-09-09 DIAGNOSIS — Z7901 Long term (current) use of anticoagulants: Secondary | ICD-10-CM | POA: Insufficient documentation

## 2020-09-09 DIAGNOSIS — G4733 Obstructive sleep apnea (adult) (pediatric): Secondary | ICD-10-CM | POA: Diagnosis present

## 2020-09-09 DIAGNOSIS — Z66 Do not resuscitate: Secondary | ICD-10-CM | POA: Diagnosis present

## 2020-09-09 DIAGNOSIS — Z79899 Other long term (current) drug therapy: Secondary | ICD-10-CM

## 2020-09-09 DIAGNOSIS — K922 Gastrointestinal hemorrhage, unspecified: Secondary | ICD-10-CM | POA: Diagnosis not present

## 2020-09-09 DIAGNOSIS — Z950 Presence of cardiac pacemaker: Secondary | ICD-10-CM | POA: Insufficient documentation

## 2020-09-09 DIAGNOSIS — Z87891 Personal history of nicotine dependence: Secondary | ICD-10-CM

## 2020-09-09 DIAGNOSIS — S0990XA Unspecified injury of head, initial encounter: Secondary | ICD-10-CM | POA: Insufficient documentation

## 2020-09-09 DIAGNOSIS — Z20822 Contact with and (suspected) exposure to covid-19: Secondary | ICD-10-CM | POA: Diagnosis present

## 2020-09-09 DIAGNOSIS — I060 Rheumatic aortic stenosis: Secondary | ICD-10-CM | POA: Diagnosis present

## 2020-09-09 DIAGNOSIS — N181 Chronic kidney disease, stage 1: Secondary | ICD-10-CM | POA: Insufficient documentation

## 2020-09-09 DIAGNOSIS — M48061 Spinal stenosis, lumbar region without neurogenic claudication: Secondary | ICD-10-CM | POA: Diagnosis present

## 2020-09-09 DIAGNOSIS — Z881 Allergy status to other antibiotic agents status: Secondary | ICD-10-CM

## 2020-09-09 DIAGNOSIS — R319 Hematuria, unspecified: Secondary | ICD-10-CM | POA: Diagnosis present

## 2020-09-09 DIAGNOSIS — M25552 Pain in left hip: Secondary | ICD-10-CM | POA: Insufficient documentation

## 2020-09-09 DIAGNOSIS — K648 Other hemorrhoids: Secondary | ICD-10-CM | POA: Diagnosis present

## 2020-09-09 DIAGNOSIS — Z8673 Personal history of transient ischemic attack (TIA), and cerebral infarction without residual deficits: Secondary | ICD-10-CM | POA: Insufficient documentation

## 2020-09-09 DIAGNOSIS — F419 Anxiety disorder, unspecified: Secondary | ICD-10-CM | POA: Diagnosis present

## 2020-09-09 DIAGNOSIS — I5032 Chronic diastolic (congestive) heart failure: Secondary | ICD-10-CM | POA: Insufficient documentation

## 2020-09-09 DIAGNOSIS — W108XXA Fall (on) (from) other stairs and steps, initial encounter: Secondary | ICD-10-CM | POA: Insufficient documentation

## 2020-09-09 DIAGNOSIS — Z91040 Latex allergy status: Secondary | ICD-10-CM | POA: Insufficient documentation

## 2020-09-09 DIAGNOSIS — E7800 Pure hypercholesterolemia, unspecified: Secondary | ICD-10-CM | POA: Diagnosis present

## 2020-09-09 DIAGNOSIS — Z882 Allergy status to sulfonamides status: Secondary | ICD-10-CM

## 2020-09-09 DIAGNOSIS — Z885 Allergy status to narcotic agent status: Secondary | ICD-10-CM

## 2020-09-09 DIAGNOSIS — Z888 Allergy status to other drugs, medicaments and biological substances status: Secondary | ICD-10-CM

## 2020-09-09 DIAGNOSIS — Y92008 Other place in unspecified non-institutional (private) residence as the place of occurrence of the external cause: Secondary | ICD-10-CM

## 2020-09-09 DIAGNOSIS — Z85820 Personal history of malignant melanoma of skin: Secondary | ICD-10-CM

## 2020-09-09 DIAGNOSIS — Z7983 Long term (current) use of bisphosphonates: Secondary | ICD-10-CM

## 2020-09-09 DIAGNOSIS — N390 Urinary tract infection, site not specified: Secondary | ICD-10-CM | POA: Diagnosis present

## 2020-09-09 DIAGNOSIS — Z96653 Presence of artificial knee joint, bilateral: Secondary | ICD-10-CM | POA: Diagnosis present

## 2020-09-09 DIAGNOSIS — W010XXA Fall on same level from slipping, tripping and stumbling without subsequent striking against object, initial encounter: Secondary | ICD-10-CM | POA: Diagnosis present

## 2020-09-09 DIAGNOSIS — E785 Hyperlipidemia, unspecified: Secondary | ICD-10-CM | POA: Diagnosis present

## 2020-09-09 NOTE — ED Provider Notes (Signed)
[facility] Emergency Department Provider Note   ____________________________________________   Event Date/Time   First MD Initiated Contact with Patient 09/09/20 [phone]     (approximate)  I have reviewed the triage vital signs and the nursing notes.   HISTORY  Chief Complaint Fall   HPI [name] is a 74 y.o. female who reports she got her walker caught in some steps and fell backwards.  She landed on her butt and then hit her head.  She is on blood thinners so she came in for check.  Patient feels fine now.  She reports she had a previous pelvis and hip injury but that is healed.  She has minimal pain in her left hip now and can walk without difficulty.        Past Medical History:  Diagnosis Date  . Atrial fibrillation, permanent (HCC)    a. CHA2DS2VASc = 4-->coumadin;  b. 09/2014 Echo: EF 55-60%, normal wall motion, mild AI/AS, nl MV, mod dil LA, mildly dil RA, mild to mod TR, PASP .  . CHF (congestive heart failure) (HCC)   . Chronic kidney disease, stage I   . CVA (cerebral infarction)    a. 04/2015 - anticoagulation switched from eliquis to xarelto to coumadin.  [name] Hypertension   . Leukocytosis   . Pacemaker -Medtronic    a. implant for SSS  . PAD (peripheral artery disease) (HCC)    a. w/ left lower ext claudication s/p ABI's 04/2015 showing nl ABI on Right with abnl waveforms on left sugg of L SFA dzs.  [name] Restless leg syndrome   . 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    Patient Active Problem List   Diagnosis Date Noted  . Closed right hip fracture, initial encounter (HCC) 04/10/2019  . Degeneration of lumbar intervertebral disc 02/21/2019  . Lumbar radiculopathy 02/21/2019  . Spinal stenosis of lumbar region 02/21/2019  . Cellulitis 08/30/2018  . Closed fracture of distal end of left fibula 06/28/2018  . Acute respiratory failure with hypoxia  (HCC) 10/14/2016  . 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 Influenza A 10/14/2016  . Status post cardiac pacemaker procedure 10/14/2016  . Stroke (HCC) 10/14/2016  . ERRONEOUS ENCOUNTER--DISREGARD 07/03/2016  . Carotid bruit 12/22/2015  . Hyperlipidemia 10/23/2015  . Atrial fibrillation, permanent (HCC)   . Pacemaker -Medtronic   . 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 Chronic kidney disease, unspecified   . Encounter for anticoagulation discussion and counseling 06/25/2015  . Acute cardioembolic stroke (HCC) 06/25/2015  . PAD (peripheral artery disease) (HCC) 06/25/2015  . Pure hypercholesterolemia 04/30/2015  . History of recent stroke 04/28/2015  . Essential hypertension 12/09/2014  . Gait instability 09/08/2014  . Status post total bilateral knee replacement 06/24/2014  . UTI (urinary tract infection) 02/03/2014  . Fatigue 10/01/2013  . Shortness of breath 09/12/2012  . Stress disorder, acute 09/12/2012  . Tachycardia 09/12/2012  . Atrial fibrillation (HCC) 09/12/2012  . Melanoma in situ (HCC) 07/11/2012  . Morphea 07/11/2012  . Long term (current) use of anticoagulants 07/02/2012  . History of malignant melanoma of skin 05/30/2012  . CMC arthritis 02/02/2012  . Sprain of MCL (medial collateral ligament) of knee 01/24/2012  . Squamous cell carcinoma 12/21/2011  . Carpal tunnel syndrome of left wrist 11/28/2011  . Sinoatrial node dysfunction (HCC)   . Movement disorder 08/08/2011  . Edema 06/07/2011  . Chronic diastolic heart  failure (HCC) 05/27/2011  . Anxiety state 05/14/2011  . Esophageal reflux 05/14/2011  . Mitral valve replaced 05/14/2011  . Myalgia and myositis, unspecified 05/14/2011  . OSA (obstructive sleep apnea) 05/14/2011  . Osteoarthritis 05/14/2011  . Pulmonary hypertension (HCC) 05/14/2011  . Cardiac pacemaker in situ 10/03/2010    Past Surgical History:  Procedure Laterality Date  . bladder injection    . botox bladder    . HIP ARTHROPLASTY Right  04/13/2019   Procedure: ARTHROPLASTY BIPOLAR HIP (HEMIARTHROPLASTY);  Surgeon: Donato HeinzHooten, James P, MD;  Location: [facility] ORS;  Service: Orthopedics;  Laterality: Right;  . INSERT / REPLACE / REMOVE PACEMAKER     implant for SSS  . MELANOMA EXCISION     face  . MITRAL VALVE REPLACEMENT     s/p mechanical valve replaced with bovine valve 2010  . NASAL SINUS SURGERY    . PPM GENERATOR CHANGEOUT N/A 09/21/2018   Procedure: PPM GENERATOR CHANGEOUT;  Surgeon: Duke SalviaKlein, Steven C, MD;  [facility];  Service: Cardiovascular;  Laterality: N/A;    Prior to Admission medications   Medication Sig Start Date End Date Taking? Authorizing Provider  acetaminophen (TYLENOL) 650 MG CR tablet Take 650 mg by mouth every 8 (eight) hours as needed for pain.    [provider]  albuterol (PROVENTIL) (2.5 MG/3ML) 0.083% nebulizer solution Inhale into the lungs as needed.  07/18/19 07/29/20  [provider]  alendronate (FOSAMAX) 70 MG tablet Take 70 mg by mouth once a week.  08/19/19 09/09/20  [provider]  ascorbic acid (VITAMIN C) 500 MG tablet Take 500 mg by mouth daily.    [provider]  atorvastatin (LIPITOR) 80 MG tablet Take 80 mg by mouth every evening.     [provider]  Calcium Carb-Cholecalciferol (OYSTER SHELL CALCIUM) 500-400 MG-UNIT TABS Take by mouth daily.  07/18/19 09/09/20  [provider]  Cholecalciferol (VITAMIN D3) 1000 UNITS CAPS Take 2,000 Units by mouth every 30 (thirty) days.     [provider]  cyclobenzaprine (FLEXERIL) 5 MG tablet Take by mouth as needed.  07/18/19   [provider]  diazepam (VALIUM) 5 MG tablet Take 5 mg by mouth as needed.  07/18/19   [provider]  diclofenac Sodium (VOLTAREN) 1 % GEL Apply topically as needed.  09/17/18   [provider]  diltiazem (CARDIZEM CD) 120 MG 24 hr capsule Take 1 capsule (120 mg total) by mouth daily. 09/06/16   Antonieta IbaGollan, Timothy J, MD   docusate sodium (COLACE) 100 MG capsule Take 100 mg by mouth daily as needed for mild constipation.    [provider]  DULoxetine (CYMBALTA) 60 MG capsule Take 60 mg by mouth daily.  05/13/19   [provider]  ferrous sulfate 325 (65 FE) MG tablet TAKE 1 TABLET (325 MG TOTAL) BY MOUTH 2 (TWO) TIMES DAILY WITH A MEAL. 04/15/19   [provider]  FLUoxetine (PROZAC) 20 MG capsule Take 20 mg by mouth daily. 05/01/20   [provider]  fluticasone (FLONASE) 50 MCG/ACT nasal spray 2 sprays by Each Nare route daily as needed for allergies. 06/27/18   [provider]  furosemide (LASIX) 20 MG tablet Take 20 mg by mouth daily as needed (fluid retention/swelling.).     [provider]  gabapentin (NEURONTIN) 100 MG capsule Take 100 mg in the morning and 200 mg in the evening 04/30/19   [provider]  guaiFENesin (MUCINEX) 600 MG 12 hr  tablet Take by mouth as needed.     [provider]  ipratropium-albuterol (DUONEB) 0.5-2.5 (3) MG/3ML SOLN Inhale 3 mLs into the lungs as needed.     [provider]  magnesium oxide (MAG-OX) 400 MG tablet Take 400 mg by mouth daily.  07/30/18   [provider]  oxyCODONE (OXY IR/ROXICODONE) 5 MG immediate release tablet Take 5 mg by mouth as needed.  Patient not taking: Reported on 07/29/2020    [provider]  oxyCODONE-acetaminophen (PERCOCET/ROXICET) 5-325 MG tablet 1/4-1/2 tab QID prn severe leg pain. 07/23/20   [provider]  pantoprazole (PROTONIX) 40 MG tablet Take 40 mg by mouth daily.     [provider]  potassium chloride (KLOR-CON) 10 MEQ tablet Take 10 mEq by mouth as needed. With furosemide    [provider]  rOPINIRole (REQUIP) 2 MG tablet Take 2 mg by mouth at bedtime.  05/29/15   [provider]  senna (SENOKOT) 8.6 MG tablet Take 1 tablet by mouth as needed.  07/18/19   [provider]  warfarin (COUMADIN) 2 MG tablet  TAKE 1-2 TABS BY MOUTH EACH EVENING AS DIRECTED BY COUMADIN CLINIC. 03/31/20   [provider]  warfarin (COUMADIN) 3 MG tablet Take by mouth as directed. 3 mg M,W, F and 2 mg all other days    [provider]    Allergies Cephalexin, Latex, Morphine, Tizanidine, Liothyronine, Other, Statins, Tape, Tapentadol, and Sulfa antibiotics  Family History  Family history unknown: Yes    Social History Social History   Tobacco Use  . Smoking status: Former Smoker    Packs/day: 1.00    Years: 5.00    Pack years: 5.00    Types: Cigarettes    Quit date: 12/29/1970    Years since quitting: 49.7  . Smokeless tobacco: Never Used  Vaping Use  . Vaping Use: Never used  Substance Use Topics  . Alcohol use: No  . Drug use: No    Review of Systems  Constitutional: No fever/chills Eyes: No visual changes. ENT: No sore throat. Cardiovascular: Denies chest pain. Respiratory: Denies shortness of breath. Gastrointestinal: No abdominal pain.  No nausea, no vomiting.  No diarrhea.  No constipation. Genitourinary: Negative for dysuria. Musculoskeletal: Negative for back pain. Skin: Negative for rash. Neurological: Negative for headaches, focal weakness    ____________________________________________   PHYSICAL EXAM:  VITAL SIGNS: ED Triage Vitals  Enc Vitals Group     BP 09/09/20 1352 (!) 164/62     Pulse Rate 09/09/20 1352 64     Resp 09/09/20 1352 15     Temp 09/09/20 1352 98.2 F (36.8 C)     Temp Source 09/09/20 1352 Oral     SpO2 09/09/20 1352 100 %     Weight 09/09/20 1354 167 lb 15.9 oz (76.2 kg)     Height 09/09/20 1354 5\' 9"  (1.753 m)     Head Circumference --      Peak Flow --      Pain Score 09/09/20 1353 8     Pain Loc --      Pain Edu? --      Excl. in GC? --     Constitutional: Alert and oriented. Well appearing and in no acute distress. Eyes: Conjunctivae are normal.  Head: Atraumatic. Nose: No congestion/rhinnorhea. Mouth/Throat: Mucous  membranes are moist.  Oropharynx non-erythematous. Neck: No stridor Cardiovascular: Normal rate, regular rhythm. Grossly normal heart sounds.  Good peripheral circulation. Respiratory: Normal respiratory  effort.  No retractions. Lungs CTAB. Gastrointestinal: Soft and nontender. No distention. No abdominal bruits.  Musculoskeletal: There is some tenderness on palpation of the left hip only but good range of motion patient reports she can bear weight. Neurologic:  Normal speech and language. No gross focal neurologic deficits are appreciated. Skin:  Skin is warm, dry and intact. No rash noted.  ____________________________________________   LABS (all labs ordered are listed, but only abnormal results are displayed)  Labs Reviewed - No data to display ____________________________________________  EKG   ____________________________________________  RADIOLOGY Jill Poling, personally viewed and evaluated these images (plain radiographs) as part of my medical decision making, as well as reviewing the written report by the radiologist.  ED MD interpretation: There is an irregularity of the pubic ramus which may be old.  Patient does have a prior injury.  Radiology read the film I reviewed it.  CT of the head and neck were read as negative .  Pelvis CT was read as chronic injury.  Patient really is not tender there.  Official radiology report(s): DG Pelvis 1-2 Views  Result Date: 09/09/2020 CLINICAL DATA:  Larey Seat today. EXAM: PELVIS - 1-2 VIEW COMPARISON:  04/10/2019 FINDINGS: Interval right hip hemiarthroplasty in satisfactory position. Negative for acute fracture. Irregularity of the left inferior pubic ramus which was not present previously may represent a healing fracture. Limited views of this area. IMPRESSION: Right hip hemiarthroplasty. Irregularity left inferior pubic ramus may represent healing fracture. There is pain in this area, recommend further images of the left hip.  Electronically Signed   By: Marlan Palau M.D.   On: 09/09/2020 14:34   CT Head Wo Contrast  Result Date: 09/09/2020 CLINICAL DATA:  Status post fall.  Hit head.  On anticoagulation EXAM: CT HEAD WITHOUT CONTRAST CT CERVICAL SPINE WITHOUT CONTRAST TECHNIQUE: Multidetector CT imaging of the head and cervical spine was performed following the standard protocol without intravenous contrast. Multiplanar CT image reconstructions of the cervical spine were also generated. COMPARISON:  04/10/2019. FINDINGS: CT HEAD FINDINGS Brain: No evidence of acute infarction, hemorrhage, hydrocephalus, extra-axial collection or mass lesion/mass effect. There is mild diffuse low-attenuation within the subcortical and periventricular white matter compatible with chronic microvascular disease. Chronic infarcts within the right basal ganglia, left thalamus and right cerebellar hemisphere. Prominence of the sulci and ventricles are again noted compatible with brain atrophy. Vascular: No hyperdense vessel or unexpected calcification. Skull: Normal. Negative for fracture or focal lesion. Sinuses/Orbits: No acute finding. Other: None. CT CERVICAL SPINE FINDINGS Alignment: Normal Skull base and vertebrae: No acute fracture. No primary bone lesion or focal pathologic process. Soft tissues and spinal canal: No prevertebral fluid or swelling. No visible canal hematoma. Disc levels: Disc space narrowing and endplate spurring noted at C5-6 and C6-7. Multi level facet arthropathy is identified. Upper chest: Negative. Other: None IMPRESSION: 1. No acute intracranial abnormalities. 2. Chronic small vessel ischemic disease and brain atrophy. 3. No evidence for cervical spine fracture. 4. Cervical spondylosis. Electronically Signed   By: Signa Kell M.D.   On: 09/09/2020 15:55   CT Cervical Spine Wo Contrast  Result Date: 09/09/2020 CLINICAL DATA:  Status post fall.  Hit head.  On anticoagulation EXAM: CT HEAD WITHOUT CONTRAST CT CERVICAL  SPINE WITHOUT CONTRAST TECHNIQUE: Multidetector CT imaging of the head and cervical spine was performed following the standard protocol without intravenous contrast. Multiplanar CT image reconstructions of the cervical spine were also generated. COMPARISON:  04/10/2019. FINDINGS: CT HEAD FINDINGS Brain:  No evidence of acute infarction, hemorrhage, hydrocephalus, extra-axial collection or mass lesion/mass effect. There is mild diffuse low-attenuation within the subcortical and periventricular white matter compatible with chronic microvascular disease. Chronic infarcts within the right basal ganglia, left thalamus and right cerebellar hemisphere. Prominence of the sulci and ventricles are again noted compatible with brain atrophy. Vascular: No hyperdense vessel or unexpected calcification. Skull: Normal. Negative for fracture or focal lesion. Sinuses/Orbits: No acute finding. Other: None. CT CERVICAL SPINE FINDINGS Alignment: Normal Skull base and vertebrae: No acute fracture. No primary bone lesion or focal pathologic process. Soft tissues and spinal canal: No prevertebral fluid or swelling. No visible canal hematoma. Disc levels: Disc space narrowing and endplate spurring noted at C5-6 and C6-7. Multi level facet arthropathy is identified. Upper chest: Negative. Other: None IMPRESSION: 1. No acute intracranial abnormalities. 2. Chronic small vessel ischemic disease and brain atrophy. 3. No evidence for cervical spine fracture. 4. Cervical spondylosis. Electronically Signed   By: Signa Kell M.D.   On: 09/09/2020 15:55   CT PELVIS WO CONTRAST  Addendum Date: 09/09/2020   ADDENDUM REPORT: 09/09/2020 19:09 ADDENDUM: Probable fat necrosis and scarring within the left hip lateral soft tissues. Electronically Signed   By: Jasmine Pang M.D.   On: 09/09/2020 19:09   Result Date: 09/09/2020 CLINICAL DATA:  Pelvic trauma EXAM: CT PELVIS WITHOUT CONTRAST TECHNIQUE: Multidetector CT imaging of the pelvis was  performed following the standard protocol without intravenous contrast. COMPARISON:  Radiograph 09/09/2020, 04/13/2019 FINDINGS: Urinary Tract:  No abnormality visualized. Bowel:  Unremarkable visualized pelvic bowel loops. Vascular/Lymphatic: Moderate aortic atherosclerosis. No aneurysm. No suspicious nodes Reproductive:  Uterus and adnexa are unremarkable Other:  Negative for pelvic effusion Musculoskeletal: Right hip replacement with associated artifact. No acute displaced fracture or malalignment is seen. Chronic appearing fracture deformity involving left inferior pubic ramus. Suspected chronic fracture deformity of the upper sacrum best seen on sagittal views. IMPRESSION: 1. Right hip replacement without acute displaced fracture or malalignment. 2. Chronic appearing fracture deformity involving the left inferior pubic ramus. Suspected chronic fracture deformity of the upper sacrum. MRI evaluation may be obtained depending upon clinical course and or continued suspicion for acute osseous injury. Aortic Atherosclerosis (ICD10-I70.0). Electronically Signed: By: Jasmine Pang M.D. On: 09/09/2020 18:58    ____________________________________________   PROCEDURES  Procedure(s) performed (including Critical Care):  Procedures   ____________________________________________   INITIAL IMPRESSION / ASSESSMENT AND PLAN / ED COURSE  Patient with old injury to the pelvis.  Able to bear weight she does have some pain over the hip itself but this is mild and there appears to be some fat necrosis on the CT.  I believe the patient stable to go home.             ____________________________________________   FINAL CLINICAL IMPRESSION(S) / ED DIAGNOSES  Final diagnoses:  Fall, initial encounter     ED Discharge Orders    None      *Please note:  Rhonda Griffith was evaluated in Emergency Department on 09/09/2020 for the symptoms described in the history of present illness. She  was evaluated in the context of the global COVID-19 pandemic, which necessitated consideration that the patient might be at risk for infection with the SARS-CoV-2 virus that causes COVID-19. Institutional protocols and algorithms that pertain to the evaluation of patients at risk for COVID-19 are in a state of rapid change based on information released by regulatory bodies including the CDC and federal and state organizations. These policies  and algorithms were followed during the patient's care in the ED.  Some ED evaluations and interventions may be delayed as a result of limited staffing during and the pandemic.*   Note:  This document was prepared using Dragon voice recognition software and may include unintentional dictation errors.    Arnaldo Natal, MD 09/09/20 [phone]

## 2020-09-09 NOTE — ED Triage Notes (Signed)
Pt to ED via EMS with c/o mechanical fall. Pt states her walker got caught up in the steps and she fell backwards, pt states fell backwards and landed on her butt then hit her head x 2. Pt states is currently on blood thinners. Pt denies LOC, pt is currently A&O x4.

## 2020-09-09 NOTE — ED Notes (Signed)
Pt reports falling today and hitting her head.  No loc  No vomiting.  Pt reports neck and back pain with a h/a.  Pt states I feel sore all over my body.  Pt alert  Speech clear.  Pt in hallway bed.

## 2020-09-09 NOTE — ED Triage Notes (Signed)
Pt was seen today for a mechanical fall and discharged home. Pt noticed blood on her depends, in the toilet and on the toilet paper after wiping. Per EMS pt has bright red blood and small clots. Pt is unsure where bleeding came form but does not believe it to be urinary.

## 2020-09-09 NOTE — Discharge Instructions (Addendum)
The CT scan looked okay there was only a question of any reinjury to your old pelvis injury but that looks okay to now.  Please return for any further problems.  Please follow-up with your regular doctor later on the next week or 2.

## 2020-09-10 ENCOUNTER — Inpatient Hospital Stay
Admission: EM | Admit: 2020-09-10 | Discharge: 2020-09-12 | DRG: 378 | Disposition: A | Discharge status: Home / Self Care | Payer: Medicare Other | Attending: Internal Medicine | Admitting: Internal Medicine

## 2020-09-10 ENCOUNTER — Other Ambulatory Visit: Payer: Self-pay

## 2020-09-10 ENCOUNTER — Observation Stay: Payer: Medicare Other

## 2020-09-10 ENCOUNTER — Encounter: Payer: Self-pay | Admitting: *Deleted

## 2020-09-10 DIAGNOSIS — F411 Generalized anxiety disorder: Secondary | ICD-10-CM | POA: Diagnosis present

## 2020-09-10 DIAGNOSIS — N390 Urinary tract infection, site not specified: Secondary | ICD-10-CM

## 2020-09-10 DIAGNOSIS — K625 Hemorrhage of anus and rectum: Secondary | ICD-10-CM | POA: Diagnosis not present

## 2020-09-10 DIAGNOSIS — I10 Essential (primary) hypertension: Secondary | ICD-10-CM | POA: Diagnosis present

## 2020-09-10 DIAGNOSIS — K922 Gastrointestinal hemorrhage, unspecified: Secondary | ICD-10-CM

## 2020-09-10 DIAGNOSIS — E785 Hyperlipidemia, unspecified: Secondary | ICD-10-CM | POA: Diagnosis present

## 2020-09-10 DIAGNOSIS — Z952 Presence of prosthetic heart valve: Secondary | ICD-10-CM

## 2020-09-10 DIAGNOSIS — Z7901 Long term (current) use of anticoagulants: Secondary | ICD-10-CM

## 2020-09-10 DIAGNOSIS — I080 Rheumatic disorders of both mitral and aortic valves: Secondary | ICD-10-CM | POA: Diagnosis present

## 2020-09-10 DIAGNOSIS — I4891 Unspecified atrial fibrillation: Secondary | ICD-10-CM | POA: Diagnosis present

## 2020-09-10 DIAGNOSIS — I272 Pulmonary hypertension, unspecified: Secondary | ICD-10-CM | POA: Diagnosis present

## 2020-09-10 LAB — URINALYSIS, ROUTINE W REFLEX MICROSCOPIC
Bilirubin Urine: NEGATIVE
Glucose, UA: NEGATIVE mg/dL
Ketones, ur: NEGATIVE mg/dL
Nitrite: POSITIVE — AB
Protein, ur: NEGATIVE mg/dL
Specific Gravity, Urine: 1.016 (ref 1.005–1.030)
WBC, UA: >50 WBC/hpf — ABNORMAL HIGH (ref 0–5)
pH: 5 (ref 5.0–8.0)

## 2020-09-10 LAB — CBC WITH DIFFERENTIAL/PLATELET
Abs Immature Granulocytes: 0.04 10*3/uL (ref 0.00–0.07)
Basophils Absolute: 0 10*3/uL (ref 0.0–0.1)
Basophils Relative: 0 %
Eosinophils Absolute: 0.2 10*3/uL (ref 0.0–0.5)
Eosinophils Relative: 2 %
HCT: 38 % (ref 36.0–46.0)
Hemoglobin: 11.7 g/dL — ABNORMAL LOW (ref 12.0–15.0)
Immature Granulocytes: 0 %
Lymphocytes Relative: 15 %
Lymphs Abs: 1.4 10*3/uL (ref 0.7–4.0)
MCH: 30.8 pg (ref 26.0–34.0)
MCHC: 30.8 g/dL (ref 30.0–36.0)
MCV: 100 fL (ref 80.0–100.0)
Monocytes Absolute: 1 10*3/uL (ref 0.1–1.0)
Monocytes Relative: 10 %
Neutro Abs: 7 10*3/uL (ref 1.7–7.7)
Neutrophils Relative %: 73 %
Platelets: 224 10*3/uL (ref 150–400)
RBC: 3.8 MIL/uL — ABNORMAL LOW (ref 3.87–5.11)
RDW: 12.6 % (ref 11.5–15.5)
WBC: 9.7 10*3/uL (ref 4.0–10.5)
nRBC: 0 % (ref 0.0–0.2)

## 2020-09-10 LAB — COMPREHENSIVE METABOLIC PANEL
ALT: 15 U/L (ref 0–44)
AST: 19 U/L (ref 15–41)
Albumin: 4.1 g/dL (ref 3.5–5.0)
Alkaline Phosphatase: 64 U/L (ref 38–126)
Anion gap: 9 (ref 5–15)
BUN: 22 mg/dL (ref 8–23)
CO2: 25 mmol/L (ref 22–32)
Calcium: 10 mg/dL (ref 8.9–10.3)
Chloride: 107 mmol/L (ref 98–111)
Creatinine, Ser: 1.25 mg/dL — ABNORMAL HIGH (ref 0.44–1.00)
GFR, Estimated: 45 mL/min — ABNORMAL LOW (ref >60)
Glucose, Bld: 96 mg/dL (ref 70–99)
Potassium: 4.6 mmol/L (ref 3.5–5.1)
Sodium: 141 mmol/L (ref 135–145)
Total Bilirubin: 0.7 mg/dL (ref 0.3–1.2)
Total Protein: 6.9 g/dL (ref 6.5–8.1)

## 2020-09-10 LAB — BASIC METABOLIC PANEL
Anion gap: 7 (ref 5–15)
BUN: 21 mg/dL (ref 8–23)
CO2: 24 mmol/L (ref 22–32)
Calcium: 9.5 mg/dL (ref 8.9–10.3)
Chloride: 108 mmol/L (ref 98–111)
Creatinine, Ser: 1.08 mg/dL — ABNORMAL HIGH (ref 0.44–1.00)
GFR, Estimated: 53 mL/min — ABNORMAL LOW (ref >60)
Glucose, Bld: 89 mg/dL (ref 70–99)
Potassium: 4.1 mmol/L (ref 3.5–5.1)
Sodium: 139 mmol/L (ref 135–145)

## 2020-09-10 LAB — CBC
HCT: 33.1 % — ABNORMAL LOW (ref 36.0–46.0)
Hemoglobin: 10.3 g/dL — ABNORMAL LOW (ref 12.0–15.0)
MCH: 31 pg (ref 26.0–34.0)
MCHC: 31.1 g/dL (ref 30.0–36.0)
MCV: 99.7 fL (ref 80.0–100.0)
Platelets: 202 10*3/uL (ref 150–400)
RBC: 3.32 MIL/uL — ABNORMAL LOW (ref 3.87–5.11)
RDW: 12.6 % (ref 11.5–15.5)
WBC: 7.4 10*3/uL (ref 4.0–10.5)
nRBC: 0 % (ref 0.0–0.2)

## 2020-09-10 LAB — TROPONIN I (HIGH SENSITIVITY)
Troponin I (High Sensitivity): 38 ng/L — ABNORMAL HIGH (ref <18)
Troponin I (High Sensitivity): 38 ng/L — ABNORMAL HIGH (ref <18)

## 2020-09-10 LAB — PROTIME-INR
INR: 1.7 — ABNORMAL HIGH (ref 0.8–1.2)
Prothrombin Time: 19.5 seconds — ABNORMAL HIGH (ref 11.4–15.2)

## 2020-09-10 MED ORDER — GABAPENTIN 100 MG PO CAPS
100.0000 mg | ORAL_CAPSULE | Freq: Three times a day (TID) | ORAL | Status: DC
  Administered 2020-09-10 – 2020-09-12 (×5): 100 mg via ORAL
  Filled 2020-09-10 (×5): qty 1

## 2020-09-10 MED ORDER — ACETAMINOPHEN 325 MG PO TABS
ORAL_TABLET | ORAL | Status: AC
  Filled 2020-09-10: qty 2

## 2020-09-10 MED ORDER — ACETAMINOPHEN 650 MG RE SUPP
650.0000 mg | Freq: Four times a day (QID) | RECTAL | Status: DC | PRN
Start: 2020-09-10 — End: 2020-09-12

## 2020-09-10 MED ORDER — ROPINIROLE HCL 1 MG PO TABS
2.0000 mg | ORAL_TABLET | Freq: Every day | ORAL | Status: DC
  Administered 2020-09-10 – 2020-09-11 (×2): 2 mg via ORAL
  Filled 2020-09-10 (×2): qty 2

## 2020-09-10 MED ORDER — FLUOXETINE HCL 20 MG PO CAPS
20.0000 mg | ORAL_CAPSULE | Freq: Every day | ORAL | Status: DC
  Administered 2020-09-10 – 2020-09-12 (×3): 20 mg via ORAL
  Filled 2020-09-10 (×3): qty 1

## 2020-09-10 MED ORDER — TECHNETIUM TC 99M-LABELED RED BLOOD CELLS IV KIT
20.0000 | PACK | Freq: Once | INTRAVENOUS | Status: AC | PRN
  Administered 2020-09-10: 13:00:00 20.424 via INTRAVENOUS

## 2020-09-10 MED ORDER — FOSFOMYCIN TROMETHAMINE 3 G PO PACK
3.0000 g | PACK | Freq: Once | ORAL | Status: DC
  Filled 2020-09-10: qty 3

## 2020-09-10 MED ORDER — PANTOPRAZOLE SODIUM 40 MG IV SOLR
40.0000 mg | Freq: Two times a day (BID) | INTRAVENOUS | Status: DC
  Administered 2020-09-10 – 2020-09-11 (×3): 40 mg via INTRAVENOUS
  Filled 2020-09-10 (×4): qty 40

## 2020-09-10 MED ORDER — ONDANSETRON HCL 4 MG PO TABS: 4.0000 mg | ORAL_TABLET | Freq: Four times a day (QID) | ORAL | Status: DC | PRN

## 2020-09-10 MED ORDER — ACETAMINOPHEN 325 MG PO TABS
325.0000 mg | ORAL_TABLET | Freq: Four times a day (QID) | ORAL | Status: DC | PRN
  Administered 2020-09-10 (×2): 325 mg via ORAL
  Filled 2020-09-10 (×2): qty 1

## 2020-09-10 MED ORDER — POLYETHYLENE GLYCOL 3350 17 GM/SCOOP PO POWD
1.0000 | Freq: Once | ORAL | Status: AC
  Administered 2020-09-10: 18:00:00 255 g via ORAL
  Filled 2020-09-10: qty 255

## 2020-09-10 MED ORDER — ONDANSETRON HCL 4 MG/2ML IJ SOLN
4.0000 mg | Freq: Four times a day (QID) | INTRAMUSCULAR | Status: DC | PRN
  Administered 2020-09-11: 09:00:00 4 mg via INTRAVENOUS
  Filled 2020-09-10: qty 2

## 2020-09-10 MED ORDER — SODIUM CHLORIDE 0.9 % IV SOLN: INTRAVENOUS | Status: DC

## 2020-09-10 MED ORDER — ACETAMINOPHEN 325 MG PO TABS
650.0000 mg | ORAL_TABLET | Freq: Once | ORAL | Status: AC
  Administered 2020-09-10: 04:00:00 650 mg via ORAL

## 2020-09-10 MED ORDER — DIAZEPAM 5 MG PO TABS
5.0000 mg | ORAL_TABLET | Freq: Two times a day (BID) | ORAL | Status: DC | PRN
  Administered 2020-09-10: 18:00:00 5 mg via ORAL
  Filled 2020-09-10: qty 1

## 2020-09-10 MED ORDER — FERROUS SULFATE 325 (65 FE) MG PO TABS
325.0000 mg | ORAL_TABLET | Freq: Two times a day (BID) | ORAL | Status: DC
  Administered 2020-09-10 – 2020-09-12 (×4): 325 mg via ORAL
  Filled 2020-09-10 (×5): qty 1

## 2020-09-10 MED ORDER — DILTIAZEM HCL ER COATED BEADS 120 MG PO CP24
120.0000 mg | ORAL_CAPSULE | Freq: Every day | ORAL | Status: DC
  Administered 2020-09-10 – 2020-09-12 (×3): 120 mg via ORAL
  Filled 2020-09-10 (×4): qty 1

## 2020-09-10 NOTE — ED Notes (Signed)
RN called lab about add on troponin. Lab verified they had blood to run the test.

## 2020-09-10 NOTE — ED Notes (Signed)
Hospitalist at bedside for admitting assessment. Patient is alert and oriented x 4 at time of assessment.

## 2020-09-10 NOTE — H&P (Signed)
History and Physical   Rhonda Griffith ZOX:[id number] DOB: 07/09/1946 DOA: 09/10/2020  PCP: [name], Todd Deary, MD  Outpatient [facility]: Dr. Tresa MooreBehalal-Bock, [facility] rheumatologist [facility]) Patient coming from: home via EMS  I have personally briefly reviewed patient's old medical records in [facility] EMR.  Chief Concern: Bright red blood per rectum  HPI: Rhonda Griffith is a 74 y.o. female with medical history significant for mitral valve repair on warfarin, atrial fibrillation, anxiety, primary hypertension, history of chronic diastolic heart failure, presented to the emergency department for chief concerns of bright red blood per rectum while using the restroom.  Note: On 09/09/2020, patient was standing outside and tripper over uneven stones in her daughter's walkway she tripped and fell.  She presented to the emergency department for and CT the head without contrast and pelvis x-ray were ordered.  She was subsequently discharged as she was ambulating without difficulty in the emergency department.  She states she had blood in toilet when she went to urinate when she arrived home from the ED. She states she saw tiny clots in the toilet. She denies eating food that is red, including beets.  Review of systems: She denies dizziness, vision changes, chest pain, shortness of breath, abdominal pain, dysuria.  Denies dysphagia, odynophagia, fever, cough. She denies weakness in her legs, swelling in her legs.  ED Course: Discussed with ED provider, requesting admission for bright red blood per rectum, monitoring of hemoglobin hematocrit in setting of bright red blood per rectum and patient on chronic anticoagulation, warfarin, for mitral valve repair.  Review of Systems: As per HPI otherwise 10 point review of systems negative.  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 -No signs of active bleeding -Protonix 40 mg IV twice daily -Admit to observation with telemetry -A.m. provider to consult GI if indicated  History of mitral valve repair-on warfarin Atrial fibrillation  -Subtherapeutic INR -Has not resumed warfarin due to BRBPR -Continue telemetry -A.m. team to initiate monitoring INR via pharmacy once CBC is stable   Pyuria - no UTI symptoms, ED provider ordered one time dose of fosfomycin  Primary hypertension-controlled, resumed home diltiazem 120 mg daily  Restless leg syndrome-resumed home Requip nightly  Chart reviewed.   DVT prophylaxis: Holding pharmacological anticoagulation due to bright red blood per rectum, TED hose Code Status: DNR Diet: N.p.o. pending CBC Family Communication: she wants family called in the AM Disposition Plan: Pending clinical course Consults called: None at this time Admission status: Observation with telemetry  Past Medical History:  Diagnosis Date  . Atrial fibrillation, permanent (HCC)    a. CHA2DS2VASc = 4-->coumadin;  b. 09/2014 Echo: EF 55-60%, normal wall motion, mild AI/AS, nl MV, mod dil LA, mildly dil RA, mild to mod TR, PASP 47mmHg.  . CHF (congestive heart failure) (HCC)   . Chronic kidney disease, stage I   . CVA (cerebral infarction)    a. 04/2015 - anticoagulation switched from eliquis to xarelto to coumadin.  [name]. Hypertension   . Leukocytosis   . Pacemaker -Medtronic  a. implant for SSS  . PAD (peripheral artery disease) (HCC)    a. w/ left lower ext claudication s/p ABI's 04/2015 showing nl ABI on Right with abnl waveforms  on left sugg of L SFA dzs.  [name] Restless leg syndrome   . 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   Past Surgical History:  Procedure Laterality Date  . bladder injection    . botox bladder    . HIP ARTHROPLASTY Right 04/13/2019   Procedure: ARTHROPLASTY BIPOLAR HIP (HEMIARTHROPLASTY);  Surgeon: Donato Heinz, MD;  Location: [facility] ORS;  Service: Orthopedics;  Laterality: Right;  . INSERT / REPLACE / REMOVE PACEMAKER     implant for SSS  . MELANOMA EXCISION     face  . MITRAL VALVE REPLACEMENT     s/p mechanical valve replaced with bovine valve 2010  . NASAL SINUS SURGERY    . PPM GENERATOR CHANGEOUT N/A 09/21/2018   Procedure: PPM GENERATOR CHANGEOUT;  Surgeon: Duke Salvia, MD;  Location: [facility] INVASIVE CV LAB;  Service: Cardiovascular;  Laterality: N/A;   Social History:  reports that she quit smoking about 49 years ago. Her smoking use included cigarettes. She has a 5.00 pack-year smoking history. She has never used smokeless tobacco. She reports that she does not drink alcohol and does not use drugs.  Allergies  Allergen Reactions  . Cephalexin Rash  . Latex Rash  . Morphine Nausea And Vomiting  . Tizanidine Palpitations  . Liothyronine Other (See Comments)  . Other Other (See Comments)    Several different statins.  . Statins Other (See Comments)    Several different statins.  . Tape Other (See Comments)    Breaks her skin Allergic to plastic/latex tape. Only use paper tape on patient. Breaks her skin: Allergic to plastic/latex tape. Only use paper tape on patient.  . Tapentadol Other (See Comments)  . Sulfa Antibiotics Rash and Other (See Comments)   Family History  Family history unknown: Yes   Family history: Family history reviewed and not pertinent  Prior to Admission medications   Medication Sig Start Date End Date Taking? Authorizing Provider  Calcium Carb-Cholecalciferol (OYSTER SHELL  CALCIUM) 500-400 MG-UNIT TABS Take 1 tablet by mouth 2 (two) times daily. 07/18/19  Yes [provider]  acetaminophen (TYLENOL) 650 MG CR tablet Take 650 mg by mouth every 8 (eight) hours as needed for pain.    [provider]  albuterol (PROVENTIL) (2.5 MG/3ML) 0.083% nebulizer solution Inhale into the lungs as needed.  07/18/19   [provider]  alendronate (FOSAMAX) 70 MG tablet Take 70 mg by mouth once a week.  08/19/19   [provider]  ascorbic acid (VITAMIN C) 500 MG tablet Take 500 mg by mouth daily.    [provider]  atorvastatin (LIPITOR) 80 MG tablet Take 80 mg by mouth every evening.     [provider]  Cholecalciferol (VITAMIN D3) 1000 UNITS CAPS Take 2,000 Units by mouth every 30 (thirty) days.     [provider]  cyclobenzaprine (FLEXERIL) 5 MG tablet Take by mouth as needed.  07/18/19   [provider]  diazepam (VALIUM) 5 MG tablet Take 5 mg by mouth as needed.  07/18/19   [provider]  diclofenac Sodium (VOLTAREN) 1 % GEL Apply topically as needed.  09/17/18   [provider]  diltiazem (CARDIZEM CD) 120 MG 24 hr capsule Take  1 capsule (120 mg total) by mouth daily. 09/06/16   Antonieta Iba, MD  docusate sodium (COLACE) 100 MG capsule Take 100 mg by mouth daily as needed for mild constipation.    [provider]  DULoxetine (CYMBALTA) 60 MG capsule Take 60 mg by mouth daily.  05/13/19   [provider]  ferrous sulfate 325 (65 FE) MG tablet TAKE 1 TABLET (325 MG TOTAL) BY MOUTH 2 (TWO) TIMES DAILY WITH A MEAL. 04/15/19   [provider]  FLUoxetine (PROZAC) 20 MG capsule Take 20 mg by mouth daily. 05/01/20   [provider]  fluticasone (FLONASE) 50 MCG/ACT nasal spray 2 sprays by Each Nare route daily as needed for allergies. 06/27/18   [provider]  furosemide (LASIX) 20 MG tablet Take 20 mg by mouth daily as needed (fluid  retention/swelling.).     [provider]  gabapentin (NEURONTIN) 100 MG capsule Take 100 mg in the morning and 200 mg in the evening 04/30/19   [provider]  guaiFENesin (MUCINEX) 600 MG 12 hr tablet Take by mouth as needed.     [provider]  ipratropium-albuterol (DUONEB) 0.5-2.5 (3) MG/3ML SOLN Inhale 3 mLs into the lungs as needed.     [provider]  magnesium oxide (MAG-OX) 400 MG tablet Take 400 mg by mouth daily.  07/30/18   [provider]  oxyCODONE (OXY IR/ROXICODONE) 5 MG immediate release tablet Take 5 mg by mouth as needed.  Patient not taking: Reported on 07/29/2020    [provider]  oxyCODONE-acetaminophen (PERCOCET/ROXICET) 5-325 MG tablet 1/4-1/2 tab QID prn severe leg pain. 07/23/20   [provider]  pantoprazole (PROTONIX) 40 MG tablet Take 40 mg by mouth daily.     [provider]  potassium chloride (KLOR-CON) 10 MEQ tablet Take 10 mEq by mouth as needed. With furosemide    [provider]  rOPINIRole (REQUIP) 2 MG tablet Take 2 mg by mouth at bedtime.  05/29/15   [provider]  senna (SENOKOT) 8.6 MG tablet Take 1 tablet by mouth as needed.  07/18/19   [provider]  warfarin (COUMADIN) 2 MG tablet TAKE 1-2 TABS BY MOUTH EACH EVENING AS DIRECTED BY COUMADIN CLINIC. 03/31/20   [provider]  warfarin (COUMADIN) 3 MG tablet Take by mouth as directed. 3 mg M,W, F and 2 mg all other days    [provider]   Physical Exam: Vitals:   09/09/20 2334 09/10/20 0023 09/10/20 0247  BP: (!) 161/71  (!) 168/70  Pulse: 85  62  Resp: 16  15  Temp: 98.6 F (37 C)    TempSrc: Oral    SpO2: 98%  98%  Weight:  76.2 kg   Height:   (1.753 m)    Constitutional: appears age-appropriate, NAD, calm, comfortable Eyes: PERRL, lids and conjunctivae normal ENMT: Mucous membranes are moist. Posterior pharynx clear of any exudate or lesions. Age-appropriate  dentition. Hearing appropriate Neck: normal, supple, no masses, no thyromegaly Respiratory: clear to auscultation bilaterally, no wheezing, no crackles. Normal respiratory effort. No accessory muscle use.  Cardiovascular: Regular rate and rhythm, no murmurs / rubs / gallops. No extremity edema. 2+ pedal pulses. No carotid bruits.  Abdomen: no tenderness, no masses palpated, no hepatosplenomegaly. Bowel sounds positive.  Musculoskeletal: no clubbing / cyanosis. No joint deformity upper and lower extremities. Good ROM, no contractures, no atrophy. Normal muscle tone.  Skin: no rashes, lesions, ulcers. No induration Neurologic:  Sensation intact. Strength 5/5 in all 4.  Psychiatric: Normal judgment and insight. Alert and oriented x 3. Normal mood.   EKG: Independently reviewed, showing ventricular rate of 64, T wave inversion in leads I present on previous EKG, LVH, QTC 478  Imaging on Admission: Personally reviewed and I agree with radiologist reading as below.  DG Pelvis 1-2 Views  Result Date: 09/09/2020 CLINICAL DATA:  Larey Seat today. EXAM: PELVIS - 1-2 VIEW COMPARISON:  04/10/2019 FINDINGS: Interval right hip hemiarthroplasty in satisfactory position. Negative for acute fracture. Irregularity of the left inferior pubic ramus which was not present previously may represent a healing fracture. Limited views of this area. IMPRESSION: Right hip hemiarthroplasty. Irregularity left inferior pubic ramus may represent healing fracture. There is pain in this area, recommend further images of the left hip. Electronically Signed   By: Marlan Palau M.D.   On: 09/09/2020 14:34   CT Head Wo Contrast  Result Date: 09/09/2020 CLINICAL DATA:  Status post fall.  Hit head.  On anticoagulation EXAM: CT HEAD WITHOUT CONTRAST CT CERVICAL SPINE WITHOUT CONTRAST TECHNIQUE: Multidetector CT imaging of the head and cervical spine was performed following the standard protocol without intravenous contrast. Multiplanar CT  image reconstructions of the cervical spine were also generated. COMPARISON:  04/10/2019. FINDINGS: CT HEAD FINDINGS Brain: No evidence of acute infarction, hemorrhage, hydrocephalus, extra-axial collection or mass lesion/mass effect. There is mild diffuse low-attenuation within the subcortical and periventricular white matter compatible with chronic microvascular disease. Chronic infarcts within the right basal ganglia, left thalamus and right cerebellar hemisphere. Prominence of the sulci and ventricles are again noted compatible with brain atrophy. Vascular: No hyperdense vessel or unexpected calcification. Skull: Normal. Negative for fracture or focal lesion. Sinuses/Orbits: No acute finding. Other: None. CT CERVICAL SPINE FINDINGS Alignment: Normal Skull base and vertebrae: No acute fracture. No primary bone lesion or focal pathologic process. Soft tissues and spinal canal: No prevertebral fluid or swelling. No visible canal hematoma. Disc levels: Disc space narrowing and endplate spurring noted at C5-6 and C6-7. Multi level facet arthropathy is identified. Upper chest: Negative. Other: None IMPRESSION: 1. No acute intracranial abnormalities. 2. Chronic small vessel ischemic disease and brain atrophy. 3. No evidence for cervical spine fracture. 4. Cervical spondylosis. Electronically Signed   By: Signa Kell M.D.   On: 09/09/2020 15:55   CT Cervical Spine Wo Contrast  Result Date: 09/09/2020 CLINICAL DATA:  Status post fall.  Hit head.  On anticoagulation EXAM: CT HEAD WITHOUT CONTRAST CT CERVICAL SPINE WITHOUT CONTRAST TECHNIQUE: Multidetector CT imaging of the head and cervical spine was performed following the standard protocol without intravenous contrast. Multiplanar CT image reconstructions of the cervical spine were also generated. COMPARISON:  04/10/2019. FINDINGS: CT HEAD FINDINGS Brain: No evidence of acute infarction, hemorrhage, hydrocephalus, extra-axial collection or mass lesion/mass  effect. There is mild diffuse low-attenuation within the subcortical and periventricular white matter compatible with chronic microvascular disease. Chronic infarcts within the right basal ganglia, left thalamus and right cerebellar hemisphere. Prominence of the sulci and ventricles are again noted compatible with brain atrophy. Vascular: No hyperdense vessel or unexpected calcification. Skull: Normal. Negative for fracture or focal lesion. Sinuses/Orbits: No acute finding. Other: None. CT CERVICAL SPINE FINDINGS Alignment: Normal Skull base and vertebrae: No acute fracture. No primary bone lesion or focal pathologic process. Soft tissues and spinal canal: No prevertebral fluid or swelling. No visible canal hematoma. Disc levels: Disc space narrowing and endplate spurring noted at C5-6 and C6-7. Multi level facet arthropathy  is identified. Upper chest: Negative. Other: None IMPRESSION: 1. No acute intracranial abnormalities. 2. Chronic small vessel ischemic disease and brain atrophy. 3. No evidence for cervical spine fracture. 4. Cervical spondylosis. Electronically Signed   By: Signa Kell M.D.   On: 09/09/2020 15:55   CT PELVIS WO CONTRAST  Addendum Date: 09/09/2020   ADDENDUM REPORT: 09/09/2020 19:09 ADDENDUM: Probable fat necrosis and scarring within the left hip lateral soft tissues. Electronically Signed   By: Jasmine Pang M.D.   On: 09/09/2020 19:09   Result Date: 09/09/2020 CLINICAL DATA:  Pelvic trauma EXAM: CT PELVIS WITHOUT CONTRAST TECHNIQUE: Multidetector CT imaging of the pelvis was performed following the standard protocol without intravenous contrast. COMPARISON:  Radiograph 09/09/2020, 04/13/2019 FINDINGS: Urinary Tract:  No abnormality visualized. Bowel:  Unremarkable visualized pelvic bowel loops. Vascular/Lymphatic: Moderate aortic atherosclerosis. No aneurysm. No suspicious nodes Reproductive:  Uterus and adnexa are unremarkable Other:  Negative for pelvic effusion Musculoskeletal:  Right hip replacement with associated artifact. No acute displaced fracture or malalignment is seen. Chronic appearing fracture deformity involving left inferior pubic ramus. Suspected chronic fracture deformity of the upper sacrum best seen on sagittal views. IMPRESSION: 1. Right hip replacement without acute displaced fracture or malalignment. 2. Chronic appearing fracture deformity involving the left inferior pubic ramus. Suspected chronic fracture deformity of the upper sacrum. MRI evaluation may be obtained depending upon clinical course and or continued suspicion for acute osseous injury. Aortic Atherosclerosis (ICD10-I70.0). Electronically Signed: By: Jasmine Pang M.D. On: 09/09/2020 18:58   Labs on Admission: I have personally reviewed following labs  CBC: Recent Labs  Lab 09/10/20 0301  WBC 9.7  NEUTROABS 7.0  HGB 11.7*  HCT 38.0  MCV 100.0  PLT 224   Basic Metabolic Panel: Recent Labs  Lab 09/10/20 0301  NA 141  K 4.6  CL 107  CO2 25  GLUCOSE 96  BUN 22  CREATININE 1.25*  CALCIUM 10.0   GFR: Estimated Creatinine Clearance: 40 mL/min (A) (by C-G formula based on SCr of 1.25 mg/dL (H)). Liver Function Tests: Recent Labs  Lab 09/10/20 0301  AST 19  ALT 15  ALKPHOS 64  BILITOT 0.7  PROT 6.9  ALBUMIN 4.1   Coagulation Profile: Recent Labs  Lab 09/10/20 0301  INR 1.7*   Urine analysis:    Component Value Date/Time   COLORURINE YELLOW (A) 09/10/2020 0339   APPEARANCEUR HAZY (A) 09/10/2020 0339   LABSPEC 1.016 09/10/2020 0339   PHURINE 5.0 09/10/2020 0339   GLUCOSEU NEGATIVE 09/10/2020 0339   HGBUR SMALL (A) 09/10/2020 0339   BILIRUBINUR NEGATIVE 09/10/2020 0339   KETONESUR NEGATIVE 09/10/2020 0339   PROTEINUR NEGATIVE 09/10/2020 0339   NITRITE POSITIVE (A) 09/10/2020 0339   LEUKOCYTESUR SMALL (A) 09/10/2020 0339   Rhonda Griffith D.O. [facility]  If 12AM-7AM, please contact overnight-coverage provider If 7AM-7PM, please contact day coverage  provider [email or web address]  09/10/2020, 5:37 AM

## 2020-09-10 NOTE — ED Notes (Signed)
Daughter updated with permission from pt.

## 2020-09-10 NOTE — ED Notes (Signed)
Pt reporting she is feeling generalized neck pain and muscle aches from her fall today. Pt requesting tylenol. MD made aware.

## 2020-09-10 NOTE — Progress Notes (Signed)
Patient ID: Rhonda Griffith, female   DOB: 1945-10-15, 74 y.o.   MRN: [id number]  NO charge for today  Patient admitted early hours of the morning with rectal bleed. She was seen in the emergency room yesterday after having mechanical fall workup with CT scans were negative for any fracture or bleed. She was sent home came in back with bright red blood per rectum couple episodes at home.  BP soft and fluctuating. Start IVF. hgb stable at 10.3 BRBPR at 9;45 am D/w GI dr Iva Boop get NM bleeding scan Hold coumadin-the patient has MVR and has tissue valve per dter. Follows with Dr Mariah Milling for her cardiac issues  Spoke with dter Lupita Leash  Pt is DNR (priror to admission)

## 2020-09-10 NOTE — ED Notes (Addendum)
Pt assisted to toilet, 2 person assist, unsteady gait. Bright red blood noted in toilet. Will notify MD

## 2020-09-10 NOTE — Consult Note (Addendum)
Midge Minium, MD [facility]  [address], [address] [place], [place] [address] Phone: [phone] Fax : [phone]  Consultation  Referring Provider:     Dr. Allena Katz Primary Care Physician:  [name], MD Primary Gastroenterologist:  Gentry Fitz         Reason for Consultation:     Rectal bleeding  Date of Admission:  09/10/2020 Date of Consultation:  09/10/2020         HPI:   Rhonda Griffith is a 74 y.o. female Who reports that she has never seen a Gastroenterologist in the past and comes in today with rectal bleeding. The patient is aware of where she is the year and the month but does not recall the events that brought her in.  She does think that she was in the hospital yesterday after a fall and that was confirmed by a chart review.  The patient was then sent home and then was reported to have rectal bleeding today and was brought back to the hospital.  The patient has a history of a mitral valve replacement/repair she believes that is why she is on anticoagulation. There was a report that there was blood in the toilet when she went to urinate with some clots.  She denies ever having rectal bleeding in the past. The patient's most recent hemoglobin and hematocrit have shown:  Component     Latest Ref Rng & Units 04/27/2020 09/10/2020 09/10/2020          3:01 AM  5:14 AM  Hemoglobin     12.0 - 15.0 g/dL 69.6 29.5 (L) 28.4 (L)  HCT     36.0 - 46.0 % 39.9 38.0 33.1 (L)    The patient denies any abdominal pain nausea vomiting fevers or chills.  She also denies any back stools or hematemesis.  Past Medical History:  Diagnosis Date  . Atrial fibrillation, permanent (HCC)    a. CHA2DS2VASc = 4-->coumadin;  b. 09/2014 Echo: EF 55-60%, normal wall motion, mild AI/AS, nl MV, mod dil LA, mildly dil RA, mild to mod TR, PASP .  . CHF (congestive heart failure) (HCC)   . Chronic kidney disease, stage I   . CVA (cerebral infarction)    a. 04/2015 -  anticoagulation switched from eliquis to xarelto to coumadin.  [name] Hypertension   . Leukocytosis   . Pacemaker -Medtronic    a. implant for SSS  . PAD (peripheral artery disease) (HCC)    a. w/ left lower ext claudication s/p ABI's 04/2015 showing nl ABI on Right with abnl waveforms on left sugg of L SFA dzs.  [name] Restless leg syndrome   . 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    Past Surgical History:  Procedure Laterality Date  . bladder injection    . botox bladder    . HIP ARTHROPLASTY Right 04/13/2019   Procedure: ARTHROPLASTY BIPOLAR HIP (HEMIARTHROPLASTY);  Surgeon: Donato Heinz, MD;  Location: [facility] ORS;  Service: Orthopedics;  Laterality: Right;  . INSERT / REPLACE / REMOVE PACEMAKER     implant for SSS  . MELANOMA EXCISION     face  . MITRAL VALVE REPLACEMENT     s/p mechanical valve replaced with bovine valve 2010  . NASAL SINUS SURGERY    . PPM GENERATOR CHANGEOUT N/A 09/21/2018   Procedure: PPM GENERATOR CHANGEOUT;  Surgeon: Duke Salvia, MD;  Location: [facility] INVASIVE CV LAB;  Service: Cardiovascular;  Laterality: N/A;    Prior to Admission medications   Medication Sig Start Date End Date Taking? Authorizing Provider  acetaminophen (TYLENOL) 650 MG CR tablet Take 650 mg by mouth every 8 (eight) hours as needed for pain.   Yes [provider]  alendronate (FOSAMAX) 70 MG tablet Take 70 mg by mouth once a week.  08/19/19  Yes [provider]  ascorbic acid (VITAMIN C) 500 MG tablet Take 500 mg by mouth daily.   Yes [provider]  atorvastatin (LIPITOR) 80 MG tablet Take 80 mg by mouth every evening.    Yes [provider]  Calcium Carb-Cholecalciferol (OYSTER SHELL CALCIUM) 500-400 MG-UNIT TABS Take 1 tablet by mouth 2 (two) times daily. 07/18/19  Yes [provider]  Cholecalciferol (VITAMIN D3) 1000 UNITS CAPS Take 2,000 Units by mouth every 30 (thirty)  days.    Yes [provider]  clobetasol cream (TEMOVATE) 0.05 % Apply 1 application topically 2 (two) times daily.   Yes [provider]  cyclobenzaprine (FLEXERIL) 5 MG tablet Take 5 mg by mouth 2 (two) times daily as needed for muscle spasms. 07/18/19  Yes [provider]  diltiazem (CARDIZEM CD) 120 MG 24 hr capsule Take 1 capsule (120 mg total) by mouth daily. 09/06/16  Yes Antonieta Iba, MD  docusate sodium (COLACE) 100 MG capsule Take 100 mg by mouth daily as needed for mild constipation.   Yes [provider]  DULoxetine (CYMBALTA) 60 MG capsule Take 60 mg by mouth daily.  05/13/19  Yes [provider]  FLUoxetine (PROZAC) 20 MG capsule Take 20 mg by mouth daily. 05/01/20  Yes [provider]  fluticasone (FLONASE) 50 MCG/ACT nasal spray 2 sprays by Each Nare route daily as needed for allergies. 06/27/18  Yes [provider]  gabapentin (NEURONTIN) 100 MG capsule Take 100 mg by mouth 3 (three) times daily. 04/30/19  Yes [provider]  guaiFENesin (MUCINEX) 600 MG 12 hr tablet Take 600 mg by mouth 2 (two) times daily as needed for cough or to loosen phlegm.   Yes [provider]  magnesium oxide (MAG-OX) 400 MG tablet Take 400 mg by mouth daily.  07/30/18  Yes [provider]  pantoprazole (PROTONIX) 40 MG tablet Take 40 mg by mouth daily.    Yes [provider]  rOPINIRole (REQUIP) 2 MG tablet Take 2 mg by mouth at bedtime.  05/29/15  Yes [provider]  senna (SENOKOT) 8.6 MG tablet Take 1 tablet by mouth as needed for constipation. 07/18/19  Yes [provider]  warfarin (COUMADIN) 2.5 MG tablet Take 2.5 mg by mouth as directed. Take on Monday, Wednesday, Thursday, Friday, Sunday   Yes [provider]  warfarin (COUMADIN) 3 MG tablet Take 3 mg by mouth as directed. Take on Tuesday and Saturday   Yes [provider]    Family History  Family history unknown:  Yes     Social History   Tobacco Use  . Smoking status: Former Smoker    Packs/day: 1.00    Years: 5.00    Pack years: 5.00    Types: Cigarettes    Quit date: 12/29/1970    Years since quitting: 49.7  . Smokeless tobacco: Never Used  Vaping Use  . Vaping Use: Never used  Substance Use Topics  . Alcohol use: No  . Drug use: No    Allergies as of 09/09/2020 - Review Complete 09/09/2020  Allergen Reaction  Noted  . Cephalexin Rash 06/15/2012  . Latex Rash 05/26/2015  . Morphine Nausea And Vomiting 06/08/2010  . Tizanidine Palpitations 04/08/2015  . Liothyronine Other (See Comments) 06/20/2019  . Other Other (See Comments) 05/13/2013  . Statins Other (See Comments) 05/13/2013  . Tape Other (See Comments) 10/14/2016  . Tapentadol Other (See Comments)   . Sulfa antibiotics Rash and Other (See Comments) 06/24/2014    Review of Systems:    All systems reviewed and negative except where noted in HPI.   Physical Exam:  Vital signs in last 24 hours: Temp:  [98.6 F (37 C)] 98.6 F (37 C) (12/15 2334) Pulse Rate:  [59-85] 80 (12/16 1505) Resp:  [15-24] 23 (12/16 1505) BP: (109-168)/(46-83) 121/71 (12/16 1505) SpO2:  [95 %-100 %] 100 % (12/16 1505) Weight:  [76.2 kg] 76.2 kg (12/16 0023)   General:   Pleasant, cooperative in NAD Head:  Normocephalic and atraumatic. Eyes:   No icterus.   Conjunctiva pink. PERRLA. Ears:  Normal auditory acuity. Neck:  Supple; no masses or thyroidomegaly Lungs: Respirations even and unlabored. Lungs clear to auscultation bilaterally.   No wheezes, crackles, or rhonchi.  Heart:  Regular rate and rhythm;  Without murmur, clicks, rubs or gallops Abdomen:  Soft, nondistended, nontender. Normal bowel sounds. No appreciable masses or hepatomegaly.  No rebound or guarding.  Rectal:  Not performed. Msk:  Symmetrical without gross deformities.   Extremities:  Without edema, cyanosis or clubbing. Neurologic:  Alert and oriented x3;  grossly normal  neurologically. Skin:  Intact without significant lesions or rashes. Cervical Nodes:  No significant cervical adenopathy. Psych:  Alert and cooperative. Normal affect.  LAB RESULTS: Recent Labs    09/10/20 0301 09/10/20 0514  WBC 9.7 7.4  HGB 11.7* 10.3*  HCT 38.0 33.1*  PLT 224 202   BMET Recent Labs    09/10/20 0301 09/10/20 0514  NA 141 139  K 4.6 4.1  CL 107 108  CO2 25 24  GLUCOSE 96 89  BUN 22 21  CREATININE 1.25* 1.08*  CALCIUM 10.0 9.5   LFT Recent Labs    09/10/20 0301  PROT 6.9  ALBUMIN 4.1  AST 19  ALT 15  ALKPHOS 64  BILITOT 0.7   PT/INR Recent Labs    09/10/20 0301  LABPROT 19.5*  INR 1.7*    STUDIES: DG Pelvis 1-2 Views  Result Date: 09/09/2020 CLINICAL DATA:  Larey Seat today. EXAM: PELVIS - 1-2 VIEW COMPARISON:  04/10/2019 FINDINGS: Interval right hip hemiarthroplasty in satisfactory position. Negative for acute fracture. Irregularity of the left inferior pubic ramus which was not present previously may represent a healing fracture. Limited views of this area. IMPRESSION: Right hip hemiarthroplasty. Irregularity left inferior pubic ramus may represent healing fracture. There is pain in this area, recommend further images of the left hip. Electronically Signed   By: Marlan Palau M.D.   On: 09/09/2020 14:34   CT Head Wo Contrast  Result Date: 09/09/2020 CLINICAL DATA:  Status post fall.  Hit head.  On anticoagulation EXAM: CT HEAD WITHOUT CONTRAST CT CERVICAL SPINE WITHOUT CONTRAST TECHNIQUE: Multidetector CT imaging of the head and cervical spine was performed following the standard protocol without intravenous contrast. Multiplanar CT image reconstructions of the cervical spine were also generated. COMPARISON:  04/10/2019. FINDINGS: CT HEAD FINDINGS Brain: No evidence of acute infarction, hemorrhage, hydrocephalus, extra-axial collection or mass lesion/mass effect. There is mild diffuse low-attenuation within the subcortical and periventricular  white matter compatible with chronic microvascular disease. Chronic  infarcts within the right basal ganglia, left thalamus and right cerebellar hemisphere. Prominence of the sulci and ventricles are again noted compatible with brain atrophy. Vascular: No hyperdense vessel or unexpected calcification. Skull: Normal. Negative for fracture or focal lesion. Sinuses/Orbits: No acute finding. Other: None. CT CERVICAL SPINE FINDINGS Alignment: Normal Skull base and vertebrae: No acute fracture. No primary bone lesion or focal pathologic process. Soft tissues and spinal canal: No prevertebral fluid or swelling. No visible canal hematoma. Disc levels: Disc space narrowing and endplate spurring noted at C5-6 and C6-7. Multi level facet arthropathy is identified. Upper chest: Negative. Other: None IMPRESSION: 1. No acute intracranial abnormalities. 2. Chronic small vessel ischemic disease and brain atrophy. 3. No evidence for cervical spine fracture. 4. Cervical spondylosis. Electronically Signed   By: Signa Kell M.D.   On: 09/09/2020 15:55   CT Cervical Spine Wo Contrast  Result Date: 09/09/2020 CLINICAL DATA:  Status post fall.  Hit head.  On anticoagulation EXAM: CT HEAD WITHOUT CONTRAST CT CERVICAL SPINE WITHOUT CONTRAST TECHNIQUE: Multidetector CT imaging of the head and cervical spine was performed following the standard protocol without intravenous contrast. Multiplanar CT image reconstructions of the cervical spine were also generated. COMPARISON:  04/10/2019. FINDINGS: CT HEAD FINDINGS Brain: No evidence of acute infarction, hemorrhage, hydrocephalus, extra-axial collection or mass lesion/mass effect. There is mild diffuse low-attenuation within the subcortical and periventricular white matter compatible with chronic microvascular disease. Chronic infarcts within the right basal ganglia, left thalamus and right cerebellar hemisphere. Prominence of the sulci and ventricles are again noted compatible with brain  atrophy. Vascular: No hyperdense vessel or unexpected calcification. Skull: Normal. Negative for fracture or focal lesion. Sinuses/Orbits: No acute finding. Other: None. CT CERVICAL SPINE FINDINGS Alignment: Normal Skull base and vertebrae: No acute fracture. No primary bone lesion or focal pathologic process. Soft tissues and spinal canal: No prevertebral fluid or swelling. No visible canal hematoma. Disc levels: Disc space narrowing and endplate spurring noted at C5-6 and C6-7. Multi level facet arthropathy is identified. Upper chest: Negative. Other: None IMPRESSION: 1. No acute intracranial abnormalities. 2. Chronic small vessel ischemic disease and brain atrophy. 3. No evidence for cervical spine fracture. 4. Cervical spondylosis. Electronically Signed   By: Signa Kell M.D.   On: 09/09/2020 15:55   CT PELVIS WO CONTRAST  Addendum Date: 09/09/2020   ADDENDUM REPORT: 09/09/2020 19:09 ADDENDUM: Probable fat necrosis and scarring within the left hip lateral soft tissues. Electronically Signed   By: Jasmine Pang M.D.   On: 09/09/2020 19:09   Result Date: 09/09/2020 CLINICAL DATA:  Pelvic trauma EXAM: CT PELVIS WITHOUT CONTRAST TECHNIQUE: Multidetector CT imaging of the pelvis was performed following the standard protocol without intravenous contrast. COMPARISON:  Radiograph 09/09/2020, 04/13/2019 FINDINGS: Urinary Tract:  No abnormality visualized. Bowel:  Unremarkable visualized pelvic bowel loops. Vascular/Lymphatic: Moderate aortic atherosclerosis. No aneurysm. No suspicious nodes Reproductive:  Uterus and adnexa are unremarkable Other:  Negative for pelvic effusion Musculoskeletal: Right hip replacement with associated artifact. No acute displaced fracture or malalignment is seen. Chronic appearing fracture deformity involving left inferior pubic ramus. Suspected chronic fracture deformity of the upper sacrum best seen on sagittal views. IMPRESSION: 1. Right hip replacement without acute displaced  fracture or malalignment. 2. Chronic appearing fracture deformity involving the left inferior pubic ramus. Suspected chronic fracture deformity of the upper sacrum. MRI evaluation may be obtained depending upon clinical course and or continued suspicion for acute osseous injury. Aortic Atherosclerosis (ICD10-I70.0). Electronically Signed: By: Jasmine Pang  M.D. On: 09/09/2020 18:58   NM GI Blood Loss  Result Date: 09/10/2020 CLINICAL DATA:  Right red blood per rectum last night in this morning, on anticoagulation with warfarin. EXAM: NUCLEAR MEDICINE GASTROINTESTINAL BLEEDING SCAN TECHNIQUE: Sequential abdominal images were obtained following intravenous administration of Tc-2358m labeled red blood cells. RADIOPHARMACEUTICALS:  20.4 mCi Tc-4658m pertechnetate in-vitro labeled red cells. COMPARISON:  09/10/2015 CT pelvis. FINDINGS: No scintigraphic evidence of active GI bleed in the abdomen or pelvis over 2 hours of imaging. Focal radiotracer accumulation in the perineum is favored represent excreted urine accumulating within patient diaper. IMPRESSION: No scintigraphic evidence of active GI bleed. Electronically Signed   By: Delbert PhenixJason A Poff M.D.   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 y/o female with Who comes in with rectal bleeding after being seen in the hospital the previous day for a fall.  The patient is on anticoagulation at this time for Mitral valve surgery.   Plan:  The patient has a lower GI bleed and states she's never had a colonoscopy.  The patient could have bleeding from hemorrhoids versus diverticulosis versus  malignancy and less likely an upper GI bleed.  The patient underwent a bleeding scan today which was negative for any active bleeding. The patient will be prepped tonight for the colonoscopy be done tomorrow.  The patient has been explained the plan and agrees with it.  Thank you for involving me in the care of this patient.      LOS: 0 days   Midge Miniumarren Arrington Yohe, MD, [facility] 09/10/2020, 4:57 PM,  Pager [phone] 7am-5pm  Check AMION for 5pm -7am coverage and on weekends   Note: This dictation was prepared with Dragon dictation along with smaller phrase technology. Any transcriptional errors that result from this process are unintentional.

## 2020-09-10 NOTE — ED Provider Notes (Signed)
[facility] Emergency Department Provider Note  ____________________________________________   Event Date/Time   First MD Initiated Contact with Patient 09/10/20 [phone]     (approximate)  I have reviewed the triage vital signs and the nursing notes.   HISTORY  Chief Complaint Fall    HPI Rhonda Griffith is a 74 y.o. female who was evaluated earlier today for a fall and not found to have any acute injuries who presents tonight by EMS for evaluation of acute onset rectal bleeding.  The patient is on warfarin and has been for years due to a mitral valve replacement.  The patient was evaluated earlier tonight with imaging and not found to have any acute injury so she went home.  After going home she went to the bathroom and noticed that she had blood in her depends.  She also had what she describes as a large amount of bright red blood with some clots when she had a bowel movement.  She does not believe she is having any blood in her urine.  She has never had GI bleeding in the past.  She has no abdominal pain currently and no cramping and no increased rectal pressure or urge to have a bowel movement.  However the presence of the blood was concerning and EMS was called.  She has not had any weakness nor dizziness.  Nothing particular made the symptoms better or worse, it was acute in onset and she describes it as severe.  She denies fever, sore throat, chest pain, shortness of breath, nausea, vomiting, and abdominal pain.  She is still having some muscle soreness and aching after her fall most notable in her right and left shoulder but without any new injuries.         Past Medical History:  Diagnosis Date  . Atrial fibrillation, permanent (HCC)    a. CHA2DS2VASc = 4-->coumadin;  b. 09/2014 Echo: EF 55-60%, normal wall motion, mild AI/AS, nl MV, mod dil LA, mildly dil RA, mild to mod TR, PASP .  . CHF (congestive heart failure) (HCC)   . Chronic  kidney disease, stage I   . CVA (cerebral infarction)    a. 04/2015 - anticoagulation switched from eliquis to xarelto to coumadin.  [name] Hypertension   . Leukocytosis   . Pacemaker -Medtronic    a. implant for SSS  . PAD (peripheral artery disease) (HCC)    a. w/ left lower ext claudication s/p ABI's 04/2015 showing nl ABI on Right with abnl waveforms on left sugg of L SFA dzs.  [name] Restless leg syndrome   . 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    Patient Active Problem List   Diagnosis Date Noted  . BRBPR (bright red blood per rectum) 09/10/2020  . Closed right hip fracture, initial encounter (HCC) 04/10/2019  . Degeneration of lumbar intervertebral disc 02/21/2019  . Lumbar radiculopathy 02/21/2019  . Spinal stenosis of lumbar region 02/21/2019  . Cellulitis 08/30/2018  . Closed fracture of distal end of left fibula 06/28/2018  . Acute respiratory failure with hypoxia (HCC) 10/14/2016  . 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 Influenza A 10/14/2016  . Status post cardiac pacemaker procedure 10/14/2016  . Stroke (HCC) 10/14/2016  . ERRONEOUS ENCOUNTER--DISREGARD 07/03/2016  . Carotid bruit 12/22/2015  . Hyperlipidemia 10/23/2015  . Atrial fibrillation, permanent (HCC)   . Pacemaker -Medtronic   . 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  Chronic kidney disease, unspecified   . Encounter for anticoagulation discussion and counseling 06/25/2015  . Acute cardioembolic stroke (HCC) 06/25/2015  . PAD (peripheral artery disease) (HCC) 06/25/2015  . Pure hypercholesterolemia 04/30/2015  . History of recent stroke 04/28/2015  . Essential hypertension 12/09/2014  . Gait instability 09/08/2014  . Status post total bilateral knee replacement 06/24/2014  . UTI (urinary tract infection) 02/03/2014  . Fatigue 10/01/2013  . Shortness of breath 09/12/2012  . Stress disorder, acute 09/12/2012  . Tachycardia 09/12/2012  .  Atrial fibrillation (HCC) 09/12/2012  . Melanoma in situ (HCC) 07/11/2012  . Morphea 07/11/2012  . Long term (current) use of anticoagulants 07/02/2012  . History of malignant melanoma of skin 05/30/2012  . CMC arthritis 02/02/2012  . Sprain of MCL (medial collateral ligament) of knee 01/24/2012  . Squamous cell carcinoma 12/21/2011  . Carpal tunnel syndrome of left wrist 11/28/2011  . Sinoatrial node dysfunction (HCC)   . Movement disorder 08/08/2011  . Edema 06/07/2011  . Chronic diastolic heart failure (HCC) 05/27/2011  . Anxiety state 05/14/2011  . Esophageal reflux 05/14/2011  . Mitral valve replaced 05/14/2011  . Myalgia and myositis, unspecified 05/14/2011  . OSA (obstructive sleep apnea) 05/14/2011  . Osteoarthritis 05/14/2011  . Pulmonary hypertension (HCC) 05/14/2011  . Cardiac pacemaker in situ 10/03/2010    Past Surgical History:  Procedure Laterality Date  . bladder injection    . botox bladder    . HIP ARTHROPLASTY Right 04/13/2019   Procedure: ARTHROPLASTY BIPOLAR HIP (HEMIARTHROPLASTY);  Surgeon: Donato Heinz, MD;  Location: [facility] ORS;  Service: Orthopedics;  Laterality: Right;  . INSERT / REPLACE / REMOVE PACEMAKER     implant for SSS  . MELANOMA EXCISION     face  . MITRAL VALVE REPLACEMENT     s/p mechanical valve replaced with bovine valve 2010  . NASAL SINUS SURGERY    . PPM GENERATOR CHANGEOUT N/A 09/21/2018   Procedure: PPM GENERATOR CHANGEOUT;  Surgeon: Duke Salvia, MD;  Location: [facility] INVASIVE CV LAB;  Service: Cardiovascular;  Laterality: N/A;    Prior to Admission medications   Medication Sig Start Date End Date Taking? Authorizing Provider  diltiazem (CARDIZEM CD) 120 MG 24 hr capsule Take 1 capsule (120 mg total) by mouth daily. 09/06/16  Yes Gollan, Tollie Pizza, MD  pantoprazole (PROTONIX) 40 MG tablet Take 40 mg by mouth daily.    Yes [provider]  warfarin (COUMADIN) 2.5 MG tablet Take 2.5 mg by mouth as directed. Take on  Monday, Wednesday, Thursday, Friday, Sunday   Yes [provider]  warfarin (COUMADIN) 3 MG tablet Take 3 mg by mouth as directed. Take on Tuesday and Saturday   Yes [provider]  acetaminophen (TYLENOL) 650 MG CR tablet Take 650 mg by mouth every 8 (eight) hours as needed for pain.    [provider]  albuterol (PROVENTIL) (2.5 MG/3ML) 0.083% nebulizer solution Inhale into the lungs as needed.  07/18/19   [provider]  alendronate (FOSAMAX) 70 MG tablet Take 70 mg by mouth once a week.  08/19/19   [provider]  ascorbic acid (VITAMIN C) 500 MG tablet Take 500 mg by mouth daily.    [provider]  atorvastatin (LIPITOR) 80 MG tablet Take 80 mg by mouth every evening.     [provider]  Calcium Carb-Cholecalciferol (OYSTER SHELL CALCIUM) 500-400 MG-UNIT TABS Take 1 tablet by mouth 2 (two) times daily. 07/18/19   [provider]  Cholecalciferol (VITAMIN D3) 1000 UNITS CAPS Take 2,000 Units by mouth every 30 (thirty) days.     [provider]  cyclobenzaprine (FLEXERIL) 5 MG tablet Take by mouth as needed.  07/18/19   [provider]  diazepam (VALIUM) 5 MG tablet Take 5 mg by mouth as needed.  07/18/19   [provider]  diclofenac Sodium (VOLTAREN) 1 % GEL Apply topically as needed.  09/17/18   [provider]  docusate sodium (COLACE) 100 MG capsule Take 100 mg by mouth daily as needed for mild constipation.    [provider]  DULoxetine (CYMBALTA) 60 MG capsule Take 60 mg by mouth daily.  05/13/19   [provider]  ferrous sulfate 325 (65 FE) MG tablet TAKE 1 TABLET (325 MG TOTAL) BY MOUTH 2 (TWO) TIMES DAILY WITH A MEAL. 04/15/19   [provider]  FLUoxetine (PROZAC) 20 MG capsule Take 20 mg by mouth daily. 05/01/20   [provider]  fluticasone (FLONASE) 50 MCG/ACT nasal spray 2 sprays by Each Nare route daily as needed for allergies. 06/27/18    [provider]  furosemide (LASIX) 20 MG tablet Take 20 mg by mouth daily as needed (fluid retention/swelling.).     [provider]  gabapentin (NEURONTIN) 100 MG capsule Take 100 mg in the morning and 200 mg in the evening 04/30/19   [provider]  guaiFENesin (MUCINEX) 600 MG 12 hr tablet Take by mouth as needed.     [provider]  ipratropium-albuterol (DUONEB) 0.5-2.5 (3) MG/3ML SOLN Inhale 3 mLs into the lungs as needed.     [provider]  magnesium oxide (MAG-OX) 400 MG tablet Take 400 mg by mouth daily.  07/30/18   [provider]  oxyCODONE (OXY IR/ROXICODONE) 5 MG immediate release tablet Take 5 mg by mouth as needed.  Patient not taking: Reported on 07/29/2020    [provider]  oxyCODONE-acetaminophen (PERCOCET/ROXICET) 5-325 MG tablet 1/4-1/2 tab QID prn severe leg pain. 07/23/20   [provider]  potassium chloride (KLOR-CON) 10 MEQ tablet Take 10 mEq by mouth as needed. With furosemide    [provider]  rOPINIRole (REQUIP) 2 MG tablet Take 2 mg by mouth at bedtime.  05/29/15   [provider]  senna (SENOKOT) 8.6 MG tablet Take 1 tablet by mouth as needed.  07/18/19   [provider]    Allergies Cephalexin, Latex, Morphine, Tizanidine, Liothyronine, Other, Statins, Tape, Tapentadol, and Sulfa antibiotics  Family History  Family history unknown: Yes    Social History Social History   Tobacco Use  . Smoking status: Former Smoker    Packs/day: 1.00    Years: 5.00    Pack years: 5.00    Types: Cigarettes    Quit date: 12/29/1970    Years since quitting: 49.7  . Smokeless tobacco: Never Used  Vaping Use  . Vaping Use: Never used  Substance Use Topics  . Alcohol use: No  . Drug use: No    Review of Systems Constitutional: No fever/chills Eyes: No visual changes. ENT: No sore throat. Cardiovascular: Denies chest pain. Respiratory: Denies shortness of  breath. Gastrointestinal: Acute onset bright red blood per rectum.  No abdominal pain.  No nausea, no vomiting.  No diarrhea.  No constipation. Genitourinary: Negative for dysuria. Musculoskeletal: Pain in her shoulders after fall earlier tonight. Integumentary: Negative for rash. Neurological: Negative for headaches, focal weakness or numbness.   ____________________________________________   PHYSICAL EXAM:  VITAL  SIGNS: ED Triage Vitals  Enc Vitals Group     BP 09/09/20 2334 (!) 161/71     Pulse Rate 09/09/20 2334 85     Resp 09/09/20 2334 16     Temp 09/09/20 2334 98.6 F (37 C)     Temp Source 09/09/20 2334 Oral     SpO2 09/09/20 2334 98 %     Weight 09/10/20 0023 76.2 kg (167 lb 15.9 oz)     Height 09/10/20 0023 1.753 m (5\' 9" )     Head Circumference --      Peak Flow --      Pain Score 09/10/20 0023 0     Pain Loc --      Pain Edu? --      Excl. in GC? --     Constitutional: Alert and oriented.  Eyes: Conjunctivae are normal.  Head: Atraumatic. Nose: No congestion/rhinnorhea. Mouth/Throat: Patient is wearing a mask. Neck: No stridor.  No meningeal signs.   Cardiovascular: Normal rate, regular rhythm. Good peripheral circulation. Respiratory: Normal respiratory effort.  No retractions. Gastrointestinal: Soft and nontender. No distention.  Rectal exam notable for some reddish stool though not exactly hematochezia or melena but strongly heme positive.  ED nurse present as chaperone throughout exam. Musculoskeletal: No lower extremity tenderness nor edema. No gross deformities of extremities. Neurologic:  Normal speech and language. No gross focal neurologic deficits are appreciated.  Skin:  Skin is warm, dry and intact. Psychiatric: Mood and affect are normal. Speech and behavior are normal.  ____________________________________________   LABS (all labs ordered are listed, but only abnormal results are displayed)  Labs Reviewed  COMPREHENSIVE METABOLIC PANEL -  Abnormal; Notable for the following components:      Result Value   Creatinine, Ser 1.25 (*)    GFR, Estimated 45 (*)    All other components within normal limits  CBC WITH DIFFERENTIAL/PLATELET - Abnormal; Notable for the following components:   RBC 3.80 (*)    Hemoglobin 11.7 (*)    All other components within normal limits  PROTIME-INR - Abnormal; Notable for the following components:   Prothrombin Time 19.5 (*)    INR 1.7 (*)    All other components within normal limits  URINALYSIS, ROUTINE W REFLEX MICROSCOPIC - Abnormal; Notable for the following components:   Color, Urine YELLOW (*)    APPearance HAZY (*)    Hgb urine dipstick SMALL (*)    Nitrite POSITIVE (*)    Leukocytes,Ua SMALL (*)    WBC, UA >50 (*)    Bacteria, UA RARE (*)    All other components within normal limits  URINE CULTURE  CBC  BASIC METABOLIC PANEL  TYPE AND SCREEN  TYPE AND SCREEN  TYPE AND SCREEN  TROPONIN I (HIGH SENSITIVITY)   ____________________________________________  EKG  ED ECG REPORT I, 09/12/20, the attending physician, personally viewed and interpreted this ECG.  Date: 09/10/2020 EKG Time: 4:09 AM Rate: 64 Rhythm: Junctional rhythm QRS Axis: normal Intervals: Nonspecific intraventricular conduction delay with LAD ST/T Wave abnormalities: Non-specific ST segment / T-wave changes, but no clear evidence of acute ischemia.  The computer educated that the EKG is concerning for acute MI, but I believe this is due to lead placement and artifact and not representative of MI.  The patient is not having chest pain.  Repeat EKG is reassuring. Narrative Interpretation: no definitive evidence of acute ischemia; does not meet STEMI criteria.   ED ECG REPORT I, 09/12/2020, the attending  physician, personally viewed and interpreted this ECG.  Date: 09/10/2020 EKG Time: 4:12 AM Rate: 64 Rhythm: Junctional rhythm QRS Axis: normal Intervals: Nonspecific intraventricular conduction delay  with LAD ST/T Wave abnormalities: Non-specific ST segment / T-wave changes, but no clear evidence of acute ischemia. Narrative Interpretation: no definitive evidence of acute ischemia; does not meet STEMI criteria.    ____________________________________________  RADIOLOGY I, Loleta Rose, personally viewed and evaluated these images (plain radiographs) as part of my medical decision making, as well as reviewing the written report by the radiologist.  ED MD interpretation: No imaging indicated nor ordered on this particular visit.  Imaging from earlier tonight did not reveal any acute fractures.  Official radiology report(s):   ____________________________________________   PROCEDURES   Procedure(s) performed (including Critical Care):  Procedures   ____________________________________________   INITIAL IMPRESSION / MDM / ASSESSMENT AND PLAN / ED COURSE  As part of my medical decision making, I reviewed the following data within the electronic MEDICAL RECORD NUMBER Nursing notes reviewed and incorporated, Labs reviewed , EKG interpreted , Old chart reviewed, Discussed with admitting physician (Dr. Sedalia Muta) and Notes from prior ED visits   Differential diagnosis includes, but is not limited to, AV malformation, diverticulosis, nonspecific bleeding in the setting of anticoagulation, neoplasm, less likely traumatic injury.  The patient is on the cardiac monitor to evaluate for evidence of arrhythmia and/or significant heart rate changes.  The patient is not in distress right now but was very concerned about the amount of blood seen in the depends and with her bowel movement.  Labs are pending but given the acute onset of symptoms and her chronic anticoagulation on warfarin she may benefit from hospitalization for monitoring of the GI bleeding for further work-up to try to identify a source.  Based on her risk of thromboembolic event, I do not feel she would benefit from emergent reversal  at this time based on the relatively small amount of blood visualized thus far.       Clinical Course as of 09/10/20 0539  Thu Sep 10, 2020  0333 Hemoglobin(!): 11.7 [CF]  0402 Hemoglobin is stable from prior but the onset of the bleeding was acute.  Her INR is slightly subtherapeutic which is actually somewhat reassuring in this case.  However she needs to be on anticoagulation for her mitral valve replacement and the onset of GI bleeding somewhat concerning.  Given the circumstances, comorbidities, etc., I will contact the hospitalist for admission for further GI bleeding work-up.  No indication for emergent imaging in the ED tonight as a CT scan of the abdomen pelvis is very unlikely to be revealing and I will defer to GI for appropriate imaging in the morning. [CF]  0432 I discussed the case in person with Dr. Sedalia Muta with the hospitalist service and she will admit. [CF]  0434 Urinalysis, Routine w reflex microscopic Urine, Catheterized(!) Positive UTI but without evidence of systemic infection.  Given her reported history of rash with cephalosporins, I ordered fosfomycin 3 g by mouth and informed to the hospitalist so she could decide how to proceed if she feels additional treatment is necessary. [CF]    Clinical Course User Index [CF] Loleta Rose, MD     ____________________________________________  FINAL CLINICAL IMPRESSION(S) / ED DIAGNOSES  Final diagnoses:  Acute GI bleeding  Current use of long term anticoagulation  H/O mitral valve replacement  Urinary tract infection without hematuria, site unspecified     MEDICATIONS GIVEN DURING THIS VISIT:  Medications  pantoprazole (PROTONIX) injection 40 mg (40 mg Intravenous Given 09/10/20 0513)  fosfomycin (MONUROL) packet 3 g (0 g Oral Hold 09/10/20 0513)  diltiazem (CARDIZEM CD) 24 hr capsule 120 mg (has no administration in time range)  diazepam (VALIUM) tablet 5 mg (has no administration in time range)  FLUoxetine (PROZAC)  capsule 20 mg (has no administration in time range)  ferrous sulfate tablet 325 mg (has no administration in time range)  rOPINIRole (REQUIP) tablet 2 mg (has no administration in time range)  gabapentin (NEURONTIN) capsule 100 mg (has no administration in time range)  acetaminophen (TYLENOL) tablet 325 mg (has no administration in time range)    Or  acetaminophen (TYLENOL) suppository 650 mg (has no administration in time range)  ondansetron (ZOFRAN) tablet 4 mg (has no administration in time range)    Or  ondansetron (ZOFRAN) injection 4 mg (has no administration in time range)  acetaminophen (TYLENOL) tablet 650 mg (650 mg Oral Given 09/10/20 0352)     ED Discharge Orders    None      *Please note:  Rhonda Griffith was evaluated in Emergency Department on 09/10/2020 for the symptoms described in the history of present illness. She was evaluated in the context of the global COVID-19 pandemic, which necessitated consideration that the patient might be at risk for infection with the SARS-CoV-2 virus that causes COVID-19. Institutional protocols and algorithms that pertain to the evaluation of patients at risk for COVID-19 are in a state of rapid change based on information released by regulatory bodies including the CDC and federal and state organizations. These policies and algorithms were followed during the patient's care in the ED.  Some ED evaluations and interventions may be delayed as a result of limited staffing during and after the pandemic.*  Note:  This document was prepared using Dragon voice recognition software and may include unintentional dictation errors.   Loleta RoseForbach, Kelyn Ponciano, MD 09/10/20 [phone]

## 2020-09-10 NOTE — ED Notes (Signed)
Pt smelled of urine upon arrival and reports she is incontinent. RN and ED tech to bedside to change pt and assess bleeding. Clots noted and bright red blood noted to depends but RN unable to determine source of bleeding at this time.

## 2020-09-11 ENCOUNTER — Encounter: Payer: Self-pay | Admitting: Internal Medicine

## 2020-09-11 ENCOUNTER — Inpatient Hospital Stay: Payer: Medicare Other | Admitting: Anesthesiology

## 2020-09-11 ENCOUNTER — Encounter
Admission: EM | Disposition: A | Discharge status: Home / Self Care | Payer: Self-pay | Source: Home / Self Care | Attending: Internal Medicine

## 2020-09-11 DIAGNOSIS — R319 Hematuria, unspecified: Secondary | ICD-10-CM | POA: Diagnosis present

## 2020-09-11 DIAGNOSIS — I4821 Permanent atrial fibrillation: Secondary | ICD-10-CM | POA: Diagnosis present

## 2020-09-11 DIAGNOSIS — G2581 Restless legs syndrome: Secondary | ICD-10-CM | POA: Diagnosis present

## 2020-09-11 DIAGNOSIS — I272 Pulmonary hypertension, unspecified: Secondary | ICD-10-CM | POA: Diagnosis present

## 2020-09-11 DIAGNOSIS — I130 Hypertensive heart and chronic kidney disease with heart failure and stage 1 through stage 4 chronic kidney disease, or unspecified chronic kidney disease: Secondary | ICD-10-CM | POA: Diagnosis present

## 2020-09-11 DIAGNOSIS — E7800 Pure hypercholesterolemia, unspecified: Secondary | ICD-10-CM | POA: Diagnosis present

## 2020-09-11 DIAGNOSIS — Z950 Presence of cardiac pacemaker: Secondary | ICD-10-CM | POA: Diagnosis not present

## 2020-09-11 DIAGNOSIS — I5032 Chronic diastolic (congestive) heart failure: Secondary | ICD-10-CM | POA: Diagnosis present

## 2020-09-11 DIAGNOSIS — G4733 Obstructive sleep apnea (adult) (pediatric): Secondary | ICD-10-CM | POA: Diagnosis present

## 2020-09-11 DIAGNOSIS — Z96653 Presence of artificial knee joint, bilateral: Secondary | ICD-10-CM | POA: Diagnosis present

## 2020-09-11 DIAGNOSIS — F419 Anxiety disorder, unspecified: Secondary | ICD-10-CM | POA: Diagnosis present

## 2020-09-11 DIAGNOSIS — K648 Other hemorrhoids: Secondary | ICD-10-CM | POA: Diagnosis present

## 2020-09-11 DIAGNOSIS — K625 Hemorrhage of anus and rectum: Secondary | ICD-10-CM | POA: Diagnosis not present

## 2020-09-11 DIAGNOSIS — K922 Gastrointestinal hemorrhage, unspecified: Secondary | ICD-10-CM | POA: Diagnosis present

## 2020-09-11 DIAGNOSIS — Z8673 Personal history of transient ischemic attack (TIA), and cerebral infarction without residual deficits: Secondary | ICD-10-CM | POA: Diagnosis not present

## 2020-09-11 DIAGNOSIS — W010XXA Fall on same level from slipping, tripping and stumbling without subsequent striking against object, initial encounter: Secondary | ICD-10-CM | POA: Diagnosis present

## 2020-09-11 DIAGNOSIS — M48061 Spinal stenosis, lumbar region without neurogenic claudication: Secondary | ICD-10-CM | POA: Diagnosis present

## 2020-09-11 DIAGNOSIS — N390 Urinary tract infection, site not specified: Secondary | ICD-10-CM | POA: Diagnosis present

## 2020-09-11 DIAGNOSIS — Z79899 Other long term (current) drug therapy: Secondary | ICD-10-CM | POA: Diagnosis not present

## 2020-09-11 DIAGNOSIS — I060 Rheumatic aortic stenosis: Secondary | ICD-10-CM | POA: Diagnosis present

## 2020-09-11 DIAGNOSIS — Z85820 Personal history of malignant melanoma of skin: Secondary | ICD-10-CM | POA: Diagnosis not present

## 2020-09-11 DIAGNOSIS — N181 Chronic kidney disease, stage 1: Secondary | ICD-10-CM | POA: Diagnosis present

## 2020-09-11 DIAGNOSIS — Y92008 Other place in unspecified non-institutional (private) residence as the place of occurrence of the external cause: Secondary | ICD-10-CM | POA: Diagnosis not present

## 2020-09-11 DIAGNOSIS — Z66 Do not resuscitate: Secondary | ICD-10-CM | POA: Diagnosis present

## 2020-09-11 DIAGNOSIS — Z20822 Contact with and (suspected) exposure to covid-19: Secondary | ICD-10-CM | POA: Diagnosis present

## 2020-09-11 DIAGNOSIS — E785 Hyperlipidemia, unspecified: Secondary | ICD-10-CM | POA: Diagnosis present

## 2020-09-11 DIAGNOSIS — Z7901 Long term (current) use of anticoagulants: Secondary | ICD-10-CM | POA: Diagnosis not present

## 2020-09-11 DIAGNOSIS — W19XXXA Unspecified fall, initial encounter: Secondary | ICD-10-CM | POA: Diagnosis present

## 2020-09-11 HISTORY — PX: COLONOSCOPY WITH PROPOFOL: SHX5780

## 2020-09-11 LAB — URINE CULTURE
Culture: NO GROWTH
Special Requests: NORMAL

## 2020-09-11 LAB — PROTIME-INR
INR: 1.4 — ABNORMAL HIGH (ref 0.8–1.2)
Prothrombin Time: 16.8 seconds — ABNORMAL HIGH (ref 11.4–15.2)

## 2020-09-11 LAB — HEMOGLOBIN: Hemoglobin: 12.5 g/dL (ref 12.0–15.0)

## 2020-09-11 SURGERY — COLONOSCOPY WITH PROPOFOL
Anesthesia: General

## 2020-09-11 MED ORDER — MAGNESIUM CITRATE PO SOLN
1.0000 | Freq: Once | ORAL | Status: AC
  Administered 2020-09-11: 09:00:00 1 via ORAL
  Filled 2020-09-11: qty 296

## 2020-09-11 MED ORDER — WARFARIN - PHARMACIST DOSING INPATIENT: Freq: Every day | Status: DC

## 2020-09-11 MED ORDER — LIDOCAINE HCL (PF) 2 % IJ SOLN
INTRAMUSCULAR | Status: AC
  Filled 2020-09-11: qty 5

## 2020-09-11 MED ORDER — LIDOCAINE HCL (CARDIAC) PF 100 MG/5ML IV SOSY
PREFILLED_SYRINGE | INTRAVENOUS | Status: DC | PRN
  Administered 2020-09-11: 50 mg via INTRAVENOUS

## 2020-09-11 MED ORDER — PROPOFOL 500 MG/50ML IV EMUL
INTRAVENOUS | Status: AC
  Filled 2020-09-11: qty 50

## 2020-09-11 MED ORDER — SODIUM CHLORIDE 0.9 % IV SOLN
INTRAVENOUS | Status: DC
  Administered 2020-09-11: 15:00:00 1000 mL via INTRAVENOUS

## 2020-09-11 MED ORDER — PROPOFOL 10 MG/ML IV BOLUS
INTRAVENOUS | Status: DC | PRN
  Administered 2020-09-11: 60 mg via INTRAVENOUS

## 2020-09-11 MED ORDER — PROPOFOL 500 MG/50ML IV EMUL
INTRAVENOUS | Status: DC | PRN
  Administered 2020-09-11: 150 ug/kg/min via INTRAVENOUS

## 2020-09-11 MED ORDER — WARFARIN SODIUM 3 MG PO TABS
3.5000 mg | ORAL_TABLET | Freq: Once | ORAL | Status: AC
  Administered 2020-09-11: 22:00:00 3.5 mg via ORAL
  Filled 2020-09-11: qty 1

## 2020-09-11 NOTE — Op Note (Signed)
[facility] Gastroenterology Patient Name: Rhonda Griffith Procedure Date: 09/11/2020 3:00 PM MRN: [id number] Account #: [id number] Date of Birth: 1946/04/10 Admit Type: Inpatient Age: 74 Room: [facility] ENDO ROOM 2 Gender: Female Note Status: Finalized Procedure:             Colonoscopy Indications:           Hematochezia Providers:             Midge Minium MD, MD Referring MD:          Jerold Coombe, MD (Referring MD) Medicines:             Propofol per Anesthesia Complications:         No immediate complications. Procedure:             Pre-Anesthesia Assessment:                        - Prior to the procedure, a History and Physical was                         performed, and patient medications and allergies were                         reviewed. The patient's tolerance of previous                         anesthesia was also reviewed. The risks and benefits                         of the procedure and the sedation options and risks                         were discussed with the patient. All questions were                         answered, and informed consent was obtained. Prior                         Anticoagulants: The patient has taken Coumadin                         (warfarin), last dose was 2 days prior to procedure.                         ASA Grade Assessment: III - A patient with severe                         systemic disease. After reviewing the risks and                         benefits, the patient was deemed in satisfactory                         condition to undergo the procedure.                        After obtaining informed consent, the colonoscope was  passed under direct vision. Throughout the procedure,                         the patient's blood pressure, pulse, and oxygen                         saturations were monitored continuously. The                         Colonoscope was introduced through the anus  and                         advanced to the the cecum, identified by appendiceal                         orifice and ileocecal valve. The colonoscopy was                         performed without difficulty. The patient tolerated                         the procedure well. The quality of the bowel                         preparation was fair. Findings:      The perianal and digital rectal examinations were normal.      Non-bleeding internal hemorrhoids were found during retroflexion. The       hemorrhoids were Grade II (internal hemorrhoids that prolapse but reduce       spontaneously). Impression:            - Preparation of the colon was fair.                        - Non-bleeding internal hemorrhoids.                        - No specimens collected. Recommendation:        - Return patient to hospital ward for ongoing care.                        - Resume previous diet.                        - Continue present medications. Procedure Code(s):     --- Professional ---                        [id number], Colonoscopy, flexible; diagnostic, including                         collection of specimen(s) by brushing or washing, when                         performed (separate procedure) Diagnosis Code(s):     --- Professional ---                        K92.1, Melena (includes Hematochezia) CPT copyright 2019 American Medical Association. All rights reserved. The codes documented in this report are preliminary and upon coder review may  be revised to meet current compliance requirements. Midge Minium MD, MD 09/11/2020 3:33:15 PM This report has been signed electronically. Number of Addenda: 0 Note Initiated On: 09/11/2020 3:00 PM Scope Withdrawal Time: 0 hours 8 minutes 35 seconds  Total Procedure Duration: 0 hours 14 minutes 30 seconds  Estimated Blood Loss:  Estimated blood loss: none. Estimated blood loss: none.      [facility]

## 2020-09-11 NOTE — Progress Notes (Signed)
Patient ID: Rhonda Griffith, female   DOB: 08/21/1946, 74 y.o.   MRN: [id number]  Per Dr Servando Snare --colonoscopy showed non-bleeding internal hemorrhoids were found during retroflexion. The       hemorrhoids were Grade II (internal hemorrhoids that prolapse but reduce       spontaneously).  No active bleeding reported by RN. Patient otherwise stable. Will resume Coumadin tonight. Discussed with pharmacy to do Coumadin dosing  If Patient remains stable will discharged to home tomorrow. Discussed with daughter Rhonda Griffith above on the phone. She appreciated the phone call.

## 2020-09-11 NOTE — Anesthesia Procedure Notes (Signed)
Date/Time: 09/11/2020 3:07 PM Performed by: Ginger Carne, CRNA Pre-anesthesia Checklist: Patient identified, Emergency Drugs available, Suction available, Patient being monitored and Timeout performed Patient Re-evaluated:Patient Re-evaluated prior to induction Oxygen Delivery Method: Nasal cannula Preoxygenation: Pre-oxygenation with 100% oxygen Induction Type: IV induction

## 2020-09-11 NOTE — Transfer of Care (Signed)
Immediate Anesthesia Transfer of Care Note  Patient: Rhonda Griffith  Procedure(s) Performed: COLONOSCOPY WITH PROPOFOL (N/A )  Patient Location: PACU  Anesthesia Type:General  Level of Consciousness: sedated  Airway & Oxygen Therapy: Patient Spontanous Breathing and Patient connected to nasal cannula oxygen  Post-op Assessment: Report given to RN and Post -op Vital signs reviewed and stable  Post vital signs: Reviewed and stable  Last Vitals:  Vitals Value Taken Time  BP 115/52 09/11/20 1535  Temp    Pulse 61 09/11/20 1535  Resp 20 09/11/20 1535  SpO2 96 % 09/11/20 1535    Last Pain:  Vitals:   09/11/20 1535  TempSrc:   PainSc: 0-No pain      Patients Stated Pain Goal: 0 (09/11/20 1450)  Complications: No complications documented.

## 2020-09-11 NOTE — Anesthesia Postprocedure Evaluation (Signed)
Anesthesia Post Note  Patient: Rhonda Griffith  Procedure(s) Performed: COLONOSCOPY WITH PROPOFOL (N/A )  Patient location during evaluation: Endoscopy Anesthesia Type: General Level of consciousness: awake and alert Pain management: pain level controlled Vital Signs Assessment: post-procedure vital signs reviewed and stable Respiratory status: spontaneous breathing, nonlabored ventilation, respiratory function stable and patient connected to nasal cannula oxygen Cardiovascular status: blood pressure returned to baseline and stable Postop Assessment: no apparent nausea or vomiting Anesthetic complications: no   No complications documented.   Last Vitals:  Vitals:   09/11/20 1535 09/11/20 1545  BP: (!) 115/52 (!) 133/54  Pulse: 61 65  Resp: 20 (!) 22  Temp:    SpO2: 96% 97%    Last Pain:  Vitals:   09/11/20 1545  TempSrc:   PainSc: 0-No pain                 Corinda Gubler

## 2020-09-11 NOTE — Plan of Care (Signed)
  Problem: Education: Goal: Knowledge of General Education information will improve Description: Including pain rating scale, medication(s)/side effects and non-pharmacologic comfort measures Outcome: Progressing   Problem: Clinical Measurements: Goal: Ability to maintain clinical measurements within normal limits will improve Outcome: Progressing Goal: Diagnostic test results will improve Outcome: Progressing   Problem: Nutrition: Goal: Adequate nutrition will be maintained Outcome: Progressing   Problem: Pain Managment: Goal: General experience of comfort will improve Outcome: Progressing   Problem: Safety: Goal: Ability to remain free from injury will improve Outcome: Progressing   

## 2020-09-11 NOTE — Consult Note (Addendum)
ANTICOAGULATION CONSULT NOTE - Consult  Pharmacy Consult for Warfarin dosing Indication: Mitral Valve Repair with Atrial Fibrillation  Allergies  Allergen Reactions  . Cephalexin Rash  . Latex Rash  . Morphine Nausea And Vomiting  . Tizanidine Palpitations  . Liothyronine Other (See Comments)  . Other Other (See Comments)    Several different statins.  . Statins Other (See Comments)    Several different statins.  . Tape Other (See Comments)    Breaks her skin Allergic to plastic/latex tape. Only use paper tape on patient. Breaks her skin: Allergic to plastic/latex tape. Only use paper tape on patient.  . Tapentadol Other (See Comments)  . Sulfa Antibiotics Rash and Other (See Comments)    Patient Measurements: Height: 5\' 9"  (175.3 cm) Weight: 76.2 kg (167 lb 15.9 oz) IBW/kg (Calculated) : 66.2  Vital Signs: Temp: 97 F (36.1 C) (12/17 1450) Temp Source: Temporal (12/17 1450) BP: 133/54 (12/17 1545) Pulse Rate: 65 (12/17 1545)  Labs: Recent Labs    09/10/20 0301 09/10/20 0514 09/10/20 0934 09/11/20 0825  HGB 11.7* 10.3*  --  12.5  HCT 38.0 33.1*  --   --   PLT 224 202  --   --   LABPROT 19.5*  --   --   --   INR 1.7*  --   --   --   CREATININE 1.25* 1.08*  --   --   TROPONINIHS  --  38* 38*  --     Estimated Creatinine Clearance: 46.3 mL/min (A) (by C-G formula based on SCr of 1.08 mg/dL (H)).   Medications:  No active ordered interacting medications. Home ropinirole 2mg  dose with stable INRs in outpatient setting. No pertinent AC related drug allergies.  Assessment: Mitral Valve Repair (tissue) with Afib. PTA warfarin dosing was 3mg  Tue/Sat & 2.5mg  All other days. Last reported INR goal was 2-3. (Historically had tried management with Xarelto and Eliquis before coming back to warfarin.)  Date INR Dose Comment 12/16 1.7 -- Held ISO of BRBPR 12/17 1.4 3.5mg  (~20% inc once)  Goal of Therapy:  INR 2-3 Monitor platelets by anticoagulation protocol:  Yes   Plan:  Resume Warfarin tonight. Considering missed dose and last INR 1.7, will increase dose x1 to 3.5mg  (~20% inc from maintenance dose); then assess with morning INR.   Ivaan Liddy 09/11/2020,4:13 PM

## 2020-09-11 NOTE — Progress Notes (Signed)
[facility]  - [place] [facility]   PATIENT NAME: Rhonda Griffith    MR#:  [id number]  DATE OF BIRTH:  28-Apr-1946  SUBJECTIVE:   Patient still having dark brown return after overnight prep. Received bolus makes the trade. Was on the bedside commode. Denies any complaints. No family in the room. REVIEW OF SYSTEMS:   Review of Systems  Constitutional: Negative for chills, fever and weight loss.  HENT: Negative for ear discharge, ear pain and nosebleeds.   Eyes: Negative for blurred vision, pain and discharge.  Respiratory: Negative for sputum production, shortness of breath, wheezing and stridor.   Cardiovascular: Negative for chest pain, palpitations, orthopnea and PND.  Gastrointestinal: Positive for blood in stool. Negative for abdominal pain, diarrhea, nausea and vomiting.  Genitourinary: Negative for frequency and urgency.  Musculoskeletal: Negative for back pain and joint pain.  Neurological: Negative for sensory change, speech change, focal weakness and weakness.  Psychiatric/Behavioral: Negative for depression and hallucinations. The patient is not nervous/anxious.    Tolerating Diet:npo Tolerating PT: pending  DRUG ALLERGIES:   Allergies  Allergen Reactions  . Cephalexin Rash  . Latex Rash  . Morphine Nausea And Vomiting  . Tizanidine Palpitations  . Liothyronine Other (See Comments)  . Other Other (See Comments)    Several different statins.  . Statins Other (See Comments)    Several different statins.  . Tape Other (See Comments)    Breaks her skin Allergic to plastic/latex tape. Only use paper tape on patient. Breaks her skin: Allergic to plastic/latex tape. Only use paper tape on patient.  . Tapentadol Other (See Comments)  . Sulfa Antibiotics Rash and Other (See Comments)    VITALS:  Blood pressure (!) 132/51, pulse 62, temperature (!) 97.5 F (36.4 C), temperature source Oral, resp. rate 14, height 5\' 9"  (1.753 m), weight 76.2 kg,  SpO2 98 %.  PHYSICAL EXAMINATION:   Physical Exam  GENERAL:  74 y.o.-year-old patient lying in the bed with no acute distress.  HEENT: Head atraumatic, normocephalic. Oropharynx and nasopharynx clear.  LUNGS: Normal breath sounds bilaterally, no wheezing, rales, rhonchi. No use of accessory muscles of respiration.  CARDIOVASCULAR: S1, S2 normal. No murmurs, rubs, or gallops.  ABDOMEN: Soft, nontender, nondistended. Bowel sounds present. No organomegaly or mass.  EXTREMITIES: No cyanosis, clubbing or edema b/l.    NEUROLOGIC: Cranial nerves II through XII are intact. No focal Motor or sensory deficits b/l.  weak PSYCHIATRIC:  patient is alert and oriented x 3.  SKIN: No obvious rash, lesion, or ulcer.   LABORATORY PANEL:  CBC Recent Labs  Lab 09/10/20 0514 09/11/20 0825  WBC 7.4  --   HGB 10.3* 12.5  HCT 33.1*  --   PLT 202  --     Chemistries  Recent Labs  Lab 09/10/20 0301 09/10/20 0514  NA 141 139  K 4.6 4.1  CL 107 108  CO2 25 24  GLUCOSE 96 89  BUN 22 21  CREATININE 1.25* 1.08*  CALCIUM 10.0 9.5  AST 19  --   ALT 15  --   ALKPHOS 64  --   BILITOT 0.7  --    Cardiac Enzymes No results for input(s): TROPONINI in the last 168 hours. RADIOLOGY:  DG Pelvis 1-2 Views  Result Date: 09/09/2020 CLINICAL DATA:  09/11/2020 today. EXAM: PELVIS - 1-2 VIEW COMPARISON:  04/10/2019 FINDINGS: Interval right hip hemiarthroplasty in satisfactory position. Negative for acute fracture. Irregularity of the left inferior pubic ramus which was  not present previously may represent a healing fracture. Limited views of this area. IMPRESSION: Right hip hemiarthroplasty. Irregularity left inferior pubic ramus may represent healing fracture. There is pain in this area, recommend further images of the left hip. Electronically Signed   By: Marlan Palau M.D.   On: 09/09/2020 14:34   CT Head Wo Contrast  Result Date: 09/09/2020 CLINICAL DATA:  Status post fall.  Hit head.  On anticoagulation  EXAM: CT HEAD WITHOUT CONTRAST CT CERVICAL SPINE WITHOUT CONTRAST TECHNIQUE: Multidetector CT imaging of the head and cervical spine was performed following the standard protocol without intravenous contrast. Multiplanar CT image reconstructions of the cervical spine were also generated. COMPARISON:  04/10/2019. FINDINGS: CT HEAD FINDINGS Brain: No evidence of acute infarction, hemorrhage, hydrocephalus, extra-axial collection or mass lesion/mass effect. There is mild diffuse low-attenuation within the subcortical and periventricular white matter compatible with chronic microvascular disease. Chronic infarcts within the right basal ganglia, left thalamus and right cerebellar hemisphere. Prominence of the sulci and ventricles are again noted compatible with brain atrophy. Vascular: No hyperdense vessel or unexpected calcification. Skull: Normal. Negative for fracture or focal lesion. Sinuses/Orbits: No acute finding. Other: None. CT CERVICAL SPINE FINDINGS Alignment: Normal Skull base and vertebrae: No acute fracture. No primary bone lesion or focal pathologic process. Soft tissues and spinal canal: No prevertebral fluid or swelling. No visible canal hematoma. Disc levels: Disc space narrowing and endplate spurring noted at C5-6 and C6-7. Multi level facet arthropathy is identified. Upper chest: Negative. Other: None IMPRESSION: 1. No acute intracranial abnormalities. 2. Chronic small vessel ischemic disease and brain atrophy. 3. No evidence for cervical spine fracture. 4. Cervical spondylosis. Electronically Signed   By: Signa Kell M.D.   On: 09/09/2020 15:55   CT Cervical Spine Wo Contrast  Result Date: 09/09/2020 CLINICAL DATA:  Status post fall.  Hit head.  On anticoagulation EXAM: CT HEAD WITHOUT CONTRAST CT CERVICAL SPINE WITHOUT CONTRAST TECHNIQUE: Multidetector CT imaging of the head and cervical spine was performed following the standard protocol without intravenous contrast. Multiplanar CT image  reconstructions of the cervical spine were also generated. COMPARISON:  04/10/2019. FINDINGS: CT HEAD FINDINGS Brain: No evidence of acute infarction, hemorrhage, hydrocephalus, extra-axial collection or mass lesion/mass effect. There is mild diffuse low-attenuation within the subcortical and periventricular white matter compatible with chronic microvascular disease. Chronic infarcts within the right basal ganglia, left thalamus and right cerebellar hemisphere. Prominence of the sulci and ventricles are again noted compatible with brain atrophy. Vascular: No hyperdense vessel or unexpected calcification. Skull: Normal. Negative for fracture or focal lesion. Sinuses/Orbits: No acute finding. Other: None. CT CERVICAL SPINE FINDINGS Alignment: Normal Skull base and vertebrae: No acute fracture. No primary bone lesion or focal pathologic process. Soft tissues and spinal canal: No prevertebral fluid or swelling. No visible canal hematoma. Disc levels: Disc space narrowing and endplate spurring noted at C5-6 and C6-7. Multi level facet arthropathy is identified. Upper chest: Negative. Other: None IMPRESSION: 1. No acute intracranial abnormalities. 2. Chronic small vessel ischemic disease and brain atrophy. 3. No evidence for cervical spine fracture. 4. Cervical spondylosis. Electronically Signed   By: Signa Kell M.D.   On: 09/09/2020 15:55   CT PELVIS WO CONTRAST  Addendum Date: 09/09/2020   ADDENDUM REPORT: 09/09/2020 19:09 ADDENDUM: Probable fat necrosis and scarring within the left hip lateral soft tissues. Electronically Signed   By: Jasmine Pang M.D.   On: 09/09/2020 19:09   Result Date: 09/09/2020 CLINICAL DATA:  Pelvic trauma EXAM:  CT PELVIS WITHOUT CONTRAST TECHNIQUE: Multidetector CT imaging of the pelvis was performed following the standard protocol without intravenous contrast. COMPARISON:  Radiograph 09/09/2020, 04/13/2019 FINDINGS: Urinary Tract:  No abnormality visualized. Bowel:  Unremarkable  visualized pelvic bowel loops. Vascular/Lymphatic: Moderate aortic atherosclerosis. No aneurysm. No suspicious nodes Reproductive:  Uterus and adnexa are unremarkable Other:  Negative for pelvic effusion Musculoskeletal: Right hip replacement with associated artifact. No acute displaced fracture or malalignment is seen. Chronic appearing fracture deformity involving left inferior pubic ramus. Suspected chronic fracture deformity of the upper sacrum best seen on sagittal views. IMPRESSION: 1. Right hip replacement without acute displaced fracture or malalignment. 2. Chronic appearing fracture deformity involving the left inferior pubic ramus. Suspected chronic fracture deformity of the upper sacrum. MRI evaluation may be obtained depending upon clinical course and or continued suspicion for acute osseous injury. Aortic Atherosclerosis (ICD10-I70.0). Electronically Signed: By: Jasmine Pang M.D. On: 09/09/2020 18:58   NM GI Blood Loss  Result Date: 09/10/2020 CLINICAL DATA:  Right red blood per rectum last night in this morning, on anticoagulation with warfarin. EXAM: NUCLEAR MEDICINE GASTROINTESTINAL BLEEDING SCAN TECHNIQUE: Sequential abdominal images were obtained following intravenous administration of Tc-39m labeled red blood cells. RADIOPHARMACEUTICALS:  20.4 mCi Tc-34m pertechnetate in-vitro labeled red cells. COMPARISON:  09/10/2015 CT pelvis. FINDINGS: No scintigraphic evidence of active GI bleed in the abdomen or pelvis over 2 hours of imaging. Focal radiotracer accumulation in the perineum is favored represent excreted urine accumulating within patient diaper. IMPRESSION: No scintigraphic evidence of active GI bleed. Electronically Signed   By: Delbert Phenix M.D.   On: 09/10/2020 15:11   ASSESSMENT AND PLAN:  Dilia Alemany is a 74 y.o. female with medical history significant for mitral valve repair on warfarin, atrial fibrillation, anxiety, primary hypertension, history of chronic  diastolic heart failure, presented to the emergency department for chief concerns of bright red blood per rectum while using the restroom.  Bright red blood per rectum in setting of chronic anticoagulation use with warfarin -Confirmed by ED provider via Hemoccult -H&H is stable at 12.5 -No signs of active bleeding -Protonix 40 mg IV twice daily -G.I. planning for colonoscopy.  History of mitral valve repair-on warfarin Atrial fibrillation  -Subtherapeutic INR -Hold warfarin due to BRBPR  Pyuria - no UTI symptoms, ED provider ordered one time dose of fosfomycin  Primary hypertension-controlled, resumed home diltiazem 120 mg daily  Restless leg syndrome-resumed home Requip nightly  generalized weakness. Patient uses walker at home. She recent fall while walking out of the curbside. Will have physical therapy evaluate.  DVT prophylaxis: Holding pharmacological anticoagulation due to bright red blood per rectum, TED hose Code Status: DNR Family Communication: dter on the phone Disposition Plan: Pending clinical course Consults called: None at this time Admission status:  Inpateint   Patient currently is not medically stable to d/c. pending colonoscopy       TOTAL TIME TAKING CARE OF THIS PATIENT: 25 minutes.  >50% time spent on counselling and coordination of care  Note: This dictation was prepared with Dragon dictation along with smaller phrase technology. Any transcriptional errors that result from this process are unintentional.  Enedina Finner M.D    [facility]   CC: Primary care physician; Shapely-Quinn, Desiree Lucy, MDPatient ID: Marykay Lex, female   DOB: [birth year], 74 y.o.   MRN: [id number]

## 2020-09-11 NOTE — Anesthesia Preprocedure Evaluation (Signed)
Anesthesia Evaluation  Patient identified by MRN, date of birth, ID band Patient awake    Reviewed: Allergy & Precautions, H&P , NPO status , reviewed documented beta blocker date and time   Airway Mallampati: II  TM Distance: >3 FB Neck ROM: limited    Dental  (+) Missing, Chipped, Poor Dentition   Pulmonary shortness of breath, sleep apnea , COPD, former smoker,    Pulmonary exam normal breath sounds clear to auscultation       Cardiovascular hypertension, + Peripheral Vascular Disease  + pacemaker  Rhythm:Irregular Rate:Normal - Systolic murmurs ECHO 01/16/2017   Normal LV function  No significant valve disease  Overall good study   Neuro/Psych PSYCHIATRIC DISORDERS Anxiety  Neuromuscular disease CVA    GI/Hepatic GERD  Controlled,  Endo/Other    Renal/GU Renal disease     Musculoskeletal  (+) Arthritis ,   Abdominal   Peds  Hematology   Anesthesia Other Findings Past Medical History: No date: Atrial fibrillation, permanent     Comment:  a. CHA2DS2VASc = 4-->coumadin;  b. 09/2014 Echo: EF               55-60%, normal wall motion, mild AI/AS, nl MV, mod dil               LA, mildly dil RA, mild to mod TR, PASP . No date: Chronic kidney disease, stage I No date: CVA (cerebral infarction)     Comment:  a. 04/2015 - anticoagulation switched from eliquis to               xarelto to coumadin. No date: Leukocytosis No date: Pacemaker -[employer]     Comment:  a. implant for SSS No date: PAD (peripheral artery disease) (HCC)     Comment:  a. w/ left lower ext claudication s/p ABI's 04/2015               showing nl ABI on Right with abnl waveforms on left sugg               of L SFA dzs. 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 s/p mechanical valve replaced with bovine valve 2010.  Past Surgical History: No date: bladder injection No date:  botox bladder No date: INSERT / REPLACE / REMOVE PACEMAKER     Comment:  implant for SSS No date: MELANOMA EXCISION     Comment:  face No date: MITRAL VALVE REPLACEMENT     Comment:  s/p mechanical valve replaced with bovine valve 2010 No date: NASAL SINUS SURGERY 09/21/2018: PPM GENERATOR CHANGEOUT; N/A     Comment:  Procedure: PPM GENERATOR CHANGEOUT;  Surgeon: Duke Salvia, MD;  Location: [facility] INVASIVE CV LAB;  Service:               Cardiovascular;  Laterality: N/A;  BMI    Body Mass Index: 25.10 kg/m      Reproductive/Obstetrics                             Anesthesia Physical  Anesthesia Plan  ASA: III  Anesthesia Plan: General   Post-op Pain Management:    Induction: Intravenous  PONV Risk Score and Plan: 3 and Treatment may vary due to age or medical condition and TIVA  Airway Management Planned: Nasal Cannula and Natural Airway  Additional Equipment: None  Intra-op Plan:   Post-operative Plan:   Informed Consent: I have reviewed the patients History and Physical, chart, labs and discussed the procedure including the risks, benefits and alternatives for the proposed anesthesia with the patient or authorized representative who has indicated his/her understanding and acceptance.   Patient has DNR.  Discussed DNR with patient and Suspend DNR.   Dental Advisory Given  Plan Discussed with: CRNA  Anesthesia Plan Comments: (Discussed risks of anesthesia with patient, including possibility of difficulty with spontaneous ventilation under anesthesia necessitating airway intervention, PONV, and rare risks such as cardiac or respiratory or neurological events. Patient understands.)        Anesthesia Quick Evaluation

## 2020-09-12 DIAGNOSIS — K648 Other hemorrhoids: Secondary | ICD-10-CM

## 2020-09-12 DIAGNOSIS — K625 Hemorrhage of anus and rectum: Secondary | ICD-10-CM

## 2020-09-12 LAB — PROTIME-INR
INR: 1.5 — ABNORMAL HIGH (ref 0.8–1.2)
Prothrombin Time: 17.9 seconds — ABNORMAL HIGH (ref 11.4–15.2)

## 2020-09-12 MED ORDER — PANTOPRAZOLE SODIUM 40 MG PO TBEC
40.0000 mg | DELAYED_RELEASE_TABLET | Freq: Every day | ORAL | Status: DC
  Administered 2020-09-12: 09:00:00 40 mg via ORAL
  Filled 2020-09-12: qty 1

## 2020-09-12 MED ORDER — WARFARIN SODIUM 3 MG PO TABS
3.0000 mg | ORAL_TABLET | Freq: Once | ORAL | Status: DC
  Filled 2020-09-12: qty 1

## 2020-09-12 MED ORDER — CYCLOBENZAPRINE HCL 10 MG PO TABS
5.0000 mg | ORAL_TABLET | Freq: Once | ORAL | Status: AC
  Administered 2020-09-12: 14:00:00 5 mg via ORAL
  Filled 2020-09-12: qty 1

## 2020-09-12 NOTE — Consult Note (Signed)
ANTICOAGULATION CONSULT NOTE - Consult  Pharmacy Consult for Warfarin dosing Indication: Mitral Valve Repair with Atrial Fibrillation  Allergies  Allergen Reactions  . Cephalexin Rash  . Latex Rash  . Morphine Nausea And Vomiting  . Tizanidine Palpitations  . Liothyronine Other (See Comments)  . Other Other (See Comments)    Several different statins.  . Statins Other (See Comments)    Several different statins.  . Tape Other (See Comments)    Breaks her skin Allergic to plastic/latex tape. Only use paper tape on patient. Breaks her skin: Allergic to plastic/latex tape. Only use paper tape on patient.  . Tapentadol Other (See Comments)  . Sulfa Antibiotics Rash and Other (See Comments)    Patient Measurements: Height: 5\' 9"  (175.3 cm) Weight: 76.2 kg (167 lb 15.9 oz) IBW/kg (Calculated) : 66.2  Vital Signs: Temp: 98.3 F (36.8 C) (12/18 0817) Temp Source: Oral (12/18 0817) BP: 144/68 (12/18 0817) Pulse Rate: 71 (12/18 0817)  Labs: Recent Labs    09/10/20 0301 09/10/20 0514 09/10/20 0934 09/11/20 0825 09/11/20 1651 09/12/20 0520  HGB 11.7* 10.3*  --  12.5  --   --   HCT 38.0 33.1*  --   --   --   --   PLT 224 202  --   --   --   --   LABPROT 19.5*  --   --   --  16.8* 17.9*  INR 1.7*  --   --   --  1.4* 1.5*  CREATININE 1.25* 1.08*  --   --   --   --   TROPONINIHS  --  38* 38*  --   --   --     Estimated Creatinine Clearance: 46.3 mL/min (A) (by C-G formula based on SCr of 1.08 mg/dL (H)).   Medications:  No active ordered interacting medications. Home ropinirole 2mg  dose with stable INRs in outpatient setting. No pertinent AC related drug allergies.  Assessment: Mitral Valve Repair (tissue) with Afib. PTA warfarin dosing was 3mg  Tue/Sat & 2.5mg  All other days. Last reported INR goal was 2-3. (Historically had tried management with Xarelto and Eliquis before coming back to warfarin.)  Date INR Dose Comment 12/16 1.7 -- Held ISO of  BRBPR 12/17 1.4 3.5mg  (~20% inc once) 12/18 1.5 3 mg  Goal of Therapy:  INR 2-3 Monitor platelets by anticoagulation protocol: Yes   Plan:  INR is subtherapeutic. Will give warfarin 3 mg (home dose) x 1. Daily INR ordered. CBC at least every 3 days.    Rhonda Griffith S [name] 09/12/2020,10:16 AM

## 2020-09-12 NOTE — Discharge Summary (Signed)
[facility] [place] at [facility]   PATIENT NAME: Rhonda Griffith    MR#:  [id number]  DATE OF BIRTH:  1945-11-01  DATE OF ADMISSION:  09/10/2020 ADMITTING PHYSICIAN: Amy N Cox, DO  DATE OF DISCHARGE: 09/12/2020  PRIMARY CARE PHYSICIAN: [name], MD    ADMISSION DIAGNOSIS:  Acute GI bleeding [K92.2] BRBPR (bright red blood per rectum) [K62.5] Current use of long term anticoagulation [Z79.01] H/O mitral valve replacement [Z95.2] Urinary tract infection without hematuria, site unspecified [N39.0]  DISCHARGE DIAGNOSIS:  Rectal bleed--resolved   SECONDARY DIAGNOSIS:   Past Medical History:  Diagnosis Date  . Atrial fibrillation, permanent (HCC)    a. CHA2DS2VASc = 4-->coumadin;  b. 09/2014 Echo: EF 55-60%, normal wall motion, mild AI/AS, nl MV, mod dil LA, mildly dil RA, mild to mod TR, PASP .  . CHF (congestive heart failure) (HCC)   . Chronic kidney disease, stage I   . CVA (cerebral infarction)    a. 04/2015 - anticoagulation switched from eliquis to xarelto to coumadin.  [name] Hypertension   . Leukocytosis   . Pacemaker -Medtronic    a. implant for SSS  . PAD (peripheral artery disease) (HCC)    a. w/ left lower ext claudication s/p ABI's 04/2015 showing nl ABI on Right with abnl waveforms on left sugg of L SFA dzs.  [name] Restless leg syndrome   . 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    HOSPITAL COURSE:   Rhonda Ranker Schubertis a 74 y.o.femalewith medical history significant formitralvalve repair on warfarin,atrial fibrillation, anxiety, primary hypertension, history of chronic diastolic heart failure, presented to the emergency department for chief concerns of bright red blood per rectum while using the restroom.  Bright red blood per rectum in setting of chronic anticoagulation use with warfarin -Confirmed by ED provider via Hemoccult -H&H is  stable at 12.5 -No signs of active bleeding -Protonix 40 mg IV twice daily--change to po PPI -G.I consult withDR wohl appreciated.  --colonoscopy--Preparation of the colon was fair.                        - Non-bleeding internal hemorrhoids.                        - No specimens collected.  History of mitral valve repair-on warfarin Atrial fibrillation -Subtherapeutic INR -since no active bleed warfarin was resumed. G.I. aware. -- Daughter has been informed to get PT/INR check next Monday or Tuesday. Her home dosing of Coumadin has been resumed.  Pyuria - no UTI symptoms, ED provider ordered one time dose of fosfomycin  Primary hypertension-controlled, resumed home diltiazem 120 mg daily  Restless leg syndrome-resumed home Requip nightly  generalized weakness. Patient uses walker at home. She recent fall while walking out of the curbside.  --physical therapy evaluation appreciated recommends home health PT with supervision. Patient lives with her daughter and husband who helps her out. According to daughter patient has been unsteady for some time. I did give an option to consider rehab however daughter declined. Will arrange home health PT. Patient will discharged to home.  DVT prophylaxis:warfarin Code Status:DNR Family Communication:dter Lupita Leash on the phone today Disposition Plan:home with home health Consults called:G.I. Admission status: Inpateint    discharge patient to home today CONSULTS OBTAINED:  Treatment Team:  Midge Minium, MD  DRUG ALLERGIES:   Allergies  Allergen Reactions  . Cephalexin Rash  . Latex Rash  . Morphine Nausea And Vomiting  . Tizanidine Palpitations  . Liothyronine Other (See Comments)  . Other Other (See Comments)    Several different statins.  . Statins Other (See Comments)    Several different statins.  . Tape Other (See Comments)    Breaks her skin Allergic to plastic/latex tape. Only use paper tape on patient. Breaks her  skin: Allergic to plastic/latex tape. Only use paper tape on patient.  . Tapentadol Other (See Comments)  . Sulfa Antibiotics Rash and Other (See Comments)    DISCHARGE MEDICATIONS:   Allergies as of 09/12/2020      Reactions   Cephalexin Rash   Latex Rash   Morphine Nausea And Vomiting   Tizanidine Palpitations   Liothyronine Other (See Comments)   Other Other (See Comments)   Several different statins.   Statins Other (See Comments)   Several different statins.   Tape Other (See Comments)   Breaks her skin Allergic to plastic/latex tape. Only use paper tape on patient. Breaks her skin: Allergic to plastic/latex tape. Only use paper tape on patient.   Tapentadol Other (See Comments)   Sulfa Antibiotics Rash, Other (See Comments)      Medication List    TAKE these medications   acetaminophen 650 MG CR tablet Commonly known as: TYLENOL Take 650 mg by mouth every 8 (eight) hours as needed for pain.   alendronate 70 MG tablet Commonly known as: FOSAMAX Take 70 mg by mouth once a week.   ascorbic acid 500 MG tablet Commonly known as: VITAMIN C Take 500 mg by mouth daily.   atorvastatin 80 MG tablet Commonly known as: LIPITOR Take 80 mg by mouth every evening.   clobetasol cream 0.05 % Commonly known as: TEMOVATE Apply 1 application topically 2 (two) times daily.   cyclobenzaprine 5 MG tablet Commonly known as: FLEXERIL Take 5 mg by mouth 2 (two) times daily as needed for muscle spasms.   diltiazem 120 MG 24 hr capsule Commonly known as: CARDIZEM CD Take 1 capsule (120 mg total) by mouth daily.   docusate sodium 100 MG capsule Commonly known as: COLACE Take 100 mg by mouth daily as needed for mild constipation.   DULoxetine 60 MG capsule Commonly known as: CYMBALTA Take 60 mg by mouth daily.   FLUoxetine 20 MG capsule Commonly known as: PROZAC Take 20 mg by mouth daily.   fluticasone 50 MCG/ACT nasal spray Commonly known as: FLONASE 2 sprays by Each  Nare route daily as needed for allergies.   gabapentin 100 MG capsule Commonly known as: NEURONTIN Take 100 mg by mouth 3 (three) times daily.   guaiFENesin 600 MG 12 hr tablet Commonly known as: MUCINEX Take 600 mg by mouth 2 (two) times daily as needed for cough or to loosen phlegm.   magnesium oxide 400 MG tablet Commonly known as: MAG-OX Take 400 mg by mouth daily.   Oyster Shell Calcium 500-400 MG-UNIT Tabs Take 1 tablet by mouth 2 (two) times daily.   pantoprazole 40 MG tablet Commonly known as: PROTONIX Take 40 mg by mouth daily.   rOPINIRole 2 MG tablet Commonly known as: REQUIP Take 2 mg by mouth at bedtime.   senna 8.6 MG tablet Commonly known as: SENOKOT Take 1 tablet by mouth as needed for constipation.   Vitamin D3 25 MCG (1000 UT) Caps Take 2,000 Units by mouth every 30 (thirty) days.   warfarin 2.5 MG  tablet Commonly known as: COUMADIN Take 2.5 mg by mouth as directed. Take on Monday, Wednesday, Thursday, Friday, Sunday   warfarin 3 MG tablet Commonly known as: COUMADIN Take 3 mg by mouth as directed. Take on Tuesday and Saturday       If you experience worsening of your admission symptoms, develop shortness of breath, life threatening emergency, suicidal or homicidal thoughts you must seek medical attention immediately by calling 911 or calling your MD immediately  if symptoms less severe.  You Must read complete instructions/literature along with all the possible adverse reactions/side effects for all the Medicines you take and that have been prescribed to you. Take any new Medicines after you have completely understood and accept all the possible adverse reactions/side effects.   Please note  You were cared for by a hospitalist during your hospital stay. If you have any questions about your discharge medications or the care you received while you were in the hospital after you are discharged, you can call the unit and asked to speak with the  hospitalist on call if the hospitalist that took care of you is not available. Once you are discharged, your primary care physician will handle any further medical issues. Please note that NO REFILLS for any discharge medications will be authorized once you are discharged, as it is imperative that you return to your primary care physician (or establish a relationship with a primary care physician if you do not have one) for your aftercare needs so that they can reassess your need for medications and monitor your lab values. Today   SUBJECTIVE    No new complaints. No rectal bleeding reported. VITAL SIGNS:  Blood pressure (!) 126/51, pulse 65, temperature 98.4 F (36.9 C), temperature source Oral, resp. rate 18, height 5\' 9"  (1.753 m), weight 76.2 kg, SpO2 96 %.  I/O:    Intake/Output Summary (Last 24 hours) at 09/12/2020 1243 Last data filed at 09/11/2020 2345 Gross per 24 hour  Intake 300 ml  Output 900 ml  Net -600 ml    PHYSICAL EXAMINATION:  GENERAL:  74 y.o.-year-old patient lying in the bed with no acute distress.  LUNGS: Normal breath sounds bilaterally, no wheezing, rales,rhonchi or crepitation. No use of accessory muscles of respiration.  CARDIOVASCULAR: S1, S2 normal. No murmurs, rubs, or gallops.  ABDOMEN: Soft, non-tender, non-distended. Bowel sounds present. No organomegaly or mass.  EXTREMITIES: No pedal edema, cyanosis, or clubbing.  NEUROLOGIC: Cranial nerves II through XII are intact. Muscle strength 5/5 in all extremities. Sensation intact. Gait not checked. weak PSYCHIATRIC: The patient is alert and oriented x 3.  SKIN: No obvious rash, lesion, or ulcer.   DATA REVIEW:   CBC  Recent Labs  Lab 09/10/20 0514 09/11/20 0825  WBC 7.4  --   HGB 10.3* 12.5  HCT 33.1*  --   PLT 202  --     Chemistries  Recent Labs  Lab 09/10/20 0301 09/10/20 0514  NA 141 139  K 4.6 4.1  CL 107 108  CO2 25 24  GLUCOSE 96 89  BUN 22 21  CREATININE 1.25* 1.08*  CALCIUM  10.0 9.5  AST 19  --   ALT 15  --   ALKPHOS 64  --   BILITOT 0.7  --     Microbiology Results   Recent Results (from the past 240 hour(s))  Urine Culture     Status: None   Collection Time: 09/10/20  3:39 AM   Specimen: Urine, Random  Result Value Ref Range Status   Specimen Description   Final    URINE, RANDOM Performed at [facility], [address] [place], [place] [address]    Special Requests   Final    Normal Performed at [facility], [address] [place], [place] [address]    Culture   Final    NO GROWTH Performed at [facility], [address], [place], [place] [address]    Report Status 09/11/2020 FINAL  Final    RADIOLOGY:  NM GI Blood Loss  Result Date: 09/10/2020 CLINICAL DATA:  Right red blood per rectum last night in this morning, on anticoagulation with warfarin. EXAM: NUCLEAR MEDICINE GASTROINTESTINAL BLEEDING SCAN TECHNIQUE: Sequential abdominal images were obtained following intravenous administration of Tc-77m labeled red blood cells. RADIOPHARMACEUTICALS:  20.4 mCi Tc-68m pertechnetate in-vitro labeled red cells. COMPARISON:  09/10/2015 CT pelvis. FINDINGS: No scintigraphic evidence of active GI bleed in the abdomen or pelvis over 2 hours of imaging. Focal radiotracer accumulation in the perineum is favored represent excreted urine accumulating within patient diaper. IMPRESSION: No scintigraphic evidence of active GI bleed. Electronically Signed   By: Delbert Phenix M.D.   On: 09/10/2020 15:11     CODE STATUS:     Code Status Orders  (From admission, onward)         Start     Ordered   09/10/20 0451  Do not attempt resuscitation (DNR)  Continuous       Question Answer Comment  In the event of cardiac or respiratory ARREST Do not call a "code blue"   In the event of cardiac or respiratory ARREST Do not perform Intubation, CPR, defibrillation or ACLS   In the event of cardiac or respiratory ARREST Use medication by any  route, position, wound care, and other measures to relive pain and suffering. May use oxygen, suction and manual treatment of airway obstruction as needed for comfort.      09/10/20 0452        Code Status History    Date Active Date Inactive Code Status Order ID Comments User Context   04/11/2019 1401 04/15/2019 1956 DNR [id number]  Auburn Bilberry, MD Inpatient   04/10/2019 2245 04/11/2019 1401 Full Code [id number]  Pearletha Alfred, NP ED   09/21/2018 1355 09/21/2018 1827 Full Code [id number]  Duke Salvia, MD Inpatient   Advance Care Planning Activity       TOTAL TIME TAKING CARE OF THIS PATIENT: *35* minutes.    Enedina Finner M.D  [facility]    CC: Primary care physician; Shapely-Quinn, Desiree Lucy, MD

## 2020-09-12 NOTE — Evaluation (Signed)
Physical Therapy Evaluation Patient Details Name: Rhonda Griffith MRN: [id number] DOB: 04-11-1946 Today's Date: 09/12/2020   History of Present Illness  74 yo female with onset of rectal bleed was admitted, noted necrotic tissue around L hip joint with chronic pain.  Pt was found to have non bleeding hemorrhoids, will be released home today.  PMHx:  cervical spondylosis, R hemiarthroplasty, mitral valve repair, HTN, CHF, a-fib, pulm HTN, RLS  Clinical Impression  Pt was seen for mobility on RW with help to stand and then to step up on portable steps.  Pt is precarious initially and then more proficient as she walked on RW and then worked on stairs.  Limited her excursion from the bed due to L hip pain and initial need for max assist to stand.  Pt is getting up with less help with practice, and will need much PT intervention at home as well as lots of mobility to regain her PLOF.  Talked with MD and pt about rehab stay but family is committed to taking her home and are aware of her recent balance challenges.  Pt has a wheelchair for use as needed as she continues to get stronger.  Follow acutely as her stay allows.      Follow Up Recommendations Home health PT;Supervision for mobility/OOB;Supervision/Assistance - 24 hour    Equipment Recommendations  Rolling walker with 5" wheels (if pt has an older walker)    Recommendations for Other Services       Precautions / Restrictions Precautions Precautions: Fall Precaution Comments: L hip has necrotic soft tissue around joint Restrictions Weight Bearing Restrictions: No      Mobility  Bed Mobility Overal bed mobility: Needs Assistance Bed Mobility: Supine to Sit;Sit to Supine     Supine to sit: Min assist;Mod assist Sit to supine: Min assist        Transfers Overall transfer level: Needs assistance Equipment used: Rolling walker (2 wheeled) Transfers: Sit to/from Stand Sit to Stand: Max assist         General  transfer comment: max initially then on retries closer to mod assist  Ambulation/Gait Ambulation/Gait assistance: Min assist Gait Distance (Feet): 22 Feet (sidesteps on side of bed in two batches) Assistive device: Rolling walker (2 wheeled);1 person hand held assist Gait Pattern/deviations: Step-to pattern;Step-through pattern;Decreased stride length;Wide base of support Gait velocity: reduced Gait velocity interpretation: <1.31 ft/sec, indicative of household ambulator General Gait Details: low transfer of wgt to the L leg, able to accept wgt on it to step down steps  Stairs Stairs: Yes Stairs assistance: Min assist Stair Management: Forwards;Backwards;With walker Number of Stairs: 2 General stair comments: took pt on portable stair as she is feeling weak from being in bed.  Pt could step up x 2 with PT and then back down backward, very surprisingly good performance.  Has gait belt to take home with her  Wheelchair Mobility    Modified Rankin (Stroke Patients Only)       Balance Overall balance assessment: Needs assistance;History of Falls Sitting-balance support: Feet supported;Single extremity supported Sitting balance-Leahy Scale: Fair     Standing balance support: Bilateral upper extremity supported;During functional activity Standing balance-Leahy Scale: Poor Standing balance comment: RW essential but also cues for posture and safety                             Pertinent Vitals/Pain Pain Assessment: Faces Faces Pain Scale: Hurts even more Pain Location: L  hip Pain Descriptors / Indicators: Grimacing;Guarding Pain Intervention(s): Limited activity within patient's tolerance;Monitored during session;Premedicated before session;Repositioned    Home Living Family/patient expects to be discharged to:: Private residence Living Arrangements: Children Available Help at Discharge: Family;Available 24 hours/day Type of Home: House Home Access: Stairs to  enter Entrance Stairs-Rails: Right;Left;Can reach both Entrance Stairs-Number of Steps: 2 Home Layout: One level Home Equipment: Walker - 2 wheels;Wheelchair - Lawyer Comments: pt reports her family is supportive and encourage her to walk as much as tolerated    Prior Function Level of Independence: Independent with assistive device(s)         Comments: has used RW or HHA     Hand Dominance   Dominant Hand: Right    Extremity/Trunk Assessment   Upper Extremity Assessment Upper Extremity Assessment: Generalized weakness    Lower Extremity Assessment Lower Extremity Assessment: Generalized weakness    Cervical / Trunk Assessment Cervical / Trunk Assessment: Kyphotic  Communication   Communication: No difficulties  Cognition Arousal/Alertness: Awake/alert Behavior During Therapy: WFL for tasks assessed/performed Overall Cognitive Status: Within Functional Limits for tasks assessed                                 General Comments: pt is giving accurate history to PT      General Comments General comments (skin integrity, edema, etc.): pt was seen for mobility on RW with cues for maneuvering on side of bed.  Remained at side of bed as pt has been in bed four days, brought portable steps to practice in her room    Exercises     Assessment/Plan    PT Assessment Patient needs continued PT services  PT Problem List Decreased strength;Decreased range of motion;Decreased activity tolerance;Decreased balance;Decreased mobility;Decreased coordination;Decreased knowledge of use of DME       PT Treatment Interventions DME instruction;Gait training;Stair training;Functional mobility training;Therapeutic activities;Therapeutic exercise;Balance training;Neuromuscular re-education;Patient/family education    PT Goals (Current goals can be found in the Care Plan section)  Acute Rehab PT Goals Patient Stated Goal: to get home and get  stronger PT Goal Formulation: With patient Time For Goal Achievement: 09/26/20 Potential to Achieve Goals: Good    Frequency Min 2X/week   Barriers to discharge Inaccessible home environment stairs at every entrance    Co-evaluation               AM-PAC PT "6 Clicks" Mobility  Outcome Measure Help needed turning from your back to your side while in a flat bed without using bedrails?: A Little Help needed moving from lying on your back to sitting on the side of a flat bed without using bedrails?: A Lot Help needed moving to and from a bed to a chair (including a wheelchair)?: A Lot Help needed standing up from a chair using your arms (e.g., wheelchair or bedside chair)?: A Lot Help needed to walk in hospital room?: A Little Help needed climbing 3-5 steps with a railing? : A Little 6 Click Score: 15    End of Session Equipment Utilized During Treatment: Gait belt Activity Tolerance: Patient limited by fatigue;Patient limited by pain Patient left: in bed;with call bell/phone within reach;with bed alarm set Nurse Communication: Mobility status;Other (comment) (discharge planning) PT Visit Diagnosis: Unsteadiness on feet (R26.81);Muscle weakness (generalized) (M62.81);History of falling (Z91.81);Pain Pain - Right/Left: Left Pain - part of body: Hip    Time: 6962-9528 PT Time Calculation (min) (ACUTE  ONLY): 32 min   Charges:   PT Evaluation $PT Eval Moderate Complexity: 1 Mod PT Treatments $Gait Training: 8-22 mins       Ivar Drape 09/12/2020, 12:40 PM  Samul Dada, PT MS Acute Rehab Dept. Number: [facility] [id number] and [facility] [phone]

## 2020-09-12 NOTE — Discharge Instructions (Signed)
Follow-up with your anticoagulation clinic or cardiologist office to get PT/INR checked next Monday or Tuesday

## 2020-09-12 NOTE — Progress Notes (Signed)
Melodie Bouillon, MD [address], [address], [place], [place], [address] [address], [place], [place], [address] Phone: [phone]  Fax: [phone]   Subjective: Patient denies any further episodes of bleeding.  No abdominal pain.  Noted nausea or vomiting   Objective: Exam: Vital signs in last 24 hours: Vitals:   09/11/20 2337 09/12/20 0444 09/12/20 0817 09/12/20 1132  BP: (!) 136/46 (!) 143/49 (!) 144/68 (!) 126/51  Pulse: 63 60 71 65  Resp: 18 18 18 18   Temp: 98.2 F (36.8 C) 97.6 F (36.4 C) 98.3 F (36.8 C) 98.4 F (36.9 C)  TempSrc:  Oral Oral Oral  SpO2: 96% 96% 96% 96%  Weight:      Height:       Weight change:   Intake/Output Summary (Last 24 hours) at 09/12/2020 1221 Last data filed at 09/11/2020 2345 Gross per 24 hour  Intake 300 ml  Output 900 ml  Net -600 ml    General: No acute distress, AAO x3 Abd: Soft, NT/ND, No HSM Skin: Warm, no rashes Neck: Supple, Trachea midline   Lab Results: Lab Results  Component Value Date   WBC 7.4 09/10/2020   HGB 12.5 09/11/2020   HCT 33.1 (L) 09/10/2020   MCV 99.7 09/10/2020   PLT 202 09/10/2020   Micro Results: Recent Results (from the past 240 hour(s))  Urine Culture     Status: None   Collection Time: 09/10/20  3:39 AM   Specimen: Urine, Random  Result Value Ref Range Status   Specimen Description   Final    URINE, RANDOM Performed at [facility] [address], [place], [place]    Special Requests   Final    Normal Performed at [facility], [address], [place], [place]    Culture   Final    NO GROWTH Performed at [facility], [address], [place], [place]    Report Status 09/11/2020 FINAL  Final   Studies/Results: NM GI Blood Loss  Result Date: 09/10/2020 CLINICAL DATA:  Right red blood per rectum last night in this morning, on anticoagulation with warfarin. EXAM: NUCLEAR MEDICINE GASTROINTESTINAL  BLEEDING SCAN TECHNIQUE: Sequential abdominal images were obtained following intravenous administration of Tc-46m labeled red blood cells. RADIOPHARMACEUTICALS:  20.4 mCi Tc-35m pertechnetate in-vitro labeled red cells. COMPARISON:  09/10/2015 CT pelvis. FINDINGS: No scintigraphic evidence of active GI bleed in the abdomen or pelvis over 2 hours of imaging. Focal radiotracer accumulation in the perineum is favored represent excreted urine accumulating within patient diaper. IMPRESSION: No scintigraphic evidence of active GI bleed. Electronically Signed   By: 09/12/2015 M.D.   On: 09/10/2020 15:11   Medications:  Scheduled Meds: . diltiazem  120 mg Oral Daily  . ferrous sulfate  325 mg Oral BID WC  . FLUoxetine  20 mg Oral Daily  . gabapentin  100 mg Oral TID  . pantoprazole  40 mg Oral Daily  . rOPINIRole  2 mg Oral QHS  . warfarin  3 mg Oral Once  . 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 Daleen Squibb yesterday showed nonbleeding internal hemorrhoids, fair prep, otherwise no active bleeding was reported.  Patient tolerating oral diet well Continue to monitor for any signs of active bleeding and repeat CBCs as appropriate   LOS: 1 day   Melodie Bouillon, MD 09/12/2020, 12:21 PM

## 2020-09-14 ENCOUNTER — Encounter: Payer: Self-pay | Admitting: Gastroenterology

## 2020-09-14 NOTE — Progress Notes (Deleted)
Patient ID: Rhonda Griffith, female    DOB: [birth year], 74 y.o.   MRN: [id number]  HPI  Ms Rhonda Griffith is a 74 y/o female with a history of HTN, CKD, atrial fibrillation, mitral/ aortic valve stenosis,  restless leg, CVA, PAD, previous tobacco use and chronic heart failure.   Echo report from 01/10/17 reviewed and showed an EF of 55-60% along with moderate LVH and trivial MR.   Admitted 09/10/20 due to BRBPR. GI consult obtained. Initially given IV protonix and then transitioned to oral medication. Colonoscopy done which showed non-bleeding internal hemorrhoids. Since no active bleeding, warfarin resumed. PT eval done due to weakness. Discharged after 2 days with home PT. Was in the ED 09/09/20 due to mechanical fall. Landed on her buttocks and then hit her head. Xray and CT's showed no acute process and she was released. Was in the ED 04/27/20 due to shortness of breath. IV lasix given with improvement of her symptoms and she was released.   She presents today for a follow-up visit with a chief complaint of   Past Medical History:  Diagnosis Date  . Atrial fibrillation, permanent (HCC)    a. CHA2DS2VASc = 4-->coumadin;  b. 09/2014 Echo: EF 55-60%, normal wall motion, mild AI/AS, nl MV, mod dil LA, mildly dil RA, mild to mod TR, PASP .  . CHF (congestive heart failure) (HCC)   . Chronic kidney disease, stage I   . CVA (cerebral infarction)    a. 04/2015 - anticoagulation switched from eliquis to xarelto to coumadin.  [name] Hypertension   . Leukocytosis   . Pacemaker -Medtronic    a. implant for SSS  . PAD (peripheral artery disease) (HCC)    a. w/ left lower ext claudication s/p ABI's 04/2015 showing nl ABI on Right with abnl waveforms on left sugg of L SFA dzs.  [name] Restless leg syndrome   . 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   Past Surgical History:  Procedure Laterality Date  . APPENDECTOMY    . bladder  injection    . botox bladder    . CHOLECYSTECTOMY    . COLONOSCOPY WITH PROPOFOL N/A 09/11/2020   Procedure: COLONOSCOPY WITH PROPOFOL;  Surgeon: Midge Minium, MD;  Location: [facility] ENDOSCOPY;  Service: Endoscopy;  Laterality: N/A;  . HIP ARTHROPLASTY Right 04/13/2019   Procedure: ARTHROPLASTY BIPOLAR HIP (HEMIARTHROPLASTY);  Surgeon: Donato Heinz, MD;  Location: [facility] ORS;  Service: Orthopedics;  Laterality: Right;  . INSERT / REPLACE / REMOVE PACEMAKER     implant for SSS  . MELANOMA EXCISION     face  . MITRAL VALVE REPLACEMENT     s/p mechanical valve replaced with bovine valve 2010  . NASAL SINUS SURGERY    . PPM GENERATOR CHANGEOUT N/A 09/21/2018   Procedure: PPM GENERATOR CHANGEOUT;  Surgeon: Duke Salvia, MD;  Location: [facility] INVASIVE CV LAB;  Service: Cardiovascular;  Laterality: N/A;  . TONSILLECTOMY     Family History  Family history unknown: Yes   Social History   Tobacco Use  . Smoking status: Former Smoker    Packs/day: 1.00    Years: 5.00    Pack years: 5.00    Types: Cigarettes    Quit date: 12/29/1970    Years since quitting: 49.7  . Smokeless tobacco: Never Used  Substance Use Topics  . Alcohol use: No   Allergies  Allergen Reactions  . Cephalexin Rash  .  Latex Rash  . Morphine Nausea And Vomiting  . Tizanidine Palpitations  . Liothyronine Other (See Comments)  . Other Other (See Comments)    Several different statins.  . Statins Other (See Comments)    Several different statins.  . Tape Other (See Comments)    Breaks her skin Allergic to plastic/latex tape. Only use paper tape on patient. Breaks her skin: Allergic to plastic/latex tape. Only use paper tape on patient.  . Tapentadol Other (See Comments)  . Sulfa Antibiotics Rash and Other (See Comments)     Review of Systems  Constitutional: Positive for fatigue. Negative for appetite change.  HENT: Negative for congestion, postnasal drip and sore throat.   Eyes: Negative.   Respiratory:  Positive for shortness of breath (with moderate exertion). Negative for cough and chest tightness.   Cardiovascular: Positive for leg swelling. Negative for chest pain and palpitations.  Gastrointestinal: Negative for abdominal distention and abdominal pain.  Endocrine: Negative.   Genitourinary: Negative.   Musculoskeletal: Negative for back pain and neck pain.  Skin: Negative.   Allergic/Immunologic: Negative.   Neurological: Negative for dizziness and light-headedness.  Hematological: Negative for adenopathy. Does not bruise/bleed easily.  Psychiatric/Behavioral: Negative for dysphoric mood and sleep disturbance (sleeping on 1 wedge and 1 pillow). The patient is not nervous/anxious.      Physical Exam Vitals and nursing note reviewed.  Constitutional:      Appearance: Normal appearance.  HENT:     Head: Normocephalic and atraumatic.  Cardiovascular:     Rate and Rhythm: Normal rate. Rhythm irregular.  Pulmonary:     Effort: Pulmonary effort is normal. No respiratory distress.     Breath sounds: Wheezing (few expiratory wheezing bilateral upper lobes) present. No rales.  Abdominal:     General: There is no distension.     Palpations: Abdomen is soft.  Musculoskeletal:        General: No tenderness.     Cervical back: Normal range of motion and neck supple.     Right lower leg: Edema (1+ pitting) present.     Left lower leg: Edema (1+ pitting) present.  Skin:    General: Skin is warm and dry.  Neurological:     General: No focal deficit present.     Mental Status: She is alert and oriented to person, place, and time.  Psychiatric:        Mood and Affect: Mood normal.        Behavior: Behavior normal.        Thought Content: Thought content normal.    Assessment & Plan:  1: Chronic heart failure with preserved ejection fraction with structural changes (LVH)- - NYHA class II - euvolemic today - weighing daily; reminded to call for an overnight weight gain of >2 pounds  or a weekly weight gain of >5 pounds - weight 176.6 from last visit here 3 months ago - not adding salt and does read food labels for sodium content; explained that she could have 2000mg  sodium/ day - saw cardiology ) 07/29/20 (may need updated echo scheduled) - instructed to get compression socks and put them on every morning with removal at bedtime - reports receiving both covid vaccines - BNP 04/27/20 was 56.0  2: HTN- - BP - saw PCP ([facility]Qu[facility]) 09/09/20 - BMP 09/10/20 reviewed and showed sodium 139, potassium 4.1, creatinine 1.08 and GFR 53  3: Permanent atrial fibrillation- - pacemaker present - saw EP 09/12/20) 03/31/20 - INR 09/12/20 was 1.5  Medication bottles reviewed.

## 2020-09-15 ENCOUNTER — Ambulatory Visit: Payer: Medicare Other | Admitting: Family

## 2020-09-19 LAB — BPAM RBC
Blood Product Expiration Date: 202112302359
Blood Product Expiration Date: 202201012359
ISSUE DATE / TIME: 202112231421
Unit Type and Rh: 5100
Unit Type and Rh: 5100

## 2020-09-19 LAB — TYPE AND SCREEN
ABO/RH(D): O POS
Antibody Screen: POSITIVE
Unit division: 0
Unit division: 0

## 2020-09-29 ENCOUNTER — Other Ambulatory Visit: Payer: Medicare Other

## 2020-09-29 ENCOUNTER — Ambulatory Visit (INDEPENDENT_AMBULATORY_CARE_PROVIDER_SITE_OTHER): Payer: Medicare Other

## 2020-09-29 DIAGNOSIS — I639 Cerebral infarction, unspecified: Secondary | ICD-10-CM | POA: Diagnosis not present

## 2020-09-29 LAB — CUP PACEART REMOTE DEVICE CHECK
Battery Remaining Longevity: 121 mo
Battery Voltage: 3.02 V
Brady Statistic RV Percent Paced: 98.31 %
Date Time Interrogation Session: 20220104033549
Implantable Lead Implant Date: 20090317
Implantable Lead Location: 753860
Implantable Lead Model: 5076
Implantable Pulse Generator Implant Date: 20191227
Lead Channel Impedance Value: 361 Ohm
Lead Channel Impedance Value: 418 Ohm
Lead Channel Pacing Threshold Amplitude: 0.625 V
Lead Channel Pacing Threshold Pulse Width: 0.4 ms
Lead Channel Sensing Intrinsic Amplitude: 7.5 mV
Lead Channel Sensing Intrinsic Amplitude: 7.5 mV
Lead Channel Setting Pacing Amplitude: 2.5 V
Lead Channel Setting Pacing Pulse Width: 0.4 ms
Lead Channel Setting Sensing Sensitivity: 1.2 mV

## 2020-10-01 ENCOUNTER — Other Ambulatory Visit: Payer: Self-pay

## 2020-10-01 ENCOUNTER — Ambulatory Visit: Payer: Medicare Other | Admitting: Podiatry

## 2020-10-01 ENCOUNTER — Inpatient Hospital Stay
Admission: EM | Admit: 2020-10-01 | Discharge: 2020-10-05 | DRG: 193 | Disposition: A | Discharge status: Skilled Nursing Facility | Payer: Medicare Other | Attending: Internal Medicine | Admitting: Internal Medicine

## 2020-10-01 ENCOUNTER — Emergency Department: Payer: Medicare Other

## 2020-10-01 DIAGNOSIS — Z85828 Personal history of other malignant neoplasm of skin: Secondary | ICD-10-CM

## 2020-10-01 DIAGNOSIS — J9601 Acute respiratory failure with hypoxia: Secondary | ICD-10-CM | POA: Diagnosis present

## 2020-10-01 DIAGNOSIS — I248 Other forms of acute ischemic heart disease: Secondary | ICD-10-CM | POA: Diagnosis present

## 2020-10-01 DIAGNOSIS — Z0184 Encounter for antibody response examination: Secondary | ICD-10-CM

## 2020-10-01 DIAGNOSIS — Z882 Allergy status to sulfonamides status: Secondary | ICD-10-CM

## 2020-10-01 DIAGNOSIS — K219 Gastro-esophageal reflux disease without esophagitis: Secondary | ICD-10-CM | POA: Diagnosis present

## 2020-10-01 DIAGNOSIS — Z950 Presence of cardiac pacemaker: Secondary | ICD-10-CM

## 2020-10-01 DIAGNOSIS — Z952 Presence of prosthetic heart valve: Secondary | ICD-10-CM

## 2020-10-01 DIAGNOSIS — Z885 Allergy status to narcotic agent status: Secondary | ICD-10-CM

## 2020-10-01 DIAGNOSIS — I739 Peripheral vascular disease, unspecified: Secondary | ICD-10-CM | POA: Diagnosis present

## 2020-10-01 DIAGNOSIS — Z79899 Other long term (current) drug therapy: Secondary | ICD-10-CM

## 2020-10-01 DIAGNOSIS — N179 Acute kidney failure, unspecified: Secondary | ICD-10-CM | POA: Diagnosis not present

## 2020-10-01 DIAGNOSIS — Z87891 Personal history of nicotine dependence: Secondary | ICD-10-CM

## 2020-10-01 DIAGNOSIS — D649 Anemia, unspecified: Secondary | ICD-10-CM | POA: Diagnosis present

## 2020-10-01 DIAGNOSIS — N261 Atrophy of kidney (terminal): Secondary | ICD-10-CM | POA: Diagnosis present

## 2020-10-01 DIAGNOSIS — Z888 Allergy status to other drugs, medicaments and biological substances status: Secondary | ICD-10-CM

## 2020-10-01 DIAGNOSIS — G2581 Restless legs syndrome: Secondary | ICD-10-CM | POA: Diagnosis present

## 2020-10-01 DIAGNOSIS — E7800 Pure hypercholesterolemia, unspecified: Secondary | ICD-10-CM | POA: Diagnosis present

## 2020-10-01 DIAGNOSIS — Y95 Nosocomial condition: Secondary | ICD-10-CM | POA: Diagnosis present

## 2020-10-01 DIAGNOSIS — I5032 Chronic diastolic (congestive) heart failure: Secondary | ICD-10-CM | POA: Diagnosis present

## 2020-10-01 DIAGNOSIS — N181 Chronic kidney disease, stage 1: Secondary | ICD-10-CM | POA: Diagnosis present

## 2020-10-01 DIAGNOSIS — Z8673 Personal history of transient ischemic attack (TIA), and cerebral infarction without residual deficits: Secondary | ICD-10-CM

## 2020-10-01 DIAGNOSIS — J189 Pneumonia, unspecified organism: Principal | ICD-10-CM | POA: Diagnosis present

## 2020-10-01 DIAGNOSIS — Z91040 Latex allergy status: Secondary | ICD-10-CM

## 2020-10-01 DIAGNOSIS — G319 Degenerative disease of nervous system, unspecified: Secondary | ICD-10-CM | POA: Diagnosis present

## 2020-10-01 DIAGNOSIS — Z96653 Presence of artificial knee joint, bilateral: Secondary | ICD-10-CM | POA: Diagnosis present

## 2020-10-01 DIAGNOSIS — I4821 Permanent atrial fibrillation: Secondary | ICD-10-CM | POA: Diagnosis present

## 2020-10-01 DIAGNOSIS — R7989 Other specified abnormal findings of blood chemistry: Secondary | ICD-10-CM | POA: Diagnosis present

## 2020-10-01 DIAGNOSIS — R791 Abnormal coagulation profile: Secondary | ICD-10-CM | POA: Diagnosis present

## 2020-10-01 DIAGNOSIS — G4733 Obstructive sleep apnea (adult) (pediatric): Secondary | ICD-10-CM | POA: Diagnosis present

## 2020-10-01 DIAGNOSIS — Z9049 Acquired absence of other specified parts of digestive tract: Secondary | ICD-10-CM

## 2020-10-01 DIAGNOSIS — R778 Other specified abnormalities of plasma proteins: Secondary | ICD-10-CM | POA: Diagnosis present

## 2020-10-01 DIAGNOSIS — I083 Combined rheumatic disorders of mitral, aortic and tricuspid valves: Secondary | ICD-10-CM | POA: Diagnosis present

## 2020-10-01 DIAGNOSIS — Z91048 Other nonmedicinal substance allergy status: Secondary | ICD-10-CM

## 2020-10-01 DIAGNOSIS — Z20822 Contact with and (suspected) exposure to covid-19: Secondary | ICD-10-CM | POA: Diagnosis present

## 2020-10-01 DIAGNOSIS — Z7983 Long term (current) use of bisphosphonates: Secondary | ICD-10-CM

## 2020-10-01 DIAGNOSIS — E785 Hyperlipidemia, unspecified: Secondary | ICD-10-CM | POA: Diagnosis present

## 2020-10-01 DIAGNOSIS — I272 Pulmonary hypertension, unspecified: Secondary | ICD-10-CM | POA: Diagnosis present

## 2020-10-01 DIAGNOSIS — I10 Essential (primary) hypertension: Secondary | ICD-10-CM | POA: Diagnosis present

## 2020-10-01 DIAGNOSIS — Z7901 Long term (current) use of anticoagulants: Secondary | ICD-10-CM

## 2020-10-01 DIAGNOSIS — I130 Hypertensive heart and chronic kidney disease with heart failure and stage 1 through stage 4 chronic kidney disease, or unspecified chronic kidney disease: Secondary | ICD-10-CM | POA: Diagnosis present

## 2020-10-01 DIAGNOSIS — Z8249 Family history of ischemic heart disease and other diseases of the circulatory system: Secondary | ICD-10-CM

## 2020-10-01 LAB — HEPATIC FUNCTION PANEL
ALT: 14 U/L (ref 0–44)
AST: 33 U/L (ref 15–41)
Albumin: 3.5 g/dL (ref 3.5–5.0)
Alkaline Phosphatase: 83 U/L (ref 38–126)
Bilirubin, Direct: 0.4 mg/dL — ABNORMAL HIGH (ref 0.0–0.2)
Indirect Bilirubin: 0.8 mg/dL (ref 0.3–0.9)
Total Bilirubin: 1.2 mg/dL (ref 0.3–1.2)
Total Protein: 7.2 g/dL (ref 6.5–8.1)

## 2020-10-01 LAB — BASIC METABOLIC PANEL
Anion gap: 12 (ref 5–15)
BUN: 45 mg/dL — ABNORMAL HIGH (ref 8–23)
CO2: 23 mmol/L (ref 22–32)
Calcium: 10.1 mg/dL (ref 8.9–10.3)
Chloride: 98 mmol/L (ref 98–111)
Creatinine, Ser: 1.94 mg/dL — ABNORMAL HIGH (ref 0.44–1.00)
GFR, Estimated: 26 mL/min — ABNORMAL LOW (ref >60)
Glucose, Bld: 104 mg/dL — ABNORMAL HIGH (ref 70–99)
Potassium: 4.2 mmol/L (ref 3.5–5.1)
Sodium: 133 mmol/L — ABNORMAL LOW (ref 135–145)

## 2020-10-01 LAB — PROTIME-INR
INR: 3.1 — ABNORMAL HIGH (ref 0.8–1.2)
Prothrombin Time: 30.8 seconds — ABNORMAL HIGH (ref 11.4–15.2)

## 2020-10-01 LAB — TROPONIN I (HIGH SENSITIVITY)
Troponin I (High Sensitivity): 80 ng/L — ABNORMAL HIGH (ref <18)
Troponin I (High Sensitivity): 81 ng/L — ABNORMAL HIGH (ref <18)

## 2020-10-01 LAB — CBC
HCT: 34.8 % — ABNORMAL LOW (ref 36.0–46.0)
Hemoglobin: 10.9 g/dL — ABNORMAL LOW (ref 12.0–15.0)
MCH: 30.5 pg (ref 26.0–34.0)
MCHC: 31.3 g/dL (ref 30.0–36.0)
MCV: 97.5 fL (ref 80.0–100.0)
Platelets: 257 10*3/uL (ref 150–400)
RBC: 3.57 MIL/uL — ABNORMAL LOW (ref 3.87–5.11)
RDW: 12.6 % (ref 11.5–15.5)
WBC: 19.2 10*3/uL — ABNORMAL HIGH (ref 4.0–10.5)
nRBC: 0 % (ref 0.0–0.2)

## 2020-10-01 LAB — LACTIC ACID, PLASMA
Lactic Acid, Venous: 1.6 mmol/L (ref 0.5–1.9)
Lactic Acid, Venous: 1.5 mmol/L (ref 0.5–1.9)

## 2020-10-01 LAB — POC SARS CORONAVIRUS 2 AG -  ED: SARS Coronavirus 2 Ag: NEGATIVE

## 2020-10-01 LAB — BRAIN NATRIURETIC PEPTIDE: B Natriuretic Peptide: 258.8 pg/mL — ABNORMAL HIGH (ref 0.0–100.0)

## 2020-10-01 LAB — PROCALCITONIN: Procalcitonin: 27.17 ng/mL

## 2020-10-01 MED ORDER — ROPINIROLE HCL 1 MG PO TABS
2.0000 mg | ORAL_TABLET | Freq: Every day | ORAL | Status: DC
  Administered 2020-10-02 – 2020-10-04 (×3): 2 mg via ORAL
  Filled 2020-10-01 (×3): qty 2

## 2020-10-01 MED ORDER — IPRATROPIUM BROMIDE 0.02 % IN SOLN: 0.5000 mg | Freq: Once | RESPIRATORY_TRACT | Status: AC

## 2020-10-01 MED ORDER — ONDANSETRON HCL 4 MG/2ML IJ SOLN: 4.0000 mg | Freq: Four times a day (QID) | INTRAMUSCULAR | Status: DC | PRN

## 2020-10-01 MED ORDER — GABAPENTIN 100 MG PO CAPS
100.0000 mg | ORAL_CAPSULE | Freq: Three times a day (TID) | ORAL | Status: DC
Start: 2020-10-01 — End: 2020-10-05
  Administered 2020-10-02 – 2020-10-05 (×9): 100 mg via ORAL
  Filled 2020-10-01 (×9): qty 1

## 2020-10-01 MED ORDER — MAGNESIUM OXIDE 400 (241.3 MG) MG PO TABS
400.0000 mg | ORAL_TABLET | Freq: Every day | ORAL | Status: DC
  Administered 2020-10-02 – 2020-10-05 (×4): 400 mg via ORAL
  Filled 2020-10-01 (×4): qty 1

## 2020-10-01 MED ORDER — LEVALBUTEROL HCL 1.25 MG/0.5ML IN NEBU
INHALATION_SOLUTION | RESPIRATORY_TRACT | Status: AC
  Administered 2020-10-01: 1.25 mg via RESPIRATORY_TRACT
  Filled 2020-10-01: qty 0.5

## 2020-10-01 MED ORDER — DULOXETINE HCL 30 MG PO CPEP
60.0000 mg | ORAL_CAPSULE | Freq: Every day | ORAL | Status: DC
  Administered 2020-10-02 – 2020-10-05 (×4): 60 mg via ORAL
  Filled 2020-10-01 (×2): qty 1
  Filled 2020-10-01 (×2): qty 2

## 2020-10-01 MED ORDER — IPRATROPIUM BROMIDE 0.02 % IN SOLN
RESPIRATORY_TRACT | Status: AC
  Administered 2020-10-01: 0.5 mg via RESPIRATORY_TRACT
  Filled 2020-10-01: qty 2.5

## 2020-10-01 MED ORDER — AZTREONAM 2 G IJ SOLR: 2.0000 g | Freq: Three times a day (TID) | INTRAMUSCULAR | Status: DC

## 2020-10-01 MED ORDER — VANCOMYCIN HCL 750 MG/150ML IV SOLN: 750.0000 mg | INTRAVENOUS | Status: DC

## 2020-10-01 MED ORDER — PANTOPRAZOLE SODIUM 40 MG PO TBEC
40.0000 mg | DELAYED_RELEASE_TABLET | Freq: Every day | ORAL | Status: DC
  Administered 2020-10-02 – 2020-10-05 (×4): 40 mg via ORAL
  Filled 2020-10-01 (×4): qty 1

## 2020-10-01 MED ORDER — ATORVASTATIN CALCIUM 80 MG PO TABS
80.0000 mg | ORAL_TABLET | Freq: Every evening | ORAL | Status: DC
Start: 2020-10-01 — End: 2020-10-05
  Administered 2020-10-02 – 2020-10-04 (×3): 80 mg via ORAL
  Filled 2020-10-01 (×2): qty 1

## 2020-10-01 MED ORDER — METHYLPREDNISOLONE SODIUM SUCC 125 MG IJ SOLR: 125.0000 mg | Freq: Once | INTRAMUSCULAR | Status: AC

## 2020-10-01 MED ORDER — VANCOMYCIN HCL IN DEXTROSE 1-5 GM/200ML-% IV SOLN: 1000.0000 mg | Freq: Once | INTRAVENOUS | Status: DC

## 2020-10-01 MED ORDER — ASCORBIC ACID 500 MG PO TABS
500.0000 mg | ORAL_TABLET | Freq: Every day | ORAL | Status: DC
  Administered 2020-10-02 – 2020-10-05 (×4): 500 mg via ORAL
  Filled 2020-10-01 (×4): qty 1

## 2020-10-01 MED ORDER — DOCUSATE SODIUM 100 MG PO CAPS: 100.0000 mg | ORAL_CAPSULE | Freq: Every day | ORAL | Status: DC | PRN

## 2020-10-01 MED ORDER — ACETAMINOPHEN 325 MG PO TABS: 650.0000 mg | ORAL_TABLET | Freq: Four times a day (QID) | ORAL | Status: DC | PRN

## 2020-10-01 MED ORDER — ONDANSETRON HCL 4 MG PO TABS: 4.0000 mg | ORAL_TABLET | Freq: Four times a day (QID) | ORAL | Status: DC | PRN

## 2020-10-01 MED ORDER — LORAZEPAM 2 MG/ML IJ SOLN
0.5000 mg | Freq: Once | INTRAMUSCULAR | Status: AC
  Administered 2020-10-01: 0.5 mg via INTRAVENOUS
  Filled 2020-10-01: qty 1

## 2020-10-01 MED ORDER — LACTATED RINGERS IV SOLN: INTRAVENOUS | Status: DC

## 2020-10-01 MED ORDER — METHYLPREDNISOLONE SODIUM SUCC 125 MG IJ SOLR
INTRAMUSCULAR | Status: AC
  Administered 2020-10-01: 125 mg via INTRAVENOUS
  Filled 2020-10-01: qty 2

## 2020-10-01 MED ORDER — SENNA 8.6 MG PO TABS: 1.0000 | ORAL_TABLET | ORAL | Status: DC | PRN

## 2020-10-01 MED ORDER — VITAMIN D3 25 MCG (1000 UNIT) PO TABS
2000.0000 [IU] | ORAL_TABLET | Freq: Every day | ORAL | Status: DC
  Administered 2020-10-02 – 2020-10-05 (×4): 2000 [IU] via ORAL
  Filled 2020-10-01 (×8): qty 2

## 2020-10-01 MED ORDER — VANCOMYCIN HCL 1750 MG/350ML IV SOLN
1750.0000 mg | Freq: Once | INTRAVENOUS | Status: AC
  Administered 2020-10-01: 1750 mg via INTRAVENOUS
  Filled 2020-10-01: qty 350

## 2020-10-01 MED ORDER — MAGNESIUM SULFATE 2 GM/50ML IV SOLN
INTRAVENOUS | Status: AC
  Administered 2020-10-01: 2 g via INTRAVENOUS
  Filled 2020-10-01: qty 50

## 2020-10-01 MED ORDER — CYCLOBENZAPRINE HCL 10 MG PO TABS: 5.0000 mg | ORAL_TABLET | Freq: Two times a day (BID) | ORAL | Status: DC | PRN

## 2020-10-01 MED ORDER — WARFARIN - PHARMACIST DOSING INPATIENT
Freq: Every day | Status: DC
  Filled 2020-10-01: qty 1

## 2020-10-01 MED ORDER — LEVALBUTEROL HCL 1.25 MG/0.5ML IN NEBU: 1.2500 mg | INHALATION_SOLUTION | Freq: Once | RESPIRATORY_TRACT | Status: AC

## 2020-10-01 MED ORDER — MAGNESIUM SULFATE 2 GM/50ML IV SOLN: 2.0000 g | Freq: Once | INTRAVENOUS | Status: AC

## 2020-10-01 MED ORDER — DILTIAZEM HCL ER COATED BEADS 120 MG PO CP24
120.0000 mg | ORAL_CAPSULE | Freq: Every day | ORAL | Status: DC
Start: 2020-10-02 — End: 2020-10-05
  Administered 2020-10-02 – 2020-10-05 (×4): 120 mg via ORAL
  Filled 2020-10-01 (×4): qty 1

## 2020-10-01 MED ORDER — GUAIFENESIN ER 600 MG PO TB12: 600.0000 mg | ORAL_TABLET | Freq: Two times a day (BID) | ORAL | Status: DC | PRN

## 2020-10-01 MED ORDER — ACETAMINOPHEN 650 MG RE SUPP: 650.0000 mg | Freq: Four times a day (QID) | RECTAL | Status: DC | PRN

## 2020-10-01 MED ORDER — CALCIUM CARBONATE-VITAMIN D 500-200 MG-UNIT PO TABS
1.0000 | ORAL_TABLET | Freq: Two times a day (BID) | ORAL | Status: DC
  Administered 2020-10-02 – 2020-10-05 (×7): 1 via ORAL
  Filled 2020-10-01 (×9): qty 1

## 2020-10-01 MED ORDER — SODIUM CHLORIDE 0.9 % IV SOLN
2.0000 g | Freq: Once | INTRAVENOUS | Status: AC
  Administered 2020-10-01: 2 g via INTRAVENOUS
  Filled 2020-10-01: qty 2

## 2020-10-01 NOTE — ED Triage Notes (Signed)
Pt to ED via EMS from home for chief complaint of shob since last night. States has sick family members, neg for covid 87% on RA, placed on 2L [place], 87% on 2L. Increased to 4L Pt speaking in short sentences with labored breathing and tachypneic.   Reports lower right side cp  Denies N/V  Hx COPD, CHF

## 2020-10-01 NOTE — ED Provider Notes (Signed)
[facility] Emergency Department Provider Note ____________________________________________   Event Date/Time   First MD Initiated Contact with Patient 10/01/20 1738     (approximate)  I have reviewed the triage vital signs and the nursing notes.   HISTORY  Chief Complaint Shortness of Breath    HPI Rhonda Griffith is a 75 y.o. female with PMH as noted below including atrial fibrillation, CHF, and peripheral artery disease who presents with shortness of breath, relatively acute onset last night, associated with cough and with right lower chest pain.  She denies any fever or vomiting.  She has no increased leg swelling.  The patient states she is feeling a bit better after being put on oxygen.  Per EMS, she was 87% on room air.  She is not normally on oxygen at home.   Past Medical History:  Diagnosis Date  . Atrial fibrillation, permanent (HCC)    a. CHA2DS2VASc = 4-->coumadin;  b. 09/2014 Echo: EF 55-60%, normal wall motion, mild AI/AS, nl MV, mod dil LA, mildly dil RA, mild to mod TR, PASP .  . CHF (congestive heart failure) (HCC)   . Chronic kidney disease, stage I   . CVA (cerebral infarction)    a. 04/2015 - anticoagulation switched from eliquis to xarelto to coumadin.  [name] Hypertension   . Leukocytosis   . Pacemaker -Medtronic    a. implant for SSS  . PAD (peripheral artery disease) (HCC)    a. w/ left lower ext claudication s/p ABI's 04/2015 showing nl ABI on Right with abnl waveforms on left sugg of L SFA dzs.  [name] Restless leg syndrome   . 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    Patient Active Problem List   Diagnosis Date Noted  . CAP (community acquired pneumonia) 10/01/2020  . AKI (acute kidney injury) (HCC) 10/01/2020  . Elevated troponin 10/01/2020  . Normocytic anemia 10/01/2020  . BRBPR (bright red blood per rectum) 09/10/2020  . Closed right hip  fracture, initial encounter (HCC) 04/10/2019  . Degeneration of lumbar intervertebral disc 02/21/2019  . Lumbar radiculopathy 02/21/2019  . Spinal stenosis of lumbar region 02/21/2019  . Cellulitis 08/30/2018  . Closed fracture of distal end of left fibula 06/28/2018  . Acute respiratory failure with hypoxia (HCC) 10/14/2016  . 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 Influenza A 10/14/2016  . Status post cardiac pacemaker procedure 10/14/2016  . Stroke (HCC) 10/14/2016  . ERRONEOUS ENCOUNTER--DISREGARD 07/03/2016  . Carotid bruit 12/22/2015  . Hyperlipidemia 10/23/2015  . Atrial fibrillation, permanent (HCC)   . Pacemaker -Medtronic   . 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 Chronic kidney disease, unspecified   . Encounter for anticoagulation discussion and counseling 06/25/2015  . Acute cardioembolic stroke (HCC) 06/25/2015  . PAD (peripheral artery disease) (HCC) 06/25/2015  . Pure hypercholesterolemia 04/30/2015  . History of recent stroke 04/28/2015  . Essential hypertension 12/09/2014  . Gait instability 09/08/2014  . Status post total bilateral knee replacement 06/24/2014  . UTI (urinary tract infection) 02/03/2014  . Fatigue 10/01/2013  . Shortness of breath 09/12/2012  . Stress disorder, acute 09/12/2012  . Tachycardia 09/12/2012  . Atrial fibrillation (HCC) 09/12/2012  . Melanoma in situ (HCC) 07/11/2012  . Morphea 07/11/2012  . Long term (current) use of anticoagulants 07/02/2012  . History of malignant melanoma of skin 05/30/2012  . CMC arthritis 02/02/2012  . Sprain of MCL (medial collateral ligament)  of knee 01/24/2012  . Squamous cell carcinoma 12/21/2011  . Carpal tunnel syndrome of left wrist 11/28/2011  . Sinoatrial node dysfunction (HCC)   . Movement disorder 08/08/2011  . Edema 06/07/2011  . Chronic diastolic heart failure (HCC) 05/27/2011  . Anxiety state 05/14/2011  . Esophageal reflux 05/14/2011  . Mitral valve replaced 05/14/2011  . Myalgia and  myositis, unspecified 05/14/2011  . OSA (obstructive sleep apnea) 05/14/2011  . Osteoarthritis 05/14/2011  . Pulmonary hypertension (HCC) 05/14/2011  . Cardiac pacemaker in situ 10/03/2010    Past Surgical History:  Procedure Laterality Date  . APPENDECTOMY    . bladder injection    . botox bladder    . CHOLECYSTECTOMY    . COLONOSCOPY WITH PROPOFOL N/A 09/11/2020   Procedure: COLONOSCOPY WITH PROPOFOL;  Surgeon: Midge Minium, MD;  Location: [facility] ENDOSCOPY;  Service: Endoscopy;  Laterality: N/A;  . HIP ARTHROPLASTY Right 04/13/2019   Procedure: ARTHROPLASTY BIPOLAR HIP (HEMIARTHROPLASTY);  Surgeon: Donato Heinz, MD;  Location: [facility] ORS;  Service: Orthopedics;  Laterality: Right;  . INSERT / REPLACE / REMOVE PACEMAKER     implant for SSS  . MELANOMA EXCISION     face  . MITRAL VALVE REPLACEMENT     s/p mechanical valve replaced with bovine valve 2010  . NASAL SINUS SURGERY    . PPM GENERATOR CHANGEOUT N/A 09/21/2018   Procedure: PPM GENERATOR CHANGEOUT;  Surgeon: Duke Salvia, MD;  Location: [facility] INVASIVE CV LAB;  Service: Cardiovascular;  Laterality: N/A;  . TONSILLECTOMY      Prior to Admission medications   Medication Sig Start Date End Date Taking? Authorizing Provider  acetaminophen (TYLENOL) 650 MG CR tablet Take 650 mg by mouth every 8 (eight) hours as needed for pain.   Yes [provider]  alendronate (FOSAMAX) 70 MG tablet Take 70 mg by mouth once a week.  08/19/19  Yes [provider]  ascorbic acid (VITAMIN C) 500 MG tablet Take 500 mg by mouth daily.   Yes [provider]  atorvastatin (LIPITOR) 80 MG tablet Take 80 mg by mouth every evening.    Yes [provider]  Calcium Carb-Cholecalciferol (OYSTER SHELL CALCIUM) 500-400 MG-UNIT TABS Take 1 tablet by mouth 2 (two) times daily. 07/18/19  Yes [provider]  Cholecalciferol (VITAMIN D3) 1000 UNITS CAPS Take 2,000 Units by mouth every 30 (thirty) days.    Yes [provider]  clobetasol cream (TEMOVATE) 0.05 % Apply 1 application topically 2 (two) times daily.   Yes [provider]  cyclobenzaprine (FLEXERIL) 5 MG tablet Take 5 mg by mouth 2 (two) times daily as needed for muscle spasms. 07/18/19  Yes [provider]  diltiazem (CARDIZEM CD) 120 MG 24 hr capsule Take 1 capsule (120 mg total) by mouth daily. 09/06/16  Yes Antonieta Iba, MD  docusate sodium (COLACE) 100 MG capsule Take 100 mg by mouth daily as needed for mild constipation.   Yes [provider]  DULoxetine (CYMBALTA) 60 MG capsule Take 60 mg by mouth daily.  05/13/19  Yes [provider]  fluticasone (FLONASE) 50 MCG/ACT nasal spray 2 sprays by Each Nare route daily as needed for allergies. 06/27/18  Yes [provider]  gabapentin (NEURONTIN) 100 MG capsule Take 100 mg by mouth 3 (three) times daily. 04/30/19  Yes [provider]  guaiFENesin (MUCINEX) 600 MG 12 hr tablet Take 600 mg by mouth 2 (two) times daily as needed for cough or to loosen phlegm.  Yes [provider]  magnesium oxide (MAG-OX) 400 MG tablet Take 400 mg by mouth daily.  07/30/18  Yes [provider]  pantoprazole (PROTONIX) 40 MG tablet Take 40 mg by mouth daily.    Yes [provider]  rOPINIRole (REQUIP) 2 MG tablet Take 2 mg by mouth at bedtime.  05/29/15  Yes [provider]  senna (SENOKOT) 8.6 MG tablet Take 1 tablet by mouth as needed for constipation. 07/18/19  Yes [provider]  warfarin (COUMADIN) 2.5 MG tablet Take 2.5 mg by mouth as directed. Take on Monday, Wednesday, Thursday, Friday, Sunday   Yes [provider]  warfarin (COUMADIN) 3 MG tablet Take 3 mg by mouth as directed. Take on Tuesday and Saturday   Yes [provider]  FLUoxetine (PROZAC) 20 MG capsule Take 20 mg by mouth daily. 05/01/20   [provider]    Allergies Cephalexin, Latex, Morphine, Tizanidine, Liothyronine,  Other, Statins, Tape, Tapentadol, and Sulfa antibiotics  Family History  Problem Relation Age of Onset  . Hypertension Other     Social History Social History   Tobacco Use  . Smoking status: Former Smoker    Packs/day: 1.00    Years: 5.00    Pack years: 5.00    Types: Cigarettes    Quit date: 12/29/1970    Years since quitting: 49.7  . Smokeless tobacco: Never Used  Vaping Use  . Vaping Use: Never used  Substance Use Topics  . Alcohol use: No  . Drug use: No    Review of Systems  Constitutional: No fever. Eyes: No visual changes. ENT: No sore throat. Cardiovascular: Positive for chest pain. Respiratory: Positive for shortness of breath. Gastrointestinal: No vomiting. Genitourinary: Negative for dysuria.  Musculoskeletal: Negative for back pain. Skin: Negative for rash. Neurological: Negative for headache.   ____________________________________________   PHYSICAL EXAM:  VITAL SIGNS: ED Triage Vitals  Enc Vitals Group     BP 10/01/20 1230 108/78     Pulse Rate 10/01/20 1230 81     Resp 10/01/20 1230 (!) 36     Temp 10/01/20 1230 98.7 F (37.1 C)     Temp Source 10/01/20 1230 Oral     SpO2 10/01/20 1230 93 %     Weight 10/01/20 1339 165 lb (74.8 kg)     Height 10/01/20 1339 5\' 9"  (1.753 m)     Head Circumference --      Peak Flow --      Pain Score 10/01/20 1338 7     Pain Loc --      Pain Edu? --      Excl. in GC? --     Constitutional: Alert and oriented.  Somewhat uncomfortable appearing with increased work of breathing but in no acute distress. Eyes: Conjunctivae are normal.  Head: Atraumatic. Nose: No congestion/rhinnorhea. Mouth/Throat: Mucous membranes are dry.   Neck: Normal range of motion.  Cardiovascular: Normal rate, regular rhythm. Grossly normal heart sounds.  Good peripheral circulation. Respiratory: Increased respiratory effort with some accessory muscle use.  Diminished breath sounds bilaterally with no significant rales or  wheezes. Gastrointestinal: Soft and nontender. No distention.  Genitourinary: No flank tenderness. Musculoskeletal: Trace lower extremity edema.  Extremities warm and well perfused.  Neurologic:  Normal speech and language. No gross focal neurologic deficits are appreciated.  Skin:  Skin is warm and dry. No rash noted. Psychiatric: Mood and affect are normal. Speech and behavior are normal.  ____________________________________________   LABS (all labs  ordered are listed, but only abnormal results are displayed)  Labs Reviewed  BASIC METABOLIC PANEL - Abnormal; Notable for the following components:      Result Value   Sodium 133 (*)    Glucose, Bld 104 (*)    BUN 45 (*)    Creatinine, Ser 1.94 (*)    GFR, Estimated 26 (*)    All other components within normal limits  CBC - Abnormal; Notable for the following components:   WBC 19.2 (*)    RBC 3.57 (*)    Hemoglobin 10.9 (*)    HCT 34.8 (*)    All other components within normal limits  PROTIME-INR - Abnormal; Notable for the following components:   Prothrombin Time 30.8 (*)    INR 3.1 (*)    All other components within normal limits  BRAIN NATRIURETIC PEPTIDE - Abnormal; Notable for the following components:   B Natriuretic Peptide 258.8 (*)    All other components within normal limits  HEPATIC FUNCTION PANEL - Abnormal; Notable for the following components:   Bilirubin, Direct 0.4 (*)    All other components within normal limits  TROPONIN I (HIGH SENSITIVITY) - Abnormal; Notable for the following components:   Troponin I (High Sensitivity) 80 (*)    All other components within normal limits  TROPONIN I (HIGH SENSITIVITY) - Abnormal; Notable for the following components:   Troponin I (High Sensitivity) 81 (*)    All other components within normal limits  CULTURE, BLOOD (ROUTINE X 2)  CULTURE, BLOOD (ROUTINE X 2)  EXPECTORATED SPUTUM ASSESSMENT W REFEX TO RESP CULTURE  PROCALCITONIN  LACTIC ACID, PLASMA  LACTIC ACID,  PLASMA  URINALYSIS, COMPLETE (UACMP) WITH MICROSCOPIC  STREP PNEUMONIAE URINARY ANTIGEN  LEGIONELLA PNEUMOPHILA SEROGP 1 UR AG  PROTIME-INR  BASIC METABOLIC PANEL  CBC WITH DIFFERENTIAL/PLATELET  POC SARS CORONAVIRUS 2 AG -  ED   ____________________________________________  EKG  ED ECG REPORT I, Dionne Bucy, the attending physician, personally viewed and interpreted this ECG.  Date: 10/01/2020 EKG Time: 1346 Rate: 87 Rhythm: Atrial fibrillation QRS Axis: normal Intervals: normal ST/T Wave abnormalities: Nonspecific ST abnormalities Narrative Interpretation: Nonspecific ST abnormalities with no evidence of acute ischemia  ____________________________________________  RADIOLOGY  Chest x-ray interpreted by me shows diffuse bilateral interstitial infiltrates  ____________________________________________   PROCEDURES  Procedure(s) performed: No  Procedures  Critical Care performed: Yes  CRITICAL CARE Performed by: Dionne Bucy   Total critical care time: 30 minutes  Critical care time was exclusive of separately billable procedures and treating other patients.  Critical care was necessary to treat or prevent imminent or life-threatening deterioration.  Critical care was time spent personally by me on the following activities: development of treatment plan with patient and/or surrogate as well as nursing, discussions with consultants, evaluation of patient's response to treatment, examination of patient, obtaining history from patient or surrogate, ordering and performing treatments and interventions, ordering and review of laboratory studies, ordering and review of radiographic studies, pulse oximetry and re-evaluation of patient's condition. ____________________________________________   INITIAL IMPRESSION / ASSESSMENT AND PLAN / ED COURSE  Pertinent labs & imaging results that were available during my care of the patient were reviewed by me and  considered in my medical decision making (see chart for details).  75 year old female with PMH as noted above presents with acute onset of shortness of breath and cough since last night.  I reviewed the past medical records in Epic.  The patient was most recently admitted for a rectal  bleed last month.  She is anticoagulated with warfarin.  She was initially subtherapeutic and warfarin was resumed during hospitalization.  On exam, the patient has increased work of breathing and some accessory muscle use.  O2 saturation is in the high 90s on 2 L by nasal cannula.  She apparently was in the mid to upper 80s on room air.  The other vital signs are normal.  There is no significant peripheral edema.  Exam is otherwise as described above.  Differential includes bacterial pneumonia, COVID-19, influenza or other viral etiology, or infection from other source.  I have a lower suspicion for cardiac etiology.  The patient does have some ST abnormalities on her EKG but minimal chest pain.  We will obtain chest x-ray, lab work-up, continue with supplemental oxygen, and reassess.  I anticipate admission.  ----------------------------------------- 7:28 PM on 10/01/2020 -----------------------------------------  Chest x-ray shows bilateral infiltrates.  The WBC count is significantly elevated.  Overall presentation is most consistent with bacterial pneumonia.  The COVID antigen test is negative.  Broad-spectrum antibiotics have been ordered for presumed HCAP given her recent admission.  I signed the patient out to the hospitalist.  ____________________________  Rhonda Griffith was evaluated in Emergency Department on 10/01/2020 for the symptoms described in the history of present illness. She was evaluated in the context of the global COVID-19 pandemic, which necessitated consideration that the patient might be at risk for infection with the SARS-CoV-2 virus that causes COVID-19. Institutional  protocols and algorithms that pertain to the evaluation of patients at risk for COVID-19 are in a state of rapid change based on information released by regulatory bodies including the CDC and federal and state organizations. These policies and algorithms were followed during the patient's care in the ED.   ____________________________________________   FINAL CLINICAL IMPRESSION(S) / ED DIAGNOSES  Final diagnoses:  HCAP (healthcare-associated pneumonia)      NEW MEDICATIONS STARTED DURING THIS VISIT:  New Prescriptions   No medications on file     Note:  This document was prepared using Dragon voice recognition software and may include unintentional dictation errors.    Arta Silence, MD 10/01/20 2340

## 2020-10-01 NOTE — Progress Notes (Incomplete)
History and Physical    Rhonda Griffith LGX:[id number] DOB: Mar 27, 1946 DOA: 10/01/2020  PCP: [name], MD (Confirm with patient/family/NH records and if not entered, this has to be entered at [facility] point of entry) Patient coming from: ***  I have personally briefly reviewed patient's old medical records in [facility] Link  Chief Complaint: ***  HPI: Rhonda Griffith is a 75 y.o. female with medical history significant of *** (For level 3, the HPI must include 4+ descriptors: Location, Quality, Severity, Duration, Timing, Context, modifying factors, associated signs/symptoms and/or status of 3+ chronic problems.)  (Please avoid self-populating past medical history here) (The initial 2-3 lines should be focused and good to copy and paste in the HPI section of the daily progress note).  ED Course: ***  Review of Systems: As per HPI otherwise all other systems reviewed and are negative. Unacceptable ROS statements: "10 systems reviewed," "Extensive" (without elaboration).  Acceptable ROS statements: "All others negative," "All others reviewed and are negative," and "All others unremarkable," with at LEAST ONE ROS documented Can't double dip - if using for HPI can't use for ROS  Past Medical History:  Diagnosis Date  . Atrial fibrillation, permanent (HCC)    a. CHA2DS2VASc = 4-->coumadin;  b. 09/2014 Echo: EF 55-60%, normal wall motion, mild AI/AS, nl MV, mod dil LA, mildly dil RA, mild to mod TR, PASP .  . CHF (congestive heart failure) (HCC)   . Chronic kidney disease, stage I   . CVA (cerebral infarction)    a. 04/2015 - anticoagulation switched from eliquis to xarelto to coumadin.  [name] Hypertension   . Leukocytosis   . Pacemaker -Medtronic    a. implant for SSS  . PAD (peripheral artery disease) (HCC)    a. w/ left lower ext claudication s/p ABI's 04/2015 showing nl ABI on Right with abnl waveforms on left sugg of L SFA dzs.  [name] Restless leg  syndrome   . 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    Past Surgical History:  Procedure Laterality Date  . APPENDECTOMY    . bladder injection    . botox bladder    . CHOLECYSTECTOMY    . COLONOSCOPY WITH PROPOFOL N/A 09/11/2020   Procedure: COLONOSCOPY WITH PROPOFOL;  Surgeon: Midge Minium, MD;  Location: [facility] ENDOSCOPY;  Service: Endoscopy;  Laterality: N/A;  . HIP ARTHROPLASTY Right 04/13/2019   Procedure: ARTHROPLASTY BIPOLAR HIP (HEMIARTHROPLASTY);  Surgeon: Donato Heinz, MD;  Location: [facility] ORS;  Service: Orthopedics;  Laterality: Right;  . INSERT / REPLACE / REMOVE PACEMAKER     implant for SSS  . MELANOMA EXCISION     face  . MITRAL VALVE REPLACEMENT     s/p mechanical valve replaced with bovine valve 2010  . NASAL SINUS SURGERY    . PPM GENERATOR CHANGEOUT N/A 09/21/2018   Procedure: PPM GENERATOR CHANGEOUT;  Surgeon: Duke Salvia, MD;  Location: [facility] INVASIVE CV LAB;  Service: Cardiovascular;  Laterality: N/A;  . TONSILLECTOMY      Social History  reports that she quit smoking about 49 years ago. Her smoking use included cigarettes. She has a 5.00 pack-year smoking history. She has never used smokeless tobacco. She reports that she does not drink alcohol and does not use drugs.  Allergies  Allergen Reactions  . Cephalexin Rash  . Latex Rash  . Morphine Nausea And Vomiting  . Tizanidine Palpitations  . Liothyronine  Other (See Comments)  . Other Other (See Comments)    Several different statins.  . Statins Other (See Comments)    Several different statins.  . Tape Other (See Comments)    Breaks her skin Allergic to plastic/latex tape. Only use paper tape on patient. Breaks her skin: Allergic to plastic/latex tape. Only use paper tape on patient.  . Tapentadol Other (See Comments)  . Sulfa Antibiotics Rash and Other (See Comments)    Family History  Problem Relation Age of Onset  .  Hypertension Other    *** Unacceptable: Noncontributory, unremarkable, or negative. Acceptable: (example)Family history negative for heart disease  Prior to Admission medications   Medication Sig Start Date End Date Taking? Authorizing Provider  acetaminophen (TYLENOL) 650 MG CR tablet Take 650 mg by mouth every 8 (eight) hours as needed for pain.   Yes [provider]  alendronate (FOSAMAX) 70 MG tablet Take 70 mg by mouth once a week.  08/19/19  Yes [provider]  ascorbic acid (VITAMIN C) 500 MG tablet Take 500 mg by mouth daily.   Yes [provider]  atorvastatin (LIPITOR) 80 MG tablet Take 80 mg by mouth every evening.    Yes [provider]  Calcium Carb-Cholecalciferol (OYSTER SHELL CALCIUM) 500-400 MG-UNIT TABS Take 1 tablet by mouth 2 (two) times daily. 07/18/19  Yes [provider]  Cholecalciferol (VITAMIN D3) 1000 UNITS CAPS Take 2,000 Units by mouth every 30 (thirty) days.    Yes [provider]  clobetasol cream (TEMOVATE) 0.05 % Apply 1 application topically 2 (two) times daily.   Yes [provider]  cyclobenzaprine (FLEXERIL) 5 MG tablet Take 5 mg by mouth 2 (two) times daily as needed for muscle spasms. 07/18/19  Yes [provider]  diltiazem (CARDIZEM CD) 120 MG 24 hr capsule Take 1 capsule (120 mg total) by mouth daily. 09/06/16  Yes Antonieta Iba, MD  docusate sodium (COLACE) 100 MG capsule Take 100 mg by mouth daily as needed for mild constipation.   Yes [provider]  DULoxetine (CYMBALTA) 60 MG capsule Take 60 mg by mouth daily.  05/13/19  Yes [provider]  fluticasone (FLONASE) 50 MCG/ACT nasal spray 2 sprays by Each Nare route daily as needed for allergies. 06/27/18  Yes [provider]  gabapentin (NEURONTIN) 100 MG capsule Take 100 mg by mouth 3 (three) times daily. 04/30/19  Yes [provider]  guaiFENesin (MUCINEX) 600 MG 12 hr tablet Take 600 mg by  mouth 2 (two) times daily as needed for cough or to loosen phlegm.   Yes [provider]  magnesium oxide (MAG-OX) 400 MG tablet Take 400 mg by mouth daily.  07/30/18  Yes [provider]  pantoprazole (PROTONIX) 40 MG tablet Take 40 mg by mouth daily.    Yes [provider]  rOPINIRole (REQUIP) 2 MG tablet Take 2 mg by mouth at bedtime.  05/29/15  Yes [provider]  senna (SENOKOT) 8.6 MG tablet Take 1 tablet by mouth as needed for constipation. 07/18/19  Yes [provider]  warfarin (COUMADIN) 2.5 MG tablet Take 2.5 mg by mouth as directed. Take on Monday, Wednesday, Thursday, Friday, Sunday   Yes [provider]  warfarin (COUMADIN) 3 MG tablet Take 3 mg by mouth as directed. Take on Tuesday and Saturday   Yes [provider]  FLUoxetine (PROZAC) 20 MG capsule Take 20 mg by mouth daily. 05/01/20   [provider]  Physical Exam: Vitals:   10/01/20 2230 10/01/20 2245 10/01/20 2300 10/01/20 2330  BP: (!) 152/59  (!) 164/124 (!) 137/56  Pulse: 80 77 95 90  Resp: (!) 40 (!) 36 (!) 35 (!) 34  Temp:      TempSrc:      SpO2: 94% 92% 92% 94%  Weight:      Height:        Constitutional: NAD, calm, comfortable Vitals:   10/01/20 2230 10/01/20 2245 10/01/20 2300 10/01/20 2330  BP: (!) 152/59  (!) 164/124 (!) 137/56  Pulse: 80 77 95 90  Resp: (!) 40 (!) 36 (!) 35 (!) 34  Temp:      TempSrc:      SpO2: 94% 92% 92% 94%  Weight:      Height:       Eyes: PERRL, lids and conjunctivae normal ENMT: Mucous membranes are moist. Posterior pharynx clear of any exudate or lesions.Normal dentition.  Neck: normal, supple, no masses, no thyromegaly Respiratory: clear to auscultation bilaterally, no wheezing, no crackles. Normal respiratory effort. No accessory muscle use.  Cardiovascular: Regular rate and rhythm, no murmurs / rubs / gallops. No extremity edema. 2+ pedal pulses. No carotid bruits.  Abdomen: no tenderness, no  masses palpated. No hepatosplenomegaly. Bowel sounds positive.  Musculoskeletal: no clubbing / cyanosis. No joint deformity upper and lower extremities. Good ROM, no contractures. Normal muscle tone.  Skin: no rashes, lesions, ulcers. No induration Neurologic: CN 2-12 grossly intact. Sensation intact, DTR normal. Strength 5/5 in all 4.  Psychiatric: Normal judgment and insight. Alert and oriented x 3. Normal mood.   (Anything < 9 systems with 2 bullets each down codes to level 1) (If patient refuses exam can't bill higher level) (Make sure to document decubitus ulcers present on admission -- if possible -- and whether patient has chronic indwelling catheter at time of admission)  Labs on Admission: I have personally reviewed following labs and imaging studies  CBC: Recent Labs  Lab 10/01/20 1355  WBC 19.2*  HGB 10.9*  HCT 34.8*  MCV 97.5  PLT 257    Basic Metabolic Panel: Recent Labs  Lab 10/01/20 1355  NA 133*  K 4.2  CL 98  CO2 23  GLUCOSE 104*  BUN 45*  CREATININE 1.94*  CALCIUM 10.1    GFR: Estimated Creatinine Clearance: 25.8 mL/min (A) (by C-G formula based on SCr of 1.94 mg/dL (H)).  Liver Function Tests: Recent Labs  Lab 10/01/20 1601  AST 33  ALT 14  ALKPHOS 83  BILITOT 1.2  PROT 7.2  ALBUMIN 3.5    Urine analysis:    Component Value Date/Time   COLORURINE YELLOW (A) 09/10/2020 0339   APPEARANCEUR HAZY (A) 09/10/2020 0339   LABSPEC 1.016 09/10/2020 0339   PHURINE 5.0 09/10/2020 0339   GLUCOSEU NEGATIVE 09/10/2020 0339   HGBUR SMALL (A) 09/10/2020 0339   BILIRUBINUR NEGATIVE 09/10/2020 0339   KETONESUR NEGATIVE 09/10/2020 0339   PROTEINUR NEGATIVE 09/10/2020 0339   NITRITE POSITIVE (A) 09/10/2020 0339   LEUKOCYTESUR SMALL (A) 09/10/2020 0339    Radiological Exams on Admission: DG Chest 2 View  Result Date: 10/01/2020 CLINICAL DATA:  Shortness of breath. EXAM: CHEST - 2 VIEW COMPARISON:  04/27/2020. FINDINGS: Cardiac pacer in stable  position. Prior median sternotomy and cardiac valve replacement. Cardiomegaly. Low lung volumes. Diffuse bilateral interstitial prominence with prominent alveolar infiltrates/edema right lung. CHF and/or pneumonia could present this fashion. Small right pleural effusion. No pneumothorax. Degenerative changes scoliosis thoracic spine.  Surgical clips right upper quadrant. IMPRESSION: 1. Cardiac pacer in stable position. Prior median sternotomy and cardiac valve replacement. Cardiomegaly. 2. Low lung volumes. Diffuse bilateral interstitial prominence with prominent alveolar infiltrates/edema right lung. CHF and/or pneumonia could present in this fashion. Electronically Signed   By: Marcello Moores  Register   On: 10/01/2020 14:14    EKG: Independently reviewed. Vent. rate 87 BPM PR interval * ms QRS duration 100 ms QT/QTc 340/409 ms P-R-T axes * 86 -74 Undetermined rhythm Marked ST abnormality, possible inferior subendocardial injury Abnormal ECG  Assessment/Plan Principal Problem:   CAP (community acquired pneumonia) Active Problems:   AKI (acute kidney injury) (HCC)   Chronic diastolic heart failure (HCC)   Essential hypertension   Permanent atrial fibrillation (HCC)   Hyperlipidemia   Esophageal reflux   OSA (obstructive sleep apnea)   Pulmonary hypertension (HCC)    Elevated troponin   Normocytic anemia    DVT prophylaxis: On warfarin per pharmacy. Code Status:   Full code. Family Communication: Disposition Plan:   Patient is from:  Home.  Anticipated DC to:  Home.  Anticipated DC date:  10/03/2020.  Anticipated DC barriers: Clinical status.  Consults called: Admission status:  Observation/stepdown.   Severity of Illness: The patient will need to remain in the hospital for IV antibiotic infusion and respiratory therapy support, currently requiring BiPAP ventilation mode.  Reubin Milan MD [facility]  How to contact the [facility] Attending or Consulting provider [place]  or covering provider during after hours [place], for this patient?   1. Check the care team in [facility] and look for a) attending/consulting [facility] provider listed and b) the [facility] team listed 2. Log into [email or web address] and use [place]'s universal password to access. If you do not have the password, please contact the hospital operator. 3. Locate the [facility] provider you are looking for under [facility] and page to a number that you can be directly reached. 4. If you still have difficulty reaching the provider, please page the [facility] (Director on Call) for the Hospitalists listed on amion for assistance.  10/01/2020, 11:41 PM   This document was prepared using Dragon voice recognition software and may contain some unintended transcription errors.

## 2020-10-01 NOTE — Consult Note (Signed)
PHARMACY -  BRIEF ANTIBIOTIC NOTE   Pharmacy has received consult(s) for vancomycin and aztreonam from an ED provider.  The patient's profile has been reviewed for ht/wt/allergies/indication/available labs.    One time order(s) placed by MD for aztreonam 2g x 1 and vancomycin 1750 mg x 1   Further antibiotics/pharmacy consults should be ordered by admitting physician if indicated.                       Thank you, Reatha Armour, PharmD Pharmacy Resident  10/01/2020 7:02 PM

## 2020-10-01 NOTE — Consult Note (Signed)
ANTICOAGULATION CONSULT NOTE   Pharmacy Consult for Warfarin Indication: atrial fibrillation  Allergies  Allergen Reactions  . Cephalexin Rash  . Latex Rash  . Morphine Nausea And Vomiting  . Tizanidine Palpitations  . Liothyronine Other (See Comments)  . Other Other (See Comments)    Several different statins.  . Statins Other (See Comments)    Several different statins.  . Tape Other (See Comments)    Breaks her skin Allergic to plastic/latex tape. Only use paper tape on patient. Breaks her skin: Allergic to plastic/latex tape. Only use paper tape on patient.  . Tapentadol Other (See Comments)  . Sulfa Antibiotics Rash and Other (See Comments)    Patient Measurements: Height: 5\' 9"  (175.3 cm) Weight: 74.8 kg (165 lb) IBW/kg (Calculated) : 66.2  Vital Signs: Temp: 98.5 F (36.9 C) (01/06 1537) Temp Source: Oral (01/06 1230) BP: 138/121 (01/06 1930) Pulse Rate: 96 (01/06 1945)  Labs: Recent Labs    10/01/20 1355 10/01/20 1601  HGB 10.9*  --   HCT 34.8*  --   PLT 257  --   LABPROT 30.8*  --   INR 3.1*  --   CREATININE 1.94*  --   TROPONINIHS 80* 81*    Estimated Creatinine Clearance: 25.8 mL/min (A) (by C-G formula based on SCr of 1.94 mg/dL (H)).   Medical History: Past Medical History:  Diagnosis Date  . Atrial fibrillation, permanent (HCC)    a. CHA2DS2VASc = 4-->coumadin;  b. 09/2014 Echo: EF 55-60%, normal wall motion, mild AI/AS, nl MV, mod dil LA, mildly dil RA, mild to mod TR, PASP 10/2014.  . CHF (congestive heart failure) (HCC)   . Chronic kidney disease, stage I   . CVA (cerebral infarction)    a. 04/2015 - anticoagulation switched from eliquis to xarelto to coumadin.  05/2015 Hypertension   . Leukocytosis   . Pacemaker -Medtronic    a. implant for SSS  . PAD (peripheral artery disease) (HCC)    a. w/ left lower ext claudication s/p ABI's 04/2015 showing nl ABI on Right with abnl waveforms on left sugg of L SFA dzs.  05/2015 Restless leg syndrome   .  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    Medications:  Warfarin home regimen: 3 mg Tues,Sat; 2.5 mg M-W-Th-F,Sun (total weekly dose = 18.5 mg)  Assessment: 75 y.o. female with history of atrial fibrillation with mitral valve repair on warfarin (INR 2-3) and GIB. Presented to ED with SOB with a associated cough and right lower chest pain. INR 3.1 (slightly supratherapeutic) on admission. Pharmacy consulted to dose warfarin while while inpatient.  Patient follows with anticoagulation clinic outpatient. Current home regimen obtained from most recent coag clinic visit and recent hospital discharge.  DATE INR WAFARIN DOSE 1/6 3.1 held   Goal of Therapy:  INR 2-3 Monitor platelets by anticoagulation protocol: Yes   Plan:   Given supratherapeutic INR on admission, will omit tonight's dose  Recheck INR with morning labs  Continue to follow H&H   71, PharmD, BCPS Clinical Pharmacist  10/01/2020,7:54 PM

## 2020-10-01 NOTE — ED Notes (Signed)
Report off to stephen rn 

## 2020-10-01 NOTE — ED Notes (Signed)
While this RN interviewed pt her eyes began to roll back in her head. O2 sat 99% 2 L. MD came to bedside to assess pt

## 2020-10-01 NOTE — ED Triage Notes (Signed)
First RN Note: Pt BIB [facility] with c/o increased weakness x 3-4 days. Per EMS pt with exertional SOB more so than normal. Per EMS pt with 1 breathing tx PTA and seemed to help. Per EMS pt initial O2 91% on RA placed on 2L via [place] and O2 sats increased.

## 2020-10-01 NOTE — H&P (Signed)
History and Physical    Sinead Hockman WGN:[id number] DOB: [birth year] DOA: 10/01/2020  PCP: Cecile Sheerer, MD   Patient coming from: Home.  I have personally briefly reviewed patient's old medical records in [place]  Chief Complaint: Shortness of breath.  HPI: Rhonda Griffith is a 75 y.o. 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  EMS on arrival to her home stated that her O2 sat was 87% on room air.  They placed her on nasal cannula oxygen and gave her a nebulizer treatment.  She denies fever, but complains of chills and fatigue.  Her appetite has decreased.  No rhinorrhea, sore throat, or hemoptysis.  No precordial chest pain, palpitations, diaphoresis, PND, states she occasionally gets lightheaded and occasionally gets lower extremity edema.  Denies abdominal pain, nausea, emesis, diarrhea, constipation, melena or hematochezia.  No dysuria, frequency or hematuria.  No polyuria, polydipsia, polyphagia or blurred vision.  ED Course: Initial vital signs were: 10/01/20 1230 98.7 F (37.1 C) 81 -- 36Abnormal 108/78 Sitting 93 % Room Air   ED treatment: The patient received vancomycin aztreonam.  I added a single dose of methylprednisolone 125 mg IVP, bronchodilators and BiPAP as needed.  She was subsequently placed on BiPAP ventilation mode and given lorazepam 0.5 mg IVP x1 dose.  Labwork: CBC showed a white count of 19.2, hemoglobin  10.9 g/dL platelets two hundred fifty-seven.  PT was 30.8 and INR 3.1.  Troponin was eighty and then 81 ng/mL.  BNP was 258.8 pg/mL.  BMP showed a sodium of 133 mmol/L.  The rest of the electrolytes are normal.  Glucose one hundred and four, BUN 45 and creatinine 1.94 mg/dL.  Lactic acid was normal twice.  LFTs were unremarkable.  Imaging: Chest radiograph shows RLL airspace disease.  Please see images were regular report for further detail.  Review of Systems: As per HPI otherwise all other systems reviewed and are negative.  Past Medical History:  Diagnosis Date  . Atrial fibrillation, permanent ([place])    a. CHA2DS2VASc = 4-->coumadin;  b. 09/2014 Echo: EF 55-60%, normal wall motion, mild AI/AS, nl MV, mod dil LA, mildly dil RA, mild to mod TR, PASP 57mmHg.  . CHF (congestive heart failure) ([place])   . Chronic kidney disease, stage I   . CVA (cerebral infarction)    a. 04/2015 - anticoagulation switched from eliquis to xarelto to coumadin.  [name] Hypertension   . Leukocytosis   . Pacemaker -Medtronic    a. implant for SSS  . PAD (peripheral artery disease) ([place])    a. w/ left lower ext claudication s/p ABI's 04/2015 showing nl ABI on Right with abnl waveforms on left sugg of L SFA dzs.  [name] Restless leg syndrome   . 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    Past Surgical History:  Procedure Laterality Date  . APPENDECTOMY    . bladder injection    . botox bladder    . CHOLECYSTECTOMY    . COLONOSCOPY WITH PROPOFOL  N/A 09/11/2020   Procedure: COLONOSCOPY WITH PROPOFOL;  Surgeon: Midge Minium, MD;  Location: [facility] ENDOSCOPY;  Service: Endoscopy;  Laterality: N/A;  . HIP ARTHROPLASTY Right 04/13/2019   Procedure: ARTHROPLASTY BIPOLAR HIP (HEMIARTHROPLASTY);  Surgeon: Donato Heinz, MD;  Location: [facility] ORS;  Service: Orthopedics;  Laterality: Right;  . INSERT / REPLACE / REMOVE PACEMAKER     implant for SSS  . MELANOMA  EXCISION     face  . MITRAL VALVE REPLACEMENT     s/p mechanical valve replaced with bovine valve 2010  . NASAL SINUS SURGERY    . PPM GENERATOR CHANGEOUT N/A 09/21/2018   Procedure: PPM GENERATOR CHANGEOUT;  Surgeon: Duke Salvia, MD;  Location: [facility] INVASIVE CV LAB;  Service: Cardiovascular;  Laterality: N/A;  . TONSILLECTOMY      Social History  reports that she quit smoking about 49 years ago. Her smoking use included cigarettes. She has a 5.00 pack-year smoking history. She has never used smokeless tobacco. She reports that she does not drink alcohol and does not use drugs.  Allergies  Allergen Reactions  . Cephalexin Rash  . Latex Rash  . Morphine Nausea And Vomiting  . Tizanidine Palpitations  . Liothyronine Other (See Comments)  . Other Other (See Comments)    Several different statins.  . Statins Other (See Comments)    Several different statins.  . Tape Other (See Comments)    Breaks her skin Allergic to plastic/latex tape. Only use paper tape on patient. Breaks her skin: Allergic to plastic/latex tape. Only use paper tape on patient.  . Tapentadol Other (See Comments)  . Sulfa Antibiotics Rash and Other (See Comments)    Family History  Problem Relation Age of Onset  . Hypertension Other    Prior to Admission medications   Medication Sig Start Date End Date Taking? Authorizing Provider  acetaminophen (TYLENOL) 650 MG CR tablet Take 650 mg by mouth every 8 (eight) hours as needed for pain.   Yes [provider]  alendronate (FOSAMAX) 70 MG tablet Take 70 mg by mouth once a week.  08/19/19  Yes [provider]  ascorbic acid (VITAMIN C) 500 MG tablet Take 500 mg by mouth daily.   Yes [provider]  atorvastatin (LIPITOR) 80 MG tablet Take 80 mg by mouth every evening.    Yes [provider]  Calcium Carb-Cholecalciferol (OYSTER SHELL CALCIUM) 500-400 MG-UNIT TABS Take 1 tablet by mouth 2 (two) times daily. 07/18/19  Yes  [provider]  Cholecalciferol (VITAMIN D3) 1000 UNITS CAPS Take 2,000 Units by mouth every 30 (thirty) days.    Yes [provider]  clobetasol cream (TEMOVATE) 0.05 % Apply 1 application topically 2 (two) times daily.   Yes [provider]  cyclobenzaprine (FLEXERIL) 5 MG tablet Take 5 mg by mouth 2 (two) times daily as needed for muscle spasms. 07/18/19  Yes [provider]  diltiazem (CARDIZEM CD) 120 MG 24 hr capsule Take 1 capsule (120 mg total) by mouth daily. 09/06/16  Yes Antonieta Iba, MD  docusate sodium (COLACE) 100 MG capsule Take 100 mg by mouth daily as needed for mild constipation.   Yes [provider]  DULoxetine (CYMBALTA) 60 MG capsule Take 60 mg by mouth daily.  05/13/19  Yes [provider]  fluticasone (FLONASE) 50 MCG/ACT nasal spray 2 sprays by Each Nare route daily as needed for allergies. 06/27/18  Yes [provider]  gabapentin (NEURONTIN) 100 MG capsule Take 100  mg by mouth 3 (three) times daily. 04/30/19  Yes [provider]  guaiFENesin (MUCINEX) 600 MG 12 hr tablet Take 600 mg by mouth 2 (two) times daily as needed for cough or to loosen phlegm.   Yes [provider]  magnesium oxide (MAG-OX) 400 MG tablet Take 400 mg by mouth daily.  07/30/18  Yes [provider]  pantoprazole (PROTONIX) 40 MG tablet Take 40 mg by mouth daily.    Yes [provider]  rOPINIRole (REQUIP) 2 MG tablet Take 2 mg by mouth at bedtime.  05/29/15  Yes [provider]  senna (SENOKOT) 8.6 MG tablet Take 1 tablet by mouth as needed for constipation. 07/18/19  Yes [provider]  warfarin (COUMADIN) 2.5 MG tablet Take 2.5 mg by mouth as directed. Take on Monday, Wednesday, Thursday, Friday, Sunday   Yes [provider]  warfarin (COUMADIN) 3 MG tablet Take 3 mg by mouth as directed. Take on Tuesday and Saturday   Yes [provider]  FLUoxetine (PROZAC) 20 MG  capsule Take 20 mg by mouth daily. 05/01/20   [provider]    Physical Exam: Vitals:   10/01/20 2230 10/01/20 2245 10/01/20 2300 10/01/20 2330  BP: (!) 152/59  (!) 164/124 (!) 137/56  Pulse: 80 77 95 90  Resp: (!) 40 (!) 36 (!) 35 (!) 34  Temp:      TempSrc:      SpO2: 94% 92% 92% 94%  Weight:      Height:        Constitutional: Looks acutely ill.  NAD, calm, comfortable Eyes: PERRL, mild right conjunctivitis.  No icterus. ENMT: Mucous membranes are dry.. Posterior pharynx clear of any exudate or lesions. Neck: normal, supple, no masses, no thyromegaly Respiratory: Tachypneic in the high 20s to low 30s.  No accessory muscle use.  Bilateral wheezing and rhonchi.  Decreased breath sounds on bases more pronounced on the right with RLL crackles. Cardiovascular: Irregularly irregular with 2/6 systolic murmur.  Rubs / gallops. No extremity edema. 2+ pedal pulses. No carotid bruits.  Abdomen: No distention.  Bowel sounds positive.  Soft, no tenderness, no masses palpated. No hepatosplenomegaly. Musculoskeletal: no clubbing / cyanosis.  Good ROM, no contractures. Normal muscle tone.  Skin: no rashes, lesions, ulcers on very limited dermatological examination. Neurologic: CN 2-12 grossly intact. Sensation intact, DTR normal. Strength 5/5 in all 4.  Psychiatric: Normal judgment and insight. Alert and oriented x 3.  Anxious mood.   Labs on Admission: I have personally reviewed following labs and imaging studies  CBC: Recent Labs  Lab 10/01/20 1355  WBC 19.2*  HGB 10.9*  HCT 34.8*  MCV 97.5  PLT 257    Basic Metabolic Panel: Recent Labs  Lab 10/01/20 1355  NA 133*  K 4.2  CL 98  CO2 23  GLUCOSE 104*  BUN 45*  CREATININE 1.94*  CALCIUM 10.1    GFR: Estimated Creatinine Clearance: 25.8 mL/min (A) (by C-G formula based on SCr of 1.94 mg/dL (H)).  Liver Function Tests: Recent Labs  Lab 10/01/20 1601  AST 33  ALT 14  ALKPHOS 83  BILITOT 1.2  PROT 7.2   ALBUMIN 3.5   Radiological Exams on Admission: DG Chest 2 View  Result Date: 10/01/2020 CLINICAL DATA:  Shortness of breath. EXAM: CHEST - 2 VIEW COMPARISON:  04/27/2020. FINDINGS: Cardiac pacer in stable position. Prior median sternotomy and cardiac valve replacement. Cardiomegaly. Low lung volumes. Diffuse bilateral interstitial prominence with prominent alveolar infiltrates/edema  right lung. CHF and/or pneumonia could present this fashion. Small right pleural effusion. No pneumothorax. Degenerative changes scoliosis thoracic spine. Surgical clips right upper quadrant. IMPRESSION: 1. Cardiac pacer in stable position. Prior median sternotomy and cardiac valve replacement. Cardiomegaly. 2. Low lung volumes. Diffuse bilateral interstitial prominence with prominent alveolar infiltrates/edema right lung. CHF and/or pneumonia could present in this fashion. Electronically Signed   By: Maisie Fus  Register   On: 10/01/2020 14:14    EKG: Independently reviewed. Vent. rate 87 BPM PR interval * ms QRS duration 100 ms QT/QTc 340/409 ms P-R-T axes * 86 -74 Undetermined rhythm Marked ST abnormality, possible inferior subendocardial injury Abnormal ECG  Assessment/Plan Principal Problem:   CAP (community acquired pneumonia) Admit to stepdown/inpatient. Continue supplemental oxygen. Continue BiPAP as needed. Bronchodilators as needed. Check strep pneumoniae urinary antigen. Check Legionella urinary antigen. Sputum Gram stain, culture and sensitivity. Levofloxacin per pharmacy. Vancomycin per pharmacy. Follow blood culture and sensitivity. Follow-up CBC and CMP.  Active Problems:   AKI (acute kidney injury) (HCC) Careful time-limited IV hydration. Monitor intake and output. Check renal ultrasound.    Chronic diastolic heart failure (HCC) No signs of decompensation at this time. Check echocardiogram.    Essential hypertension Continue diltiazem 120 mg p.o. daily. Monitor blood pressure and  heart rate.    Pulmonary hypertension (HCC) On Cardizem CD. Check echocardiogram.    Elevated troponin Likely demand ischemia. Second troponin level stable.    Permanent atrial fibrillation (HCC) CHA?DS?-VASc Score of at least six. On warfarin for mechanical while. Continue diltiazem for rate control.    Hyperlipidemia Continue atorvastatin 80 mg p.o. daily.    Esophageal reflux Continue pantoprazole 40 mg p.o. daily.    OSA (obstructive sleep apnea) Continue BiPAP at bedtime or as needed.    Normocytic anemia Monitor H&H. Check anemia panel.   DVT prophylaxis: On warfarin per pharmacy. Code Status:   Full code. Family Communication: Disposition Plan:   Patient is from:  Home.  Anticipated DC to:  Home.  Anticipated DC date:  10/03/2020.  Anticipated DC barriers: Clinical status.  Consults called: Admission status:  Observation/stepdown.   Severity of Illness: The patient will need to remain in the hospital for IV antibiotic infusion and respiratory therapy support, currently requiring BiPAP ventilation mode.  Bobette Mo MD [facility]  How to contact the [facility] Attending or Consulting provider 7A - 7P or covering provider during after hours 7P -7A, for this patient?   1. Check the care team in [facility] and look for a) attending/consulting TRH provider listed and b) the [facility] team listed 2. Log into [email or web address] and use [place]'s universal password to access. If you do not have the password, please contact the hospital operator. 3. Locate the [facility] provider you are looking for under [facility] and page to a number that you can be directly reached. 4. If you still have difficulty reaching the provider, please page the [facility] (Director on Call) for the Hospitalists listed on amion for assistance.  10/01/2020, 11:41 PM   This document was prepared using Dragon voice recognition software and may contain some unintended transcription errors.

## 2020-10-01 NOTE — ED Notes (Signed)
Pt drowsy after ativan.  Pt unable to swallow pills.  Pt on bipap.

## 2020-10-01 NOTE — ED Notes (Signed)
Med given to help with breathing.  hospitalist in with pt.  Rt in with pt and placed on bipap after breathing treatments.  Pt awake  Iv in place.  afib on monitor.

## 2020-10-01 NOTE — ED Notes (Signed)
Pt on 2 liters oxygen. 2 ivs in place.  Iv meds infusing   Pt alert and oriented.  afib on monitor.

## 2020-10-01 NOTE — ED Notes (Signed)
bipap in place, pt drowsy from ativan.  afib on monitor.  Iv in place.  Pt waiting on admission bed.

## 2020-10-02 ENCOUNTER — Observation Stay: Payer: Medicare Other

## 2020-10-02 ENCOUNTER — Inpatient Hospital Stay (HOSPITAL_COMMUNITY)
Admit: 2020-10-02 | Discharge: 2020-10-02 | Disposition: A | Discharge status: Home / Self Care | Payer: Medicare Other | Attending: Internal Medicine | Admitting: Internal Medicine

## 2020-10-02 DIAGNOSIS — I083 Combined rheumatic disorders of mitral, aortic and tricuspid valves: Secondary | ICD-10-CM | POA: Diagnosis present

## 2020-10-02 DIAGNOSIS — I4891 Unspecified atrial fibrillation: Secondary | ICD-10-CM | POA: Diagnosis not present

## 2020-10-02 DIAGNOSIS — N261 Atrophy of kidney (terminal): Secondary | ICD-10-CM | POA: Diagnosis present

## 2020-10-02 DIAGNOSIS — I4821 Permanent atrial fibrillation: Secondary | ICD-10-CM

## 2020-10-02 DIAGNOSIS — Z952 Presence of prosthetic heart valve: Secondary | ICD-10-CM | POA: Diagnosis not present

## 2020-10-02 DIAGNOSIS — I248 Other forms of acute ischemic heart disease: Secondary | ICD-10-CM | POA: Diagnosis present

## 2020-10-02 DIAGNOSIS — G4733 Obstructive sleep apnea (adult) (pediatric): Secondary | ICD-10-CM | POA: Diagnosis present

## 2020-10-02 DIAGNOSIS — I5032 Chronic diastolic (congestive) heart failure: Secondary | ICD-10-CM

## 2020-10-02 DIAGNOSIS — I361 Nonrheumatic tricuspid (valve) insufficiency: Secondary | ICD-10-CM

## 2020-10-02 DIAGNOSIS — G2581 Restless legs syndrome: Secondary | ICD-10-CM | POA: Diagnosis present

## 2020-10-02 DIAGNOSIS — J189 Pneumonia, unspecified organism: Secondary | ICD-10-CM | POA: Diagnosis present

## 2020-10-02 DIAGNOSIS — Z20822 Contact with and (suspected) exposure to covid-19: Secondary | ICD-10-CM | POA: Diagnosis present

## 2020-10-02 DIAGNOSIS — E7800 Pure hypercholesterolemia, unspecified: Secondary | ICD-10-CM | POA: Diagnosis present

## 2020-10-02 DIAGNOSIS — N179 Acute kidney failure, unspecified: Secondary | ICD-10-CM | POA: Diagnosis present

## 2020-10-02 DIAGNOSIS — R791 Abnormal coagulation profile: Secondary | ICD-10-CM | POA: Diagnosis present

## 2020-10-02 DIAGNOSIS — K219 Gastro-esophageal reflux disease without esophagitis: Secondary | ICD-10-CM | POA: Diagnosis present

## 2020-10-02 DIAGNOSIS — I272 Pulmonary hypertension, unspecified: Secondary | ICD-10-CM | POA: Diagnosis present

## 2020-10-02 DIAGNOSIS — G319 Degenerative disease of nervous system, unspecified: Secondary | ICD-10-CM | POA: Diagnosis present

## 2020-10-02 DIAGNOSIS — I739 Peripheral vascular disease, unspecified: Secondary | ICD-10-CM | POA: Diagnosis present

## 2020-10-02 DIAGNOSIS — J9601 Acute respiratory failure with hypoxia: Secondary | ICD-10-CM | POA: Diagnosis present

## 2020-10-02 DIAGNOSIS — Z96653 Presence of artificial knee joint, bilateral: Secondary | ICD-10-CM | POA: Diagnosis present

## 2020-10-02 DIAGNOSIS — N181 Chronic kidney disease, stage 1: Secondary | ICD-10-CM | POA: Diagnosis present

## 2020-10-02 DIAGNOSIS — Z8673 Personal history of transient ischemic attack (TIA), and cerebral infarction without residual deficits: Secondary | ICD-10-CM | POA: Diagnosis not present

## 2020-10-02 DIAGNOSIS — D649 Anemia, unspecified: Secondary | ICD-10-CM | POA: Diagnosis present

## 2020-10-02 DIAGNOSIS — R778 Other specified abnormalities of plasma proteins: Secondary | ICD-10-CM

## 2020-10-02 DIAGNOSIS — I130 Hypertensive heart and chronic kidney disease with heart failure and stage 1 through stage 4 chronic kidney disease, or unspecified chronic kidney disease: Secondary | ICD-10-CM | POA: Diagnosis present

## 2020-10-02 DIAGNOSIS — Y95 Nosocomial condition: Secondary | ICD-10-CM | POA: Diagnosis present

## 2020-10-02 DIAGNOSIS — E785 Hyperlipidemia, unspecified: Secondary | ICD-10-CM | POA: Diagnosis present

## 2020-10-02 LAB — RESP PANEL BY RT-PCR (RSV, FLU A&B, COVID)  RVPGX2
Influenza A by PCR: NEGATIVE
Influenza B by PCR: NEGATIVE
Resp Syncytial Virus by PCR: NEGATIVE
SARS Coronavirus 2 by RT PCR: NEGATIVE

## 2020-10-02 LAB — URINALYSIS, COMPLETE (UACMP) WITH MICROSCOPIC
Bilirubin Urine: NEGATIVE
Glucose, UA: NEGATIVE mg/dL
Ketones, ur: NEGATIVE mg/dL
Leukocytes,Ua: NEGATIVE
Nitrite: NEGATIVE
Protein, ur: 30 mg/dL — AB
Specific Gravity, Urine: 1.019 (ref 1.005–1.030)
Squamous Epithelial / HPF: NONE SEEN (ref 0–5)
pH: 5 (ref 5.0–8.0)

## 2020-10-02 LAB — CBC WITH DIFFERENTIAL/PLATELET
Abs Immature Granulocytes: 0.07 10*3/uL (ref 0.00–0.07)
Basophils Absolute: 0.1 10*3/uL (ref 0.0–0.1)
Basophils Relative: 1 %
Eosinophils Absolute: 0 10*3/uL (ref 0.0–0.5)
Eosinophils Relative: 0 %
HCT: 31.8 % — ABNORMAL LOW (ref 36.0–46.0)
Hemoglobin: 10.4 g/dL — ABNORMAL LOW (ref 12.0–15.0)
Immature Granulocytes: 0 %
Lymphocytes Relative: 1 %
Lymphs Abs: 0.2 10*3/uL — ABNORMAL LOW (ref 0.7–4.0)
MCH: 31.5 pg (ref 26.0–34.0)
MCHC: 32.7 g/dL (ref 30.0–36.0)
MCV: 96.4 fL (ref 80.0–100.0)
Monocytes Absolute: 0.7 10*3/uL (ref 0.1–1.0)
Monocytes Relative: 4 %
Neutro Abs: 15.6 10*3/uL — ABNORMAL HIGH (ref 1.7–7.7)
Neutrophils Relative %: 94 %
Platelets: 227 10*3/uL (ref 150–400)
RBC: 3.3 MIL/uL — ABNORMAL LOW (ref 3.87–5.11)
RDW: 12.6 % (ref 11.5–15.5)
Smear Review: NORMAL
WBC: 16.7 10*3/uL — ABNORMAL HIGH (ref 4.0–10.5)
nRBC: 0 % (ref 0.0–0.2)

## 2020-10-02 LAB — ECHOCARDIOGRAM COMPLETE
Height: 69 in
S' Lateral: 3.22 cm
Weight: 2640 oz

## 2020-10-02 LAB — BASIC METABOLIC PANEL
Anion gap: 10 (ref 5–15)
BUN: 44 mg/dL — ABNORMAL HIGH (ref 8–23)
CO2: 23 mmol/L (ref 22–32)
Calcium: 9.4 mg/dL (ref 8.9–10.3)
Chloride: 104 mmol/L (ref 98–111)
Creatinine, Ser: 1.41 mg/dL — ABNORMAL HIGH (ref 0.44–1.00)
GFR, Estimated: 39 mL/min — ABNORMAL LOW (ref >60)
Glucose, Bld: 151 mg/dL — ABNORMAL HIGH (ref 70–99)
Potassium: 4 mmol/L (ref 3.5–5.1)
Sodium: 137 mmol/L (ref 135–145)

## 2020-10-02 LAB — RETICULOCYTES
Immature Retic Fract: 13.8 % (ref 2.3–15.9)
RBC.: 3.13 MIL/uL — ABNORMAL LOW (ref 3.87–5.11)
Retic Count, Absolute: 25.4 10*3/uL (ref 19.0–186.0)
Retic Ct Pct: 0.8 % (ref 0.4–3.1)

## 2020-10-02 LAB — FERRITIN: Ferritin: 137 ng/mL (ref 11–307)

## 2020-10-02 LAB — FOLATE: Folate: 9 ng/mL (ref >5.9)

## 2020-10-02 LAB — VITAMIN B12: Vitamin B-12: 282 pg/mL (ref 180–914)

## 2020-10-02 LAB — IRON AND TIBC
Iron: 13 ug/dL — ABNORMAL LOW (ref 28–170)
Saturation Ratios: 6 % — ABNORMAL LOW (ref 10.4–31.8)
TIBC: 217 ug/dL — ABNORMAL LOW (ref 250–450)
UIBC: 204 ug/dL

## 2020-10-02 LAB — PROTIME-INR
INR: 3.5 — ABNORMAL HIGH (ref 0.8–1.2)
Prothrombin Time: 33.7 seconds — ABNORMAL HIGH (ref 11.4–15.2)

## 2020-10-02 LAB — MRSA PCR SCREENING: MRSA by PCR: NEGATIVE

## 2020-10-02 MED ORDER — VANCOMYCIN HCL 1500 MG/300ML IV SOLN
1500.0000 mg | Freq: Once | INTRAVENOUS | Status: DC
  Filled 2020-10-02: qty 300

## 2020-10-02 MED ORDER — METHYLPREDNISOLONE SODIUM SUCC 40 MG IJ SOLR
40.0000 mg | Freq: Two times a day (BID) | INTRAMUSCULAR | Status: AC
  Administered 2020-10-02 (×2): 40 mg via INTRAVENOUS
  Filled 2020-10-02 (×2): qty 1

## 2020-10-02 MED ORDER — VANCOMYCIN HCL 500 MG/100ML IV SOLN
500.0000 mg | INTRAVENOUS | Status: DC
  Filled 2020-10-02: qty 100

## 2020-10-02 MED ORDER — VANCOMYCIN HCL 500 MG/100ML IV SOLN: 500.0000 mg | INTRAVENOUS | Status: DC

## 2020-10-02 MED ORDER — LEVOFLOXACIN IN D5W 750 MG/150ML IV SOLN
750.0000 mg | INTRAVENOUS | Status: DC
  Administered 2020-10-02 – 2020-10-04 (×2): 750 mg via INTRAVENOUS
  Filled 2020-10-02 (×3): qty 150

## 2020-10-02 MED ORDER — PREDNISONE 20 MG PO TABS
40.0000 mg | ORAL_TABLET | Freq: Every day | ORAL | Status: DC
  Administered 2020-10-03 – 2020-10-05 (×3): 40 mg via ORAL
  Filled 2020-10-02 (×3): qty 2

## 2020-10-02 MED ORDER — IPRATROPIUM BROMIDE 0.02 % IN SOLN: 0.5000 mg | Freq: Four times a day (QID) | RESPIRATORY_TRACT | Status: DC | PRN

## 2020-10-02 MED ORDER — LEVALBUTEROL HCL 1.25 MG/0.5ML IN NEBU
1.2500 mg | INHALATION_SOLUTION | Freq: Four times a day (QID) | RESPIRATORY_TRACT | Status: DC | PRN
  Filled 2020-10-02: qty 0.5

## 2020-10-02 NOTE — Evaluation (Signed)
Occupational Therapy Evaluation Patient Details Name: Rhonda Griffith MRN: [id number] DOB: 03-Aug-1946 Today's Date: 10/02/2020    History of Present Illness Pt is a 75 y.o. 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   Clinical Impression   Rhonda Griffith was seen for OT evaluation this date. Prior to hospital admission, pt was MOD I for mobility, reports daughter assists for LB bathing and toileting. Pt lives with her daughter and SIL who would be available 24/7. Pt presents to acute OT demonstrating impaired ADL performance and functional mobility 2/2 decreased activty tolerance and functional strength/ROM/balance deficits.   Pt currently requires MOD A for LB access seated EOB. MIN A lateral scoot t/f at EOB. MIN A adjust cpap machine (on t/o session). Pt would benefit from skilled OT to address noted impairments and functional limitations (see below for any additional details) in order to maximize safety and independence while minimizing falls risk and caregiver burden. Upon hospital discharge, recommend STR to maximize pt safety and return to PLOF.     Follow Up Recommendations  SNF    Equipment Recommendations  None recommended by OT    Recommendations for Other Services       Precautions / Restrictions Precautions Precautions: Fall Precaution Comments: two weeks ago: L hip with necrotic soft tissue around the joint Restrictions Weight Bearing Restrictions: No      Mobility Bed Mobility Overal bed mobility: Needs Assistance Bed Mobility: Supine to Sit;Sit to Supine      Supine to sit: Min assist Sit to supine: Mod assist   General bed mobility comments: BLE assist    Transfers Overall transfer level: Needs assistance Equipment used: Rolling walker (2 wheeled) Transfers: Lateral/Scoot Transfers Sit to Stand: Min assist;From elevated surface        Lateral/Scoot Transfers: Min assist      Balance Overall balance assessment: Needs assistance;History of Falls Sitting-balance support: Feet supported;Bilateral upper extremity supported Sitting balance-Leahy Scale: Fair Sitting balance - Comments: poor sitting balance and posture noted with fatigue                    ADL either performed or assessed with clinical judgement   ADL Overall ADL's : Needs assistance/impaired                                       General ADL Comments: MOD A for LB access seated EOB. MIN A lateral scoot t/f at EOB. MIN A adjust cpap machine                  Pertinent Vitals/Pain Pain Assessment: Faces Faces Pain Scale: Hurts a little bit Pain Location: L hip movements Pain Descriptors / Indicators: Grimacing;Guarding;Aching Pain Intervention(s): Limited activity within patient's tolerance;Repositioned     Hand Dominance Right   Extremity/Trunk Assessment Upper Extremity Assessment Upper Extremity Assessment: Generalized weakness   Lower Extremity Assessment Lower Extremity Assessment: Generalized weakness   Cervical / Trunk Assessment Cervical / Trunk Assessment: Kyphotic   Communication Communication Communication: No difficulties   Cognition Arousal/Alertness: Awake/alert Behavior During Therapy: WFL for tasks assessed/performed Overall Cognitive Status:  Within Functional Limits for tasks assessed                                     General Comments  pt with CPAP mask on face t/o session (4L O2 noted), SpO2 stable t/o    Exercises Exercises: Other exercises Other Exercises Other Exercises: Pt  educated re: OT role, DME recs, d/c recs, falls prevention, ECS Other Exercises: LBD, grooming, sup<>sit, lateral scoot t/f   Shoulder Instructions      Home Living Family/patient expects to be discharged to:: Private residence Living Arrangements: Children Available Help at Discharge: Family;Available 24 hours/day Type of Home: House Home Access: Stairs to enter [employer] of Steps: 2 or 5 Entrance Stairs-Rails: Right;Left;Can reach both Home Layout: One level         Firefighter: Standard     Home Equipment: Environmental consultant - 2 wheels;Wheelchair - manual;Shower seat;Grab bars - tub/shower;Grab bars - toilet   Additional Comments: pt reports her family is supportive and encourage her to walk as much as tolerated      Prior Functioning/Environment Level of Independence: Independent with assistive device(s)        Comments: has used RW or HHA        OT Problem List: Decreased strength;Decreased range of motion;Decreased activity tolerance;Impaired balance (sitting and/or standing);Decreased safety awareness      OT Treatment/Interventions: Self-care/ADL training;Therapeutic exercise;DME and/or AE instruction;Energy conservation;Therapeutic activities;Patient/family education;Balance training    OT Goals(Current goals can be found in the care plan section) Acute Rehab OT Goals Patient Stated Goal: to get home and get stronger OT Goal Formulation: With patient Time For Goal Achievement: 10/16/20 Potential to Achieve Goals: Good ADL Goals Pt Will Perform Grooming: with min assist;standing (c LRAD PRN) Pt Will Perform Upper Body Bathing: with modified independence;sitting Pt Will Transfer to Toilet: with supervision;stand pivot transfer;bedside commode (c LRAD PRN)  OT Frequency: Min 1X/week    AM-PAC OT "6 Clicks" Daily Activity     Outcome Measure Help from another person eating meals?: None Help from another person taking care of personal grooming?: A  Little Help from another person toileting, which includes using toliet, bedpan, or urinal?: A Lot Help from another person bathing (including washing, rinsing, drying)?: A Lot Help from another person to put on and taking off regular upper body clothing?: A Little Help from another person to put on and taking off regular lower body clothing?: A Lot 6 Click Score: 16   End of Session Equipment Utilized During Treatment: Oxygen  Activity Tolerance: Patient limited by fatigue Patient left: in bed;with call bell/phone within reach  OT Visit Diagnosis: Unsteadiness on feet (R26.81)                Time: 3149-7026 OT Time Calculation (min): 10 min Charges:  OT General Charges $OT Visit: 1 Visit OT Evaluation $OT Eval Low Complexity: 1 Low OT Treatments $Therapeutic Activity: 8-22 mins  Kathie Dike, M.S. OTR/L  10/02/20, 11:15 AM  ascom [phone]

## 2020-10-02 NOTE — Progress Notes (Signed)
PROGRESS NOTE    Rhonda Griffith  UVO:[id number] DOB: 10/10/1945 DOA: 10/01/2020 PCP: [name], MD   Brief Narrative:  Patient is a 75 year old female with past medical history of A. 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  ED course: Afebrile, tachypneic, heart rate 81, blood pressure 108/78, oxygen saturation 93% on room air.  Received vancomycin and aztreonam and loading dose of Solu-Medrol, bronchodilators and BiPAP as needed.  She was also given lorazepam 0.5 mg IV once.  CBC shows leukocytosis of 19.2, hemoglobin: 10.9, INR: 3.1, troponin 80-81, BNP: 258, sodium: 133, lactic acid: WNL, LFTs: WNL.  Chest radiograph shows right lower lobe lobe airspace disease.  Patient admitted for further evaluation and management.  Assessment & Plan:   Acute hypoxemic respiratory failure in the setting of community-acquired pneumonia: -Patient presented with worsening shortness of breath, productive cough and requiring supplemental oxygen and BiPAP as needed. -Afebrile with leukocytosis, Lactic acid: WNL, COVID-19 negative. -Continue levofloxacin and vancomycin and IV steroids -Urine Legionella antigen and urine strep pneumo antigen: Pending.  Follow blood culture result.  Covid negative.  PCT: 27.17 -Continue bronchodilators  AKI: -Renal function improving.  Monitor INO's. -Renal ultrasound shows bilateral renal cortical atrophy.  No hydronephrosis. -Repeat BMP tomorrow a.m.  Chronic diastolic CHF: Patient appears euvolemic on exam.  Chest x-ray shows no fluid overload.  BNP: 258 -Echo is pending. -Strict INO's and daily weight and monitor signs of fluid overload  Hypertension: Blood  pressure is stable.  Continue diltiazem 120 mg p.o. daily.  Elevated troponin: Likely in the setting of demand ischemia in the setting of underlying infection.  Patient denies ACS symptoms.  Continue to monitor  Permanent A. Fib/history of rheumatic aortic and mitral valve stenosis status post mechanical heart valve: -Continue Cardizem and Coumadin as per pharmacy  Hyperlipidemia: Continue statin  GERD: Continue PPI  OSA: Continue BiPAP as needed  Normocytic anemia: Continue to monitor H&H closely.  Reviewed iron panel.  Restless leg syndrome: Continue Requip  Debility: PT/OT recommended SNF.  Will consult TOC.  DVT prophylaxis: Coumadin Code Status: Full code Family Communication: None present at bedside.  Plan of care discussed with patient in length and she verbalized understanding and agreed with it. Disposition Plan: To be determined  Consultants:   None  Procedures:   Echo  Antimicrobials:   Levaquin  Vancomycin  Status is: Inpatient   Dispo: The patient is from: Home              Anticipated d/c is to: SNF              Anticipated d/c date is: 2 days              Patient currently is not medically stable to d/c.         Subjective: Patient seen and examined in the ED.  On BiPAP.  Tells me that she feels much better as compared to yesterday.  Her breathing has improved.  She continues to have productive cough without any sputum.  She denies any fever or chills leg swelling, orthopnea or PND.  She does not smoke and does not use oxygen at home.  Objective: Vitals:   10/02/20 0500 10/02/20 0530 10/02/20 0630 10/02/20 0645  BP:  108/87 90/63 130/75   Pulse: 68 74 68 87  Resp: (!) 31 (!) 27  (!) 27  Temp:      TempSrc:      SpO2: 98% 100% 100% 99%  Weight:      Height:       No intake or output data in the 24 hours ending 10/02/20 1205 Filed Weights   10/01/20 1339  Weight: 74.8 kg    Examination:  General exam: Appears calm and comfortable  on BiPAP, thin and lean, elderly looking, communicating well Respiratory system: Clear to auscultation. Respiratory effort normal. Cardiovascular system: Expiratory wheezing and crackles noted on the bases. Gastrointestinal system: Abdomen is nondistended, soft and nontender. No organomegaly or masses felt. Normal bowel sounds heard. Central nervous system: Alert and oriented. No focal neurological deficits. Extremities: Symmetric 5 x 5 power. Skin: No rashes, lesions or ulcers Psychiatry: Judgement and insight appear normal. Mood & affect appropriate.    Data Reviewed: I have personally reviewed following labs and imaging studies  CBC: Recent Labs  Lab 10/01/20 1355 10/02/20 0407  WBC 19.2* 16.7*  NEUTROABS  --  15.6*  HGB 10.9* 10.4*  HCT 34.8* 31.8*  MCV 97.5 96.4  PLT 257 227   Basic Metabolic Panel: Recent Labs  Lab 10/01/20 1355 10/02/20 0407  NA 133* 137  K 4.2 4.0  CL 98 104  CO2 23 23  GLUCOSE 104* 151*  BUN 45* 44*  CREATININE 1.94* 1.41*  CALCIUM 10.1 9.4   GFR: Estimated Creatinine Clearance: 35.5 mL/min (A) (by C-G formula based on SCr of 1.41 mg/dL (H)). Liver Function Tests: Recent Labs  Lab 10/01/20 1601  AST 33  ALT 14  ALKPHOS 83  BILITOT 1.2  PROT 7.2  ALBUMIN 3.5   No results for input(s): LIPASE, AMYLASE in the last 168 hours. No results for input(s): AMMONIA in the last 168 hours. Coagulation Profile: Recent Labs  Lab 10/01/20 1355 10/02/20 0407  INR 3.1* 3.5*   Cardiac Enzymes: No results for input(s): CKTOTAL, CKMB, CKMBINDEX, TROPONINI in the last 168 hours. BNP (last 3 results) No results for input(s): PROBNP in the last 8760 hours. HbA1C: No results for input(s): HGBA1C in the last 72 hours. CBG: No results for input(s): GLUCAP in the last 168 hours. Lipid Profile: No results for input(s): CHOL, HDL, LDLCALC, TRIG, CHOLHDL, LDLDIRECT in the last 72 hours. Thyroid Function Tests: No results for input(s): TSH, T4TOTAL,  FREET4, T3FREE, THYROIDAB in the last 72 hours. Anemia Panel: Recent Labs    10/02/20 0407  FOLATE 9.0  FERRITIN 137  TIBC 217*  IRON 13*  RETICCTPCT 0.8   Sepsis Labs: Recent Labs  Lab 10/01/20 1355 10/01/20 1810 10/01/20 1900  PROCALCITON 27.17  --   --   LATICACIDVEN  --  1.6 1.5    Recent Results (from the past 240 hour(s))  Culture, blood (routine x 2)     Status: None (Preliminary result)   Collection Time: 10/01/20  7:00 PM   Specimen: BLOOD  Result Value Ref Range Status   Specimen Description BLOOD BLOOD RIGHT FOREARM  Final   Special Requests   Final    BOTTLES DRAWN AEROBIC AND ANAEROBIC Blood Culture results may not be optimal due to an inadequate volume of blood received in culture bottles   Culture   Final    NO GROWTH < 12 HOURS Performed at [facility], [address] [place], [place] [address]    Report Status PENDING  Incomplete  Culture, blood (routine x 2)     Status: None (Preliminary result)   Collection Time: 10/01/20  7:00 PM   Specimen: BLOOD  Result Value Ref Range Status   Specimen Description BLOOD BLOOD LEFT FOREARM  Final   Special Requests   Final    BOTTLES DRAWN AEROBIC AND ANAEROBIC Blood Culture adequate volume   Culture   Final    NO GROWTH < 12 HOURS Performed at [facility], [address], [place], [place] [address]    Report Status PENDING  Incomplete      Radiology Studies: DG Chest 2 View  Result Date: 10/01/2020 CLINICAL DATA:  Shortness of breath. EXAM: CHEST - 2 VIEW COMPARISON:  04/27/2020. FINDINGS: Cardiac pacer in stable position. Prior median sternotomy and cardiac valve replacement. Cardiomegaly. Low lung volumes. Diffuse bilateral interstitial prominence with prominent alveolar infiltrates/edema right lung. CHF and/or pneumonia could present this fashion. Small right pleural effusion. No pneumothorax. Degenerative changes scoliosis thoracic spine. Surgical clips right upper quadrant.  IMPRESSION: 1. Cardiac pacer in stable position. Prior median sternotomy and cardiac valve replacement. Cardiomegaly. 2. Low lung volumes. Diffuse bilateral interstitial prominence with prominent alveolar infiltrates/edema right lung. CHF and/or pneumonia could present in this fashion. Electronically Signed   By: Marcello Moores  Register   On: 10/01/2020 14:14   US RENAL  Result Date: 10/02/2020 CLINICAL DATA:  Acute renal insufficiency EXAM: RENAL / URINARY TRACT ULTRASOUND COMPLETE COMPARISON:  None. FINDINGS: Right Kidney: Renal measurements: 9.4 x 4.8 x 4.8 cm = volume: 112 mL. There is mild renal cortical atrophy noted. The renal cortical echogenicity is within normal limits. 7 mm simple cortical cyst is seen within the interpolar region of the right kidney. Left Kidney: Renal measurements: 9.2 x 4.8 x 3.8 cm = volume: 89 mL. There is mild slightly asymmetric cortical atrophy of the left kidney. Renal parenchymal echogenicity is within normal limits. Multiple simple exophytic cortical cysts are seen arising from the left kidney measuring up to 1.9 cm. Bladder: Appears normal for degree of bladder distention. Bilateral ureteral jets are identified. Other: None. IMPRESSION: Bilateral renal cortical atrophy.  No hydronephrosis. Electronically Signed   By: Fidela Salisbury MD   On: 10/02/2020 05:52    Scheduled Meds: . ascorbic acid  500 mg Oral Daily  . atorvastatin  80 mg Oral QPM  . calcium-vitamin D  1 tablet Oral BID  . cholecalciferol  2,000 Units Oral Daily  . diltiazem  120 mg Oral Daily  . DULoxetine  60 mg Oral Daily  . gabapentin  100 mg Oral TID  . magnesium oxide  400 mg Oral Daily  . methylPREDNISolone (SOLU-MEDROL) injection  40 mg Intravenous Q12H   Followed by  . [START ON 10/03/2020] predniSONE  40 mg Oral Q breakfast  . pantoprazole  40 mg Oral Daily  . rOPINIRole  2 mg Oral QHS  . Warfarin - Pharmacist Dosing Inpatient   Does not apply q1600   Continuous Infusions: . lactated ringers  75 mL/hr at 10/02/20 0006  . levofloxacin (LEVAQUIN) IV Stopped (10/02/20 0730)  . vancomycin       LOS: 0 days   Time spent: 35 minutes   Mycheal Veldhuizen Loann Quill, MD [facility]  If 7PM-7AM, please contact night-coverage [email or web address] 10/02/2020, 12:05 PM

## 2020-10-02 NOTE — Evaluation (Signed)
Physical Therapy Evaluation Patient Details Name: Rhonda Griffith MRN: [id number] DOB: [birth year] Today's Date: 10/02/2020   History of Present Illness  Pt is a 75 y.o. 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    Clinical Impression  Pt alert, agreeable to PT session, denied pain at rest. Able to provide clear PLOF; previously modI-supervision for ADLs, lives with her daughter and SIL who would be available 24/7 to assist as needed and performs home making duties. Pt denied falls in the last 6 months.  Pt with cpap in place at start of session. Doffed for PT evaluation, spO2 monitored closely. Mild desaturation possible with standing, but unclear readings due to spO2 in place on toe. Donned by PT at end of session.  Pt with some light headedness/dizziness that resolved at end of session. Pt demonstrated supine to sit with minA, ultimately maxA to return to supine to due fatigue. She was sit <> stand with RW and CGA from elevated surface, and stand ~60minutes total and side step at bed with RW and CGA, fatigued quickly.  Overall the patient demonstrated deficits (see "PT Problem List") that impede the patient's functional abilities, safety, and mobility and would benefit from skilled PT intervention.  Recommendation is SNF due to acute decline in functional status and current level of assistance needed.      Follow Up Recommendations SNF    Equipment Recommendations  None recommended by PT (pt has RW and WC at home)    Recommendations for Other Services       Precautions / Restrictions  Precautions Precautions: Fall Precaution Comments: two weeks ago: L hip with necrotic soft tissue around the joint Restrictions Weight Bearing Restrictions: No      Mobility  Bed Mobility Overal bed mobility: Needs Assistance Bed Mobility: Supine to Sit;Sit to Supine     Supine to sit: Min assist Sit to supine: Max assist   General bed mobility comments: Patient needed maxA for return to supine for LE assist and trunk control    Transfers Overall transfer level: Needs assistance Equipment used: Rolling walker (2 wheeled) Transfers: Sit to/from Stand Sit to Stand: Min assist;From elevated surface            Ambulation/Gait Ambulation/Gait assistance: Min guard Gait Distance (Feet): 11 Feet Assistive device: Rolling walker (2 wheeled)       General Gait Details: Pt able to side step at EOB ~38ft total. Endorsed fatigue unable to ambulate further  Stairs            Wheelchair Mobility    Modified Rankin (Stroke Patients Only)       Balance Overall balance assessment: Needs assistance;History of Falls Sitting-balance support: Feet supported;Bilateral upper extremity supported Sitting balance-Leahy Scale: Fair Sitting balance - Comments: poor sitting balance and posture noted with fatigue   Standing balance support: Bilateral upper extremity supported;During functional activity Standing balance-Leahy Scale: Poor Standing balance comment: RW essential but also cues for posture and safety                             Pertinent Vitals/Pain Pain Assessment: Faces Faces Pain Scale: Hurts a little bit  Pain Location: L hip movements Pain Descriptors / Indicators: Grimacing;Guarding;Aching Pain Intervention(s): Limited activity within patient's tolerance;Monitored during session;Premedicated before session;Repositioned    Home Living Family/patient expects to be discharged to:: Private residence Living Arrangements: Children Available Help at  Discharge: Family;Available 24 hours/day Type of Home: House Home Access: Stairs to enter Entrance Stairs-Rails: Right;Left;Can reach both Entrance Stairs-Number of Steps: 2 or 5 Home Layout: One level Home Equipment: Walker - 2 wheels;Wheelchair - manual;Shower seat;Grab bars - tub/shower;Grab bars - toilet Additional Comments: pt reports her family is supportive and encourage her to walk as much as tolerated    Prior Function Level of Independence: Independent with assistive device(s)         Comments: has used RW or HHA     Hand Dominance   Dominant Hand: Right    Extremity/Trunk Assessment   Upper Extremity Assessment Upper Extremity Assessment: Generalized weakness    Lower Extremity Assessment Lower Extremity Assessment: Generalized weakness    Cervical / Trunk Assessment Cervical / Trunk Assessment: Kyphotic  Communication   Communication: No difficulties  Cognition Arousal/Alertness: Awake/alert Behavior During Therapy: WFL for tasks assessed/performed Overall Cognitive Status: Within Functional Limits for tasks assessed                                        General Comments General comments (skin integrity, edema, etc.): pt with CPAP mask on face at start of session, doffed for PT session, donned at end of session. spO2 monitored continuously. Mild desaturation to 80s after standing and stepping, unclear readings due to spO2 reader on toe.    Exercises     Assessment/Plan    PT Assessment Patient needs continued PT services  PT Problem List Decreased strength;Decreased range of motion;Decreased activity tolerance;Decreased balance;Decreased mobility;Decreased coordination;Decreased knowledge of use of DME       PT Treatment Interventions DME instruction;Gait training;Stair training;Functional mobility training;Therapeutic activities;Therapeutic exercise;Balance training;Neuromuscular re-education;Patient/family education    PT Goals  (Current goals can be found in the Care Plan section)  Acute Rehab PT Goals Patient Stated Goal: to get home and get stronger Time For Goal Achievement: 10/16/20 Potential to Achieve Goals: Good    Frequency Min 2X/week   Barriers to discharge Inaccessible home environment      Co-evaluation               AM-PAC PT "6 Clicks" Mobility  Outcome Measure Help needed turning from your back to your side while in a flat bed without using bedrails?: A Little Help needed moving from lying on your back to sitting on the side of a flat bed without using bedrails?: A Lot Help needed moving to and from a bed to a chair (including a wheelchair)?: A Lot Help needed standing up from a chair using your arms (e.g., wheelchair or bedside chair)?: A Lot Help needed to walk in hospital room?: A Lot Help needed climbing 3-5 steps with a railing? : Total 6 Click Score: 12    End of Session Equipment Utilized During Treatment: Gait belt Activity Tolerance: Patient limited by fatigue Patient left: in bed;with call bell/phone within reach;with bed alarm set Nurse Communication: Mobility status PT Visit Diagnosis: Unsteadiness on feet (R26.81);Muscle weakness (generalized) (M62.81);History of falling (Z91.81);Other abnormalities of gait and mobility (R26.89)    Time: 7371-0626 PT Time Calculation (min) (ACUTE ONLY): 30 min   Charges:   PT Evaluation $PT Eval  Moderate Complexity: 1 Mod PT Treatments $Therapeutic Exercise: 8-22 mins        Lieutenant Diego PT, DPT 10:06 AM,10/02/20

## 2020-10-02 NOTE — NC FL2 (Signed)
[place] LEVEL OF CARE SCREENING TOOL     IDENTIFICATION  Patient Name: Rhonda Griffith Birthdate: 05/30/1946 Sex: female Admission Date (Current Location): 10/01/2020  [place] and [place] Number:  Engineering geologist and Address:  [facility], [address], [place],  [address]      Provider Number: [id number]  Attending Physician Name and Address:  Mckinley Jewel, MD  Relative Name and Phone Number:  Christia Reading [phone]    Current Level of Care: Hospital Recommended Level of Care: [place] Prior Approval Number:    Date Approved/Denied:   PASRR Number: [id number]  Discharge Plan: SNF    Current Diagnoses: Patient Active Problem List   Diagnosis Date Noted  . CAP (community acquired pneumonia) 10/01/2020  . AKI (acute kidney injury) ([place]) 10/01/2020  . Elevated troponin 10/01/2020  . Normocytic anemia 10/01/2020  . BRBPR (bright red blood per rectum) 09/10/2020  . Closed right hip fracture, initial encounter ([place]) 04/10/2019  . Degeneration of lumbar intervertebral disc 02/21/2019  . Lumbar radiculopathy 02/21/2019  . Spinal stenosis of lumbar region 02/21/2019  . Cellulitis 08/30/2018  . Closed fracture of distal end of left fibula 06/28/2018  . Acute respiratory failure with hypoxia ([place]) 10/14/2016  . 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 Influenza A 10/14/2016  . Status post cardiac pacemaker procedure 10/14/2016  . Stroke ([place]) 10/14/2016  . ERRONEOUS ENCOUNTER--DISREGARD 07/03/2016  . Carotid bruit 12/22/2015  . Hyperlipidemia 10/23/2015  . Atrial fibrillation, permanent ([place])   . Pacemaker -Medtronic   . 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 Chronic kidney disease, unspecified   . Encounter for anticoagulation discussion and counseling 06/25/2015  . Acute cardioembolic stroke ([place]) 95/18/8416  . PAD (peripheral artery disease) ([place]) 06/25/2015  . Pure  hypercholesterolemia 04/30/2015  . History of recent stroke 04/28/2015  . Essential hypertension 12/09/2014  . Gait instability 09/08/2014  . Status post total bilateral knee replacement 06/24/2014  . UTI (urinary tract infection) 02/03/2014  . Fatigue 10/01/2013  . Shortness of breath 09/12/2012  . Stress disorder, acute 09/12/2012  . Tachycardia 09/12/2012  . Atrial fibrillation ([place]) 09/12/2012  . Melanoma in situ ([place]) 07/11/2012  . Morphea 07/11/2012  . Long term (current) use of anticoagulants 07/02/2012  . History of malignant melanoma of skin 05/30/2012  . [place] arthritis 02/02/2012  . Sprain of MCL (medial collateral ligament) of knee 01/24/2012  . Squamous cell carcinoma 12/21/2011  . Carpal tunnel syndrome of left wrist 11/28/2011  . Sinoatrial node dysfunction (HCC)   . Movement disorder 08/08/2011  . Edema 06/07/2011  . Chronic diastolic heart failure ([place]) 05/27/2011  . Anxiety state 05/14/2011  . Esophageal reflux 05/14/2011  . Mitral valve replaced 05/14/2011  . Myalgia and myositis, unspecified 05/14/2011  . OSA (obstructive sleep apnea) 05/14/2011  . Osteoarthritis 05/14/2011  . Pulmonary hypertension ([place]) 05/14/2011  . Cardiac pacemaker in situ 10/03/2010    Orientation RESPIRATION BLADDER Height & Weight     Self,Time,Situation,Place  O2 (2L) Continent Weight: 74.8 kg Height:  5\' 9"  (175.3 cm)  BEHAVIORAL SYMPTOMS/MOOD NEUROLOGICAL BOWEL NUTRITION STATUS      Continent Diet (Heart Healthy)  AMBULATORY STATUS COMMUNICATION OF NEEDS Skin   Extensive Assist Verbally Normal                       Personal Care Assistance Level of Assistance  Bathing,Feeding,Dressing Bathing Assistance: Limited assistance Feeding assistance: Independent Dressing Assistance: Limited assistance  Functional Limitations Info  Hearing,Sight,Speech Sight Info: Adequate Hearing Info: Adequate Speech Info: Adequate    SPECIAL CARE FACTORS FREQUENCY  PT (By  licensed PT),OT (By licensed OT)                    Contractures Contractures Info: Not present    Additional Factors Info  Code Status,Allergies Code Status Info: Full Allergies Info: Latex, Cephalexin, Morphine, Tizanidine, Liothyronine, Statins, Tape, Tapentadol, Sulfa           Current Medications (10/02/2020):  This is the current hospital active medication list Current Facility-Administered Medications  Medication Dose Route Frequency Provider Last Rate Last Admin  . acetaminophen (TYLENOL) tablet 650 mg  650 mg Oral Q6H PRN Bobette Mo, MD       Or  . acetaminophen (TYLENOL) suppository 650 mg  650 mg Rectal Q6H PRN Bobette Mo, MD      . ascorbic acid (VITAMIN C) tablet 500 mg  500 mg Oral Daily Bobette Mo, MD   500 mg at 10/02/20 1037  . atorvastatin (LIPITOR) tablet 80 mg  80 mg Oral QPM Bobette Mo, MD      . calcium-vitamin D (OSCAL WITH D) 500-200 MG-UNIT per tablet 1 tablet  1 tablet Oral BID Bobette Mo, MD   1 tablet at 10/02/20 1038  . cholecalciferol (VITAMIN D) tablet 2,000 Units  2,000 Units Oral Daily Bobette Mo, MD   2,000 Units at 10/02/20 1036  . cyclobenzaprine (FLEXERIL) tablet 5 mg  5 mg Oral BID PRN Bobette Mo, MD      . diltiazem [facility] CD) 24 hr capsule 120 mg  120 mg Oral Daily Bobette Mo, MD   120 mg at 10/02/20 1039  . docusate sodium (COLACE) capsule 100 mg  100 mg Oral Daily PRN Bobette Mo, MD      . DULoxetine (CYMBALTA) DR capsule 60 mg  60 mg Oral Daily Bobette Mo, MD   60 mg at 10/02/20 1037  . gabapentin (NEURONTIN) capsule 100 mg  100 mg Oral TID Bobette Mo, MD   100 mg at 10/02/20 1036  . guaiFENesin (MUCINEX) 12 hr tablet 600 mg  600 mg Oral BID PRN Bobette Mo, MD      . ipratropium (ATROVENT) nebulizer solution 0.5 mg  0.5 mg Nebulization Q6H PRN Bobette Mo, MD      . lactated ringers infusion   Intravenous Continuous  Bobette Mo, MD 75 mL/hr at 10/02/20 0006 New Bag at 10/02/20 0006  . levalbuterol (XOPENEX) nebulizer solution 1.25 mg  1.25 mg Nebulization Q6H PRN Bobette Mo, MD      . levofloxacin [facility]) IVPB 750 mg  750 mg Intravenous Q48H Bobette Mo, MD   Stopped at 10/02/20 0730  . magnesium oxide (MAG-OX) tablet 400 mg  400 mg Oral Daily Bobette Mo, MD   400 mg at 10/02/20 1038  . methylPREDNISolone sodium succinate (SOLU-MEDROL) 40 mg/mL injection 40 mg  40 mg Intravenous Q12H Bobette Mo, MD   40 mg at 10/02/20 0546   Followed by  . [START ON 10/03/2020] predniSONE (DELTASONE) tablet 40 mg  40 mg Oral Q breakfast Bobette Mo, MD      . ondansetron [facility]) tablet 4 mg  4 mg Oral Q6H PRN Bobette Mo, MD       Or  . ondansetron [facility]) injection 4 mg  4 mg Intravenous Q6H  PRN Bobette Mo, MD      . pantoprazole (PROTONIX) EC tablet 40 mg  40 mg Oral Daily Bobette Mo, MD   40 mg at 10/02/20 1037  . rOPINIRole (REQUIP) tablet 2 mg  2 mg Oral QHS Bobette Mo, MD      . senna [facility]) tablet 8.6 mg  1 tablet Oral PRN Bobette Mo, MD      . vancomycin (VANCOREADY) IVPB 500 mg/100 mL  500 mg Intravenous Q24H Otelia Sergeant, RPH      . Warfarin - Pharmacist Dosing Inpatient   Does not apply q1600 Bobette Mo, MD       Current Outpatient Medications  Medication Sig Dispense Refill  . acetaminophen (TYLENOL) 650 MG CR tablet Take 650 mg by mouth every 8 (eight) hours as needed for pain.    [name] alendronate (FOSAMAX) 70 MG tablet Take 70 mg by mouth once a week.     [name] ascorbic acid (VITAMIN C) 500 MG tablet Take 500 mg by mouth daily.    [name] atorvastatin (LIPITOR) 80 MG tablet Take 80 mg by mouth every evening.     . Calcium Carb-Cholecalciferol (OYSTER SHELL CALCIUM) 500-400 MG-UNIT TABS Take 1 tablet by mouth 2 (two) times daily.    . Cholecalciferol (VITAMIN D3) 1000 UNITS CAPS Take 2,000 Units by mouth every  30 (thirty) days.     . clobetasol cream (TEMOVATE) 0.05 % Apply 1 application topically 2 (two) times daily.    . cyclobenzaprine (FLEXERIL) 5 MG tablet Take 5 mg by mouth 2 (two) times daily as needed for muscle spasms.    [name] diltiazem (CARDIZEM CD) 120 MG 24 hr capsule Take 1 capsule (120 mg total) by mouth daily. 90 capsule 3  . docusate sodium (COLACE) 100 MG capsule Take 100 mg by mouth daily as needed for mild constipation.    . DULoxetine (CYMBALTA) 60 MG capsule Take 60 mg by mouth daily.     . fluticasone (FLONASE) 50 MCG/ACT nasal spray 2 sprays by Each Nare route daily as needed for allergies.    [name] gabapentin (NEURONTIN) 100 MG capsule Take 100 mg by mouth 3 (three) times daily.    [name] guaiFENesin (MUCINEX) 600 MG 12 hr tablet Take 600 mg by mouth 2 (two) times daily as needed for cough or to loosen phlegm.    . magnesium oxide (MAG-OX) 400 MG tablet Take 400 mg by mouth daily.     . pantoprazole (PROTONIX) 40 MG tablet Take 40 mg by mouth daily.     [name] rOPINIRole (REQUIP) 2 MG tablet Take 2 mg by mouth at bedtime.     . senna (SENOKOT) 8.6 MG tablet Take 1 tablet by mouth as needed for constipation.    [name] warfarin (COUMADIN) 2.5 MG tablet Take 2.5 mg by mouth as directed. Take on Monday, Wednesday, Thursday, Friday, Sunday    . warfarin (COUMADIN) 3 MG tablet Take 3 mg by mouth as directed. Take on Tuesday and Saturday    . FLUoxetine (PROZAC) 20 MG capsule Take 20 mg by mouth daily.       Discharge Medications: Please see discharge summary for a list of discharge medications.  Relevant Imaging Results:  Relevant Lab Results:   Additional Information SSN: [id number]  Trenton Founds, RN

## 2020-10-02 NOTE — Progress Notes (Signed)
Pharmacy Antibiotic Note  Rhonda Griffith is a 75 y.o. female admitted on 10/01/2020 with CAP.  Pharmacy has been consulted for Vancomycin and Levaquin dosing.  Pt admitted for SOB w/ hx of Afib, mechanical valve (2010), CHF, CKD, CVA, & pacemaker.C-Xray: "Low lung volumes. Diffuse bilateral interstitial prominence with prominent alveolar infiltrates/edema right lung. CHF and/or pneumonia could present in this fashion."  Pt hx includes allergy to Cephalosporins causing rash.  Unable to locate evidence pt has been able to take cephalosporin, via search of Care Everywhere, since allergy documented in 2013.  Plan: Pt ordered Levaquin q24hr for 5 days.  Adjusted dose to Levaquin 750mg  q48hr for 5 days based on renal function for pneumonia.  Pt received LD of Vancomycin 1750 mg once.  Ordered Vancomycin 500 mg IV Q 24 hrs. Goal AUC 400-550. Expected AUC: 505  SCr used: 1.94  Pharmacy will monitor for lab culture results. Pharmacy will continue to follow SCr and will adjust abx dosing if warranted.   Height: 5\' 9"  (175.3 cm) Weight: 74.8 kg (165 lb) IBW/kg (Calculated) : 66.2  Temp (24hrs), Avg:98.6 F (37 C), Min:98.5 F (36.9 C), Max:98.7 F (37.1 C)  Recent Labs  Lab 10/01/20 1355 10/01/20 1810 10/01/20 1900 10/02/20 0407  WBC 19.2*  --   --  16.7*  CREATININE 1.94*  --   --  1.41*  LATICACIDVEN  --  1.6 1.5  --     Estimated Creatinine Clearance: 35.5 mL/min (A) (by C-G formula based on SCr of 1.41 mg/dL (H)).    Allergies  Allergen Reactions  . Cephalexin Rash  . Latex Rash  . Morphine Nausea And Vomiting  . Tizanidine Palpitations  . Liothyronine Other (See Comments)  . Other Other (See Comments)    Several different statins.  . Statins Other (See Comments)    Several different statins.  . Tape Other (See Comments)    Breaks her skin Allergic to plastic/latex tape. Only use paper tape on patient. Breaks her skin: Allergic to plastic/latex tape. Only use  paper tape on patient.  . Tapentadol Other (See Comments)  . Sulfa Antibiotics Rash and Other (See Comments)    Antimicrobials this admission: 01/06 Aztreonam >> x 1 01/07 Levaquin >> X 5 days 01/07 Vancomycin   Microbiology results: 01/06 BCx: Pending 01/06 Sputum: Pending    Thank you for allowing pharmacy to be a part of this patient's care.  03/06, PharmD, [facility] 10/02/2020 6:14 AM

## 2020-10-02 NOTE — Progress Notes (Signed)
*  PRELIMINARY RESULTS* Echocardiogram 2D Echocardiogram has been performed.  Cristela Blue 10/02/2020, 12:34 PM

## 2020-10-02 NOTE — TOC Initial Note (Signed)
Transition of Care [facility]) - Initial/Assessment Note    Patient Details  Name: Rhonda Griffith MRN: [id number] Date of Birth: 1945/12/07  [facility]) CM/SW Contact:    Trenton Founds, RN Phone Number: 10/02/2020, 1:46 PM  Clinical Narrative:  RNCM reached out to patient's daughter Lupita Leash for assessment per request of patient to discuss discharge planning. Patient lives at home with daughter and son in law and is normally independent up until recently when she has been experiencing much more weakness. She is normally independent at baseline with a walker. Discussed with Lupita Leash that current recommendation is for SNF, she verbalizes understanding and agreement and reports patient has needed this before and they understand the process. She is agreeable to a bed search of all local facilities but reports she would like for patient to be able to go back to [facility] in [place] if they have availability.  RNCM verified PASSR, completed FL2 and started bed search.             Expected Discharge Plan: Skilled Nursing Facility Barriers to Discharge: No Barriers Identified   Patient Goals and CMS Choice        Expected Discharge Plan and Services Expected Discharge Plan: Skilled Nursing Facility       Living arrangements for the past 2 months: Single Family Home                                      Prior Living Arrangements/Services Living arrangements for the past 2 months: Single Family Home Lives with:: Adult Children Patient language and need for interpreter reviewed:: Yes Do you feel safe going back to the place where you live?: Yes      Need for Family Participation in Patient Care: Yes (Comment) Care giver support system in place?: Yes (comment)   Criminal Activity/Legal Involvement Pertinent to Current Situation/Hospitalization: No - Comment as needed  Activities of Daily Living      Permission Sought/Granted                  Emotional  Assessment              Admission diagnosis:  CAP (community acquired pneumonia) [J18.9] Patient Active Problem List   Diagnosis Date Noted  . CAP (community acquired pneumonia) 10/01/2020  . AKI (acute kidney injury) (HCC) 10/01/2020  . Elevated troponin 10/01/2020  . Normocytic anemia 10/01/2020  . BRBPR (bright red blood per rectum) 09/10/2020  . Closed right hip fracture, initial encounter (HCC) 04/10/2019  . Degeneration of lumbar intervertebral disc 02/21/2019  . Lumbar radiculopathy 02/21/2019  . Spinal stenosis of lumbar region 02/21/2019  . Cellulitis 08/30/2018  . Closed fracture of distal end of left fibula 06/28/2018  . Acute respiratory failure with hypoxia (HCC) 10/14/2016  . 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 Influenza A 10/14/2016  . Status post cardiac pacemaker procedure 10/14/2016  . Stroke (HCC) 10/14/2016  . ERRONEOUS ENCOUNTER--DISREGARD 07/03/2016  . Carotid bruit 12/22/2015  . Hyperlipidemia 10/23/2015  . Atrial fibrillation, permanent (HCC)   . Pacemaker -Medtronic   . 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 Chronic kidney disease, unspecified   . Encounter for anticoagulation discussion and counseling 06/25/2015  . Acute cardioembolic stroke (HCC) 06/25/2015  . PAD (peripheral artery disease) (HCC) 06/25/2015  . Pure hypercholesterolemia 04/30/2015  . History of recent stroke 04/28/2015  . Essential hypertension 12/09/2014  .  Gait instability 09/08/2014  . Status post total bilateral knee replacement 06/24/2014  . UTI (urinary tract infection) 02/03/2014  . Fatigue 10/01/2013  . Shortness of breath 09/12/2012  . Stress disorder, acute 09/12/2012  . Tachycardia 09/12/2012  . Atrial fibrillation (HCC) 09/12/2012  . Melanoma in situ (HCC) 07/11/2012  . Morphea 07/11/2012  . Long term (current) use of anticoagulants 07/02/2012  . History of malignant melanoma of skin 05/30/2012  . CMC arthritis 02/02/2012  . Sprain of MCL (medial collateral  ligament) of knee 01/24/2012  . Squamous cell carcinoma 12/21/2011  . Carpal tunnel syndrome of left wrist 11/28/2011  . Sinoatrial node dysfunction (HCC)   . Movement disorder 08/08/2011  . Edema 06/07/2011  . Chronic diastolic heart failure (HCC) 05/27/2011  . Anxiety state 05/14/2011  . Esophageal reflux 05/14/2011  . Mitral valve replaced 05/14/2011  . Myalgia and myositis, unspecified 05/14/2011  . OSA (obstructive sleep apnea) 05/14/2011  . Osteoarthritis 05/14/2011  . Pulmonary hypertension (HCC) 05/14/2011  . Cardiac pacemaker in situ 10/03/2010   PCP:  [name], MD Pharmacy:   [facility] [address], [place] - [address] [place] [address] Phone: [phone] Fax: [phone]     Social Determinants of Health (SDOH) Interventions    Readmission Risk Interventions Readmission Risk Prevention Plan 04/14/2019  Transportation Screening Complete  PCP or Specialist Appt within 3-5 Days Complete  HRI or Home Care Consult Not Complete  HRI or Home Care Consult comments Pending PT recs  Social Work Consult for Recovery Care Planning/Counseling Complete  Palliative Care Screening Not Applicable  Medication Review Oceanographer) Not Complete  Med Review Comments To be done at time of discharge  Some recent data might be hidden

## 2020-10-02 NOTE — Consult Note (Signed)
ANTICOAGULATION CONSULT NOTE   Pharmacy Consult for Warfarin Indication: atrial fibrillation  Allergies  Allergen Reactions  . Cephalexin Rash  . Latex Rash  . Morphine Nausea And Vomiting  . Tizanidine Palpitations  . Liothyronine Other (See Comments)  . Other Other (See Comments)    Several different statins.  . Statins Other (See Comments)    Several different statins.  . Tape Other (See Comments)    Breaks her skin Allergic to plastic/latex tape. Only use paper tape on patient. Breaks her skin: Allergic to plastic/latex tape. Only use paper tape on patient.  . Tapentadol Other (See Comments)  . Sulfa Antibiotics Rash and Other (See Comments)    Patient Measurements: Height: 5\' 9"  (175.3 cm) Weight: 74.8 kg (165 lb) IBW/kg (Calculated) : 66.2  Vital Signs: BP: 130/75 (01/07 0630) Pulse Rate: 87 (01/07 0645)  Labs: Recent Labs    10/01/20 1355 10/01/20 1601 10/02/20 0407  HGB 10.9*  --  10.4*  HCT 34.8*  --  31.8*  PLT 257  --  227  LABPROT 30.8*  --  33.7*  INR 3.1*  --  3.5*  CREATININE 1.94*  --  1.41*  TROPONINIHS 80* 81*  --     Estimated Creatinine Clearance: 35.5 mL/min (A) (by C-G formula based on SCr of 1.41 mg/dL (H)).   Medical History: Past Medical History:  Diagnosis Date  . Atrial fibrillation, permanent (HCC)    a. CHA2DS2VASc = 4-->coumadin;  b. 09/2014 Echo: EF 55-60%, normal wall motion, mild AI/AS, nl MV, mod dil LA, mildly dil RA, mild to mod TR, PASP 10/2014.  . CHF (congestive heart failure) (HCC)   . Chronic kidney disease, stage I   . CVA (cerebral infarction)    a. 04/2015 - anticoagulation switched from eliquis to xarelto to coumadin.  05/2015 Hypertension   . Leukocytosis   . Pacemaker -Medtronic    a. implant for SSS  . PAD (peripheral artery disease) (HCC)    a. w/ left lower ext claudication s/p ABI's 04/2015 showing nl ABI on Right with abnl waveforms on left sugg of L SFA dzs.  05/2015 Restless leg syndrome   . Rheumatic mitral  valve and aortic valve stenosis    a. s/p mechanical valve replaced with bovine valve 2010.    Medications:  Warfarin home regimen: 3 mg Tues,Sat; 2.5 mg M-W-Th-F,Sun (total weekly dose = 18.5 mg) -- [Current home regimen obtained from most recent coag clinic visit and recent hospital discharge.]  IP Meds: levofloxacin x5 days, Vancomycin, prednisone  Assessment: 75 y.o. female with history of atrial fibrillation with mitral valve repair on warfarin (INR 2-3) and GIB. Presented to ED with SOB with a associated cough and right lower chest pain. INR 3.1 (slightly supratherapeutic) on admission. Pharmacy consulted to dose warfarin while while inpatient.  Patient follows with anticoagulation clinic outpatient (see medications above).   Pt on new 5 day course of Levaquin for CAP treatment and stress dose steroids.  Hgb:10.9>10.4 Plt: 257>227  DATE INR WAFARIN DOSE 1/6 3.1 Held 1/7 3.5 ___   Goal of Therapy:  INR 2-3 Monitor platelets by anticoagulation protocol: Yes   Plan:   INR remains supratherapeutic (3.1>3.5).  With omission of 1/06 dose, will continue to hold this evening. H/H low/stable. When dosing resumes, consider lower restart during antibiotic course.  Recheck INR with morning labs  Continue to follow H&H   3/06, PharmD, BCPS Clinical Pharmacist  10/02/2020,9:52 AM

## 2020-10-02 NOTE — ED Notes (Signed)
Pt changed and cleaned up, new brief and sheets placed, I&O for urine sample done.  Pt comfortable and denies needs at this time.

## 2020-10-03 DIAGNOSIS — I4821 Permanent atrial fibrillation: Secondary | ICD-10-CM | POA: Diagnosis not present

## 2020-10-03 DIAGNOSIS — N179 Acute kidney failure, unspecified: Secondary | ICD-10-CM | POA: Diagnosis not present

## 2020-10-03 DIAGNOSIS — I5032 Chronic diastolic (congestive) heart failure: Secondary | ICD-10-CM | POA: Diagnosis not present

## 2020-10-03 DIAGNOSIS — J189 Pneumonia, unspecified organism: Secondary | ICD-10-CM | POA: Diagnosis not present

## 2020-10-03 LAB — BASIC METABOLIC PANEL
Anion gap: 9 (ref 5–15)
BUN: 42 mg/dL — ABNORMAL HIGH (ref 8–23)
CO2: 25 mmol/L (ref 22–32)
Calcium: 10 mg/dL (ref 8.9–10.3)
Chloride: 103 mmol/L (ref 98–111)
Creatinine, Ser: 1.13 mg/dL — ABNORMAL HIGH (ref 0.44–1.00)
GFR, Estimated: 50 mL/min — ABNORMAL LOW (ref >60)
Glucose, Bld: 126 mg/dL — ABNORMAL HIGH (ref 70–99)
Potassium: 3.7 mmol/L (ref 3.5–5.1)
Sodium: 137 mmol/L (ref 135–145)

## 2020-10-03 LAB — CBC WITH DIFFERENTIAL/PLATELET
Abs Immature Granulocytes: 0.13 10*3/uL — ABNORMAL HIGH (ref 0.00–0.07)
Basophils Absolute: 0 10*3/uL (ref 0.0–0.1)
Basophils Relative: 0 %
Eosinophils Absolute: 0 10*3/uL (ref 0.0–0.5)
Eosinophils Relative: 0 %
HCT: 31.2 % — ABNORMAL LOW (ref 36.0–46.0)
Hemoglobin: 9.9 g/dL — ABNORMAL LOW (ref 12.0–15.0)
Immature Granulocytes: 1 %
Lymphocytes Relative: 2 %
Lymphs Abs: 0.3 10*3/uL — ABNORMAL LOW (ref 0.7–4.0)
MCH: 30.7 pg (ref 26.0–34.0)
MCHC: 31.7 g/dL (ref 30.0–36.0)
MCV: 96.9 fL (ref 80.0–100.0)
Monocytes Absolute: 0.8 10*3/uL (ref 0.1–1.0)
Monocytes Relative: 6 %
Neutro Abs: 12.8 10*3/uL — ABNORMAL HIGH (ref 1.7–7.7)
Neutrophils Relative %: 91 %
Platelets: 247 10*3/uL (ref 150–400)
RBC: 3.22 MIL/uL — ABNORMAL LOW (ref 3.87–5.11)
RDW: 12.7 % (ref 11.5–15.5)
WBC: 14.1 10*3/uL — ABNORMAL HIGH (ref 4.0–10.5)
nRBC: 0.1 % (ref 0.0–0.2)

## 2020-10-03 LAB — PROCALCITONIN: Procalcitonin: 8.25 ng/mL

## 2020-10-03 LAB — PROTIME-INR
INR: 4 — ABNORMAL HIGH (ref 0.8–1.2)
Prothrombin Time: 37.8 seconds — ABNORMAL HIGH (ref 11.4–15.2)

## 2020-10-03 NOTE — ED Notes (Signed)
Pt noted to have urinated in brief, pt unaware she urinated. Pt linens changed and placed in new brief. Pt placed on purewick due to incontinence.

## 2020-10-03 NOTE — ED Notes (Signed)
Messaged admitting MD Pahwani regarding pt coming off Bipap at this time.

## 2020-10-03 NOTE — ED Notes (Signed)
Upon assessing patient, the patient had the BiPAP mask in her hands. This RN attempted to replace it but was unsuccessful d/t patient pulling the entire apparatus apart. Respiratory called to fix mask.  Call bell within reach. Patient denies other needs at this time.

## 2020-10-03 NOTE — Consult Note (Signed)
ANTICOAGULATION CONSULT NOTE   Pharmacy Consult for Warfarin Indication: atrial fibrillation  Allergies  Allergen Reactions  . Cephalexin Rash  . Latex Rash  . Morphine Nausea And Vomiting  . Tizanidine Palpitations  . Liothyronine Other (See Comments)  . Other Other (See Comments)    Several different statins.  . Statins Other (See Comments)    Several different statins.  . Tape Other (See Comments)    Breaks her skin Allergic to plastic/latex tape. Only use paper tape on patient. Breaks her skin: Allergic to plastic/latex tape. Only use paper tape on patient.  . Tapentadol Other (See Comments)  . Sulfa Antibiotics Rash and Other (See Comments)    Patient Measurements: Height: 5\' 9"  (175.3 cm) Weight: 74.8 kg (165 lb) IBW/kg (Calculated) : 66.2  Vital Signs: Temp: 98.5 F (36.9 C) (01/07 2245) Temp Source: Oral (01/07 2245) BP: 130/91 (01/08 0510) Pulse Rate: 62 (01/08 0510)  Labs: Recent Labs    10/01/20 1355 10/01/20 1601 10/02/20 0407 10/03/20 0436  HGB 10.9*  --  10.4* 9.9*  HCT 34.8*  --  31.8* 31.2*  PLT 257  --  227 247  LABPROT 30.8*  --  33.7* 37.8*  INR 3.1*  --  3.5* 4.0*  CREATININE 1.94*  --  1.41* 1.13*  TROPONINIHS 80* 81*  --   --     Estimated Creatinine Clearance: 44.3 mL/min (A) (by C-G formula based on SCr of 1.13 mg/dL (H)).   Medical History: Past Medical History:  Diagnosis Date  . Atrial fibrillation, permanent (HCC)    a. CHA2DS2VASc = 4-->coumadin;  b. 09/2014 Echo: EF 55-60%, normal wall motion, mild AI/AS, nl MV, mod dil LA, mildly dil RA, mild to mod TR, PASP 10/2014.  . CHF (congestive heart failure) (HCC)   . Chronic kidney disease, stage I   . CVA (cerebral infarction)    a. 04/2015 - anticoagulation switched from eliquis to xarelto to coumadin.  05/2015 Hypertension   . Leukocytosis   . Pacemaker -Medtronic    a. implant for SSS  . PAD (peripheral artery disease) (HCC)    a. w/ left lower ext claudication s/p ABI's 04/2015  showing nl ABI on Right with abnl waveforms on left sugg of L SFA dzs.  05/2015 Restless leg syndrome   . 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    Medications:  Warfarin home regimen: 3 mg Tues,Sat; 2.5 mg M-W-Th-F,Sun (total weekly dose = 18.5 mg) -- [Current home regimen obtained from most recent coag clinic visit and recent hospital discharge.]  IP Meds: levofloxacin x5 days, Vancomycin, prednisone  Assessment: 75 y.o. female with history of atrial fibrillation with mitral valve repair on warfarin (INR 2-3) and GIB. Presented to ED with SOB with a associated cough and right lower chest pain. INR 3.1 (slightly supratherapeutic) on admission. Pharmacy consulted to dose warfarin while while inpatient.  Patient follows with anticoagulation clinic outpatient (see medications above).   Pt on new 5 day course of Levaquin for CAP treatment and stress dose steroids.  Hgb:10.9>10.4 Plt: 257>227  DATE INR WAFARIN DOSE 1/6 3.1 Held 1/7 3.5 Held 1/8 4.0 Plan to hold   Goal of Therapy:  INR 2-3 Monitor platelets by anticoagulation protocol: Yes   Plan:   INR remains supratherapeutic (3.5>4.0).  With omission of 1/07 dose, will evaluate for s/sy of bleeding and consider oral vitamin k if present or INR trends higher. H/H remains  low/stable.  Continue to hold warfarin dose this evening.    When dosing resumes, consider lower restart during antibiotic course.  Recheck INR with morning labs  Continue to follow H&H  Martyn Malay, PharmD, BCPS Clinical Pharmacist  10/03/2020,7:56 AM

## 2020-10-03 NOTE — Progress Notes (Signed)
PROGRESS NOTE    Rhonda Griffith  NGE:[id number] DOB: 29-Dec-1945 DOA: 10/01/2020 PCP: [name], MD   Brief Narrative:  Patient is a 75 year old female with past medical history of A. 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  ED course: Afebrile, tachypneic, heart rate 81, blood pressure 108/78, oxygen saturation 93% on room air.  Received vancomycin and aztreonam and loading dose of Solu-Medrol, bronchodilators and BiPAP as needed.  She was also given lorazepam 0.5 mg IV once.  CBC shows leukocytosis of 19.2, hemoglobin: 10.9, INR: 3.1, troponin 80-81, BNP: 258, sodium: 133, lactic acid: WNL, LFTs: WNL.  Chest radiograph shows right lower lobe lobe airspace disease.  Patient admitted for further evaluation and management.  Assessment & Plan:   Acute hypoxemic respiratory failure in the setting of community-acquired pneumonia: -Patient presented with worsening shortness of breath, productive cough and requiring supplemental oxygen and BiPAP as needed. -Afebrile with leukocytosis, Lactic acid: WNL, COVID-19 negative. -Continue levofloxacin, IV steroids.  Discontinue vancomycin since MRSA PCR is negative. -Leukocytosis improving. -Urine Legionella antigen and urine strep pneumo antigen: Pending.  Follow blood culture result.  PCT: 27.17-trended down to 8.25 -Continue bronchodilators as needed.  Continue to monitor vitals closely.  AKI: -Renal function improving.  Monitor INO's. -Renal ultrasound shows bilateral renal cortical atrophy.  No hydronephrosis. -Repeat BMP tomorrow a.m.  Chronic diastolic CHF: Patient appears euvolemic on exam.  Chest x-ray shows no fluid overload.  BNP:  258 -Echo shows ejection fraction of 55 to 60%, diastolic parameters are indeterminate.  Severe TR. -Strict INO's and daily weight and monitor signs of fluid overload  Hypertension: Blood pressure is stable.  Continue diltiazem 120 mg p.o. daily.  Elevated troponin: Likely in the setting of demand ischemia in the setting of underlying infection.  Patient denies ACS symptoms.  Continue to monitor.  Echo as above.  Permanent A. Fib/history of rheumatic aortic and mitral valve stenosis status post mechanical heart valve: -Continue Cardizem and Coumadin as per pharmacy  Supratherapeutic INR: Continue to hold Coumadin.  No signs of active bleeding.  Hyperlipidemia: Continue statin  GERD: Continue PPI  OSA: Continue BiPAP as needed  Normocytic anemia: Continue to monitor H&H closely.  Reviewed iron panel.  Restless leg syndrome: Continue Requip  Debility: PT/OT recommended SNF.    DVT prophylaxis: Hold Coumadin due to supratherapeutic INR  code Status: Full code Family Communication: None present at bedside.  Plan of care discussed with patient in length and she verbalized understanding and agreed with it. Disposition Plan: To be determined  Consultants:   None  Procedures:   Echo  Antimicrobials:   Levaquin  Vancomycin  Status is: Inpatient   Dispo: The patient is from: Home              Anticipated d/c is to: SNF              Anticipated d/c date is: 2 days              Patient currently is not medically stable to d/c.    Subjective: Patient seen and examined in ED.  On nasal cannula.  Tells me that she feels better this morning.  Denies chest pain, worsening shortness of breath, leg swelling, orthopnea, PND, fever or  chills.  Denies bleeding from any  site of body.  Objective: Vitals:   10/03/20 0945 10/03/20 0947 10/03/20 1000 10/03/20 1200  BP: (!) 162/81 (!) 162/81 (!) 156/134 (!) 133/107  Pulse: (!) 59  (!) 108 65  Resp: (!) 21  (!) 32 20  Temp:    98  F (36.7 C)  TempSrc:    Oral  SpO2: 96%  96% 96%  Weight:      Height:        Intake/Output Summary (Last 24 hours) at 10/03/2020 1236 Last data filed at 10/02/2020 2246 Gross per 24 hour  Intake 1000 ml  Output -  Net 1000 ml   Filed Weights   10/01/20 1339  Weight: 74.8 kg    Examination:  General exam: Appears calm and comfortable on nasal cannula,, thin and lean, elderly looking, communicating well Respiratory system: Mild expiratory wheezing noted on the bases.   Cardiovascular system: Regular rate rhythm, no pedal swelling. Gastrointestinal system: Abdomen is nondistended, soft and nontender. No organomegaly or masses felt. Normal bowel sounds heard. Central nervous system: Alert and oriented. No focal neurological deficits. Extremities: Symmetric 5 x 5 power. Skin: No rashes, lesions or ulcers Psychiatry: Judgement and insight appear normal. Mood & affect appropriate.    Data Reviewed: I have personally reviewed following labs and imaging studies  CBC: Recent Labs  Lab 10/01/20 1355 10/02/20 0407 10/03/20 0436  WBC 19.2* 16.7* 14.1*  NEUTROABS  --  15.6* 12.8*  HGB 10.9* 10.4* 9.9*  HCT 34.8* 31.8* 31.2*  MCV 97.5 96.4 96.9  PLT 257 227 247   Basic Metabolic Panel: Recent Labs  Lab 10/01/20 1355 10/02/20 0407 10/03/20 0436  NA 133* 137 137  K 4.2 4.0 3.7  CL 98 104 103  CO2 23 23 25   GLUCOSE 104* 151* 126*  BUN 45* 44* 42*  CREATININE 1.94* 1.41* 1.13*  CALCIUM 10.1 9.4 10.0   GFR: Estimated Creatinine Clearance: 44.3 mL/min (A) (by C-G formula based on SCr of 1.13 mg/dL (H)). Liver Function Tests: Recent Labs  Lab 10/01/20 1601  AST 33  ALT 14  ALKPHOS 83  BILITOT 1.2  PROT 7.2  ALBUMIN 3.5   No results for input(s): LIPASE, AMYLASE in the last 168 hours. No results for input(s): AMMONIA in the last 168 hours. Coagulation Profile: Recent Labs  Lab 10/01/20 1355 10/02/20 0407 10/03/20 0436  INR 3.1* 3.5* 4.0*   Cardiac  Enzymes: No results for input(s): CKTOTAL, CKMB, CKMBINDEX, TROPONINI in the last 168 hours. BNP (last 3 results) No results for input(s): PROBNP in the last 8760 hours. HbA1C: No results for input(s): HGBA1C in the last 72 hours. CBG: No results for input(s): GLUCAP in the last 168 hours. Lipid Profile: No results for input(s): CHOL, HDL, LDLCALC, TRIG, CHOLHDL, LDLDIRECT in the last 72 hours. Thyroid Function Tests: No results for input(s): TSH, T4TOTAL, FREET4, T3FREE, THYROIDAB in the last 72 hours. Anemia Panel: Recent Labs    10/02/20 0407  VITAMINB12 282  FOLATE 9.0  FERRITIN 137  TIBC 217*  IRON 13*  RETICCTPCT 0.8   Sepsis Labs: Recent Labs  Lab 10/01/20 1355 10/01/20 1810 10/01/20 1900 10/03/20 0436  PROCALCITON 27.17  --   --  8.25  LATICACIDVEN  --  1.6 1.5  --     Recent Results (from the past 240 hour(s))  Culture, blood (routine x 2)     Status: None (Preliminary result)   Collection Time: 10/01/20  7:00 PM  Specimen: BLOOD  Result Value Ref Range Status   Specimen Description BLOOD BLOOD RIGHT FOREARM  Final   Special Requests   Final    BOTTLES DRAWN AEROBIC AND ANAEROBIC Blood Culture results may not be optimal due to an inadequate volume of blood received in culture bottles   Culture   Final    NO GROWTH 2 DAYS Performed at [facility], [address] [place], [place] [address]    Report Status PENDING  Incomplete  Culture, blood (routine x 2)     Status: None (Preliminary result)   Collection Time: 10/01/20  7:00 PM   Specimen: BLOOD  Result Value Ref Range Status   Specimen Description BLOOD BLOOD LEFT FOREARM  Final   Special Requests   Final    BOTTLES DRAWN AEROBIC AND ANAEROBIC Blood Culture adequate volume   Culture   Final    NO GROWTH 2 DAYS Performed at [facility], [address] [place], [place] [address]    Report Status PENDING  Incomplete  Resp panel by RT-PCR (RSV, Flu A&B, Covid) Nasopharyngeal  Swab     Status: None   Collection Time: 10/01/20  7:00 PM   Specimen: Nasopharyngeal Swab; Nasopharyngeal(NP) swabs in vial transport medium  Result Value Ref Range Status   SARS Coronavirus 2 by RT PCR NEGATIVE NEGATIVE Final    Comment: (NOTE) SARS-CoV-2 target nucleic acids are NOT DETECTED.  The SARS-CoV-2 RNA is generally detectable in upper respiratory specimens during the acute phase of infection. The lowest concentration of SARS-CoV-2 viral copies this assay can detect is 138 copies/mL. A negative result does not preclude SARS-Cov-2 infection and should not be used as the sole basis for treatment or other patient management decisions. A negative result may occur with  improper specimen collection/handling, submission of specimen other than nasopharyngeal swab, presence of viral mutation(s) within the areas targeted by this assay, and inadequate number of viral copies(<138 copies/mL). A negative result must be combined with clinical observations, patient history, and epidemiological information. The expected result is Negative.  Fact Sheet for Patients:  BloggerCourse.comhttps://www.fda.gov/media/152166/download  Fact Sheet for Healthcare Providers:  SeriousBroker.ithttps://www.fda.gov/media/152162/download  This test is no t yet approved or cleared by the [place] FDA and  has been authorized for detection and/or diagnosis of SARS-CoV-2 by FDA under an Emergency Use Authorization (EUA). This EUA will remain  in effect (meaning this test can be used) for the duration of the COVID-19 declaration under Section 564(b)(1) of the Act, 21 U.S.C.section 360bbb-3(b)(1), unless the authorization is terminated  or revoked sooner.       Influenza A by PCR NEGATIVE NEGATIVE Final   Influenza B by PCR NEGATIVE NEGATIVE Final    Comment: (NOTE) The Xpert Xpress SARS-CoV-2/FLU/RSV plus assay is intended as an aid in the diagnosis of influenza from Nasopharyngeal swab specimens and should not be used as a sole  basis for treatment. Nasal washings and aspirates are unacceptable for Xpert Xpress SARS-CoV-2/FLU/RSV testing.  Fact Sheet for Patients: BloggerCourse.comhttps://www.fda.gov/media/152166/download  Fact Sheet for Healthcare Providers: SeriousBroker.ithttps://www.fda.gov/media/152162/download  This test is not yet approved or cleared by the [place] FDA and has been authorized for detection and/or diagnosis of SARS-CoV-2 by FDA under an Emergency Use Authorization (EUA). This EUA will remain in effect (meaning this test can be used) for the duration of the COVID-19 declaration under Section 564(b)(1) of the Act, 21 U.S.C. section 360bbb-3(b)(1), unless the authorization is terminated or revoked.     Resp Syncytial Virus by PCR  NEGATIVE NEGATIVE Final    Comment: (NOTE) Fact Sheet for Patients: BloggerCourse.com  Fact Sheet for Healthcare Providers: SeriousBroker.it  This test is not yet approved or cleared by the [place] FDA and has been authorized for detection and/or diagnosis of SARS-CoV-2 by FDA under an Emergency Use Authorization (EUA). This EUA will remain in effect (meaning this test can be used) for the duration of the COVID-19 declaration under Section 564(b)(1) of the Act, 21 U.S.C. section 360bbb-3(b)(1), unless the authorization is terminated or revoked.  Performed at [facility], [address] [place], [place] [address]   MRSA PCR Screening     Status: None   Collection Time: 10/02/20 10:56 AM   Specimen: Nasopharyngeal  Result Value Ref Range Status   MRSA by PCR NEGATIVE NEGATIVE Final    Comment:        The GeneXpert MRSA Assay (FDA approved for NASAL specimens only), is one component of a comprehensive MRSA colonization surveillance program. It is not intended to diagnose MRSA infection nor to guide or monitor treatment for MRSA infections. Performed at [facility], [address]  [place], [place] [address]       Radiology Studies: DG Chest 2 View  Result Date: 10/01/2020 CLINICAL DATA:  Shortness of breath. EXAM: CHEST - 2 VIEW COMPARISON:  04/27/2020. FINDINGS: Cardiac pacer in stable position. Prior median sternotomy and cardiac valve replacement. Cardiomegaly. Low lung volumes. Diffuse bilateral interstitial prominence with prominent alveolar infiltrates/edema right lung. CHF and/or pneumonia could present this fashion. Small right pleural effusion. No pneumothorax. Degenerative changes scoliosis thoracic spine. Surgical clips right upper quadrant. IMPRESSION: 1. Cardiac pacer in stable position. Prior median sternotomy and cardiac valve replacement. Cardiomegaly. 2. Low lung volumes. Diffuse bilateral interstitial prominence with prominent alveolar infiltrates/edema right lung. CHF and/or pneumonia could present in this fashion. Electronically Signed   By: Maisie Fus  Register   On: 10/01/2020 14:14   US RENAL  Result Date: 10/02/2020 CLINICAL DATA:  Acute renal insufficiency EXAM: RENAL / URINARY TRACT ULTRASOUND COMPLETE COMPARISON:  None. FINDINGS: Right Kidney: Renal measurements: 9.4 x 4.8 x 4.8 cm = volume: 112 mL. There is mild renal cortical atrophy noted. The renal cortical echogenicity is within normal limits. 7 mm simple cortical cyst is seen within the interpolar region of the right kidney. Left Kidney: Renal measurements: 9.2 x 4.8 x 3.8 cm = volume: 89 mL. There is mild slightly asymmetric cortical atrophy of the left kidney. Renal parenchymal echogenicity is within normal limits. Multiple simple exophytic cortical cysts are seen arising from the left kidney measuring up to 1.9 cm. Bladder: Appears normal for degree of bladder distention. Bilateral ureteral jets are identified. Other: None. IMPRESSION: Bilateral renal cortical atrophy.  No hydronephrosis. Electronically Signed   By: Helyn Numbers MD   On: 10/02/2020 05:52   ECHOCARDIOGRAM COMPLETE  Result Date:  10/02/2020    ECHOCARDIOGRAM REPORT   Patient Name:   Rhonda Griffith Date of Exam: 10/02/2020 Medical Rec #:  [id number]                 Height:       69.0 in Accession #:    [id number]                Weight:       165.0 lb Date of Birth:  [birth year]                  BSA:  1.904 m Patient Age:    74 years                  BP:           130/75 mmHg Patient Gender: F                         HR:           87 bpm. Exam Location:  [facility] Procedure: 2D Echo, Cardiac Doppler and Color Doppler Indications:     phtn 416.8 Atrial Fibrllation I48.91  History:         Patient has prior history of Echocardiogram examinations, most                  recent 01/10/2017. Risk Factors:Hypertension. Mitral valve                  replacement.  Sonographer:     Cristela Blue RDCS (AE) Referring Phys:  [id number] DAVID Kelby Fam ORTIZ Diagnosing Phys: Julien Nordmann MD  Sonographer Comments: No apical window and Technically challenging study due to limited acoustic windows. IMPRESSIONS  1. Left ventricular ejection fraction, by estimation, is 55 to 60%. The left ventricle has normal function. The left ventricle has no regional wall motion abnormalities. Left ventricular diastolic parameters are indeterminate.  2. Right ventricular systolic function is normal. The right ventricular size is normal. There is moderately elevated pulmonary artery systolic pressure. The estimated right ventricular systolic pressure is 47.5 mmHg.  3. Left atrial size was mildly dilated.  4. The mitral valve was not well visualized. No evidence of mitral valve regurgitation. No evidence of mitral stenosis. Severe mitral annular calcification. (consider repeat TTE once off bipap for better evaluation, estimation of gradient).  5. Tricuspid valve regurgitation is severe.  6. The aortic valve has moderaet calcification without any evidence of aortic stenosis.  7. There is mild dilatation of the aortic root, measuring 41 mm. FINDINGS  Left Ventricle: Left  ventricular ejection fraction, by estimation, is 55 to 60%. The left ventricle has normal function. The left ventricle has no regional wall motion abnormalities. The left ventricular internal cavity size was normal in size. There is  no left ventricular hypertrophy. Left ventricular diastolic parameters are indeterminate. Right Ventricle: The right ventricular size is normal. No increase in right ventricular wall thickness. Right ventricular systolic function is normal. There is moderately elevated pulmonary artery systolic pressure. The tricuspid regurgitant velocity is 3.26 m/s, and with an assumed right atrial pressure of 5 mmHg, the estimated right ventricular systolic pressure is 47.5 mmHg. Left Atrium: Left atrial size was mildly dilated. Right Atrium: Right atrial size was normal in size. Pericardium: There is no evidence of pericardial effusion. Mitral Valve: The mitral valve was not well visualized. There is severe calcification of the mitral valve leaflet(s). Severe mitral annular calcification. No evidence of mitral valve regurgitation. No evidence of mitral valve stenosis. Tricuspid Valve: The tricuspid valve is normal in structure. Tricuspid valve regurgitation is severe. No evidence of tricuspid stenosis. Aortic Valve: The aortic valve is normal in structure. Aortic valve regurgitation is not visualized. Mild to moderate aortic valve sclerosis/calcification is present, without any evidence of aortic stenosis. Pulmonic Valve: The pulmonic valve was normal in structure. Pulmonic valve regurgitation is not visualized. No evidence of pulmonic stenosis. Aorta: The aortic root is normal in size and structure. There is mild dilatation of the aortic root, measuring 41 mm. Venous: The inferior vena  cava is normal in size with greater than 50% respiratory variability, suggesting right atrial pressure of 3 mmHg. IAS/Shunts: No atrial level shunt detected by color flow Doppler. Additional Comments: A pacer wire is  visualized.  LEFT VENTRICLE PLAX 2D LVIDd:         4.62 cm LVIDs:         3.22 cm LV PW:         1.11 cm LV IVS:        1.19 cm LVOT diam:     2.00 cm LVOT Area:     3.14 cm  LEFT ATRIUM         Index LA diam:    4.90 cm 2.57 cm/m                        PULMONIC VALVE AORTA                 PV Vmax:        0.79 m/s Ao Root diam: 3.90 cm PV Peak grad:   2.5 mmHg                       RVOT Peak grad: 5 mmHg  TRICUSPID VALVE TR Peak grad:   42.5 mmHg TR Vmax:        326.00 cm/s  SHUNTS Systemic Diam: 2.00 cm Julien Nordmannimothy Gollan MD Electronically signed by Julien Nordmannimothy Gollan MD Signature Date/Time: 10/02/2020/2:25:55 PM    Final     Scheduled Meds: . ascorbic acid  500 mg Oral Daily  . atorvastatin  80 mg Oral QPM  . calcium-vitamin D  1 tablet Oral BID  . cholecalciferol  2,000 Units Oral Daily  . diltiazem  120 mg Oral Daily  . DULoxetine  60 mg Oral Daily  . gabapentin  100 mg Oral TID  . magnesium oxide  400 mg Oral Daily  . pantoprazole  40 mg Oral Daily  . predniSONE  40 mg Oral Q breakfast  . rOPINIRole  2 mg Oral QHS  . Warfarin - Pharmacist Dosing Inpatient   Does not apply q1600   Continuous Infusions: . lactated ringers Stopped (10/02/20 2246)  . levofloxacin (LEVAQUIN) IV Stopped (10/02/20 0730)     LOS: 1 day   Time spent: 35 minutes   Nalu Troublefield Estill Cotta Naylah Cork, MD [facility]  If 7PM-7AM, please contact night-coverage [email or web address] 10/03/2020, 12:36 PM

## 2020-10-03 NOTE — ED Notes (Signed)
Called lab to add on procalcitonin at this time. Per lab tech, will add on to previously sent lab work.

## 2020-10-04 DIAGNOSIS — J189 Pneumonia, unspecified organism: Secondary | ICD-10-CM | POA: Diagnosis not present

## 2020-10-04 DIAGNOSIS — I5032 Chronic diastolic (congestive) heart failure: Secondary | ICD-10-CM | POA: Diagnosis not present

## 2020-10-04 DIAGNOSIS — I4821 Permanent atrial fibrillation: Secondary | ICD-10-CM | POA: Diagnosis not present

## 2020-10-04 DIAGNOSIS — N179 Acute kidney failure, unspecified: Secondary | ICD-10-CM | POA: Diagnosis not present

## 2020-10-04 LAB — BASIC METABOLIC PANEL
Anion gap: 6 (ref 5–15)
BUN: 45 mg/dL — ABNORMAL HIGH (ref 8–23)
CO2: 24 mmol/L (ref 22–32)
Calcium: 9.7 mg/dL (ref 8.9–10.3)
Chloride: 105 mmol/L (ref 98–111)
Creatinine, Ser: 1.12 mg/dL — ABNORMAL HIGH (ref 0.44–1.00)
GFR, Estimated: 51 mL/min — ABNORMAL LOW (ref >60)
Glucose, Bld: 138 mg/dL — ABNORMAL HIGH (ref 70–99)
Potassium: 4.3 mmol/L (ref 3.5–5.1)
Sodium: 135 mmol/L (ref 135–145)

## 2020-10-04 LAB — CBC WITH DIFFERENTIAL/PLATELET
Abs Immature Granulocytes: 0.21 10*3/uL — ABNORMAL HIGH (ref 0.00–0.07)
Basophils Absolute: 0 10*3/uL (ref 0.0–0.1)
Basophils Relative: 0 %
Eosinophils Absolute: 0 10*3/uL (ref 0.0–0.5)
Eosinophils Relative: 0 %
HCT: 27.8 % — ABNORMAL LOW (ref 36.0–46.0)
Hemoglobin: 9.1 g/dL — ABNORMAL LOW (ref 12.0–15.0)
Immature Granulocytes: 2 %
Lymphocytes Relative: 3 %
Lymphs Abs: 0.4 10*3/uL — ABNORMAL LOW (ref 0.7–4.0)
MCH: 31.1 pg (ref 26.0–34.0)
MCHC: 32.7 g/dL (ref 30.0–36.0)
MCV: 94.9 fL (ref 80.0–100.0)
Monocytes Absolute: 1.1 10*3/uL — ABNORMAL HIGH (ref 0.1–1.0)
Monocytes Relative: 9 %
Neutro Abs: 10.4 10*3/uL — ABNORMAL HIGH (ref 1.7–7.7)
Neutrophils Relative %: 86 %
Platelets: 232 10*3/uL (ref 150–400)
RBC: 2.93 MIL/uL — ABNORMAL LOW (ref 3.87–5.11)
RDW: 12.9 % (ref 11.5–15.5)
WBC: 12.1 10*3/uL — ABNORMAL HIGH (ref 4.0–10.5)
nRBC: 0.2 % (ref 0.0–0.2)

## 2020-10-04 LAB — PROTIME-INR
INR: 3.1 — ABNORMAL HIGH (ref 0.8–1.2)
Prothrombin Time: 31.2 seconds — ABNORMAL HIGH (ref 11.4–15.2)

## 2020-10-04 MED ORDER — WARFARIN SODIUM 2 MG PO TABS
2.0000 mg | ORAL_TABLET | Freq: Once | ORAL | Status: AC
  Administered 2020-10-04: 2 mg via ORAL
  Filled 2020-10-04: qty 1

## 2020-10-04 NOTE — Plan of Care (Signed)
°  Problem: Respiratory: °Goal: Ability to maintain adequate ventilation will improve °Outcome: Progressing °  °

## 2020-10-04 NOTE — Consult Note (Signed)
ANTICOAGULATION CONSULT NOTE   Pharmacy Consult for Warfarin Indication: atrial fibrillation  Allergies  Allergen Reactions  . Cephalexin Rash  . Latex Rash  . Morphine Nausea And Vomiting  . Tizanidine Palpitations  . Liothyronine Other (See Comments)  . Other Other (See Comments)    Several different statins.  . Statins Other (See Comments)    Several different statins.  . Tape Other (See Comments)    Breaks her skin Allergic to plastic/latex tape. Only use paper tape on patient. Breaks her skin: Allergic to plastic/latex tape. Only use paper tape on patient.  . Tapentadol Other (See Comments)  . Sulfa Antibiotics Rash and Other (See Comments)    Patient Measurements: Height: 5\' 9"  (175.3 cm) Weight: 76.8 kg (169 lb 5 oz) IBW/kg (Calculated) : 66.2  Vital Signs: Temp: 98.1 F (36.7 C) (01/09 0730) Temp Source: Oral (01/09 0730) BP: 135/66 (01/09 0730) Pulse Rate: 67 (01/09 0730)  Labs: Recent Labs    10/01/20 1355 10/01/20 1601 10/02/20 0407 10/03/20 0436 10/04/20 0504  HGB 10.9*  --  10.4* 9.9* 9.1*  HCT 34.8*  --  31.8* 31.2* 27.8*  PLT 257  --  227 247 232  LABPROT 30.8*  --  33.7* 37.8* 31.2*  INR 3.1*  --  3.5* 4.0* 3.1*  CREATININE 1.94*  --  1.41* 1.13* 1.12*  TROPONINIHS 80* 81*  --   --   --     Estimated Creatinine Clearance: 44.7 mL/min (A) (by C-G formula based on SCr of 1.12 mg/dL (H)).   Medical History: Past Medical History:  Diagnosis Date  . Atrial fibrillation, permanent (HCC)    a. CHA2DS2VASc = 4-->coumadin;  b. 09/2014 Echo: EF 55-60%, normal wall motion, mild AI/AS, nl MV, mod dil LA, mildly dil RA, mild to mod TR, PASP 10/2014.  . CHF (congestive heart failure) (HCC)   . Chronic kidney disease, stage I   . CVA (cerebral infarction)    a. 04/2015 - anticoagulation switched from eliquis to xarelto to coumadin.  05/2015 Hypertension   . Leukocytosis   . Pacemaker -Medtronic    a. implant for SSS  . PAD (peripheral artery disease) (HCC)     a. w/ left lower ext claudication s/p ABI's 04/2015 showing nl ABI on Right with abnl waveforms on left sugg of L SFA dzs.  05/2015 Restless leg syndrome   . 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    Medications:  Warfarin home regimen: 3 mg Tues,Sat; 2.5 mg M-W-Th-F,Sun (total weekly dose = 18.5 mg) -- [Current home regimen obtained from most recent coag clinic visit and recent hospital discharge.]  IP Meds: levofloxacin x5 days, Vancomycin, prednisone  Assessment: 75 y.o. female with history of atrial fibrillation with mitral valve repair on warfarin (INR 2-3) and GIB. Presented to ED with SOB with a associated cough and right lower chest pain. INR 3.1 (slightly supratherapeutic) on admission. Pharmacy consulted to dose warfarin while while inpatient.  Patient follows with anticoagulation clinic outpatient (see medications above).   Pt on new 5 day course of Levaquin for CAP treatment and stress dose steroids. Last dose 1/11.   Hgb:10.9>10.4 Plt: 257>227  DATE INR WAFARIN DOSE 1/6 3.1 Held 1/7 3.5 Held 1/8 4.0 Plan to hold 1/9 3.1 2.5 mg   Goal of Therapy:  INR 2-3 Monitor platelets by anticoagulation protocol: Yes   Plan:  INR remains slightly supratherapeutic but trending down.  With  omission of 1/07 dose, will give warfarin 2 mg x 1 tonight (lower dose from home regimen due to DDI). Predict INR to trend down tomorrow. Hgb/plt are stable. Daily INR ordered. CBC at least every 3 days.    Ronnald Ramp, PharmD, BCPS Clinical Pharmacist  10/04/2020,10:33 AM

## 2020-10-04 NOTE — Progress Notes (Signed)
cpap declined by pt 

## 2020-10-04 NOTE — Progress Notes (Signed)
PROGRESS NOTE    Rhonda Griffith  WUJ:[id number] DOB: 07/23/1946 DOA: 10/01/2020 PCP: [name], Todd Hephzibah, MD   Brief Narrative:  Patient is a 75 year old female with past medical history of A. 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  ED course: Afebrile, tachypneic, heart rate 81, blood pressure 108/78, oxygen saturation 93% on room air.  Received vancomycin and aztreonam and loading dose of Solu-Medrol, bronchodilators and BiPAP as needed.  She was also given lorazepam 0.5 mg IV once.  CBC shows leukocytosis of 19.2, hemoglobin: 10.9, INR: 3.1, troponin 80-81, BNP: 258, sodium: 133, lactic acid: WNL, LFTs: WNL.  Chest radiograph shows right lower lobe lobe airspace disease.  Patient admitted for further evaluation and management.  Assessment & Plan:   Acute hypoxemic respiratory failure in the setting of community-acquired pneumonia: -Patient presented with worsening shortness of breath, productive cough and requiring supplemental oxygen and BiPAP as needed. -Afebrile with leukocytosis, Lactic acid: WNL, COVID-19 negative. -Continue levofloxacin, IV steroids switched to p.o.  Discontinue vancomycin since MRSA PCR is negative. -Leukocytosis improving. -Urine Legionella antigen and urine strep pneumo antigen: Pending. PCT: 27.17-trended down to 8.25.  Blood culture is negative till date. -Continue bronchodilators as needed.  Continue to monitor vitals closely. -Currently requiring 8 L of oxygen-we will try to wean off of oxygen as tolerated.  AKI: -Renal function improving.  Monitor INO's. -Renal ultrasound shows bilateral renal cortical atrophy.  No hydronephrosis. -Repeat BMP tomorrow  a.m.  Chronic diastolic CHF: Patient appears euvolemic on exam.  Chest x-ray shows no fluid overload.  BNP: 258 -Echo shows ejection fraction of 55 to 60%, diastolic parameters are indeterminate.  Severe TR. -Strict INO's and daily weight and monitor signs of fluid overload  Hypertension: Blood pressure is stable.  Continue diltiazem 120 mg p.o. daily.  Elevated troponin: Likely in the setting of demand ischemia in the setting of underlying infection.  Patient denies ACS symptoms.  Continue to monitor.  Echo as above.  Permanent A. Fib/history of rheumatic aortic and mitral valve stenosis status post mechanical heart valve: -Continue Cardizem and Coumadin as per pharmacy  Supratherapeutic INR: INR improved from 4.0-3.1.  No signs of active bleeding. -Coumadin as per pharmacy  Hyperlipidemia: Continue statin  GERD: Continue PPI  OSA: Continue BiPAP as needed  Normocytic anemia: Continue to monitor H&H closely.  Reviewed iron panel.  Restless leg syndrome: Continue Requip  Debility: PT/OT recommended SNF-TOC consulted  DVT prophylaxis: Coumadin per pharmacy  code Status: Full code Family Communication: None present at bedside.  Plan of care discussed with patient in length and she verbalized understanding and agreed with it. Disposition Plan: To be determined  Consultants:   None  Procedures:   Echo  Antimicrobials:   Levaquin  Vancomycin  Status is: Inpatient   Dispo: The patient is from: Home              Anticipated d/c is to: SNF              Anticipated d/c date is: 2 days              Patient currently is not medically stable to d/c.    Subjective: Patient seen and examined.  Sitting comfortably on the bed.  On oxygen, communicating  well, explained that her shortness of breath has improved.  Denies chest pain, fever, chills, leg swelling, orthopnea, PND, lightheadedness or dizziness.  Objective: Vitals:   10/03/20 2305 10/04/20 0519 10/04/20 0730  10/04/20 1120  BP: (!) 149/54 138/72 135/66 (!) 140/57  Pulse: (!) 58 67 67 66  Resp: 18 16 16 18   Temp: 97.7 F (36.5 C) 98.4 F (36.9 C) 98.1 F (36.7 C) 97.7 F (36.5 C)  TempSrc: Oral Oral Oral   SpO2: 100% 100% 100% 100%  Weight:  76.8 kg    Height:        Intake/Output Summary (Last 24 hours) at 10/04/2020 1259 Last data filed at 10/04/2020 1121 Gross per 24 hour  Intake 689.93 ml  Output 400 ml  Net 289.93 ml   Filed Weights   10/01/20 1339 10/04/20 0519  Weight: 74.8 kg 76.8 kg    Examination:  General exam: Appears calm and comfortable on nasal cannula,, thin and lean, elderly looking, communicating well Respiratory system: Mild expiratory wheezing noted on the bases.   Cardiovascular system: Regular rate rhythm, no pedal swelling. Gastrointestinal system: Abdomen is nondistended, soft and nontender. No organomegaly or masses felt. Normal bowel sounds heard. Central nervous system: Alert and oriented. No focal neurological deficits. Extremities: Symmetric 5 x 5 power. Skin: No rashes, lesions or ulcers Psychiatry: Judgement and insight appear normal. Mood & affect appropriate.    Data Reviewed: I have personally reviewed following labs and imaging studies  CBC: Recent Labs  Lab 10/01/20 1355 10/02/20 0407 10/03/20 0436 10/04/20 0504  WBC 19.2* 16.7* 14.1* 12.1*  NEUTROABS  --  15.6* 12.8* 10.4*  HGB 10.9* 10.4* 9.9* 9.1*  HCT 34.8* 31.8* 31.2* 27.8*  MCV 97.5 96.4 96.9 94.9  PLT 257 227 247 232   Basic Metabolic Panel: Recent Labs  Lab 10/01/20 1355 10/02/20 0407 10/03/20 0436 10/04/20 0504  NA 133* 137 137 135  K 4.2 4.0 3.7 4.3  CL 98 104 103 105  CO2 23 23 25 24   GLUCOSE 104* 151* 126* 138*  BUN 45* 44* 42* 45*  CREATININE 1.94* 1.41* 1.13* 1.12*  CALCIUM 10.1 9.4 10.0 9.7   GFR: Estimated Creatinine Clearance: 44.7 mL/min (A) (by C-G formula based on SCr of 1.12 mg/dL (H)). Liver Function Tests: Recent Labs  Lab 10/01/20 1601  AST  33  ALT 14  ALKPHOS 83  BILITOT 1.2  PROT 7.2  ALBUMIN 3.5   No results for input(s): LIPASE, AMYLASE in the last 168 hours. No results for input(s): AMMONIA in the last 168 hours. Coagulation Profile: Recent Labs  Lab 10/01/20 1355 10/02/20 0407 10/03/20 0436 10/04/20 0504  INR 3.1* 3.5* 4.0* 3.1*   Cardiac Enzymes: No results for input(s): CKTOTAL, CKMB, CKMBINDEX, TROPONINI in the last 168 hours. BNP (last 3 results) No results for input(s): PROBNP in the last 8760 hours. HbA1C: No results for input(s): HGBA1C in the last 72 hours. CBG: No results for input(s): GLUCAP in the last 168 hours. Lipid Profile: No results for input(s): CHOL, HDL, LDLCALC, TRIG, CHOLHDL, LDLDIRECT in the last 72 hours. Thyroid Function Tests: No results for input(s): TSH, T4TOTAL, FREET4, T3FREE, THYROIDAB in the last 72 hours. Anemia Panel: Recent Labs    10/02/20 0407  VITAMINB12 282  FOLATE 9.0  FERRITIN 137  TIBC 217*  IRON 13*  RETICCTPCT 0.8   Sepsis Labs: Recent Labs  Lab 10/01/20 1355 10/01/20 1810 10/01/20 1900 10/03/20 0436  PROCALCITON 27.17  --   --  8.25  LATICACIDVEN  --  1.6 1.5  --     Recent Results (from the past 240 hour(s))  Culture, blood (routine x 2)     Status: None (Preliminary result)   Collection Time: 10/01/20  7:00 PM   Specimen: BLOOD  Result Value Ref Range Status   Specimen Description BLOOD BLOOD RIGHT FOREARM  Final   Special Requests   Final    BOTTLES DRAWN AEROBIC AND ANAEROBIC Blood Culture results may not be optimal due to an inadequate volume of blood received in culture bottles   Culture   Final    NO GROWTH 3 DAYS Performed at [facility], [address], [place], [place] [address]    Report Status PENDING  Incomplete  Culture, blood (routine x 2)     Status: None (Preliminary result)   Collection Time: 10/01/20  7:00 PM   Specimen: BLOOD  Result Value Ref Range Status   Specimen Description BLOOD BLOOD LEFT FOREARM   Final   Special Requests   Final    BOTTLES DRAWN AEROBIC AND ANAEROBIC Blood Culture adequate volume   Culture   Final    NO GROWTH 3 DAYS Performed at [facility], [address], [place], [place] [address]    Report Status PENDING  Incomplete  Resp panel by RT-PCR (RSV, Flu A&B, Covid) Nasopharyngeal Swab     Status: None   Collection Time: 10/01/20  7:00 PM   Specimen: Nasopharyngeal Swab; Nasopharyngeal(NP) swabs in vial transport medium  Result Value Ref Range Status   SARS Coronavirus 2 by RT PCR NEGATIVE NEGATIVE Final    Comment: (NOTE) SARS-CoV-2 target nucleic acids are NOT DETECTED.  The SARS-CoV-2 RNA is generally detectable in upper respiratory specimens during the acute phase of infection. The lowest concentration of SARS-CoV-2 viral copies this assay can detect is 138 copies/mL. A negative result does not preclude SARS-Cov-2 infection and should not be used as the sole basis for treatment or other patient management decisions. A negative result may occur with  improper specimen collection/handling, submission of specimen other than nasopharyngeal swab, presence of viral mutation(s) within the areas targeted by this assay, and inadequate number of viral copies(<138 copies/mL). A negative result must be combined with clinical observations, patient history, and epidemiological information. The expected result is Negative.  Fact Sheet for Patients:  BloggerCourse.com  Fact Sheet for Healthcare Providers:  SeriousBroker.it  This test is no t yet approved or cleared by the [place] FDA and  has been authorized for detection and/or diagnosis of SARS-CoV-2 by FDA under an Emergency Use Authorization (EUA). This EUA will remain  in effect (meaning this test can be used) for the duration of the COVID-19 declaration under Section 564(b)(1) of the Act, 21 U.S.C.section 360bbb-3(b)(1), unless the authorization  is terminated  or revoked sooner.       Influenza A by PCR NEGATIVE NEGATIVE Final   Influenza B by PCR NEGATIVE NEGATIVE Final    Comment: (NOTE) The Xpert Xpress SARS-CoV-2/FLU/RSV plus assay is intended as an aid in the diagnosis of influenza from Nasopharyngeal swab specimens and should not be used as a sole basis for treatment. Nasal washings and aspirates are unacceptable for Xpert Xpress SARS-CoV-2/FLU/RSV testing.  Fact Sheet for Patients: BloggerCourse.com  Fact Sheet for Healthcare Providers: SeriousBroker.it  This test is not yet approved or cleared by the [place] FDA and has been authorized for detection and/or diagnosis of SARS-CoV-2 by FDA under an Emergency Use Authorization (EUA). This EUA  will remain in effect (meaning this test can be used) for the duration of the COVID-19 declaration under Section 564(b)(1) of the Act, 21 U.S.C. section 360bbb-3(b)(1), unless the authorization is terminated or revoked.     Resp Syncytial Virus by PCR NEGATIVE NEGATIVE Final    Comment: (NOTE) Fact Sheet for Patients: BloggerCourse.com  Fact Sheet for Healthcare Providers: SeriousBroker.it  This test is not yet approved or cleared by the [place] FDA and has been authorized for detection and/or diagnosis of SARS-CoV-2 by FDA under an Emergency Use Authorization (EUA). This EUA will remain in effect (meaning this test can be used) for the duration of the COVID-19 declaration under Section 564(b)(1) of the Act, 21 U.S.C. section 360bbb-3(b)(1), unless the authorization is terminated or revoked.  Performed at [facility], [address] [place], [place] [address]   MRSA PCR Screening     Status: None   Collection Time: 10/02/20 10:56 AM   Specimen: Nasopharyngeal  Result Value Ref Range Status   MRSA by PCR NEGATIVE NEGATIVE Final    Comment:         The GeneXpert MRSA Assay (FDA approved for NASAL specimens only), is one component of a comprehensive MRSA colonization surveillance program. It is not intended to diagnose MRSA infection nor to guide or monitor treatment for MRSA infections. Performed at [facility], [address] [place], [place] [address]       Radiology Studies: No results found.  Scheduled Meds: . ascorbic acid  500 mg Oral Daily  . atorvastatin  80 mg Oral QPM  . calcium-vitamin D  1 tablet Oral BID  . cholecalciferol  2,000 Units Oral Daily  . diltiazem  120 mg Oral Daily  . DULoxetine  60 mg Oral Daily  . gabapentin  100 mg Oral TID  . magnesium oxide  400 mg Oral Daily  . pantoprazole  40 mg Oral Daily  . predniSONE  40 mg Oral Q breakfast  . rOPINIRole  2 mg Oral QHS  . warfarin  2 mg Oral ONCE-1600  . Warfarin - Pharmacist Dosing Inpatient   Does not apply q1600   Continuous Infusions: . lactated ringers 75 mL/hr at 10/03/20 1611  . levofloxacin (LEVAQUIN) IV 750 mg (10/04/20 0641)     LOS: 2 days   Time spent: 35 minutes   Jannetta Massey Estill Cotta, MD [facility]  If 7PM-7AM, please contact night-coverage [email or web address] 10/04/2020, 12:59 PM

## 2020-10-05 DIAGNOSIS — J189 Pneumonia, unspecified organism: Secondary | ICD-10-CM | POA: Diagnosis not present

## 2020-10-05 DIAGNOSIS — I5032 Chronic diastolic (congestive) heart failure: Secondary | ICD-10-CM | POA: Diagnosis not present

## 2020-10-05 DIAGNOSIS — I4821 Permanent atrial fibrillation: Secondary | ICD-10-CM | POA: Diagnosis not present

## 2020-10-05 DIAGNOSIS — N179 Acute kidney failure, unspecified: Secondary | ICD-10-CM | POA: Diagnosis not present

## 2020-10-05 LAB — CBC WITH DIFFERENTIAL/PLATELET
Abs Immature Granulocytes: 0.5 10*3/uL — ABNORMAL HIGH (ref 0.00–0.07)
Basophils Absolute: 0 10*3/uL (ref 0.0–0.1)
Basophils Relative: 0 %
Eosinophils Absolute: 0 10*3/uL (ref 0.0–0.5)
Eosinophils Relative: 0 %
HCT: 29.3 % — ABNORMAL LOW (ref 36.0–46.0)
Hemoglobin: 9.6 g/dL — ABNORMAL LOW (ref 12.0–15.0)
Immature Granulocytes: 3 %
Lymphocytes Relative: 4 %
Lymphs Abs: 0.6 10*3/uL — ABNORMAL LOW (ref 0.7–4.0)
MCH: 31.1 pg (ref 26.0–34.0)
MCHC: 32.8 g/dL (ref 30.0–36.0)
MCV: 94.8 fL (ref 80.0–100.0)
Monocytes Absolute: 1.4 10*3/uL — ABNORMAL HIGH (ref 0.1–1.0)
Monocytes Relative: 9 %
Neutro Abs: 12.2 10*3/uL — ABNORMAL HIGH (ref 1.7–7.7)
Neutrophils Relative %: 84 %
Platelets: 260 10*3/uL (ref 150–400)
RBC: 3.09 MIL/uL — ABNORMAL LOW (ref 3.87–5.11)
RDW: 12.9 % (ref 11.5–15.5)
WBC: 14.7 10*3/uL — ABNORMAL HIGH (ref 4.0–10.5)
nRBC: 0 % (ref 0.0–0.2)

## 2020-10-05 LAB — BASIC METABOLIC PANEL
Anion gap: 8 (ref 5–15)
BUN: 39 mg/dL — ABNORMAL HIGH (ref 8–23)
CO2: 24 mmol/L (ref 22–32)
Calcium: 9.8 mg/dL (ref 8.9–10.3)
Chloride: 104 mmol/L (ref 98–111)
Creatinine, Ser: 1.05 mg/dL — ABNORMAL HIGH (ref 0.44–1.00)
GFR, Estimated: 55 mL/min — ABNORMAL LOW (ref >60)
Glucose, Bld: 122 mg/dL — ABNORMAL HIGH (ref 70–99)
Potassium: 4.5 mmol/L (ref 3.5–5.1)
Sodium: 136 mmol/L (ref 135–145)

## 2020-10-05 LAB — RESP PANEL BY RT-PCR (FLU A&B, COVID) ARPGX2
Influenza A by PCR: NEGATIVE
Influenza B by PCR: NEGATIVE
SARS Coronavirus 2 by RT PCR: NEGATIVE

## 2020-10-05 LAB — PROTIME-INR
INR: 2.8 — ABNORMAL HIGH (ref 0.8–1.2)
Prothrombin Time: 28.4 seconds — ABNORMAL HIGH (ref 11.4–15.2)

## 2020-10-05 LAB — PROCALCITONIN: Procalcitonin: 1.56 ng/mL

## 2020-10-05 LAB — MAGNESIUM: Magnesium: 2.1 mg/dL (ref 1.7–2.4)

## 2020-10-05 MED ORDER — DULOXETINE HCL 60 MG PO CPEP: 60.0000 mg | ORAL_CAPSULE | Freq: Every day | ORAL | 0 refills | Status: AC

## 2020-10-05 MED ORDER — WARFARIN SODIUM 1 MG PO TABS: 1.0000 mg | ORAL_TABLET | Freq: Every day | ORAL | 0 refills | Status: DC

## 2020-10-05 MED ORDER — WARFARIN SODIUM 2.5 MG PO TABS: 2.5000 mg | ORAL_TABLET | Freq: Every day | ORAL | 0 refills | Status: AC

## 2020-10-05 MED ORDER — LEVOFLOXACIN 750 MG PO TABS: 750.0000 mg | ORAL_TABLET | Freq: Every day | ORAL | 0 refills | Status: AC

## 2020-10-05 MED ORDER — WARFARIN SODIUM 2 MG PO TABS
2.0000 mg | ORAL_TABLET | Freq: Once | ORAL | Status: DC
  Filled 2020-10-05: qty 1

## 2020-10-05 MED ORDER — PREDNISONE 20 MG PO TABS: 40.0000 mg | ORAL_TABLET | Freq: Every day | ORAL | 0 refills | Status: AC

## 2020-10-05 NOTE — Consult Note (Addendum)
ANTICOAGULATION CONSULT NOTE   Pharmacy Consult for Warfarin Indication: atrial fibrillation  Allergies  Allergen Reactions  . Cephalexin Rash  . Latex Rash  . Morphine Nausea And Vomiting  . Tizanidine Palpitations  . Liothyronine Other (See Comments)  . Other Other (See Comments)    Several different statins.  . Statins Other (See Comments)    Several different statins.  . Tape Other (See Comments)    Breaks her skin Allergic to plastic/latex tape. Only use paper tape on patient. Breaks her skin: Allergic to plastic/latex tape. Only use paper tape on patient.  . Tapentadol Other (See Comments)  . Sulfa Antibiotics Rash and Other (See Comments)    Patient Measurements: Height: 5\' 9"  (175.3 cm) Weight: 77.1 kg (169 lb 15.6 oz) IBW/kg (Calculated) : 66.2  Vital Signs: Temp: 97.4 F (36.3 C) (01/10 1230) Temp Source: Oral (01/10 1230) BP: 145/62 (01/10 1230) Pulse Rate: 64 (01/10 1230)  Labs: Recent Labs    10/03/20 0436 10/04/20 0504 10/05/20 0409  HGB 9.9* 9.1* 9.6*  HCT 31.2* 27.8* 29.3*  PLT 247 232 260  LABPROT 37.8* 31.2* 28.4*  INR 4.0* 3.1* 2.8*  CREATININE 1.13* 1.12* 1.05*    Estimated Creatinine Clearance: 47.6 mL/min (A) (by C-G formula based on SCr of 1.05 mg/dL (H)).   Medical History: Past Medical History:  Diagnosis Date  . Atrial fibrillation, permanent (HCC)    a. CHA2DS2VASc = 4-->coumadin;  b. 09/2014 Echo: EF 55-60%, normal wall motion, mild AI/AS, nl MV, mod dil LA, mildly dil RA, mild to mod TR, PASP 10/2014.  . CHF (congestive heart failure) (HCC)   . Chronic kidney disease, stage I   . CVA (cerebral infarction)    a. 04/2015 - anticoagulation switched from eliquis to xarelto to coumadin.  05/2015 Hypertension   . Leukocytosis   . Pacemaker -Medtronic    a. implant for SSS  . PAD (peripheral artery disease) (HCC)    a. w/ left lower ext claudication s/p ABI's 04/2015 showing nl ABI on Right with abnl waveforms on left sugg of L SFA dzs.   05/2015 Restless leg syndrome   . 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    Medications:  Warfarin home regimen: 3 mg Tues,Sat; 2.5 mg M-W-Th-F,Sun (total weekly dose = 18.5 mg) -- [Current home regimen obtained from most recent coag clinic visit and recent hospital discharge.]  IP Meds: levofloxacin x5 days (ends 1/13), Vancomycin, prednisone  Assessment: 75 y.o. female with history of atrial fibrillation with mitral valve repair on warfarin (INR 2-3) and GIB. Presented to ED with SOB with a associated cough and right lower chest pain. INR 3.1 (slightly supratherapeutic) on admission. Pharmacy consulted to dose warfarin while while inpatient.  Patient follows with anticoagulation clinic outpatient (see medications above).   Pt on new 5 day course of Levaquin for CAP treatment and stress dose steroids. Last dose 1/11.   Hgb:10.9>10.4 Plt: 257>227  DATE INR WAFARIN DOSE 1/6 3.1 Held 1/7 3.5 Held 1/8 4.0 Held 1/9 3.1 2 mg 1/10 2.8 2 mg    Goal of Therapy:  INR 2-3 Monitor platelets by anticoagulation protocol: Yes   Plan:  INR therapeutic. Will give warfarin 2 mg x 1 tonight (lower dose from home regimen due to DDI). Predict INR to trend down tomorrow. Hgb/plt are stable. Daily INR ordered. CBC at least every 3 days.  Discharge recommendation: Plan to discharge on 1 mg  daily x 2 days until discontinuation of levofloxacin then 2.5mg  daily thereafter until she can be reassessed at clinic. She was previously therapeutic at home dose of 3 mg Tues,Sat; 2.5 mg M-W-Th-F,Sun - anticipate that will still be an appropriated dose once she gets to her normal state of health.  Reatha Armour, PharmD Pharmacy Resident  10/05/2020 1:00 PM

## 2020-10-05 NOTE — TOC Progression Note (Signed)
Transition of Care [facility]) - Progression Note    Patient Details  Name: Rhonda Griffith MRN: [id number] Date of Birth: [birth year]  Transition of Care [facility]) CM/SW Contact  Victorville Cellar, RN Phone Number: 10/05/2020, 4:44 PM  Clinical Narrative:    [facility] started via Jani Files id [id number]   Expected Discharge Plan: Skilled Nursing Facility Barriers to Discharge: Barriers Resolved  Expected Discharge Plan and Services Expected Discharge Plan: Skilled Nursing Facility       Living arrangements for the past 2 months: Single Family Home Expected Discharge Date: 10/05/20               DME Arranged: N/A DME Agency: NA       HH Arranged: NA HH Agency: NA         Social Determinants of Health (SDOH) Interventions    Readmission Risk Interventions Readmission Risk Prevention Plan 04/14/2019  Transportation Screening Complete  PCP or Specialist Appt within 3-5 Days Complete  HRI or Home Care Consult Not Complete  HRI or Home Care Consult comments Pending PT recs  Social Work Consult for Recovery Care Planning/Counseling Complete  Palliative Care Screening Not Applicable  Medication Review Oceanographer) Not Complete  Med Review Comments To be done at time of discharge  Some recent data might be hidden

## 2020-10-05 NOTE — TOC Transition Note (Signed)
Transition of Care [facility]) - CM/SW Discharge Note   Patient Details  Name: Rhonda Griffith MRN: [id number] Date of Birth: 08/30/1946  Transition of Care [facility]) CM/SW Contact:  Hetty Ely, RN Phone Number: 10/05/2020, 1:47 PM    Final next level of care: Skilled Nursing Facility Barriers to Discharge: Barriers Resolved   Patient Goals and CMS Choice Patient states their goals for this hospitalization and ongoing recovery are:: Go to SNF.   Choice offered to / list presented to : NA (Patient requested [facility])  Discharge Placement                Patient to be transferred to facility by: [facility] Name of family member notified: Daughter Lupita Leash Patient and family notified of of transfer: 10/05/20  Discharge Plan and Services                DME Arranged: N/A DME Agency: NA       HH Arranged: NA HH Agency: NA        Social Determinants of Health (SDOH) Interventions     Readmission Risk Interventions Readmission Risk Prevention Plan 04/14/2019  Transportation Screening Complete  PCP or Specialist Appt within 3-5 Days Complete  HRI or Home Care Consult Not Complete  HRI or Home Care Consult comments Pending PT recs  Social Work Consult for Recovery Care Planning/Counseling Complete  Palliative Care Screening Not Applicable  Medication Review Oceanographer) Not Complete  Med Review Comments To be done at time of discharge  Some recent data might be hidden

## 2020-10-05 NOTE — Discharge Summary (Signed)
Physician Discharge Summary  Merelyn Klump ZOX:[id number] DOB: [birth year] DOA: 10/01/2020  PCP: [name], MD  Admit date: 10/01/2020 Discharge date: 10/05/2020  Admitted From: Home Disposition:  SNF  Recommendations for Outpatient Follow-up:  Follow-up with PCP in 1 week Repeat CBC and BMP on follow-up. Take Levaquin 750 mg p.o. once tomorrow Continue prednisone 40 mg daily for 3 more days Take Coumadin 1 mg daily for 2 days and then 2.5 mg daily afterwards Recheck PT/INR tomorrow  Home Health: None Equipment/Devices: None Discharge Condition: Stable CODE STATUS: Full code Diet recommendation: Heart healthy  Brief/Interim Summary: Patient is a 75 year old female with past medical history of A. 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  ED course: Afebrile, tachypneic, heart rate 81, blood pressure 108/78, oxygen saturation 93% on room air.  Received vancomycin and aztreonam and loading dose of Solu-Medrol, bronchodilators and BiPAP as needed.  She was also given lorazepam 0.5 mg IV once.  CBC shows leukocytosis of 19.2, hemoglobin: 10.9, INR: 3.1, troponin 80-81, BNP: 258, sodium: 133, lactic acid: WNL, LFTs: WNL.  Chest radiograph shows right lower lobe lobe airspace disease.  Patient admitted for further evaluation and management.  Acute hypoxemic respiratory failure in the setting of community-acquired pneumonia: -Patient presented with worsening shortness of breath, productive cough and requiring supplemental oxygen and BiPAP as needed. -Afebrile with leukocytosis, Lactic acid: WNL, COVID-19 negative. -Started on levofloxacin 750 mg IV every 48 hours based on renal function,  vancomycin, IV Solu-Medrol -Patient remained afebrile.  Vancomycin was discontinued as MRSA PCR resulted negative.  IV steroids switched to p.o. prednisone.  -Urine Legionella antigen and urine strep pneumo antigen: Pending. PCT: 27.17-trended down to 8.25--> 1.56.  Blood culture is negative till date. -Patient weaned off of oxygen.  She is off of oxygen since yesterday afternoon.  Maintaining oxygen saturation of 95 to 96% on room air.  AKI: -Renal function improved -Renal ultrasound shows bilateral renal cortical atrophy.  No hydronephrosis.  Chronic diastolic CHF: Patient appears euvolemic on exam.  Chest x-ray shows no fluid overload.  BNP: 258 -Echo shows ejection fraction of 55 to 60%, diastolic parameters are indeterminate.  Severe TR. -Strict INO's and daily weight and monitored signs of fluid overload  Hypertension:  Remained stable on diltiazem 120 mg p.o. daily.  Elevated troponin: Likely in the setting of demand ischemia in the setting of underlying infection.  Patient denies ACS symptoms.  Continue to monitor.  Echo as above.  Permanent A. Fib/history of rheumatic aortic and mitral valve stenosis status post mechanical heart valve: -Continued Cardizem and Coumadin as per pharmacy -Home dose of Coumadin-3 mg on Fridays and Saturdays and 2.5 mg rest of other days. -Discharge recommendations as per pharmacy: Coumadin 1 mg p.o. daily for 2 days and then 2.5 mg daily afterwards.  Supratherapeutic INR: INR improved from 4.0-3.1-2.8.  No signs of active bleeding. -Coumadin dose at discharge as above.  Hyperlipidemia: Continued statin  GERD: Continued PPI  OSA: Continued BiPAP as needed  Normocytic anemia: H&H remained stable.  Reviewed iron panel.  Restless leg syndrome: Continued Requip  Debility: PT/OT recommended SNF-TOC consulted  Disposition: Patient will be discharged to SNF in a stable condition.  Her breathing has improved and on RA since yesterday.  Comfortable going to nursing home today.  Discharge Diagnoses:  Acute hypoxemic respiratory failure in the setting of community-acquired pneumonia AKI Chronic diastolic CHF Hypertension Elevated troponin Permanent A. fib/history of rheumatic aortic and mitral valve stenosis status post mechanical heart valve Supratherapeutic INR Hyperlipidemia GERD OSA Normocytic anemia Restless leg syndrome Debility  Discharge Instructions  Discharge Instructions    Discharge instructions   Complete by: As directed    Follow-up with PCP in 1 week Repeat CBC and BMP on follow-up. Take Levaquin 750 mg p.o. once tomorrow Continue prednisone 40 mg daily for 3 more days Take Coumadin 1 mg daily for 2 days and then 2.5 mg daily afterwards   Increase activity slowly   Complete by: As directed      Allergies as of 10/05/2020      Reactions   Cephalexin Rash   Latex Rash   Morphine Nausea And Vomiting   Tizanidine Palpitations   Liothyronine Other (See Comments)   Other Other (See Comments)   Several different statins.   Statins Other (See Comments)   Several different statins.   Tape Other (See Comments)   Breaks her skin Allergic to plastic/latex tape. Only use paper tape on patient. Breaks her skin: Allergic to plastic/latex tape. Only use paper tape on patient.   Tapentadol Other (See Comments)   Sulfa Antibiotics Rash, Other (See Comments)      Medication List    STOP taking these medications   amoxicillin-clavulanate 500-125 MG tablet Commonly known as: AUGMENTIN     TAKE these medications   acetaminophen 650 MG CR tablet Commonly known as: TYLENOL Take 650 mg by mouth every 8 (eight) hours as needed for pain.   albuterol 1.25 MG/3ML nebulizer solution Commonly known as: ACCUNEB Take by nebulization.   alendronate 70 MG tablet Commonly known as: FOSAMAX Take 70 mg by mouth once a week.   ascorbic acid 500 MG tablet Commonly known as: VITAMIN C Take 500 mg by mouth  daily.   atorvastatin 80 MG tablet Commonly known as: LIPITOR Take 80 mg by mouth every evening.   benzonatate 100 MG capsule Commonly known as: TESSALON Take 100 mg by mouth 3 (three) times daily.   clobetasol cream 0.05 % Commonly known as: TEMOVATE Apply 1 application topically 2 (two) times daily.   cyclobenzaprine 5 MG tablet Commonly known as: FLEXERIL Take 5 mg by mouth 2 (two) times daily as needed for muscle spasms.   diltiazem 120 MG 24 hr capsule Commonly known as: CARDIZEM CD Take 1 capsule (120 mg total) by mouth daily.   docusate sodium 100 MG capsule Commonly known as: COLACE Take 100 mg by mouth daily as needed for mild constipation.   DULoxetine 60 MG capsule Commonly known as: CYMBALTA Take 1 capsule (60 mg total) by mouth daily.   FLUoxetine 40 MG capsule Commonly known as: PROZAC Take 40 mg by mouth at bedtime.   fluticasone 50 MCG/ACT nasal spray Commonly known as: FLONASE 2 sprays by Each Nare route daily as needed for allergies.   gabapentin 100 MG capsule Commonly known as: NEURONTIN Take 100 mg by mouth 2 (two) times daily.   guaiFENesin 600 MG 12 hr tablet Commonly known as: MUCINEX Take 600 mg by mouth 2 (two) times daily as needed for cough or to loosen phlegm.   levofloxacin 750 MG tablet Commonly known as: Levaquin Take 1 tablet (750 mg total) by mouth daily for 1 day. Start taking on: October 06, 2020   magnesium  oxide 400 MG tablet Commonly known as: MAG-OX Take 400 mg by mouth daily.   Oyster Shell Calcium 500-400 MG-UNIT Tabs Take 1 tablet by mouth 2 (two) times daily.   pantoprazole 40 MG tablet Commonly known as: PROTONIX Take 40 mg by mouth daily.   predniSONE 20 MG tablet Commonly known as: DELTASONE Take 2 tablets (40 mg total) by mouth daily with breakfast for 3 days. Start taking on: October 06, 2020   rOPINIRole 2 MG tablet Commonly known as: REQUIP Take 2 mg by mouth at bedtime.   senna 8.6 MG  tablet Commonly known as: SENOKOT Take 1 tablet by mouth as needed for constipation.   Vitamin D3 25 MCG (1000 UT) Caps Take 2,000 Units by mouth every 30 (thirty) days.   warfarin 1 MG tablet Commonly known as: Coumadin Take 1 tablet (1 mg total) by mouth daily for 2 days. What changed:   medication strength  how much to take  when to take this  additional instructions   warfarin 2.5 MG tablet Commonly known as: COUMADIN Take 1 tablet (2.5 mg total) by mouth daily. Start taking on: October 08, 2020 What changed:   when to take this  additional instructions  These instructions start on October 08, 2020. If you are unsure what to do until then, ask your doctor or other care provider.       Allergies  Allergen Reactions  . Cephalexin Rash  . Latex Rash  . Morphine Nausea And Vomiting  . Tizanidine Palpitations  . Liothyronine Other (See Comments)  . Other Other (See Comments)    Several different statins.  . Statins Other (See Comments)    Several different statins.  . Tape Other (See Comments)    Breaks her skin Allergic to plastic/latex tape. Only use paper tape on patient. Breaks her skin: Allergic to plastic/latex tape. Only use paper tape on patient.  . Tapentadol Other (See Comments)  . Sulfa Antibiotics Rash and Other (See Comments)    Consultations:  None   Procedures/Studies: DG Chest 2 View  Result Date: 10/01/2020 CLINICAL DATA:  Shortness of breath. EXAM: CHEST - 2 VIEW COMPARISON:  04/27/2020. FINDINGS: Cardiac pacer in stable position. Prior median sternotomy and cardiac valve replacement. Cardiomegaly. Low lung volumes. Diffuse bilateral interstitial prominence with prominent alveolar infiltrates/edema right lung. CHF and/or pneumonia could present this fashion. Small right pleural effusion. No pneumothorax. Degenerative changes scoliosis thoracic spine. Surgical clips right upper quadrant. IMPRESSION: 1. Cardiac pacer in stable position.  Prior median sternotomy and cardiac valve replacement. Cardiomegaly. 2. Low lung volumes. Diffuse bilateral interstitial prominence with prominent alveolar infiltrates/edema right lung. CHF and/or pneumonia could present in this fashion. Electronically Signed   By: Maisie Fus  Register   On: 10/01/2020 14:14   DG Pelvis 1-2 Views  Result Date: 09/09/2020 CLINICAL DATA:  Larey Seat today. EXAM: PELVIS - 1-2 VIEW COMPARISON:  04/10/2019 FINDINGS: Interval right hip hemiarthroplasty in satisfactory position. Negative for acute fracture. Irregularity of the left inferior pubic ramus which was not present previously may represent a healing fracture. Limited views of this area. IMPRESSION: Right hip hemiarthroplasty. Irregularity left inferior pubic ramus may represent healing fracture. There is pain in this area, recommend further images of the left hip. Electronically Signed   By: Marlan Palau M.D.   On: 09/09/2020 14:34   CT Head Wo Contrast  Result Date: 09/09/2020 CLINICAL DATA:  Status post fall.  Hit head.  On anticoagulation EXAM: CT HEAD WITHOUT CONTRAST CT CERVICAL SPINE  WITHOUT CONTRAST TECHNIQUE: Multidetector CT imaging of the head and cervical spine was performed following the standard protocol without intravenous contrast. Multiplanar CT image reconstructions of the cervical spine were also generated. COMPARISON:  04/10/2019. FINDINGS: CT HEAD FINDINGS Brain: No evidence of acute infarction, hemorrhage, hydrocephalus, extra-axial collection or mass lesion/mass effect. There is mild diffuse low-attenuation within the subcortical and periventricular white matter compatible with chronic microvascular disease. Chronic infarcts within the right basal ganglia, left thalamus and right cerebellar hemisphere. Prominence of the sulci and ventricles are again noted compatible with brain atrophy. Vascular: No hyperdense vessel or unexpected calcification. Skull: Normal. Negative for fracture or focal lesion.  Sinuses/Orbits: No acute finding. Other: None. CT CERVICAL SPINE FINDINGS Alignment: Normal Skull base and vertebrae: No acute fracture. No primary bone lesion or focal pathologic process. Soft tissues and spinal canal: No prevertebral fluid or swelling. No visible canal hematoma. Disc levels: Disc space narrowing and endplate spurring noted at C5-6 and C6-7. Multi level facet arthropathy is identified. Upper chest: Negative. Other: None IMPRESSION: 1. No acute intracranial abnormalities. 2. Chronic small vessel ischemic disease and brain atrophy. 3. No evidence for cervical spine fracture. 4. Cervical spondylosis. Electronically Signed   By: Signa Kellaylor  Stroud M.D.   On: 09/09/2020 15:55   CT Cervical Spine Wo Contrast  Result Date: 09/09/2020 CLINICAL DATA:  Status post fall.  Hit head.  On anticoagulation EXAM: CT HEAD WITHOUT CONTRAST CT CERVICAL SPINE WITHOUT CONTRAST TECHNIQUE: Multidetector CT imaging of the head and cervical spine was performed following the standard protocol without intravenous contrast. Multiplanar CT image reconstructions of the cervical spine were also generated. COMPARISON:  04/10/2019. FINDINGS: CT HEAD FINDINGS Brain: No evidence of acute infarction, hemorrhage, hydrocephalus, extra-axial collection or mass lesion/mass effect. There is mild diffuse low-attenuation within the subcortical and periventricular white matter compatible with chronic microvascular disease. Chronic infarcts within the right basal ganglia, left thalamus and right cerebellar hemisphere. Prominence of the sulci and ventricles are again noted compatible with brain atrophy. Vascular: No hyperdense vessel or unexpected calcification. Skull: Normal. Negative for fracture or focal lesion. Sinuses/Orbits: No acute finding. Other: None. CT CERVICAL SPINE FINDINGS Alignment: Normal Skull base and vertebrae: No acute fracture. No primary bone lesion or focal pathologic process. Soft tissues and spinal canal: No  prevertebral fluid or swelling. No visible canal hematoma. Disc levels: Disc space narrowing and endplate spurring noted at C5-6 and C6-7. Multi level facet arthropathy is identified. Upper chest: Negative. Other: None IMPRESSION: 1. No acute intracranial abnormalities. 2. Chronic small vessel ischemic disease and brain atrophy. 3. No evidence for cervical spine fracture. 4. Cervical spondylosis. Electronically Signed   By: Signa Kellaylor  Stroud M.D.   On: 09/09/2020 15:55   CT PELVIS WO CONTRAST  Addendum Date: 09/09/2020   ADDENDUM REPORT: 09/09/2020 19:09 ADDENDUM: Probable fat necrosis and scarring within the left hip lateral soft tissues. Electronically Signed   By: Jasmine PangKim  Fujinaga M.D.   On: 09/09/2020 19:09   Result Date: 09/09/2020 CLINICAL DATA:  Pelvic trauma EXAM: CT PELVIS WITHOUT CONTRAST TECHNIQUE: Multidetector CT imaging of the pelvis was performed following the standard protocol without intravenous contrast. COMPARISON:  Radiograph 09/09/2020, 04/13/2019 FINDINGS: Urinary Tract:  No abnormality visualized. Bowel:  Unremarkable visualized pelvic bowel loops. Vascular/Lymphatic: Moderate aortic atherosclerosis. No aneurysm. No suspicious nodes Reproductive:  Uterus and adnexa are unremarkable Other:  Negative for pelvic effusion Musculoskeletal: Right hip replacement with associated artifact. No acute displaced fracture or malalignment is seen. Chronic appearing fracture deformity involving left inferior  pubic ramus. Suspected chronic fracture deformity of the upper sacrum best seen on sagittal views. IMPRESSION: 1. Right hip replacement without acute displaced fracture or malalignment. 2. Chronic appearing fracture deformity involving the left inferior pubic ramus. Suspected chronic fracture deformity of the upper sacrum. MRI evaluation may be obtained depending upon clinical course and or continued suspicion for acute osseous injury. Aortic Atherosclerosis (ICD10-I70.0). Electronically Signed: By:  Jasmine Pang M.D. On: 09/09/2020 18:58   NM GI Blood Loss  Result Date: 09/10/2020 CLINICAL DATA:  Right red blood per rectum last night in this morning, on anticoagulation with warfarin. EXAM: NUCLEAR MEDICINE GASTROINTESTINAL BLEEDING SCAN TECHNIQUE: Sequential abdominal images were obtained following intravenous administration of Tc-52m labeled red blood cells. RADIOPHARMACEUTICALS:  20.4 mCi Tc-2m pertechnetate in-vitro labeled red cells. COMPARISON:  09/10/2015 CT pelvis. FINDINGS: No scintigraphic evidence of active GI bleed in the abdomen or pelvis over 2 hours of imaging. Focal radiotracer accumulation in the perineum is favored represent excreted urine accumulating within patient diaper. IMPRESSION: No scintigraphic evidence of active GI bleed. Electronically Signed   By: Delbert Phenix M.D.   On: 09/10/2020 15:11   US RENAL  Result Date: 10/02/2020 CLINICAL DATA:  Acute renal insufficiency EXAM: RENAL / URINARY TRACT ULTRASOUND COMPLETE COMPARISON:  None. FINDINGS: Right Kidney: Renal measurements: 9.4 x 4.8 x 4.8 cm = volume: 112 mL. There is mild renal cortical atrophy noted. The renal cortical echogenicity is within normal limits. 7 mm simple cortical cyst is seen within the interpolar region of the right kidney. Left Kidney: Renal measurements: 9.2 x 4.8 x 3.8 cm = volume: 89 mL. There is mild slightly asymmetric cortical atrophy of the left kidney. Renal parenchymal echogenicity is within normal limits. Multiple simple exophytic cortical cysts are seen arising from the left kidney measuring up to 1.9 cm. Bladder: Appears normal for degree of bladder distention. Bilateral ureteral jets are identified. Other: None. IMPRESSION: Bilateral renal cortical atrophy.  No hydronephrosis. Electronically Signed   By: Helyn Numbers MD   On: 10/02/2020 05:52   ECHOCARDIOGRAM COMPLETE  Result Date: 10/02/2020    ECHOCARDIOGRAM REPORT   Patient Name:   ENRICA CORLISS [facility] Date of Exam: 10/02/2020  Medical Rec #:  [id number]                 Height:       69.0 in Accession #:    [id number]                Weight:       165.0 lb Date of Birth:  09-10-1946                  BSA:          1.904 m Patient Age:    74 years                  BP:           130/75 mmHg Patient Gender: F                         HR:           87 bpm. Exam Location:  [facility] Procedure: 2D Echo, Cardiac Doppler and Color Doppler Indications:     phtn 416.8 Atrial Fibrllation I48.91  History:         Patient has prior history of Echocardiogram examinations, most  recent 01/10/2017. Risk Factors:Hypertension. Mitral valve                  replacement.  Sonographer:     Cristela BlueJerry Hege RDCS (AE) Referring Phys:  [id number] DAVID Kelby FamMANUEL ORTIZ Diagnosing Phys: Julien Nordmannimothy Gollan MD  Sonographer Comments: No apical window and Technically challenging study due to limited acoustic windows. IMPRESSIONS  1. Left ventricular ejection fraction, by estimation, is 55 to 60%. The left ventricle has normal function. The left ventricle has no regional wall motion abnormalities. Left ventricular diastolic parameters are indeterminate.  2. Right ventricular systolic function is normal. The right ventricular size is normal. There is moderately elevated pulmonary artery systolic pressure. The estimated right ventricular systolic pressure is 47.5 mmHg.  3. Left atrial size was mildly dilated.  4. The mitral valve was not well visualized. No evidence of mitral valve regurgitation. No evidence of mitral stenosis. Severe mitral annular calcification. (consider repeat TTE once off bipap for better evaluation, estimation of gradient).  5. Tricuspid valve regurgitation is severe.  6. The aortic valve has moderaet calcification without any evidence of aortic stenosis.  7. There is mild dilatation of the aortic root, measuring 41 mm. FINDINGS  Left Ventricle: Left ventricular ejection fraction, by estimation, is 55 to 60%. The left ventricle has normal function. The left  ventricle has no regional wall motion abnormalities. The left ventricular internal cavity size was normal in size. There is  no left ventricular hypertrophy. Left ventricular diastolic parameters are indeterminate. Right Ventricle: The right ventricular size is normal. No increase in right ventricular wall thickness. Right ventricular systolic function is normal. There is moderately elevated pulmonary artery systolic pressure. The tricuspid regurgitant velocity is 3.26 m/s, and with an assumed right atrial pressure of 5 mmHg, the estimated right ventricular systolic pressure is 47.5 mmHg. Left Atrium: Left atrial size was mildly dilated. Right Atrium: Right atrial size was normal in size. Pericardium: There is no evidence of pericardial effusion. Mitral Valve: The mitral valve was not well visualized. There is severe calcification of the mitral valve leaflet(s). Severe mitral annular calcification. No evidence of mitral valve regurgitation. No evidence of mitral valve stenosis. Tricuspid Valve: The tricuspid valve is normal in structure. Tricuspid valve regurgitation is severe. No evidence of tricuspid stenosis. Aortic Valve: The aortic valve is normal in structure. Aortic valve regurgitation is not visualized. Mild to moderate aortic valve sclerosis/calcification is present, without any evidence of aortic stenosis. Pulmonic Valve: The pulmonic valve was normal in structure. Pulmonic valve regurgitation is not visualized. No evidence of pulmonic stenosis. Aorta: The aortic root is normal in size and structure. There is mild dilatation of the aortic root, measuring 41 mm. Venous: The inferior vena cava is normal in size with greater than 50% respiratory variability, suggesting right atrial pressure of 3 mmHg. IAS/Shunts: No atrial level shunt detected by color flow Doppler. Additional Comments: A pacer wire is visualized.  LEFT VENTRICLE PLAX 2D LVIDd:         4.62 cm LVIDs:         3.22 cm LV PW:         1.11 cm LV  IVS:        1.19 cm LVOT diam:     2.00 cm LVOT Area:     3.14 cm  LEFT ATRIUM         Index LA diam:    4.90 cm 2.57 cm/m  PULMONIC VALVE AORTA                 PV Vmax:        0.79 m/s Ao Root diam: 3.90 cm PV Peak grad:   2.5 mmHg                       RVOT Peak grad: 5 mmHg  TRICUSPID VALVE TR Peak grad:   42.5 mmHg TR Vmax:        326.00 cm/s  SHUNTS Systemic Diam: 2.00 cm Julien Nordmann MD Electronically signed by Julien Nordmann MD Signature Date/Time: 10/02/2020/2:25:55 PM    Final    CUP PACEART REMOTE DEVICE CHECK  Result Date: 09/29/2020 Scheduled remote reviewed. Normal device function.  OAC- Warfarin Next remote 91 days- JBox, RN/CVRS      Subjective: Patient seen and examined.  Resting comfortably on the bed.  On room air.  Communicating well.  Tells me that her breathing has improved and comfortable going to nursing home today.  Remained afebrile.  No acute events overnight. Discharge Exam: Vitals:   10/05/20 0820 10/05/20 1230  BP: (!) 157/71 (!) 145/62  Pulse: 63 64  Resp: 19 19  Temp: 98 F (36.7 C) (!) 97.4 F (36.3 C)  SpO2: 95% 96%   Vitals:   10/04/20 2120 10/05/20 0446 10/05/20 0820 10/05/20 1230  BP: 122/67 132/64 (!) 157/71 (!) 145/62  Pulse: 66 69 63 64  Resp: 17 16 19 19   Temp: (!) 97.5 F (36.4 C) 98.1 F (36.7 C) 98 F (36.7 C) (!) 97.4 F (36.3 C)  TempSrc: Oral Oral Oral Oral  SpO2: 96% 96% 95% 96%  Weight:  77.1 kg    Height:        General: Pt is alert, awake, not in acute distress, on room air, communicating well, elderly looking Cardiovascular: RRR, S1/S2 +, no rubs, no gallops Respiratory: CTA bilaterally, no wheezing, no rhonchi, no crackles Abdominal: Soft, NT, ND, bowel sounds + Extremities: no edema, no cyanosis    The results of significant diagnostics from this hospitalization (including imaging, microbiology, ancillary and laboratory) are listed below for reference.     Microbiology: Recent Results (from  the past 240 hour(s))  Culture, blood (routine x 2)     Status: None (Preliminary result)   Collection Time: 10/01/20  7:00 PM   Specimen: BLOOD  Result Value Ref Range Status   Specimen Description BLOOD BLOOD RIGHT FOREARM  Final   Special Requests   Final    BOTTLES DRAWN AEROBIC AND ANAEROBIC Blood Culture results may not be optimal due to an inadequate volume of blood received in culture bottles   Culture   Final    NO GROWTH 4 DAYS Performed at [facility], [address] [place], [place] [address]    Report Status PENDING  Incomplete  Culture, blood (routine x 2)     Status: None (Preliminary result)   Collection Time: 10/01/20  7:00 PM   Specimen: BLOOD  Result Value Ref Range Status   Specimen Description BLOOD BLOOD LEFT FOREARM  Final   Special Requests   Final    BOTTLES DRAWN AEROBIC AND ANAEROBIC Blood Culture adequate volume   Culture   Final    NO GROWTH 4 DAYS Performed at [facility], [address] [place], [place] [address]    Report Status PENDING  Incomplete  Resp panel by RT-PCR (RSV, Flu A&B, Covid) Nasopharyngeal Swab  Status: None   Collection Time: 10/01/20  7:00 PM   Specimen: Nasopharyngeal Swab; Nasopharyngeal(NP) swabs in vial transport medium  Result Value Ref Range Status   SARS Coronavirus 2 by RT PCR NEGATIVE NEGATIVE Final    Comment: (NOTE) SARS-CoV-2 target nucleic acids are NOT DETECTED.  The SARS-CoV-2 RNA is generally detectable in upper respiratory specimens during the acute phase of infection. The lowest concentration of SARS-CoV-2 viral copies this assay can detect is 138 copies/mL. A negative result does not preclude SARS-Cov-2 infection and should not be used as the sole basis for treatment or other patient management decisions. A negative result may occur with  improper specimen collection/handling, submission of specimen other than nasopharyngeal swab, presence of viral mutation(s) within the areas  targeted by this assay, and inadequate number of viral copies(<138 copies/mL). A negative result must be combined with clinical observations, patient history, and epidemiological information. The expected result is Negative.  Fact Sheet for Patients:  BloggerCourse.com  Fact Sheet for Healthcare Providers:  SeriousBroker.it  This test is no t yet approved or cleared by the [place] FDA and  has been authorized for detection and/or diagnosis of SARS-CoV-2 by FDA under an Emergency Use Authorization (EUA). This EUA will remain  in effect (meaning this test can be used) for the duration of the COVID-19 declaration under Section 564(b)(1) of the Act, 21 U.S.C.section 360bbb-3(b)(1), unless the authorization is terminated  or revoked sooner.       Influenza A by PCR NEGATIVE NEGATIVE Final   Influenza B by PCR NEGATIVE NEGATIVE Final    Comment: (NOTE) The Xpert Xpress SARS-CoV-2/FLU/RSV plus assay is intended as an aid in the diagnosis of influenza from Nasopharyngeal swab specimens and should not be used as a sole basis for treatment. Nasal washings and aspirates are unacceptable for Xpert Xpress SARS-CoV-2/FLU/RSV testing.  Fact Sheet for Patients: BloggerCourse.com  Fact Sheet for Healthcare Providers: SeriousBroker.it  This test is not yet approved or cleared by the [place] FDA and has been authorized for detection and/or diagnosis of SARS-CoV-2 by FDA under an Emergency Use Authorization (EUA). This EUA will remain in effect (meaning this test can be used) for the duration of the COVID-19 declaration under Section 564(b)(1) of the Act, 21 U.S.C. section 360bbb-3(b)(1), unless the authorization is terminated or revoked.     Resp Syncytial Virus by PCR NEGATIVE NEGATIVE Final    Comment: (NOTE) Fact Sheet for  Patients: BloggerCourse.com  Fact Sheet for Healthcare Providers: SeriousBroker.it  This test is not yet approved or cleared by the [place] FDA and has been authorized for detection and/or diagnosis of SARS-CoV-2 by FDA under an Emergency Use Authorization (EUA). This EUA will remain in effect (meaning this test can be used) for the duration of the COVID-19 declaration under Section 564(b)(1) of the Act, 21 U.S.C. section 360bbb-3(b)(1), unless the authorization is terminated or revoked.  Performed at [facility], [address] [place], [place] [address]   MRSA PCR Screening     Status: None   Collection Time: 10/02/20 10:56 AM   Specimen: Nasopharyngeal  Result Value Ref Range Status   MRSA by PCR NEGATIVE NEGATIVE Final    Comment:        The GeneXpert MRSA Assay (FDA approved for NASAL specimens only), is one component of a comprehensive MRSA colonization surveillance program. It is not intended to diagnose MRSA infection nor to guide or monitor treatment for MRSA infections. Performed at [facility], [address] [place], [place] [address]      Labs: BNP (last 3 results) Recent Labs    04/27/20 0911 10/01/20 1355  BNP 56.0 258.8*   Basic Metabolic Panel: Recent Labs  Lab 10/01/20 1355 10/02/20 0407 10/03/20 0436 10/04/20 0504 10/05/20 0409  NA 133* 137 137 135 136  K 4.2 4.0 3.7 4.3 4.5  CL 98 104 103 105 104  CO2 23 23 25 24 24   GLUCOSE 104* 151* 126* 138* 122*  BUN 45* 44* 42* 45* 39*  CREATININE 1.94* 1.41* 1.13* 1.12* 1.05*  CALCIUM 10.1 9.4 10.0 9.7 9.8  MG  --   --   --   --  2.1   Liver Function Tests: Recent Labs  Lab 10/01/20 1601  AST 33  ALT 14  ALKPHOS 83  BILITOT 1.2  PROT 7.2  ALBUMIN 3.5   No results for input(s): LIPASE, AMYLASE in the last 168 hours. No results for input(s): AMMONIA in the last 168 hours. CBC: Recent Labs  Lab  10/01/20 1355 10/02/20 0407 10/03/20 0436 10/04/20 0504 10/05/20 0409  WBC 19.2* 16.7* 14.1* 12.1* 14.7*  NEUTROABS  --  15.6* 12.8* 10.4* 12.2*  HGB 10.9* 10.4* 9.9* 9.1* 9.6*  HCT 34.8* 31.8* 31.2* 27.8* 29.3*  MCV 97.5 96.4 96.9 94.9 94.8  PLT 257 227 247 232 260   Cardiac Enzymes: No results for input(s): CKTOTAL, CKMB, CKMBINDEX, TROPONINI in the last 168 hours. BNP: Invalid input(s): POCBNP CBG: No results for input(s): GLUCAP in the last 168 hours. D-Dimer No results for input(s): DDIMER in the last 72 hours. Hgb A1c No results for input(s): HGBA1C in the last 72 hours. Lipid Profile No results for input(s): CHOL, HDL, LDLCALC, TRIG, CHOLHDL, LDLDIRECT in the last 72 hours. Thyroid function studies No results for input(s): TSH, T4TOTAL, T3FREE, THYROIDAB in the last 72 hours.  Invalid input(s): FREET3 Anemia work up No results for input(s): VITAMINB12, FOLATE, FERRITIN, TIBC, IRON, RETICCTPCT in the last 72 hours. Urinalysis    Component Value Date/Time   COLORURINE YELLOW (A) 10/02/2020 0606   APPEARANCEUR CLOUDY (A) 10/02/2020 0606   LABSPEC 1.019 10/02/2020 0606   PHURINE 5.0 10/02/2020 0606   GLUCOSEU NEGATIVE 10/02/2020 0606   HGBUR MODERATE (A) 10/02/2020 0606   BILIRUBINUR NEGATIVE 10/02/2020 0606   KETONESUR NEGATIVE 10/02/2020 0606   PROTEINUR 30 (A) 10/02/2020 0606   NITRITE NEGATIVE 10/02/2020 0606   LEUKOCYTESUR NEGATIVE 10/02/2020 0606   Sepsis Labs Invalid input(s): PROCALCITONIN,  WBC,  LACTICIDVEN Microbiology Recent Results (from the past 240 hour(s))  Culture, blood (routine x 2)     Status: None (Preliminary result)   Collection Time: 10/01/20  7:00 PM   Specimen: BLOOD  Result Value Ref Range Status   Specimen Description BLOOD BLOOD RIGHT FOREARM  Final   Special Requests   Final    BOTTLES DRAWN AEROBIC AND ANAEROBIC Blood Culture results may not be optimal due to an inadequate volume of blood received in culture bottles   Culture    Final    NO GROWTH 4 DAYS Performed at [facility], [address] [place], [place] [address]    Report Status PENDING  Incomplete  Culture, blood (routine x 2)     Status: None (Preliminary result)   Collection Time: 10/01/20  7:00 PM   Specimen: BLOOD  Result Value Ref Range Status   Specimen Description BLOOD BLOOD LEFT FOREARM  Final   Special Requests   Final    BOTTLES DRAWN AEROBIC AND  ANAEROBIC Blood Culture adequate volume   Culture   Final    NO GROWTH 4 DAYS Performed at [facility], [address] [place], [place] [address]    Report Status PENDING  Incomplete  Resp panel by RT-PCR (RSV, Flu A&B, Covid) Nasopharyngeal Swab     Status: None   Collection Time: 10/01/20  7:00 PM   Specimen: Nasopharyngeal Swab; Nasopharyngeal(NP) swabs in vial transport medium  Result Value Ref Range Status   SARS Coronavirus 2 by RT PCR NEGATIVE NEGATIVE Final    Comment: (NOTE) SARS-CoV-2 target nucleic acids are NOT DETECTED.  The SARS-CoV-2 RNA is generally detectable in upper respiratory specimens during the acute phase of infection. The lowest concentration of SARS-CoV-2 viral copies this assay can detect is 138 copies/mL. A negative result does not preclude SARS-Cov-2 infection and should not be used as the sole basis for treatment or other patient management decisions. A negative result may occur with  improper specimen collection/handling, submission of specimen other than nasopharyngeal swab, presence of viral mutation(s) within the areas targeted by this assay, and inadequate number of viral copies(<138 copies/mL). A negative result must be combined with clinical observations, patient history, and epidemiological information. The expected result is Negative.  Fact Sheet for Patients:  BloggerCourse.com  Fact Sheet for Healthcare Providers:  SeriousBroker.it  This test is no t yet approved or  cleared by the [place] FDA and  has been authorized for detection and/or diagnosis of SARS-CoV-2 by FDA under an Emergency Use Authorization (EUA). This EUA will remain  in effect (meaning this test can be used) for the duration of the COVID-19 declaration under Section 564(b)(1) of the Act, 21 U.S.C.section 360bbb-3(b)(1), unless the authorization is terminated  or revoked sooner.       Influenza A by PCR NEGATIVE NEGATIVE Final   Influenza B by PCR NEGATIVE NEGATIVE Final    Comment: (NOTE) The Xpert Xpress SARS-CoV-2/FLU/RSV plus assay is intended as an aid in the diagnosis of influenza from Nasopharyngeal swab specimens and should not be used as a sole basis for treatment. Nasal washings and aspirates are unacceptable for Xpert Xpress SARS-CoV-2/FLU/RSV testing.  Fact Sheet for Patients: BloggerCourse.com  Fact Sheet for Healthcare Providers: SeriousBroker.it  This test is not yet approved or cleared by the [place] FDA and has been authorized for detection and/or diagnosis of SARS-CoV-2 by FDA under an Emergency Use Authorization (EUA). This EUA will remain in effect (meaning this test can be used) for the duration of the COVID-19 declaration under Section 564(b)(1) of the Act, 21 U.S.C. section 360bbb-3(b)(1), unless the authorization is terminated or revoked.     Resp Syncytial Virus by PCR NEGATIVE NEGATIVE Final    Comment: (NOTE) Fact Sheet for Patients: BloggerCourse.com  Fact Sheet for Healthcare Providers: SeriousBroker.it  This test is not yet approved or cleared by the [place] FDA and has been authorized for detection and/or diagnosis of SARS-CoV-2 by FDA under an Emergency Use Authorization (EUA). This EUA will remain in effect (meaning this test can be used) for the duration of the COVID-19 declaration under Section 564(b)(1) of the Act, 21  U.S.C. section 360bbb-3(b)(1), unless the authorization is terminated or revoked.  Performed at [facility], [address] [place], [place] [address]   MRSA PCR Screening     Status: None   Collection Time: 10/02/20 10:56 AM   Specimen: Nasopharyngeal  Result Value Ref Range Status   MRSA by PCR NEGATIVE NEGATIVE Final  Comment:        The GeneXpert MRSA Assay (FDA approved for NASAL specimens only), is one component of a comprehensive MRSA colonization surveillance program. It is not intended to diagnose MRSA infection nor to guide or monitor treatment for MRSA infections. Performed at [facility], [address] [place], [place] [address]      Time coordinating discharge: Over 30 minutes  SIGNED:   Ollen Bowl, MD  [facility] 10/05/2020, 1:31 PM Pager   If 7PM-7AM, please contact night-coverage [email or web address]

## 2020-10-05 NOTE — Progress Notes (Signed)
Bipap refused by pt 

## 2020-10-05 NOTE — Progress Notes (Signed)
Report called to [facility]. Patient transported via EMS at this time. AVS and prescriptions in packet with transport.

## 2020-10-06 LAB — CULTURE, BLOOD (ROUTINE X 2)
Culture: NO GROWTH
Culture: NO GROWTH
Special Requests: ADEQUATE

## 2020-10-06 LAB — SAR COV2 SEROLOGY (COVID19)AB(IGG),IA: SARS-CoV-2 Ab, IgG: NONREACTIVE

## 2020-10-07 NOTE — Progress Notes (Signed)
Patient ID: Rhonda Griffith, female    DOB: 20-Jul-1946, 75 y.o.   MRN: [id number]  HPI  Rhonda Griffith is a 75 y/o female with a history of HTN, CKD, atrial fibrillation, mitral/ aortic valve stenosis,  restless leg, CVA, PAD, previous tobacco use and chronic heart failure.   Echo report from 10/02/20 reviewed and showed an EF of 55-60% along with moderately elevated PA pressure of 47.5 mmHg and severe TR. Echo report from 01/10/17 reviewed and showed an EF of 55-60% along with moderate LVH and trivial MR.   Admitted 10/01/20 due to shortness of breath and cough. Given supplemental oxygen and bipap as needed. IV antibiotics given for pneumonia with transition to oral medications. Placed on steroids and weaned off of oxygen. Elevated troponin thought to be due to demand ischemia. Discharged after 4 days. Admitted 09/10/20 due to BRBPR. GI consult obtained. Initially given IV protonix and then transitioned to oral medication. Colonoscopy done which showed non-bleeding internal hemorrhoids. Since no active bleeding, warfarin resumed. PT eval done due to weakness. Discharged after 2 days with home PT. Was in the ED 09/09/20 due to mechanical fall. Landed on her buttocks and then hit her head. Xray and CT's showed no acute process and she was released. Was in the ED 04/27/20 due to shortness of breath. IV lasix given with improvement of her symptoms and she was released.   She presents today for a follow-up visit with a chief complaint of moderate shortness of breath with little exertion. She describes this as chronic in nature having been present for several months although she does feel like it's a little bit worse recently. Says that she's getting ready to begin a short course of prednisone. She has associated fatigue, occasional pedal edema, light-headedness and easy bruising along with this. She denies any difficulty sleeping, abdominal distention, palpitations, chest pain or cough.   Says that she's not  getting weighed daily at [facility]. Getting ready to start physical therapy.   Past Medical History:  Diagnosis Date  . Atrial fibrillation, permanent (HCC)    a. CHA2DS2VASc = 4-->coumadin;  b. 09/2014 Echo: EF 55-60%, normal wall motion, mild AI/AS, nl MV, mod dil LA, mildly dil RA, mild to mod TR, PASP .  . CHF (congestive heart failure) (HCC)   . Chronic kidney disease, stage I   . CVA (cerebral infarction)    a. 04/2015 - anticoagulation switched from eliquis to xarelto to coumadin.  [name] Hypertension   . Leukocytosis   . Pacemaker -[employer]tronic    a. implant for SSS  . PAD (peripheral artery disease) (HCC)    a. w/ left lower ext claudication s/p ABI's 04/2015 showing nl ABI on Right with abnl waveforms on left sugg of L SFA dzs.  [name] Restless leg syndrome   . 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   Past Surgical History:  Procedure Laterality Date  . APPENDECTOMY    . bladder injection    . botox bladder    . CHOLECYSTECTOMY    . COLONOSCOPY WITH PROPOFOL N/A 09/11/2020   Procedure: COLONOSCOPY WITH PROPOFOL;  Surgeon: Midge Minium, MD;  Location: [facility] ENDOSCOPY;  Service: Endoscopy;  Laterality: N/A;  . HIP ARTHROPLASTY Right 04/13/2019   Procedure: ARTHROPLASTY BIPOLAR HIP (HEMIARTHROPLASTY);  Surgeon: Donato Heinz, MD;  Location: [facility] ORS;  Service: Orthopedics;  Laterality: Right;  . INSERT / REPLACE / REMOVE PACEMAKER  implant for SSS  . MELANOMA EXCISION     face  . MITRAL VALVE REPLACEMENT     s/p mechanical valve replaced with bovine valve 2010  . NASAL SINUS SURGERY    . PPM GENERATOR CHANGEOUT N/A 09/21/2018   Procedure: PPM GENERATOR CHANGEOUT;  Surgeon: Duke SalviaKlein, Steven C, MD;  Location: [facility] INVASIVE CV LAB;  Service: Cardiovascular;  Laterality: N/A;  . TONSILLECTOMY     Family History  Problem Relation Age of Onset  . Hypertension Other    Social History   Tobacco Use  .  Smoking status: Former Smoker    Packs/day: 1.00    Years: 5.00    Pack years: 5.00    Types: Cigarettes    Quit date: 12/29/1970    Years since quitting: 49.8  . Smokeless tobacco: Never Used  Substance Use Topics  . Alcohol use: No   Allergies  Allergen Reactions  . Cephalexin Rash  . Latex Rash  . Morphine Nausea And Vomiting  . Tizanidine Palpitations  . Liothyronine Other (See Comments)  . Other Other (See Comments)    Several different statins.  . Statins Other (See Comments)    Several different statins.  . Tape Other (See Comments)    Breaks her skin Allergic to plastic/latex tape. Only use paper tape on patient. Breaks her skin: Allergic to plastic/latex tape. Only use paper tape on patient.  . Tapentadol Other (See Comments)  . Sulfa Antibiotics Rash and Other (See Comments)   Prior to Admission medications   Medication Sig Start Date End Date Taking? Authorizing Provider  acetaminophen (TYLENOL) 650 MG CR tablet Take 650 mg by mouth every 8 (eight) hours as needed for pain.   Yes [provider]  albuterol (VENTOLIN HFA) 108 (90 Base) MCG/ACT inhaler Inhale 2 puffs into the lungs every 4 (four) hours as needed for wheezing or shortness of breath.   Yes [provider]  alendronate (FOSAMAX) 70 MG tablet Take 70 mg by mouth once a week.  08/19/19  Yes [provider]  ascorbic acid (VITAMIN C) 500 MG tablet Take 500 mg by mouth daily.   Yes [provider]  atorvastatin (LIPITOR) 80 MG tablet Take 80 mg by mouth every evening.    Yes [provider]  benzonatate (TESSALON) 100 MG capsule Take 100 mg by mouth 3 (three) times daily. 09/29/20  Yes [provider]  Calcium Carb-Cholecalciferol (OYSTER SHELL CALCIUM) 500-400 MG-UNIT TABS Take 1 tablet by mouth 2 (two) times daily. 07/18/19  Yes [provider]  Cholecalciferol (VITAMIN D3) 1000 UNITS CAPS Take 2,000 Units by mouth every 30 (thirty) days.    Yes  [provider]  clobetasol cream (TEMOVATE) 0.05 % Apply 1 application topically 2 (two) times daily.   Yes [provider]  cyclobenzaprine (FLEXERIL) 5 MG tablet Take 5 mg by mouth 2 (two) times daily as needed for muscle spasms. 07/18/19  Yes [provider]  diltiazem (CARDIZEM CD) 120 MG 24 hr capsule Take 1 capsule (120 mg total) by mouth daily. 09/06/16  Yes Antonieta IbaGollan, Timothy J, MD  docusate sodium (COLACE) 100 MG capsule Take 100 mg by mouth daily as needed for mild constipation.   Yes [provider]  DULoxetine (CYMBALTA) 60 MG capsule Take 1 capsule (60 mg total) by mouth daily. 10/05/20  Yes Pahwani, Rinka R, MD  FLUoxetine (PROZAC) 40 MG capsule Take 40 mg by mouth at bedtime. 09/09/20  Yes [provider]  fluticasone Aleda Grana(FLONASE)  50 MCG/ACT nasal spray 2 sprays by Each Nare route daily as needed for allergies. 06/27/18  Yes [provider]  gabapentin (NEURONTIN) 100 MG capsule Take 100 mg by mouth 2 (two) times daily. 04/30/19  Yes [provider]  guaiFENesin (MUCINEX) 600 MG 12 hr tablet Take 600 mg by mouth 2 (two) times daily as needed for cough or to loosen phlegm.   Yes [provider]  magnesium oxide (MAG-OX) 400 MG tablet Take 400 mg by mouth daily.  07/30/18  Yes [provider]  pantoprazole (PROTONIX) 40 MG tablet Take 40 mg by mouth daily.    Yes [provider]  rOPINIRole (REQUIP) 2 MG tablet Take 2 mg by mouth at bedtime.  05/29/15  Yes [provider]  warfarin (COUMADIN) 2.5 MG tablet Take 1 tablet (2.5 mg total) by mouth daily. 10/08/20  Yes Pahwani, Rinka R, MD  predniSONE (DELTASONE) 20 MG tablet Take 2 tablets (40 mg total) by mouth daily with breakfast for 3 days. 10/06/20 10/09/20  Pahwani, Kasandra Knudsen, MD    Review of Systems  Constitutional: Positive for fatigue. Negative for appetite change.  HENT: Negative for congestion, postnasal drip and sore throat.   Eyes: Negative.    Respiratory: Positive for shortness of breath (easily). Negative for cough and chest tightness.   Cardiovascular: Positive for leg swelling. Negative for chest pain and palpitations.  Gastrointestinal: Negative for abdominal distention and abdominal pain.  Endocrine: Negative.   Genitourinary: Negative.   Musculoskeletal: Negative for back pain and neck pain.  Skin: Negative.   Allergic/Immunologic: Negative.   Neurological: Positive for light-headedness. Negative for dizziness.  Hematological: Negative for adenopathy. Bruises/bleeds easily.  Psychiatric/Behavioral: Negative for dysphoric mood and sleep disturbance (sleeping on 1 wedge and 1 pillow). The patient is not nervous/anxious.    Vitals:   10/08/20 1027 10/08/20 1038  BP: (!) 163/76 (!) 147/98  Pulse: 93 81  Resp: 16   SpO2: 98%   Weight: 168 lb (76.2 kg)   Height: 5\' 9"  (1.753 m)    Wt Readings from Last 3 Encounters:  10/08/20 168 lb (76.2 kg)  10/05/20 169 lb 15.6 oz (77.1 kg)  09/10/20 167 lb 15.9 oz (76.2 kg)   Lab Results  Component Value Date   CREATININE 1.05 (H) 10/05/2020   CREATININE 1.12 (H) 10/04/2020   CREATININE 1.13 (H) 10/03/2020    Physical Exam Vitals and nursing note reviewed.  Constitutional:      Appearance: Normal appearance.  HENT:     Head: Normocephalic and atraumatic.  Cardiovascular:     Rate and Rhythm: Normal rate. Rhythm irregular.  Pulmonary:     Effort: Pulmonary effort is normal. No respiratory distress.     Breath sounds: No wheezing or rales.  Abdominal:     General: There is no distension.     Palpations: Abdomen is soft.  Musculoskeletal:        General: No tenderness.     Cervical back: Normal range of motion and neck supple.     Right lower leg: No edema.     Left lower leg: No edema.  Skin:    General: Skin is warm and dry.  Neurological:     General: No focal deficit present.     Mental Status: She is alert and oriented to person, place, and time.   Psychiatric:        Mood and Affect: Mood normal.        Behavior: Behavior normal.  Thought Content: Thought content normal.    Assessment & Plan:  1: Chronic heart failure with preserved ejection fraction without structural changes (LVH)- - NYHA class III - euvolemic today - order written for her to be weighed daily and to call for an overnight weight gain of >2 pounds or a weekly weight gain of >5 pounds - weight down 8 pounds from last visit here 3 months ago - not adding salt and doesn't think the food at [facility] tastes salty - saw cardiology Mariah Milling) 07/29/20 - is scheduled for echo on 10/16/20 - says that she will be starting PT soon  - reports receiving three covid vaccines - BNP 10/01/20 was 258.8 - reports getting her flu vaccine for this season  2: HTN- - BP elevated but she's feeling more short of breath today - had telemedicine visit with PCP (Sh[facility]ley-Quinn) 09/15/20 - BMP 10/05/20 reviewed and showed sodium 136, potassium 4.5, creatinine 1.05 and GFR 55  3: Permanent atrial fibrillation- - pacemaker present - saw EP Graciela Husbands) 03/31/20 - INR 10/05/20 was 2.8  4: Pulmonary HTN- - PA pressure moderately elevated on recent echo - may benefit from pulmonary referral.  - says that she will be starting a short course of prednisone   Facility medication list reviewed.   Return in 6 months or sooner for any questions/problems before then.

## 2020-10-08 ENCOUNTER — Ambulatory Visit: Payer: Medicare Other | Attending: Family | Admitting: Family

## 2020-10-08 ENCOUNTER — Encounter: Payer: Self-pay | Admitting: Family

## 2020-10-08 ENCOUNTER — Other Ambulatory Visit: Payer: Self-pay

## 2020-10-08 VITALS — BP 147/98 | HR 81 | Resp 16 | Ht 69.0 in | Wt 168.0 lb

## 2020-10-08 DIAGNOSIS — Z8673 Personal history of transient ischemic attack (TIA), and cerebral infarction without residual deficits: Secondary | ICD-10-CM | POA: Diagnosis not present

## 2020-10-08 DIAGNOSIS — Z8249 Family history of ischemic heart disease and other diseases of the circulatory system: Secondary | ICD-10-CM | POA: Insufficient documentation

## 2020-10-08 DIAGNOSIS — Z7901 Long term (current) use of anticoagulants: Secondary | ICD-10-CM | POA: Insufficient documentation

## 2020-10-08 DIAGNOSIS — I130 Hypertensive heart and chronic kidney disease with heart failure and stage 1 through stage 4 chronic kidney disease, or unspecified chronic kidney disease: Secondary | ICD-10-CM | POA: Diagnosis present

## 2020-10-08 DIAGNOSIS — Z885 Allergy status to narcotic agent status: Secondary | ICD-10-CM | POA: Insufficient documentation

## 2020-10-08 DIAGNOSIS — Z7952 Long term (current) use of systemic steroids: Secondary | ICD-10-CM | POA: Diagnosis not present

## 2020-10-08 DIAGNOSIS — R5383 Other fatigue: Secondary | ICD-10-CM | POA: Diagnosis not present

## 2020-10-08 DIAGNOSIS — Z888 Allergy status to other drugs, medicaments and biological substances status: Secondary | ICD-10-CM | POA: Insufficient documentation

## 2020-10-08 DIAGNOSIS — Z882 Allergy status to sulfonamides status: Secondary | ICD-10-CM | POA: Insufficient documentation

## 2020-10-08 DIAGNOSIS — N181 Chronic kidney disease, stage 1: Secondary | ICD-10-CM | POA: Insufficient documentation

## 2020-10-08 DIAGNOSIS — R42 Dizziness and giddiness: Secondary | ICD-10-CM | POA: Insufficient documentation

## 2020-10-08 DIAGNOSIS — Z950 Presence of cardiac pacemaker: Secondary | ICD-10-CM | POA: Diagnosis not present

## 2020-10-08 DIAGNOSIS — R0602 Shortness of breath: Secondary | ICD-10-CM | POA: Diagnosis not present

## 2020-10-08 DIAGNOSIS — I4821 Permanent atrial fibrillation: Secondary | ICD-10-CM | POA: Diagnosis not present

## 2020-10-08 DIAGNOSIS — I5032 Chronic diastolic (congestive) heart failure: Secondary | ICD-10-CM | POA: Insufficient documentation

## 2020-10-08 DIAGNOSIS — Z881 Allergy status to other antibiotic agents status: Secondary | ICD-10-CM | POA: Insufficient documentation

## 2020-10-08 DIAGNOSIS — I272 Pulmonary hypertension, unspecified: Secondary | ICD-10-CM | POA: Diagnosis not present

## 2020-10-08 DIAGNOSIS — Z87891 Personal history of nicotine dependence: Secondary | ICD-10-CM | POA: Diagnosis not present

## 2020-10-08 DIAGNOSIS — Z79899 Other long term (current) drug therapy: Secondary | ICD-10-CM | POA: Insufficient documentation

## 2020-10-08 DIAGNOSIS — Z952 Presence of prosthetic heart valve: Secondary | ICD-10-CM | POA: Diagnosis not present

## 2020-10-08 DIAGNOSIS — I10 Essential (primary) hypertension: Secondary | ICD-10-CM

## 2020-10-08 NOTE — Patient Instructions (Signed)
Continue weighing daily and call for an overnight weight gain of > 2 pounds or a weekly weight gain of >5 pounds. 

## 2020-10-14 NOTE — Progress Notes (Signed)
Remote pacemaker transmission.   

## 2020-10-16 ENCOUNTER — Ambulatory Visit: Payer: Medicare Other

## 2020-10-16 ENCOUNTER — Other Ambulatory Visit: Payer: Self-pay

## 2020-10-16 DIAGNOSIS — I059 Rheumatic mitral valve disease, unspecified: Secondary | ICD-10-CM

## 2020-10-16 DIAGNOSIS — R600 Localized edema: Secondary | ICD-10-CM

## 2020-10-19 ENCOUNTER — Other Ambulatory Visit: Payer: Self-pay

## 2020-10-19 ENCOUNTER — Encounter: Payer: Self-pay | Admitting: Podiatry

## 2020-10-19 ENCOUNTER — Ambulatory Visit (INDEPENDENT_AMBULATORY_CARE_PROVIDER_SITE_OTHER): Payer: Medicare Other | Admitting: Podiatry

## 2020-10-19 DIAGNOSIS — M79676 Pain in unspecified toe(s): Secondary | ICD-10-CM | POA: Diagnosis not present

## 2020-10-19 DIAGNOSIS — B351 Tinea unguium: Secondary | ICD-10-CM | POA: Diagnosis not present

## 2020-10-19 DIAGNOSIS — I739 Peripheral vascular disease, unspecified: Secondary | ICD-10-CM

## 2020-10-19 DIAGNOSIS — Q828 Other specified congenital malformations of skin: Secondary | ICD-10-CM

## 2020-10-19 DIAGNOSIS — N189 Chronic kidney disease, unspecified: Secondary | ICD-10-CM

## 2020-10-19 NOTE — Progress Notes (Signed)
This patient returns to my office for at risk foot care.  This patient requires this care by a professional since this patient will be at risk due to having PAD, CKD and coagulation defect.   Patient is taking coumadin.  This patient is unable to cut nails himself since the patient cannot reach his nails.These nails are painful walking and wearing shoes.  This patient presents for at risk foot care today.  General Appearance  Alert, conversant and in no acute stress.  Vascular  Dorsalis pedis and posterior tibial  pulses are weakly  palpable  bilaterally.  Capillary return is within normal limits  bilaterally. Temperature is within normal limits  bilaterally.  Neurologic  Senn-Weinstein monofilament wire test within normal limits  bilaterally. Muscle power within normal limits bilaterally.  Nails Thick disfigured discolored nails with subungual debris  from hallux to fifth toes bilaterally. No evidence of bacterial infection or drainage bilaterally.  Orthopedic  No limitations of motion  feet .  No crepitus or effusions noted.  No bony pathology or digital deformities noted.  Skin  Porokeratosis sub 5th met left foot.  No signs of infections or ulcers noted.     Onychomycosis  Pain in right toes  Pain in left toes Porokeratosis sub 5th left foot.  Consent was obtained for treatment procedures.   Mechanical debridement of nails 1-5  bilaterally performed with a nail nipper.  Filed with dremel without incident. Debride porokeratosis sub 5th with # 15 blade.  Return office visit   3 months                   Told patient to return for periodic foot care and evaluation due to potential at risk complications.   Gardiner Barefoot DPM

## 2020-12-29 ENCOUNTER — Ambulatory Visit: Payer: Medicare Other

## 2020-12-30 LAB — CUP PACEART REMOTE DEVICE CHECK
Battery Remaining Longevity: 119 mo
Battery Voltage: 3.01 V
Brady Statistic RV Percent Paced: 85.05 %
Date Time Interrogation Session: 20220405023208
Implantable Lead Implant Date: 20090317
Implantable Lead Location: 753860
Implantable Lead Model: 5076
Implantable Pulse Generator Implant Date: 20191227
Lead Channel Impedance Value: 380 Ohm
Lead Channel Impedance Value: 437 Ohm
Lead Channel Pacing Threshold Amplitude: 0.625 V
Lead Channel Pacing Threshold Pulse Width: 0.4 ms
Lead Channel Sensing Intrinsic Amplitude: 7.875 mV
Lead Channel Sensing Intrinsic Amplitude: 7.875 mV
Lead Channel Setting Pacing Amplitude: 2.5 V
Lead Channel Setting Pacing Pulse Width: 0.4 ms
Lead Channel Setting Sensing Sensitivity: 1.2 mV

## 2021-01-18 ENCOUNTER — Ambulatory Visit: Payer: Medicare Other | Admitting: Podiatry

## 2021-01-22 NOTE — Progress Notes (Deleted)
Cardiology Office Note  Date:  01/22/2021   ID:  Rhonda, Griffith 1946/05/13, MRN [id number]  PCP:  [name], MD   No chief complaint on file.   HPI:   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 Graciela Husbands July 2021 Underwent generator change 2019  Presents with daughter, Activity limited by leg weakness which has been getting worse over the past year or 2.  Lots of time in the wheelchair Family getting concerned if legs get weaker they would not be able to care for her at home No falls  Over the summer, not taking lasix Denied eating out, no processed foods,  Denied excess fluid intake   in hospital, 04/27/2020 Woke up with SOB, ankle swelling  CR 1.34, BUN 28 Dry weight now 162 to 165, Taking lasix 20 daily, no recent BMP  No recent echo Echo 12/2016 Ef normal Well functioning mitral valve  EKG showing V paced rhythm, rate 81 bpm  Other past medical history reviewed History of heavy vaginal bleeding, none recently Chronic left lower extremity toe drop, foot drop.  Statin was held in the past for leg pain, Zocor.  Previous spinal tap at [facility] for high pressure in her ventricle. She reports no significant improvement after this procedure (drain?).  Placement of a bladder stimulator implant for overactive bladder was denied despite cardiac clearance .    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  PSH:    Past Surgical History:  Procedure Laterality Date  . APPENDECTOMY    . bladder injection    . botox bladder    . CHOLECYSTECTOMY    . COLONOSCOPY WITH PROPOFOL N/A 09/11/2020   Procedure: COLONOSCOPY WITH PROPOFOL;  Surgeon: Midge Minium, MD;  Location: [facility] ENDOSCOPY;  Service: Endoscopy;  Laterality: N/A;  . HIP ARTHROPLASTY Right 04/13/2019   Procedure: ARTHROPLASTY BIPOLAR HIP (HEMIARTHROPLASTY);  Surgeon: Donato Heinz, MD;  Location: [facility] ORS;  Service: Orthopedics;  Laterality: Right;  . INSERT / REPLACE / REMOVE PACEMAKER     implant for SSS  . MELANOMA EXCISION     face  . MITRAL VALVE REPLACEMENT     s/p mechanical valve replaced with bovine valve 2010  . NASAL SINUS SURGERY    . PPM GENERATOR CHANGEOUT N/A 09/21/2018   Procedure: PPM GENERATOR CHANGEOUT;  Surgeon: [facility] Salvia, MD;  Location: [facility] INVASIVE CV LAB;  Service: Cardiovascular;  Laterality: N/A;  . TONSILLECTOMY      Current Outpatient Medications  Medication Sig Dispense Refill  . acetaminophen (TYLENOL) 650 MG CR tablet Take 650 mg by mouth every 8 (eight) hours as needed for pain.    [name] albuterol (VENTOLIN HFA) 108 (90 Base) MCG/ACT  inhaler Inhale 2 puffs into the lungs every 4 (four) hours as needed for wheezing or shortness of breath.    [name] alendronate (FOSAMAX) 70 MG tablet Take 70 mg by mouth once a week.     [name] ascorbic acid (VITAMIN C) 500 MG tablet Take 500 mg by mouth daily.    [name] atorvastatin (LIPITOR) 80 MG tablet Take 80 mg by mouth every evening.     . benzonatate (TESSALON) 100 MG capsule Take 100 mg by mouth 3 (three) times daily.    . Calcium Carb-Cholecalciferol (OYSTER SHELL CALCIUM)  500-400 MG-UNIT TABS Take 1 tablet by mouth 2 (two) times daily.    . Cholecalciferol (VITAMIN D3) 1000 UNITS CAPS Take 2,000 Units by mouth every 30 (thirty) days.     . clobetasol cream (TEMOVATE) 0.05 % Apply 1 application topically 2 (two) times daily.    . cyclobenzaprine (FLEXERIL) 5 MG tablet Take 5 mg by mouth 2 (two) times daily as needed for muscle spasms.    [name] diltiazem (CARDIZEM CD) 120 MG 24 hr capsule Take 1 capsule (120 mg total) by mouth daily. 90 capsule 3  . docusate sodium (COLACE) 100 MG capsule Take 100 mg by mouth daily as needed for mild constipation.    . DULoxetine (CYMBALTA) 60 MG capsule Take 1 capsule (60 mg total) by mouth daily. 90 each 0  . FLUoxetine (PROZAC) 40 MG capsule Take 40 mg by mouth at bedtime.    . fluticasone (FLONASE) 50 MCG/ACT nasal spray 2 sprays by Each Nare route daily as needed for allergies.    [name] gabapentin (NEURONTIN) 100 MG capsule Take 100 mg by mouth 2 (two) times daily.    [name] guaiFENesin (MUCINEX) 600 MG 12 hr tablet Take 600 mg by mouth 2 (two) times daily as needed for cough or to loosen phlegm.    . magnesium oxide (MAG-OX) 400 MG tablet Take 400 mg by mouth daily.     . pantoprazole (PROTONIX) 40 MG tablet Take 40 mg by mouth daily.     [name] rOPINIRole (REQUIP) 2 MG tablet Take 2 mg by mouth at bedtime.     [name] warfarin (COUMADIN) 2.5 MG tablet Take 1 tablet (2.5 mg total) by mouth daily. 30 tablet 0   No current facility-administered medications for this visit.     Allergies:   Cephalexin, Latex, Morphine, Tizanidine, Liothyronine, Other, Statins, Tape, Tapentadol, and Sulfa antibiotics   Social History:  The patient  reports that she quit smoking about 50 years ago. Her smoking use included cigarettes. She has a 5.00 pack-year smoking history. She has never used smokeless tobacco. She reports that she does not drink alcohol and does not use drugs.   Family History:   family history includes Hypertension in an other family member.     Review of Systems: Review of Systems  Constitutional: Negative.   Respiratory: Negative.   Cardiovascular: Negative.   Gastrointestinal: Negative.   Musculoskeletal: Positive for joint pain and myalgias.       Gait instability  Neurological: Negative.   Psychiatric/Behavioral: Negative.   All other systems reviewed and are negative.    PHYSICAL EXAM: VS:  There were no vitals taken for this visit. , BMI There is no height or weight on file to calculate BMI. Constitutional:  oriented to person, place, and time. No distress.  HENT:  Head: Grossly normal Eyes:  no discharge. No scleral icterus.  Neck: No JVD, no carotid bruits  Cardiovascular: Regular rate and rhythm,  no murmurs appreciated Pulmonary/Chest: Clear to auscultation bilaterally, no wheezes or rails Abdominal: Soft.  no distension.  no tenderness.  Musculoskeletal: Normal range of motion Neurological:  normal muscle tone. Coordination normal. No atrophy Skin: Skin warm and dry Psychiatric: normal affect, pleasant   Recent Labs: 10/01/2020: ALT 14; B Natriuretic Peptide 258.8 10/05/2020: BUN 39; Creatinine, Ser 1.05; Hemoglobin 9.6; Magnesium 2.1; Platelets 260; Potassium 4.5; Sodium 136    Lipid Panel No results found for: CHOL, HDL, LDLCALC, TRIG    Wt Readings from Last 3 Encounters:  10/08/20 168 lb (76.2 kg)  10/05/20 169 lb 15.6 oz (77.1 kg)  09/10/20 167 lb 15.9 oz (76.2 kg)       ASSESSMENT AND PLAN:  Atrial fibrillation, unspecified type (HCC) - Plan: EKG 12-Lead On anticoagulation,  paced rhythm Followed by Dr. Graciela Husbands  Chronic diastolic heart failure [facility]) - Plan: EKG 12-Lead Previously stopped Lasix on her own given urinary incontinence Recent lower extremity edema and shortness of breath, now back on Lasix daily Baseline weight appears 162 up to 165, this has been maintained Weights daily at home Appears weight was 176 back in September 2021 now down 10 pounds Long discussion concerning  need to restrict some of her fluids, salt intake but they do not sound excessive -Repeat echocardiogram pending  Essential hypertension - Plan: EKG 12-Lead Blood pressure is well controlled on today's visit. No changes made to the medications.  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 - Plan: EKG 12-Lead Recent echocardiogram April 2018 with stable valve disease Repeat echocardiogram pending  PAD (peripheral artery disease) (HCC) Mild depression of her ABI lower extremities, She has stable lower extremity pulses Chronic leg pain noncardiac/noncardiovascular, chronic arthritis pain Very sedentary at baseline Previously took herself off statin, appears back on Lipitor  Pure hypercholesterolemia Continue Lipitor as above  Mitral valve replaced Stable on echo April 2018 Repeat echo pending given trip to the emergency room for acute shortness of breath, weight gain   Total encounter time more than 25 minutes  Greater than 50% was spent in counseling and coordination of care with the patient    No orders of the defined types were placed in this encounter.    Signed, Dossie Arbour, M.D., Ph.D. 01/22/2021  [facility] [place], [place] [phone]

## 2021-01-25 ENCOUNTER — Ambulatory Visit: Payer: Medicare Other | Admitting: Cardiovascular Disease

## 2021-01-25 DIAGNOSIS — R600 Localized edema: Secondary | ICD-10-CM

## 2021-01-25 DIAGNOSIS — I272 Pulmonary hypertension, unspecified: Secondary | ICD-10-CM

## 2021-01-25 DIAGNOSIS — I5032 Chronic diastolic (congestive) heart failure: Secondary | ICD-10-CM

## 2021-01-25 DIAGNOSIS — I495 Sick sinus syndrome: Secondary | ICD-10-CM

## 2021-01-25 DIAGNOSIS — I639 Cerebral infarction, unspecified: Secondary | ICD-10-CM

## 2021-01-25 DIAGNOSIS — I4821 Permanent atrial fibrillation: Secondary | ICD-10-CM

## 2021-01-25 DIAGNOSIS — I059 Rheumatic mitral valve disease, unspecified: Secondary | ICD-10-CM

## 2021-01-25 DIAGNOSIS — I10 Essential (primary) hypertension: Secondary | ICD-10-CM

## 2021-03-17 ENCOUNTER — Ambulatory Visit: Payer: Medicare Other | Admitting: Cardiovascular Disease

## 2021-04-01 ENCOUNTER — Telehealth: Payer: Self-pay | Admitting: Internal Medicine

## 2021-04-01 NOTE — Telephone Encounter (Signed)
Spoke with pt's daughter,  Pt currently hospitalized at [facility], potentially going to rehab soon.  Pt missed last transmission.  Advised we don't need to reschedule it as pt is scheduled to see Dr. Graciela Husbands on 7/17.

## 2021-04-01 NOTE — Telephone Encounter (Signed)
Patient calling states that she missed her remote check due to hospitalization Please call to reschedule if needed

## 2021-04-05 ENCOUNTER — Ambulatory Visit: Payer: Medicare Other | Admitting: Family

## 2021-04-07 ENCOUNTER — Ambulatory Visit: Payer: Medicare Other | Admitting: Family

## 2021-04-13 ENCOUNTER — Ambulatory Visit (INDEPENDENT_AMBULATORY_CARE_PROVIDER_SITE_OTHER): Payer: Medicare Other | Admitting: Internal Medicine

## 2021-04-13 ENCOUNTER — Encounter: Payer: Self-pay | Admitting: Internal Medicine

## 2021-04-13 ENCOUNTER — Other Ambulatory Visit: Payer: Self-pay

## 2021-04-13 DIAGNOSIS — I495 Sick sinus syndrome: Secondary | ICD-10-CM | POA: Diagnosis not present

## 2021-04-13 NOTE — Patient Instructions (Signed)

## 2021-04-13 NOTE — Progress Notes (Signed)
Patient ID: Rhonda Griffith, female   DOB: 08-04-1946, 75 y.o.   MRN: [id number]       Patient Care Team: [name], MD as PCP - General (Family Medicine) Mariah Milling Tollie Pizza, MD as PCP - Cardiology (Cardiology) Antonieta Iba, MD as Consulting Physician (Cardiology) Duke Salvia, MD as Consulting Physician (Cardiology) Iran Ouch, MD as Consulting Physician (Cardiology)   HPI  Rhonda Griffith is a 75 y.o. female Former patient of Dr. Leonia Reeves who underwent single chamber pacemaker implantation 2000 for symptomatic bradycardia in the context of permanent atrial fibrillation. Gen rep 3/09 with RV lead revision; gen change 2019  History of mitral  valve disease s/p bioprosthetic mitral valve replacement.    Intercurrent hx notable for stroke and was changed from apixoban to Rivaroxaban at [facility]>>coumadin  No bleeding   She is semiambulatory but limited by severe leg pain     Thromboembolic risk factors ( age  -2, HTN-1, TIA/CVA-2, Vasc disease -1, Gender-1) for a CHADSVASc Score of >=7   Recent fall, hospitalized at [facility] Before her fall on 6/22 she was using an assistive device to ambulate around her house She notes she was in the fridge in her home trying to get an item out the back of her fridge and fell , hit her neck and head then went to ER.  Now is in rehab, trying to get back to home.  She has been living with her daughter and son-in-law following the death of her husband.  Stays in her room.  Lonely.  Discouraged.  Struggles with being dependent   Today,the patient denies chest pain, shortness of breath, nocturnal dyspnea, orthopnea or peripheral edema.  There have been no palpitation, lightheadedness or syncope. [name]    DATE TEST EF%   1/16    Echo  69 % Moderate LAE   4/18    Echo  60 % LVH/ Severe LAE 52/2.6/73-- nl MV function  1/22 Echo  55-60%/ 60-65%(10/16/20)           Date Cr K Mg Hgb  12/16 1.27 3.4  11.9  6/18 1.0 3.3 1.6     12/18 1.2 4.3 2.1   11/19 1.57 4.2  13.9  12/21 1.08 4.1  10.3  1/22 1.05 4.5  9.6  7/22(CE) 1.1 4.3  10.4    Past Medical History:  Diagnosis Date   Atrial fibrillation, permanent (HCC)    a. CHA2DS2VASc = 4-->coumadin;  b. 09/2014 Echo: EF 55-60%, normal wall motion, mild AI/AS, nl MV, mod dil LA, mildly dil RA, mild to mod TR, PASP .   CHF (congestive heart failure) (HCC)    Chronic kidney disease, stage I    CVA (cerebral infarction)    a. 04/2015 - anticoagulation switched from eliquis to xarelto to coumadin.   Hypertension    Leukocytosis    Pacemaker -Medtronic    a. implant for SSS   PAD (peripheral artery disease) (HCC)    a. w/ left lower ext claudication s/p ABI's 04/2015 showing nl ABI on Right with abnl waveforms on left sugg of L SFA dzs.   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    Past Surgical History:  Procedure Laterality Date   APPENDECTOMY     bladder injection     botox bladder     CHOLECYSTECTOMY     COLONOSCOPY WITH PROPOFOL  N/A 09/11/2020   Procedure: COLONOSCOPY WITH PROPOFOL;  Surgeon: Midge Minium, MD;  Location: [facility] ENDOSCOPY;  Service: Endoscopy;  Laterality: N/A;   HIP ARTHROPLASTY Right 04/13/2019   Procedure: ARTHROPLASTY BIPOLAR HIP (HEMIARTHROPLASTY);  Surgeon: Donato Heinz, MD;  Location: [facility] ORS;  Service: Orthopedics;  Laterality: Right;   INSERT / REPLACE / REMOVE PACEMAKER     implant for SSS   MELANOMA EXCISION     face   MITRAL VALVE REPLACEMENT     s/p mechanical valve replaced with bovine valve 2010   NASAL SINUS SURGERY     PPM GENERATOR CHANGEOUT N/A 09/21/2018   Procedure: PPM GENERATOR CHANGEOUT;  Surgeon: [facility] Salvia, MD;  Location[facility] INVASIVE CV LAB;  Service: Cardiovascular;  Laterality: N/A;   TONSILLECTOMY      Current Outpatient Medications  Medication Sig Dispense Refill   acetaminophen (TYLENOL) 650 MG CR  tablet Take 650 mg by mouth every 8 (eight) hours as needed for pain.     albuterol (VENTOLIN HFA) 108 (90 Base) MCG/ACT inhaler Inhale 2 puffs into the lungs every 4 (four) hours as needed for wheezing or shortness of breath.     alendronate (FOSAMAX) 70 MG tablet Take 70 mg by mouth once a week.      ascorbic acid (VITAMIN C) 500 MG tablet Take 500 mg by mouth daily.     atorvastatin (LIPITOR) 80 MG tablet Take 80 mg by mouth every evening.      benzonatate (TESSALON) 100 MG capsule Take 100 mg by mouth 3 (three) times daily.     Calcium Carb-Cholecalciferol (OYSTER SHELL CALCIUM) 500-400 MG-UNIT TABS Take 1 tablet by mouth 2 (two) times daily.     Cholecalciferol (VITAMIN D3) 1000 UNITS CAPS Take 2,000 Units by mouth every 30 (thirty) days.      clobetasol cream (TEMOVATE) 0.05 % Apply 1 application topically 2 (two) times daily.     cyclobenzaprine (FLEXERIL) 5 MG tablet Take 5 mg by mouth 2 (two) times daily as needed for muscle spasms.     diltiazem (CARDIZEM CD) 120 MG 24 hr capsule Take 1 capsule (120 mg total) by mouth daily. 90 capsule 3   docusate sodium (COLACE) 100 MG capsule Take 100 mg by mouth daily as needed for mild constipation.     DULoxetine (CYMBALTA) 60 MG capsule Take 1 capsule (60 mg total) by mouth daily. 90 each 0   FLUoxetine (PROZAC) 40 MG capsule Take 40 mg by mouth at bedtime.     fluticasone (FLONASE) 50 MCG/ACT nasal spray 2 sprays by Each Nare route daily as needed for allergies.     gabapentin (NEURONTIN) 100 MG capsule Take 100 mg by mouth 2 (two) times daily.     guaiFENesin (MUCINEX) 600 MG 12 hr tablet Take 600 mg by mouth 2 (two) times daily as needed for cough or to loosen phlegm.     magnesium oxide (MAG-OX) 400 MG tablet Take 400 mg by mouth daily.      pantoprazole (PROTONIX) 40 MG tablet Take 40 mg by mouth daily.      rOPINIRole (REQUIP) 2 MG tablet Take 2 mg by mouth at bedtime.      warfarin (COUMADIN) 2.5 MG tablet Take 1 tablet (2.5 mg total) by  mouth daily. 30 tablet 0   No current facility-administered medications for this visit.    Allergies  Allergen Reactions   Cephalexin Rash   Latex Rash   Morphine Nausea And Vomiting   Tizanidine  Palpitations   Liothyronine Other (See Comments)   Other Other (See Comments)    Several different statins.   Statins Other (See Comments)    Several different statins.   Tape Other (See Comments)    Breaks her skin Allergic to plastic/latex tape. Only use paper tape on patient. Breaks her skin: Allergic to plastic/latex tape. Only use paper tape on patient.   Tapentadol Other (See Comments)   Sulfa Antibiotics Rash and Other (See Comments)   Review of Systems negative except from HPI and PMH  Physical Exam: BP (!) 144/70 (BP Location: Right Arm, Patient Position: Sitting, Cuff Size: Normal)   Pulse 71   Ht 5\' 9"  (1.753 m)   Wt 147 lb (66.7 kg)   SpO2 97%   BMI 21.71 kg/m  Well developed and well nourished in no acute distress sitting in a wheelchair HENT normal Neck supple with JVP-flat Lungs Clear Device pocket well healed; without hematoma or erythema.  There is no tethering  Regular rate and rhythm, no  gallop 2/6 murmur Abd-soft with active BS No Clubbing cyanosis No edema Skin-warm and dry A & Oriented  Grossly normal sensory and motor function  ECG atrial fibrillation with complete heart block and ventricular pacing at 71     Assessment and plan  Atrial fibrillation -permanent  Bradycardia  Pacemaker-Medtronic with lead replacement    History of mitral valve replacement, mechanical followed by bioprosthetic most recently 2010  Stroke  Hypertension    On anticoagulation with warfarin.  Continue INR monitoring  Lengthy discussion regarding importance of ambulatory supports to protect against falls, grief at the loss of her husband and independence     I,Stephanie Williams,acting as a scribe for 2011, MD.,have documented all relevant  documentation on the behalf of Sherryl Manges, MD,as directed by  Sherryl Manges, MD while in the presence of Sherryl Manges, MD.   I, Sherryl Manges, MD, have reviewed all documentation for this visit. The documentation on 04/13/21 for the exam, diagnosis, procedures, and orders are all accurate and complete.

## 2021-04-19 ENCOUNTER — Ambulatory Visit (INDEPENDENT_AMBULATORY_CARE_PROVIDER_SITE_OTHER): Payer: Medicare Other

## 2021-04-19 DIAGNOSIS — I495 Sick sinus syndrome: Secondary | ICD-10-CM

## 2021-04-20 ENCOUNTER — Ambulatory Visit: Payer: Medicare Other | Admitting: Cardiovascular Disease

## 2021-04-20 LAB — CUP PACEART REMOTE DEVICE CHECK
Battery Remaining Longevity: 117 mo
Battery Voltage: 3.01 V
Brady Statistic RV Percent Paced: 98.28 %
Date Time Interrogation Session: 20220723151425
Implantable Lead Implant Date: 20090317
Implantable Lead Location: 753860
Implantable Lead Model: 5076
Implantable Pulse Generator Implant Date: 20191227
Lead Channel Impedance Value: 399 Ohm
Lead Channel Impedance Value: 456 Ohm
Lead Channel Pacing Threshold Amplitude: 0.625 V
Lead Channel Pacing Threshold Pulse Width: 0.4 ms
Lead Channel Sensing Intrinsic Amplitude: 6.875 mV
Lead Channel Sensing Intrinsic Amplitude: 6.875 mV
Lead Channel Setting Pacing Amplitude: 2.5 V
Lead Channel Setting Pacing Pulse Width: 0.4 ms
Lead Channel Setting Sensing Sensitivity: 1.2 mV

## 2021-05-06 ENCOUNTER — Ambulatory Visit: Payer: Medicare Other | Admitting: Family

## 2021-05-14 NOTE — Progress Notes (Signed)
Remote pacemaker transmission.   

## 2021-06-07 NOTE — Progress Notes (Signed)
Cardiology Office Note  Date:  06/08/2021   ID:  Rhonda Griffith, Goodell Aug 06, 1946, MRN [id number]  PCP:  [name], MD   Chief Complaint  Patient presents with   6 month follow up     Patient c/o LE edema and shortness of breath. Medications reviewed by the patient verbally.     HPI:  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 Graciela Husbands 03/2021  CVA , changed to warfarin, per the daughter, "been on so long", "comfortable knowing number"  Recent fall, hospitalized at [facility]  Before her fall on 6/22 she was using an assistive device to ambulate around her house Hit chin on fridge Required rehab She has been living with her daughter and son-in-law following the death of her husband.    PT coming the house, walking 2x a day Stays in her room.  Watches TV Ipad, games Lots of time in the wheelchair  No edema,  Lasix as needed  Echo 09/2020: no stenosis of mitral valve  EKG personally reviewed by myself on todays visit Paced rhythm 75 bpm  Other past medical history reviewed  in hospital, 04/27/2020 Woke up with SOB, ankle swelling  CR 1.34, BUN 28 Dry weight now 162 to 165, Taking lasix 20 daily, no recent BMP  History of heavy vaginal bleeding, none recently Chronic left lower extremity toe drop, foot drop.   Statin was held in the past for leg pain, Zocor.   Previous spinal [facility] for high pressure in her ventricle. She reports no significant improvement after this  procedure (drain?).   Placement of a bladder stimulator implant for overactive bladder was denied despite cardiac clearance .   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  PSH:    Past Surgical History:  Procedure Laterality Date   APPENDECTOMY     bladder injection     botox bladder     CHOLECYSTECTOMY     COLONOSCOPY WITH PROPOFOL N/A 09/11/2020   Procedure: COLONOSCOPY WITH PROPOFOL;  Surgeon: Midge Minium, MD;  Location: [facility] ENDOSCOPY;  Service: Endoscopy;  Laterality: N/A;   HIP ARTHROPLASTY Right 04/13/2019   Procedure: ARTHROPLASTY BIPOLAR HIP (HEMIARTHROPLASTY);  Surgeon: Donato Heinz, MD;  Location: [facility] ORS;  Service: Orthopedics;  Laterality: Right;   INSERT / REPLACE / REMOVE PACEMAKER     implant for SSS   MELANOMA EXCISION     face   MITRAL VALVE REPLACEMENT     s/p mechanical valve replaced with bovine valve 2010   NASAL SINUS SURGERY     PPM GENERATOR CHANGEOUT N/A 09/21/2018   Procedure: PPM GENERATOR CHANGEOUT;  Surgeon: Duke Salvia, MD;  Location: [facility] INVASIVE CV LAB;  Service: Cardiovascular;  Laterality: N/A;   TONSILLECTOMY      Current Outpatient  Medications  Medication Sig Dispense Refill   acetaminophen (TYLENOL) 650 MG CR tablet Take 650 mg by mouth every 8 (eight) hours as needed for pain.     albuterol (VENTOLIN HFA) 108 (90 Base) MCG/ACT inhaler Inhale 2 puffs into the lungs every 4 (four) hours as needed for wheezing or shortness of breath.     alendronate (FOSAMAX) 70 MG tablet Take 70 mg by mouth once a week.      ascorbic acid (VITAMIN C) 500 MG tablet Take 500 mg by mouth daily.     Calcium Carb-Cholecalciferol (OYSTER SHELL CALCIUM) 500-400 MG-UNIT  TABS Take 1 tablet by mouth 2 (two) times daily.     Cholecalciferol (VITAMIN D3) 1000 UNITS CAPS Take 2,000 Units by mouth every 30 (thirty) days.      clobetasol cream (TEMOVATE) 0.05 % Apply 1 application topically 2 (two) times daily.     cyclobenzaprine (FLEXERIL) 5 MG tablet Take 5 mg by mouth 2 (two) times daily as needed for muscle spasms.     diazepam (VALIUM) 5 MG tablet Take 2.5 mg by mouth daily as needed.     docusate sodium (COLACE) 100 MG capsule Take 100 mg by mouth daily as needed for mild constipation.     ferrous sulfate 325 (65 FE) MG tablet Take 325 mg by mouth 3 (three) times a week.     FLUoxetine (PROZAC) 40 MG capsule Take 40 mg by mouth at bedtime.     fluticasone (FLONASE) 50 MCG/ACT nasal spray 2 sprays by Each Nare route daily as needed for allergies.     folic acid (FOLVITE) 1 MG tablet Take 1 mg by mouth daily.     gabapentin (NEURONTIN) 100 MG capsule Take 100 mg by mouth 2 (two) times daily.     magnesium oxide (MAG-OX) 400 MG tablet Take 400 mg by mouth daily.      memantine (NAMENDA) 5 MG tablet Take 5 mg by mouth 2 (two) times daily.     oxyCODONE-acetaminophen (PERCOCET/ROXICET) 5-325 MG tablet Take 0.5 tablets by mouth 4 (four) times daily as needed.     pantoprazole (PROTONIX) 40 MG tablet Take 40 mg by mouth daily.      rOPINIRole (REQUIP) 2 MG tablet Take 2 mg by mouth at bedtime.      warfarin (COUMADIN) 2.5 MG tablet Take 1 tablet (2.5 mg total) by mouth daily. 30 tablet 0   atorvastatin (LIPITOR) 80 MG tablet Take 1 tablet (80 mg total) by mouth every evening. 90 tablet 3   benzonatate (TESSALON) 100 MG capsule Take 100 mg by mouth 3 (three) times daily. (Patient not taking: Reported on 06/08/2021)     diltiazem (CARDIZEM CD) 120 MG 24 hr capsule Take 1 capsule (120 mg total) by mouth daily. 90 capsule 3   DULoxetine (CYMBALTA) 60 MG capsule Take 1 capsule (60 mg total) by mouth daily. (Patient not taking: Reported on 06/08/2021) 90 each 0   guaiFENesin  (MUCINEX) 600 MG 12 hr tablet Take 600 mg by mouth 2 (two) times daily as needed for cough or to loosen phlegm. (Patient not taking: Reported on 06/08/2021)     No current facility-administered medications for this visit.     Allergies:   Cephalexin, Latex, Morphine, Tizanidine, Liothyronine, Other, Statins, Tape, Tapentadol, and Sulfa antibiotics   Social History:  The patient  reports that she quit smoking about 50 years ago. Her smoking use included cigarettes. She has a 5.00 pack-year smoking history. She has never used  smokeless tobacco. She reports that she does not drink alcohol and does not use drugs.   Family History:   family history includes Hypertension in an other family member.    Review of Systems: Review of Systems  Constitutional: Negative.   HENT: Negative.    Respiratory: Negative.    Cardiovascular: Negative.   Gastrointestinal: Negative.   Musculoskeletal:  Positive for joint pain.       Gait instability  Neurological: Negative.        Burning of feet  Psychiatric/Behavioral: Negative.    All other systems reviewed and are negative.   PHYSICAL EXAM: VS:  BP 104/60 (BP Location: Left Arm, Patient Position: Sitting, Cuff Size: Normal)   Pulse 75   Ht 5\' 9"  (1.753 m)   Wt 146 lb 4 oz (66.3 kg)   SpO2 96%   BMI 21.60 kg/m  , BMI Body mass index is 21.6 kg/m. Constitutional:  oriented to person, place, and time. No distress.  HENT:  Head: Grossly normal Eyes:  no discharge. No scleral icterus.  Neck: No JVD, no carotid bruits  Cardiovascular: Regular rate and rhythm, no murmurs appreciated Pulmonary/Chest: Clear to auscultation bilaterally, no wheezes or rails Abdominal: Soft.  no distension.  no tenderness.  Musculoskeletal: Normal range of motion Neurological:  normal muscle tone. Coordination normal. No atrophy Skin: Skin warm and dry Psychiatric: normal affect, pleasant   Recent Labs: 10/01/2020: ALT 14; B Natriuretic Peptide 258.8 10/05/2020: BUN  39; Creatinine, Ser 1.05; Hemoglobin 9.6; Magnesium 2.1; Platelets 260; Potassium 4.5; Sodium 136    Lipid Panel No results found for: CHOL, HDL, LDLCALC, TRIG    Wt Readings from Last 3 Encounters:  06/08/21 146 lb 4 oz (66.3 kg)  04/13/21 147 lb (66.7 kg)  10/08/20 168 lb (76.2 kg)     ASSESSMENT AND PLAN:  Atrial fibrillation, unspecified type (HCC) - Plan: EKG 12-Lead On anticoagulation,  paced rhythm Followed by Dr. 10/10/20 stable  Chronic diastolic heart failure (HCC) - Plan: EKG 12-Lead Lasix PRN Euvolemic, weight down 20 pounds  Essential hypertension - Plan: EKG 12-Lead Blood pressure is well controlled on today's visit. No changes made to the medications.  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 06/08/2021  [facility] [place], [place]

## 2021-06-08 ENCOUNTER — Ambulatory Visit (INDEPENDENT_AMBULATORY_CARE_PROVIDER_SITE_OTHER): Payer: Medicare Other | Admitting: Cardiovascular Disease

## 2021-06-08 ENCOUNTER — Other Ambulatory Visit: Payer: Self-pay

## 2021-06-08 ENCOUNTER — Encounter: Payer: Self-pay | Admitting: Cardiovascular Disease

## 2021-06-08 VITALS — BP 104/60 | HR 75 | Ht 69.0 in | Wt 146.2 lb

## 2021-06-08 DIAGNOSIS — I5032 Chronic diastolic (congestive) heart failure: Secondary | ICD-10-CM

## 2021-06-08 DIAGNOSIS — I495 Sick sinus syndrome: Secondary | ICD-10-CM

## 2021-06-08 DIAGNOSIS — I639 Cerebral infarction, unspecified: Secondary | ICD-10-CM

## 2021-06-08 DIAGNOSIS — I4821 Permanent atrial fibrillation: Secondary | ICD-10-CM

## 2021-06-08 DIAGNOSIS — I059 Rheumatic mitral valve disease, unspecified: Secondary | ICD-10-CM

## 2021-06-08 DIAGNOSIS — R600 Localized edema: Secondary | ICD-10-CM

## 2021-06-08 DIAGNOSIS — I10 Essential (primary) hypertension: Secondary | ICD-10-CM | POA: Diagnosis not present

## 2021-06-08 DIAGNOSIS — I272 Pulmonary hypertension, unspecified: Secondary | ICD-10-CM

## 2021-06-08 MED ORDER — DILTIAZEM HCL ER COATED BEADS 120 MG PO CP24: 120.0000 mg | ORAL_CAPSULE | Freq: Every day | ORAL | 3 refills | Status: AC

## 2021-06-08 MED ORDER — ATORVASTATIN CALCIUM 80 MG PO TABS: 80.0000 mg | ORAL_TABLET | Freq: Every evening | ORAL | 3 refills | Status: AC

## 2021-06-08 NOTE — Patient Instructions (Addendum)
Medication Instructions:  No changes  If you need a refill on your cardiac medications before your next appointment, please call your pharmacy.   Lab work: No new labs needed  Testing/Procedures: No new testing needed  Follow-Up: At [facility], you and your health needs are our priority.  As part of our continuing mission to provide you with exceptional heart care, we have created designated Provider Care Teams.  These Care Teams include your primary Cardiologist (physician) and Advanced Practice Providers (APPs -  Physician Assistants and Nurse Practitioners) who all work together to provide you with the care you need, when you need it.  You will need a follow up appointment in 12 months  Providers on your designated Care Team:   Christopher Berge, NP Ryan Dunn, PA-C Jacquelyn Visser, PA-C Cadence Furth, PA-C  COVID-19 Vaccine Information can be found at: [email or web address] For questions related to vaccine distribution or appointments, please email [email or web address] or call [phone].    

## 2021-07-06 IMAGING — CR DG CHEST 2V
2 series · 3 of 3 positions shown · non-contrast
Comparison: 04/27/2020.

CLINICAL DATA: Shortness of breath.

EXAM:
CHEST - 2 VIEW

[Series 2: chest lat · 0.14mm/px · 2 of 2 slices shown]
[im 1/2]
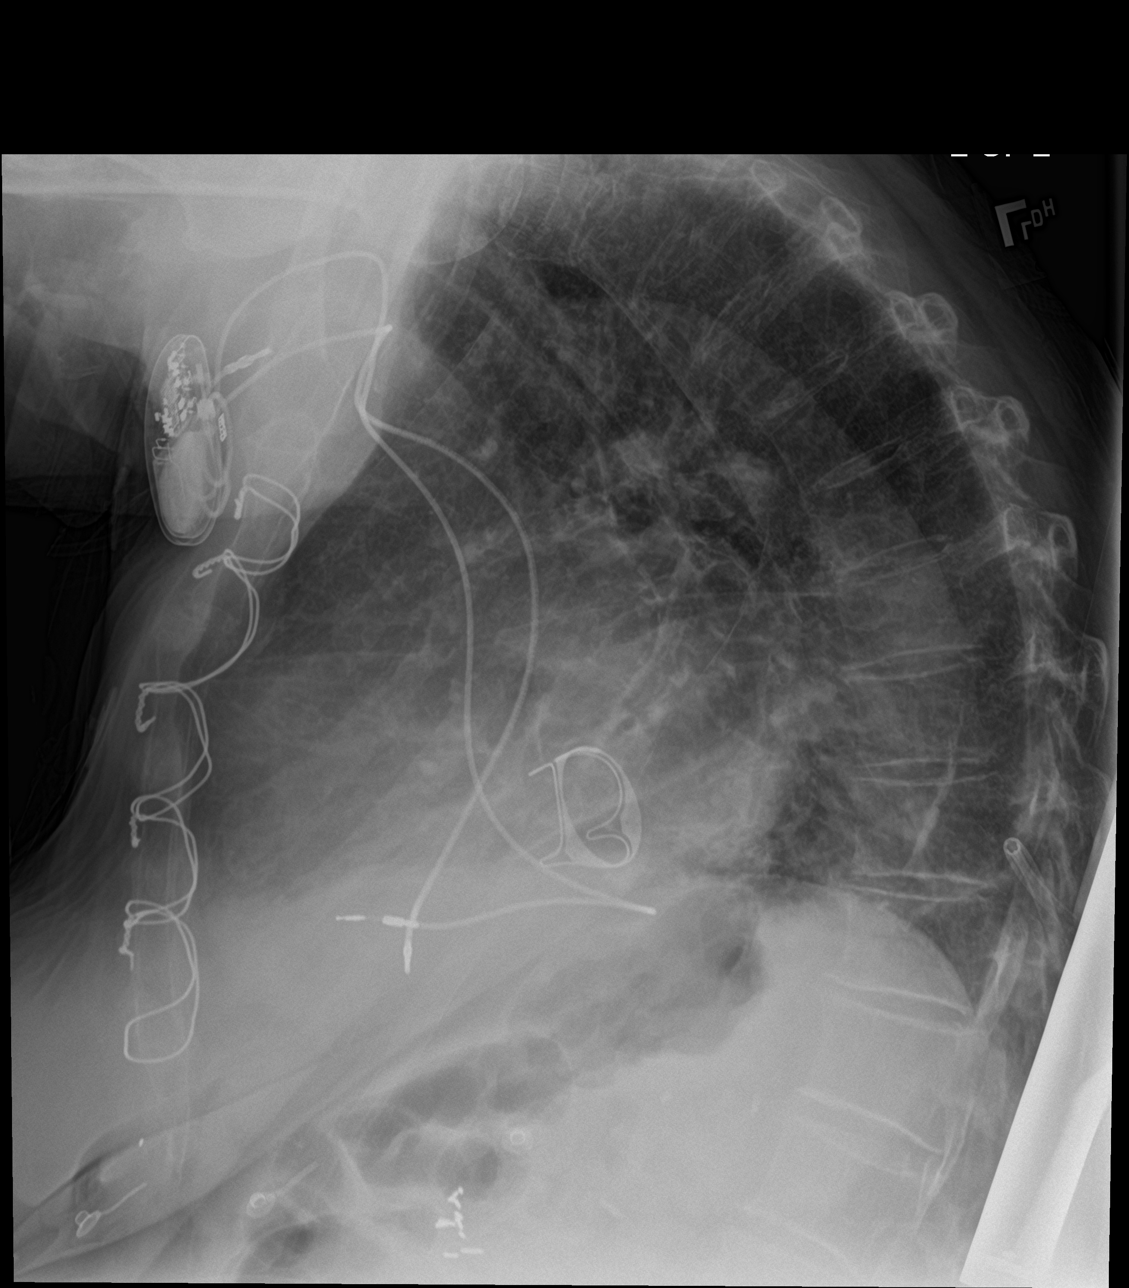
[im 2/2]
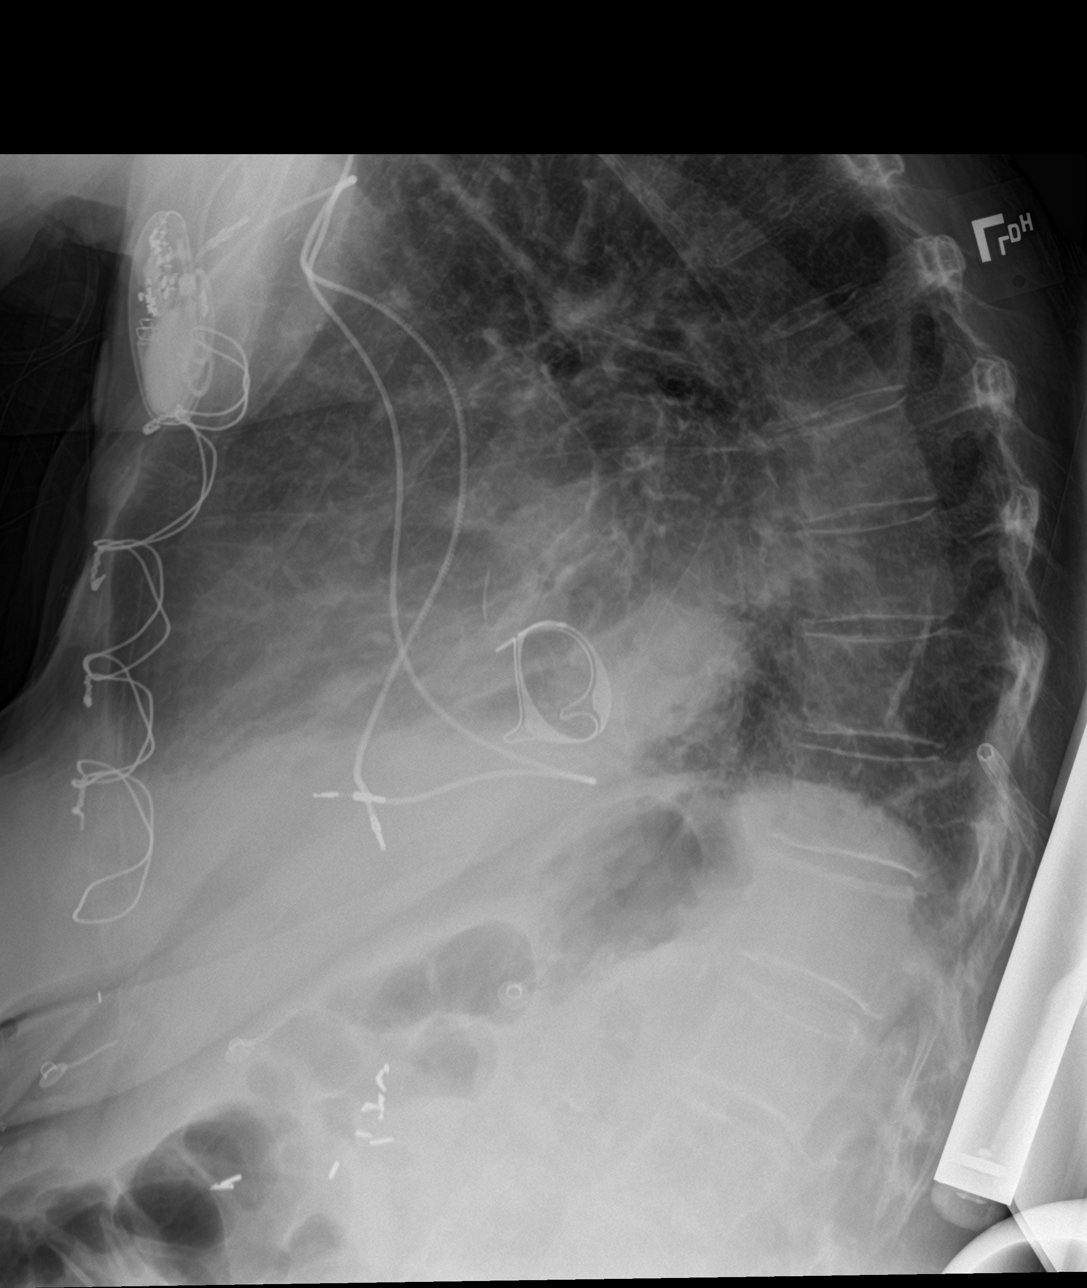

[chest ap]
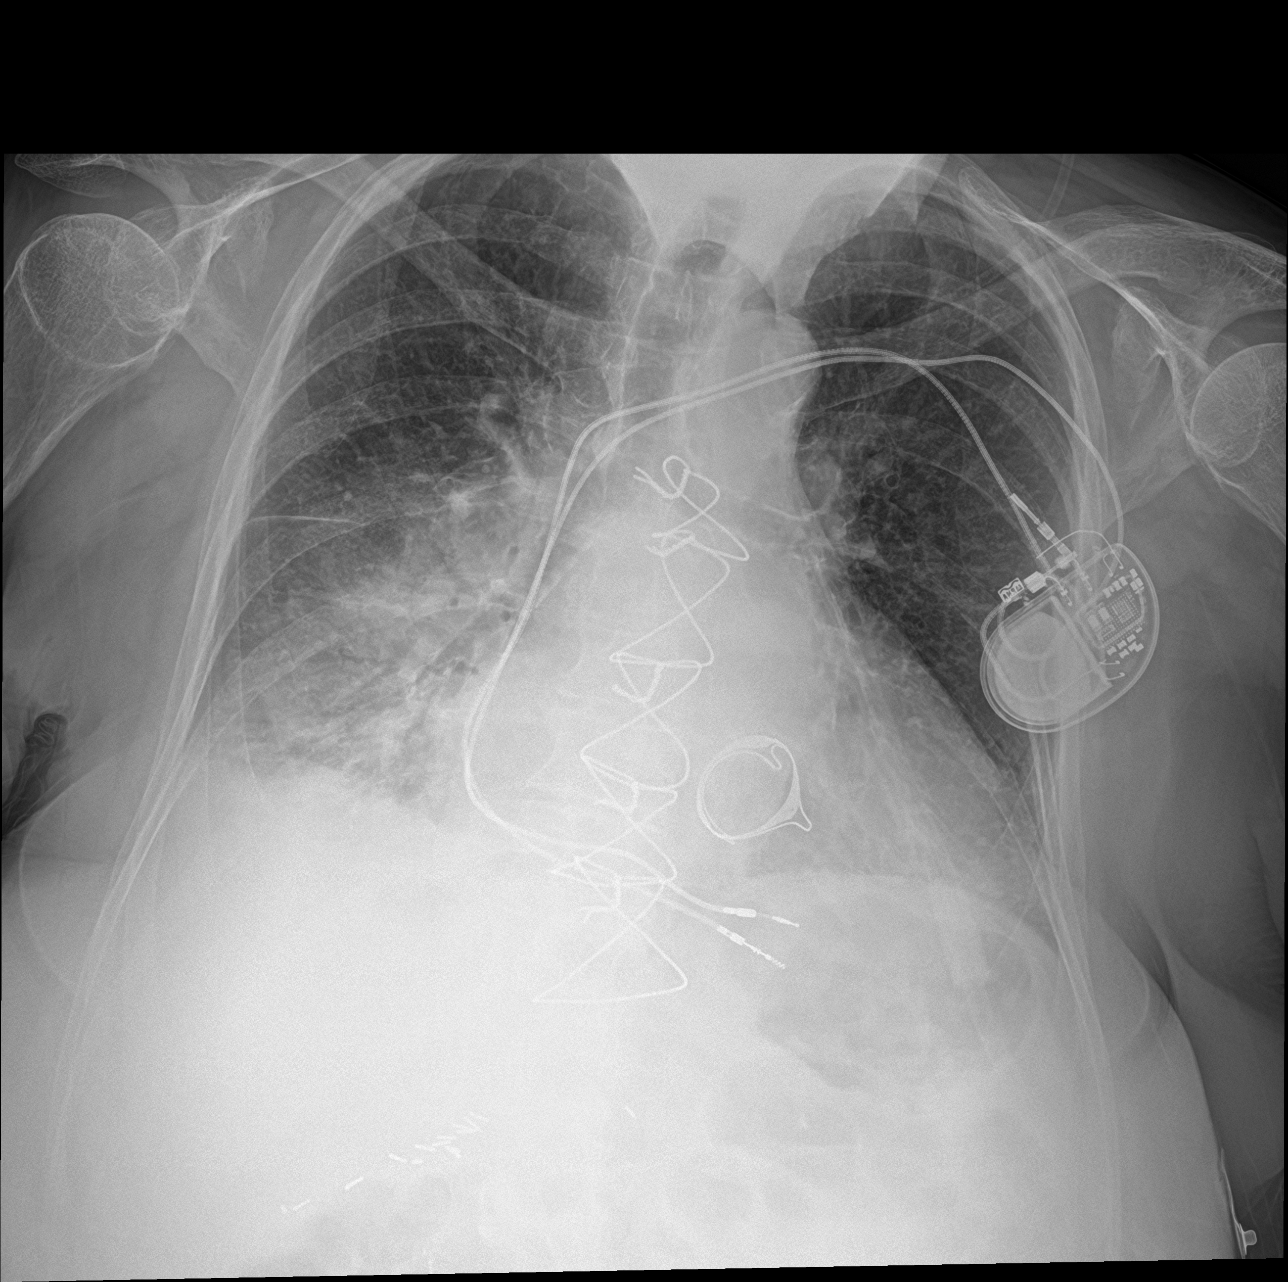

[3 of 3 positions shown; findings below may reference images not displayed]

FINDINGS: Cardiac pacer in stable position. Prior median sternotomy and
cardiac valve replacement. Cardiomegaly. Low lung volumes. Diffuse
bilateral interstitial prominence with prominent alveolar
infiltrates/edema right lung. CHF and/or pneumonia could present
this fashion. Small right pleural effusion. No pneumothorax.
Degenerative changes scoliosis thoracic spine. Surgical clips right
upper quadrant.
IMPRESSION: 1. Cardiac pacer in stable position. Prior median sternotomy and
cardiac valve replacement. Cardiomegaly.
2. Low lung volumes. Diffuse bilateral interstitial prominence with
prominent alveolar infiltrates/edema right lung. CHF and/or
pneumonia could present in this fashion.

## 2021-07-07 IMAGING — US US RENAL
1 series · 14 of 25 positions shown · non-contrast
Comparison: None.

CLINICAL DATA: Acute renal insufficiency

EXAM:
RENAL / URINARY TRACT ULTRASOUND COMPLETE

[Series 1: us renal · 14 of 44 slices shown]
[im 1/44]
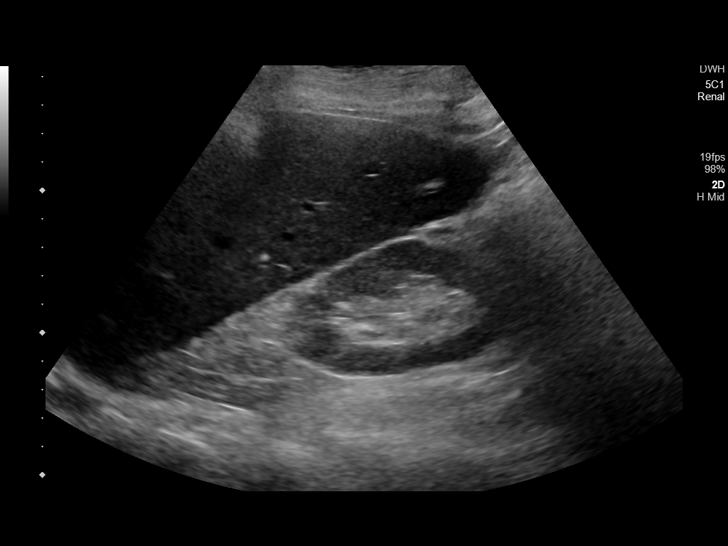
[im 4/44]
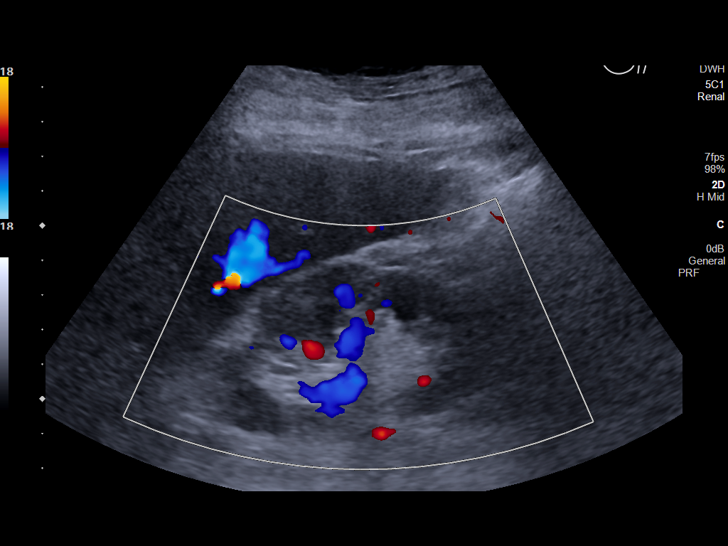
[im 8/44]
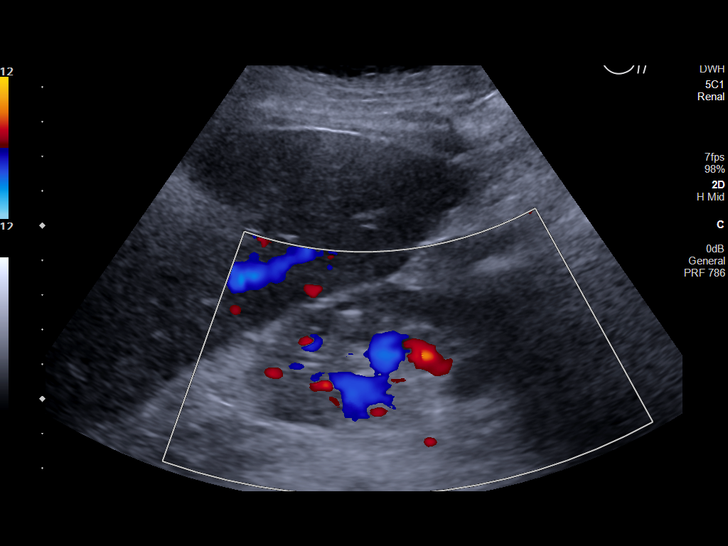
[im 11/44]
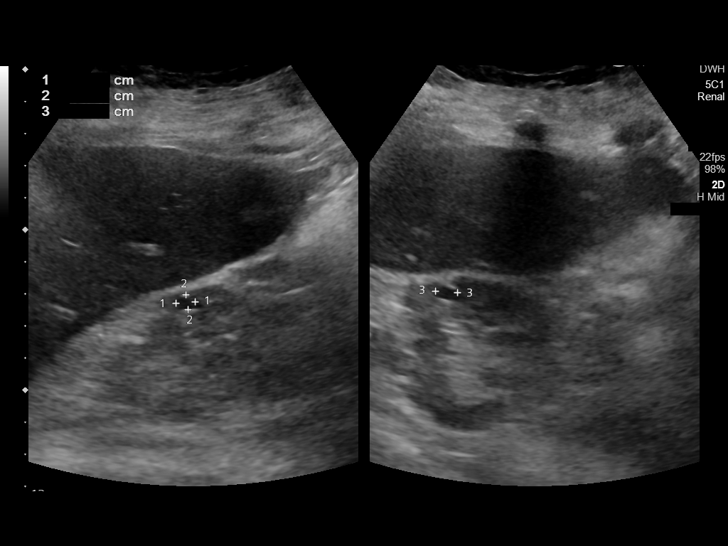
[im 15/44]
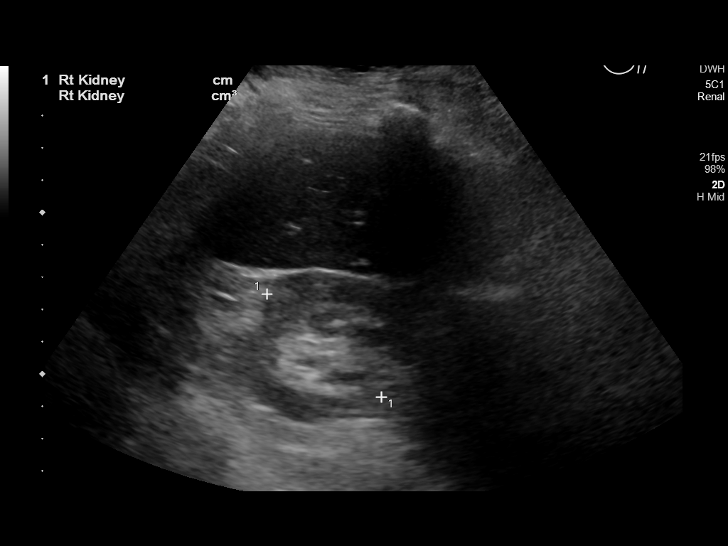
[im 17/44]
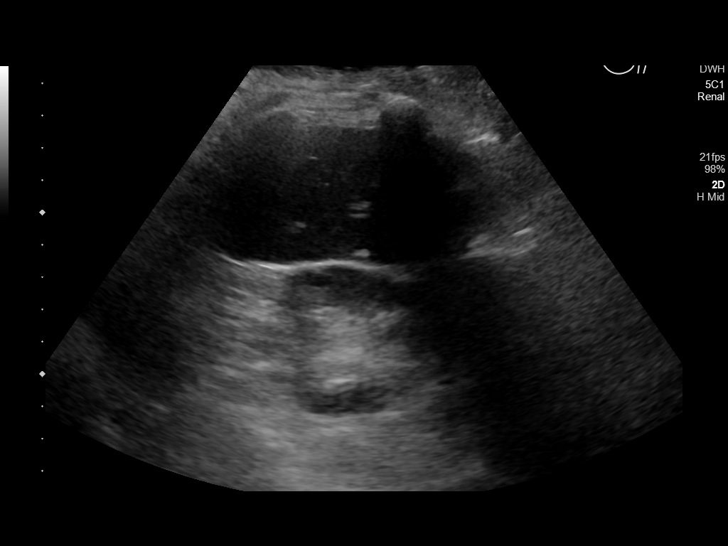
[im 20/44]
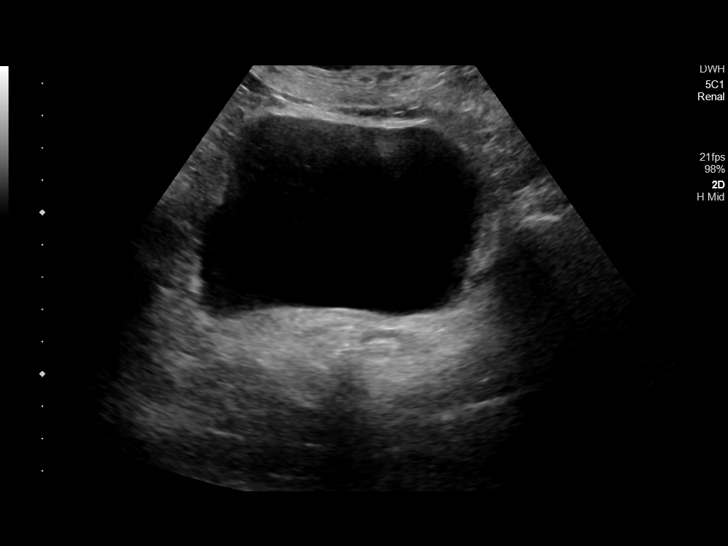
[im 24/44]
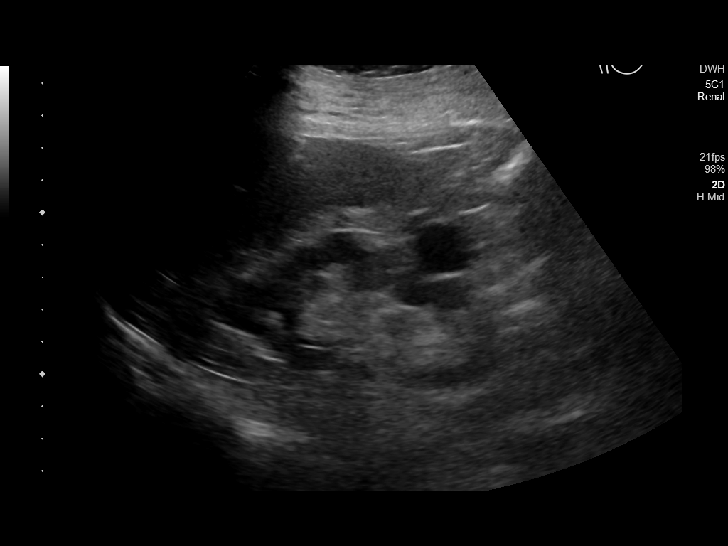
[im 27/44]
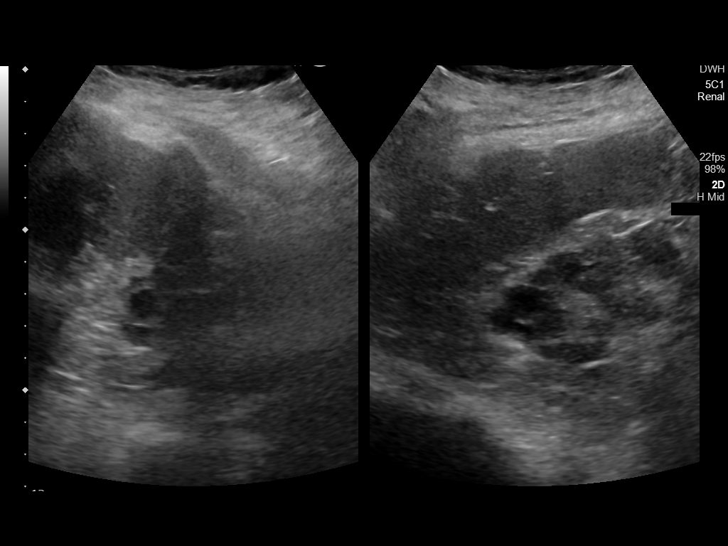
[im 29/44]
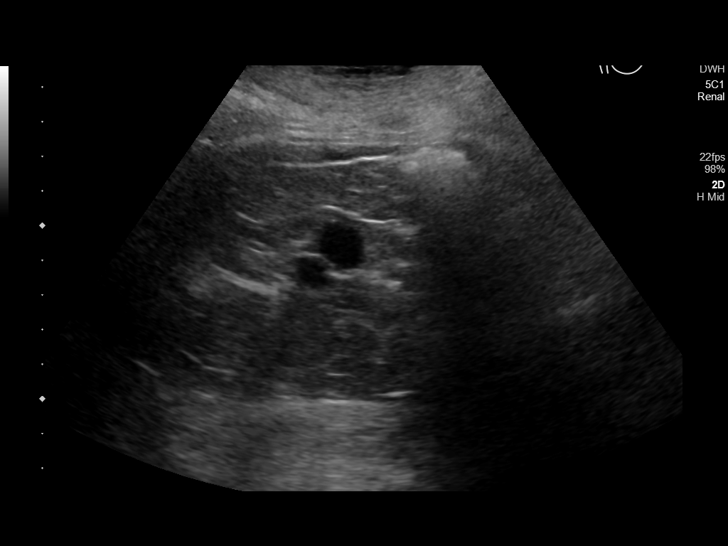
[im 33/44]
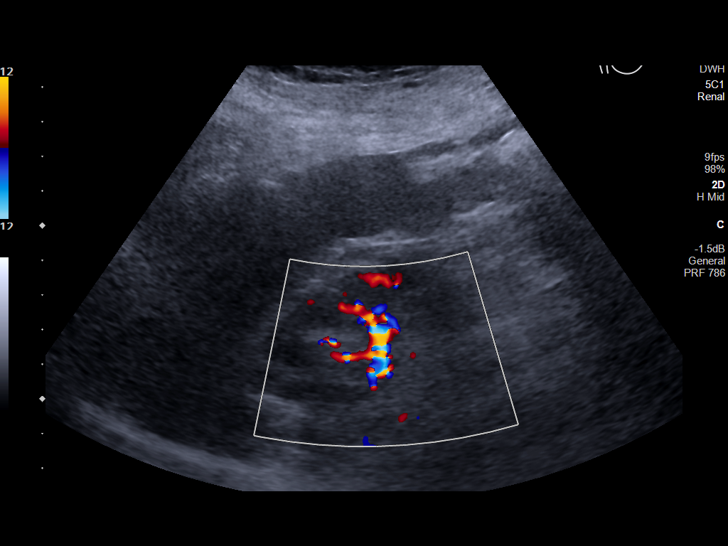
[im 36/44]
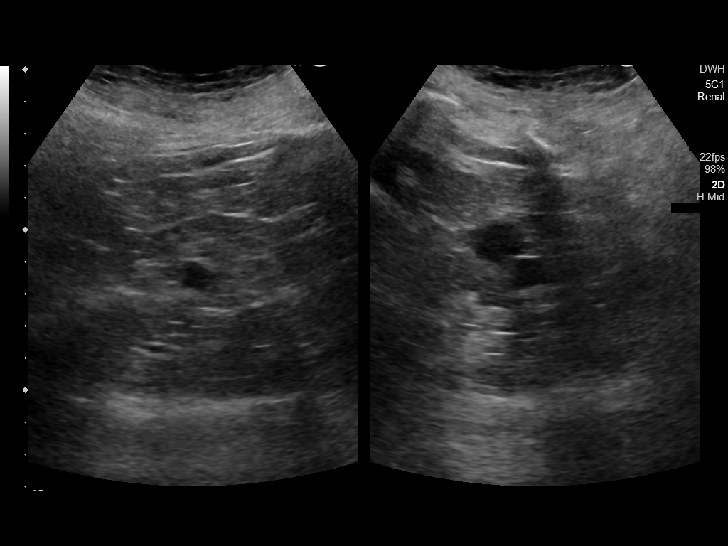
[im 40/44]
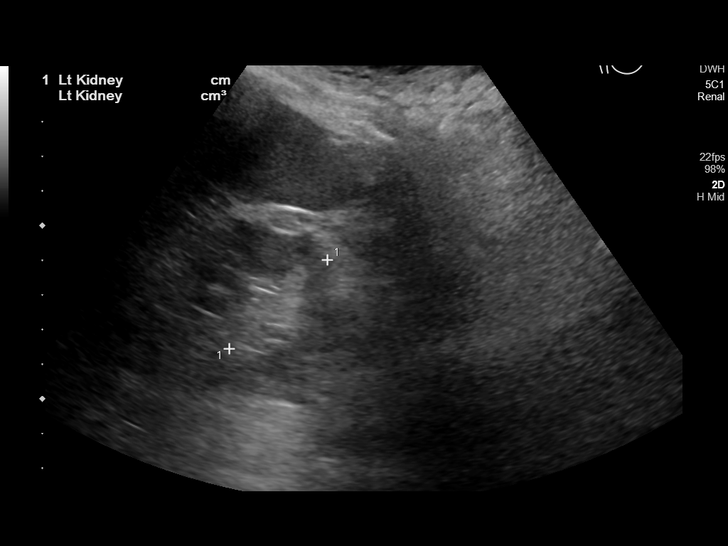
[im 44/44]
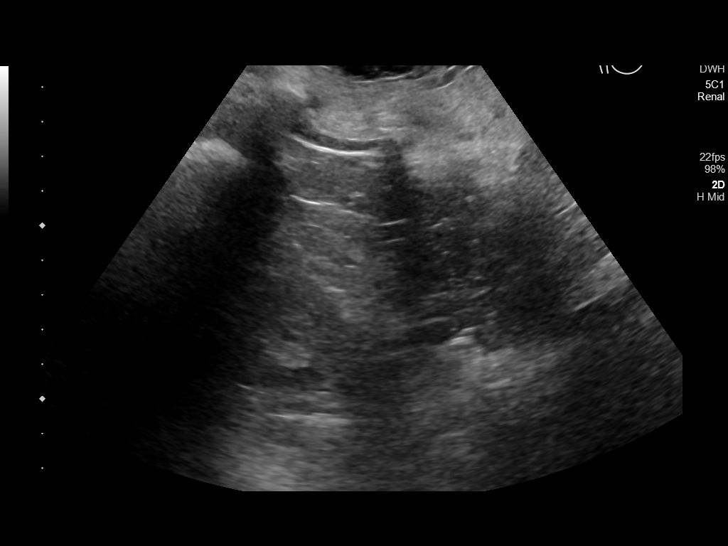

[14 of 25 positions shown; findings below may reference images not displayed]

FINDINGS: Right Kidney:

Renal measurements: 9.4 x 4.8 x 4.8 cm = volume: 112 mL. There is
mild renal cortical atrophy noted. The renal cortical echogenicity
is within normal limits. 7 mm simple cortical cyst is seen within
the interpolar region of the right kidney.

Left Kidney:

Renal measurements: 9.2 x 4.8 x 3.8 cm = volume: 89 mL. There is
mild slightly asymmetric cortical atrophy of the left kidney. Renal
parenchymal echogenicity is within normal limits. Multiple simple
exophytic cortical cysts are seen arising from the left kidney
measuring up to 1.9 cm.

Bladder:

Appears normal for degree of bladder distention. Bilateral ureteral
jets are identified.

Other:

None.
IMPRESSION: Bilateral renal cortical atrophy.  No hydronephrosis.

## 2021-07-19 ENCOUNTER — Ambulatory Visit (INDEPENDENT_AMBULATORY_CARE_PROVIDER_SITE_OTHER): Payer: Medicare Other

## 2021-07-19 DIAGNOSIS — I495 Sick sinus syndrome: Secondary | ICD-10-CM

## 2021-07-20 LAB — CUP PACEART REMOTE DEVICE CHECK
Battery Remaining Longevity: 111 mo
Battery Voltage: 3.01 V
Brady Statistic RV Percent Paced: 97.08 %
Date Time Interrogation Session: 20221024051233
Implantable Lead Implant Date: 20090317
Implantable Lead Location: 753860
Implantable Lead Model: 5076
Implantable Pulse Generator Implant Date: 20191227
Lead Channel Impedance Value: 361 Ohm
Lead Channel Impedance Value: 418 Ohm
Lead Channel Pacing Threshold Amplitude: 0.625 V
Lead Channel Pacing Threshold Pulse Width: 0.4 ms
Lead Channel Sensing Intrinsic Amplitude: 7.875 mV
Lead Channel Sensing Intrinsic Amplitude: 7.875 mV
Lead Channel Setting Pacing Amplitude: 2.5 V
Lead Channel Setting Pacing Pulse Width: 0.4 ms
Lead Channel Setting Sensing Sensitivity: 1.2 mV

## 2021-07-27 NOTE — Progress Notes (Signed)
Remote pacemaker transmission.   

## 2021-10-18 ENCOUNTER — Ambulatory Visit (INDEPENDENT_AMBULATORY_CARE_PROVIDER_SITE_OTHER): Payer: Medicare Other

## 2021-10-18 DIAGNOSIS — I495 Sick sinus syndrome: Secondary | ICD-10-CM | POA: Diagnosis not present

## 2021-10-26 LAB — CUP PACEART REMOTE DEVICE CHECK
Battery Remaining Longevity: 108 mo
Battery Voltage: 3.01 V
Brady Statistic RV Percent Paced: 90.33 %
Date Time Interrogation Session: 20230130194520
Implantable Lead Implant Date: 20090317
Implantable Lead Location: 753860
Implantable Lead Model: 5076
Implantable Pulse Generator Implant Date: 20191227
Lead Channel Impedance Value: 361 Ohm
Lead Channel Impedance Value: 418 Ohm
Lead Channel Pacing Threshold Amplitude: 0.5 V
Lead Channel Pacing Threshold Pulse Width: 0.4 ms
Lead Channel Sensing Intrinsic Amplitude: 7.625 mV
Lead Channel Sensing Intrinsic Amplitude: 7.625 mV
Lead Channel Setting Pacing Amplitude: 2.5 V
Lead Channel Setting Pacing Pulse Width: 0.4 ms
Lead Channel Setting Sensing Sensitivity: 1.2 mV

## 2021-10-29 NOTE — Progress Notes (Signed)
Remote pacemaker transmission.   

## 2022-01-17 ENCOUNTER — Telehealth: Payer: Self-pay

## 2022-01-17 NOTE — Telephone Encounter (Signed)
The patient is in the hospital and will send a transmission when she gets home. ?

## 2022-03-09 ENCOUNTER — Ambulatory Visit (INDEPENDENT_AMBULATORY_CARE_PROVIDER_SITE_OTHER): Payer: Medicare Other

## 2022-03-09 DIAGNOSIS — I495 Sick sinus syndrome: Secondary | ICD-10-CM

## 2022-03-11 LAB — CUP PACEART REMOTE DEVICE CHECK
Battery Remaining Longevity: 107 mo
Battery Voltage: 3 V
Brady Statistic RV Percent Paced: 80.37 %
Date Time Interrogation Session: 20230613183300
Implantable Lead Implant Date: 20090317
Implantable Lead Location: 753860
Implantable Lead Model: 5076
Implantable Pulse Generator Implant Date: 20191227
Lead Channel Impedance Value: 399 Ohm
Lead Channel Impedance Value: 437 Ohm
Lead Channel Pacing Threshold Amplitude: 0.75 V
Lead Channel Pacing Threshold Pulse Width: 0.4 ms
Lead Channel Sensing Intrinsic Amplitude: 8.75 mV
Lead Channel Sensing Intrinsic Amplitude: 8.75 mV
Lead Channel Setting Pacing Amplitude: 2.5 V
Lead Channel Setting Pacing Pulse Width: 0.4 ms
Lead Channel Setting Sensing Sensitivity: 1.2 mV

## 2022-06-08 ENCOUNTER — Ambulatory Visit (INDEPENDENT_AMBULATORY_CARE_PROVIDER_SITE_OTHER): Payer: Medicare Other

## 2022-06-08 DIAGNOSIS — I495 Sick sinus syndrome: Secondary | ICD-10-CM | POA: Diagnosis not present

## 2022-06-13 LAB — CUP PACEART REMOTE DEVICE CHECK
Battery Remaining Longevity: 103 mo
Battery Voltage: 3 V
Brady Statistic RV Percent Paced: 83.53 %
Date Time Interrogation Session: 20230916152540
Implantable Lead Implant Date: 20090317
Implantable Lead Location: 753860
Implantable Lead Model: 5076
Implantable Pulse Generator Implant Date: 20191227
Lead Channel Impedance Value: 380 Ohm
Lead Channel Impedance Value: 418 Ohm
Lead Channel Pacing Threshold Amplitude: 0.625 V
Lead Channel Pacing Threshold Pulse Width: 0.4 ms
Lead Channel Sensing Intrinsic Amplitude: 8 mV
Lead Channel Sensing Intrinsic Amplitude: 8 mV
Lead Channel Setting Pacing Amplitude: 2.5 V
Lead Channel Setting Pacing Pulse Width: 0.4 ms
Lead Channel Setting Sensing Sensitivity: 1.2 mV

## 2022-06-23 NOTE — Progress Notes (Signed)
Remote pacemaker transmission.   

## 2022-07-04 ENCOUNTER — Telehealth: Payer: Self-pay

## 2022-07-04 ENCOUNTER — Telehealth: Payer: Self-pay | Admitting: Cardiovascular Disease

## 2022-07-04 NOTE — Telephone Encounter (Signed)
Pt daughter called stating the patient passed away on 07-12-22. I canceled all upcoming remotes, marked her deceased in [place], and took her out of [facility]. I gave the daughter the number for [facility] tech support to order a return kit for the monitor.

## 2022-07-04 NOTE — Telephone Encounter (Signed)
I offered the daughter my deepest Condolences and let her know she should receive the return kit in 7-10 business days.

## 2022-07-04 NOTE — Telephone Encounter (Signed)
Daughter called to let Dr. Rockey Situ know patient passed away.

## 2022-07-08 NOTE — Telephone Encounter (Signed)
Called and spoke with someone, daugther was not able to be reached directly

## 2022-07-27 DEATH — deceased

## 2022-08-16 ENCOUNTER — Encounter: Payer: Medicare Other | Admitting: Internal Medicine

## 2022-08-16 ENCOUNTER — Ambulatory Visit: Payer: Medicare Other | Admitting: Cardiovascular Disease
